# Patient Record
Sex: Female | Born: 1950 | Race: White | Hispanic: No | Marital: Married | Smoking: Never smoker
Health system: Southern US, Community
[De-identification: ages and names within clinical notes are randomized; demographics above are authoritative.]

## PROBLEM LIST (undated history)

## (undated) DIAGNOSIS — Z8781 Personal history of (healed) traumatic fracture: Secondary | ICD-10-CM

## (undated) DIAGNOSIS — F32A Depression, unspecified: Secondary | ICD-10-CM

## (undated) DIAGNOSIS — M1990 Unspecified osteoarthritis, unspecified site: Secondary | ICD-10-CM

## (undated) DIAGNOSIS — G43909 Migraine, unspecified, not intractable, without status migrainosus: Secondary | ICD-10-CM

## (undated) DIAGNOSIS — G4700 Insomnia, unspecified: Secondary | ICD-10-CM

## (undated) DIAGNOSIS — F329 Major depressive disorder, single episode, unspecified: Secondary | ICD-10-CM

## (undated) DIAGNOSIS — C801 Malignant (primary) neoplasm, unspecified: Secondary | ICD-10-CM

## (undated) DIAGNOSIS — M810 Age-related osteoporosis without current pathological fracture: Secondary | ICD-10-CM

## (undated) DIAGNOSIS — F419 Anxiety disorder, unspecified: Secondary | ICD-10-CM

## (undated) DIAGNOSIS — R519 Headache, unspecified: Secondary | ICD-10-CM

## (undated) DIAGNOSIS — R51 Headache: Secondary | ICD-10-CM

## (undated) DIAGNOSIS — R569 Unspecified convulsions: Secondary | ICD-10-CM

## (undated) DIAGNOSIS — S42202A Unspecified fracture of upper end of left humerus, initial encounter for closed fracture: Secondary | ICD-10-CM

## (undated) DIAGNOSIS — K219 Gastro-esophageal reflux disease without esophagitis: Secondary | ICD-10-CM

## (undated) DIAGNOSIS — S42309A Unspecified fracture of shaft of humerus, unspecified arm, initial encounter for closed fracture: Secondary | ICD-10-CM

## (undated) HISTORY — PX: JOINT REPLACEMENT: SHX530

## (undated) HISTORY — PX: TONSILLECTOMY: SUR1361

## (undated) HISTORY — PX: FEMUR FRACTURE SURGERY: SHX633

## (undated) HISTORY — PX: ABDOMINAL HYSTERECTOMY: SHX81

---

## 1988-08-08 DIAGNOSIS — Z8781 Personal history of (healed) traumatic fracture: Secondary | ICD-10-CM

## 1988-08-08 HISTORY — DX: Personal history of (healed) traumatic fracture: Z87.81

## 2015-03-09 HISTORY — PX: ORIF DISTAL RADIUS FRACTURE: SUR927

## 2015-08-01 ENCOUNTER — Emergency Department (HOSPITAL_COMMUNITY): Payer: Managed Care, Other (non HMO)

## 2015-08-01 ENCOUNTER — Encounter (HOSPITAL_COMMUNITY): Payer: Self-pay | Admitting: Emergency Medicine

## 2015-08-01 ENCOUNTER — Emergency Department (HOSPITAL_COMMUNITY)
Admission: EM | Admit: 2015-08-01 | Discharge: 2015-08-01 | Disposition: A | Discharge status: Home / Self Care | Payer: Managed Care, Other (non HMO) | Attending: Emergency Medicine | Admitting: Emergency Medicine

## 2015-08-01 DIAGNOSIS — Z8659 Personal history of other mental and behavioral disorders: Secondary | ICD-10-CM | POA: Insufficient documentation

## 2015-08-01 DIAGNOSIS — S42292A Other displaced fracture of upper end of left humerus, initial encounter for closed fracture: Secondary | ICD-10-CM | POA: Diagnosis not present

## 2015-08-01 DIAGNOSIS — Z8669 Personal history of other diseases of the nervous system and sense organs: Secondary | ICD-10-CM | POA: Insufficient documentation

## 2015-08-01 DIAGNOSIS — W19XXXA Unspecified fall, initial encounter: Secondary | ICD-10-CM

## 2015-08-01 DIAGNOSIS — Y92009 Unspecified place in unspecified non-institutional (private) residence as the place of occurrence of the external cause: Secondary | ICD-10-CM | POA: Diagnosis not present

## 2015-08-01 DIAGNOSIS — Y998 Other external cause status: Secondary | ICD-10-CM | POA: Insufficient documentation

## 2015-08-01 DIAGNOSIS — Y9389 Activity, other specified: Secondary | ICD-10-CM | POA: Diagnosis not present

## 2015-08-01 DIAGNOSIS — S4992XA Unspecified injury of left shoulder and upper arm, initial encounter: Secondary | ICD-10-CM | POA: Diagnosis present

## 2015-08-01 DIAGNOSIS — W01198A Fall on same level from slipping, tripping and stumbling with subsequent striking against other object, initial encounter: Secondary | ICD-10-CM | POA: Insufficient documentation

## 2015-08-01 DIAGNOSIS — S42202A Unspecified fracture of upper end of left humerus, initial encounter for closed fracture: Secondary | ICD-10-CM

## 2015-08-01 HISTORY — DX: Major depressive disorder, single episode, unspecified: F32.9

## 2015-08-01 HISTORY — DX: Migraine, unspecified, not intractable, without status migrainosus: G43.909

## 2015-08-01 HISTORY — DX: Depression, unspecified: F32.A

## 2015-08-01 HISTORY — DX: Age-related osteoporosis without current pathological fracture: M81.0

## 2015-08-01 HISTORY — DX: Insomnia, unspecified: G47.00

## 2015-08-01 MED ORDER — OXYCODONE-ACETAMINOPHEN 5-325 MG PO TABS: 1.0000 | ORAL_TABLET | ORAL | Status: DC | PRN

## 2015-08-01 MED ORDER — OXYCODONE-ACETAMINOPHEN 5-325 MG PO TABS
1.0000 | ORAL_TABLET | Freq: Once | ORAL | Status: AC
  Administered 2015-08-01: 1 via ORAL
  Filled 2015-08-01: qty 1

## 2015-08-01 NOTE — ED Provider Notes (Signed)
CSN: [id number]     Arrival date & time 08/01/15  0541 History   First MD Initiated Contact with Patient 08/01/15 0559     Chief Complaint  Patient presents with  . Shoulder Pain  . Fall     (Consider location/radiation/quality/duration/timing/severity/associated sxs/prior Treatment) Patient is a 64 y.o. female presenting with shoulder pain and fall. The history is provided by the patient.  Shoulder Pain Fall  She states that yesterday, she bumped her left elbow on the door. Later in the day, she fell but does not recall how she fell and does not recall if she hit her arm. She is having pain in her left upper arm. She is complaining of pain which he rates at 10/10. There is a numbness or tingling. She denies other injury.  Past Medical History  Diagnosis Date  . Depression   . Insomnia    No past surgical history on file. No family history on file. Social History  Substance Use Topics  . Smoking status: Not on file  . Smokeless tobacco: Not on file  . Alcohol Use: Not on file   OB History    No data available     Review of Systems  All other systems reviewed and are negative.     Allergies  Review of patient's allergies indicates not on file.  Home Medications   Prior to Admission medications   Not on File   BP 109/69 mmHg  Pulse 76  Temp(Src) 98.6 F (37 C) (Oral)  Resp 15  Ht 5\' 2"  (1.575 m)  Wt 90 lb (40.824 kg)  BMI 16.46 kg/m2  SpO2 100% Physical Exam  Nursing note and vitals reviewed.  Mechele Claude, cachectic 64 year old female, resting comfortably and in no acute distress. Vital signs are normal. Oxygen saturation is 100%, which is normal. Head is normocephalic and atraumatic. PERRLA, EOMI. Oropharynx is clear. Neck is nontender and supple without adenopathy or JVD. Back is nontender and there is no CVA tenderness. Lungs are clear without rales, wheezes, or rhonchi. Chest is nontender. Heart has regular rate and rhythm without  murmur. Abdomen is soft, flat, nontender without masses or hepatosplenomegaly and peristalsis is normoactive. Extremities: Ecchymosis and swelling is present in the right shoulder and proximal humerus. This area is tender to palpation. Distal neurovascular exam is intact with strong pulses, prompt capillary refill, normal sensation. Skin is warm and dry without rash. Neurologic: Mental status is normal, cranial nerves are intact, there are no motor or sensory deficits.  ED Course  Procedures (including critical care time)  Imaging Review Ct Shoulder Left Wo Contrast  08/01/2015  CLINICAL DATA:  Status post fall with a left humerus fracture 08/01/2015. Initial encounter. EXAM: CT OF THE LEFT SHOULDER WITHOUT CONTRAST TECHNIQUE: Multidetector CT imaging was performed according to the standard protocol. Multiplanar CT image reconstructions were also generated. COMPARISON:  Plain films left shoulder earlier today. FINDINGS: The patient has a surgical neck fracture of the left humerus. The fracture is impacted approximately 2 cm and there is 1/2 shaft width anterior displacement. The humeral head is rotated so that the distal fracture margin is oriented almost directly laterally. The fracture is comminuted with nondisplaced components seen in both the greater and lesser tuberosities. No other acute fracture is identified. As visualized by CT scan, the rotator cuff appears intact. 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 The scapula is intact. The humeral head  remains located. Imaged ribs appear normal. Visualized lung parenchyma demonstrates only mild dependent change. Aortic atherosclerosis is noted. IMPRESSION: Surgical neck fracture of the left humerus with impaction and anterior displacement of the humeral shaft. Nondisplaced components of the fracture seen in both the greater and lesser tuberosities. 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 Atherosclerosis. Electronically Signed   By: Inge Rise M.D.   On: 08/01/2015 08:18   Dg Shoulder Left  08/01/2015  CLINICAL DATA:  Acute onset of left shoulder pain and bruising, status post fall. Initial encounter. EXAM: LEFT SHOULDER - 2+ VIEW COMPARISON:  None. FINDINGS: There is a comminuted fracture involving the left humeral head and neck. Left humeral head fragments remain seated at the glenoid fossa. An associated glenohumeral joint effusion is suspected. There appears to be a chronic fracture involving the distal left clavicle, with some degree of bony remodeling. The acromioclavicular joint is unremarkable in appearance. The visualized portions of the left lung are clear. IMPRESSION: 1. Comminuted fracture involving the left humeral head and neck. Left humeral head fragments remain seated at the glenoid fossa. Associated glenohumeral joint effusion suspected. 2. Apparent chronic fracture involving the distal left clavicle, with some degree of bony remodeling. Electronically Signed   By: Garald Balding M.D.   On: 08/01/2015 06:58   I have personally reviewed and evaluated these images and lab results as part of my medical decision-making.  MDM   Final diagnoses:  Fall at home, initial encounter  Traumatic closed displaced fracture of proximal end of left humerus, initial encounter    Left shoulder injury-fracture versus dislocation. She has been sent for x-rays. She has no prior records in the [facility] system.  X-rays show fracture of the surgical neck of the humerus with some displacement. Have discussed case with Dr. Mardelle Matte, on call for orthopedic surgery. He is requesting a CT scan be obtained and he will see her in follow-up in 2 days. She will be placed in a sling for comfort and is given prescription for oxycodone-acetaminophen for pain.  Delora Fuel, MD [phone]

## 2015-08-01 NOTE — ED Notes (Signed)
Patient transported to CT 

## 2015-08-01 NOTE — ED Notes (Addendum)
Patient said she first bump on the door. She said that is started to hurt. Well when she woke up the next day her left shoulder was swollen. Patient states the pain got intense. Patient fell and left shoulder is hurting.

## 2015-08-01 NOTE — Discharge Instructions (Signed)
Shoulder Fracture You have a fractured humerus (bone in the upper arm) at the shoulder just below the ball of the shoulder joint. Most of the time the bones of a broken shoulder are in an acceptable position. Usually the injury can be treated with a shoulder immobilizer or sling and swath bandage. These devices support the arm and prevent any shoulder movement. If the bones are not in a good position, then surgery is sometimes needed. Shoulder fractures usually cause swelling, pain, and discoloration around the upper arm initially. They heal in 8-12 weeks with proper treatment. Rest in bed or a reclining chair as long as your shoulder is very painful. Sitting up generally results in less pain at the fracture site. Do not remove your shoulder bandage until your caregiver approves. You may apply ice packs over the shoulder for 20-30 minutes every 2 hours for the next 2-3 days to reduce the pain and swelling. Use your pain medicine as prescribed.  SEEK IMMEDIATE MEDICAL CARE IF:  You develop severe shoulder pain unrelieved by rest and taking pain medicine.  You have pain, numbness, tingling, or weakness in the hand or wrist.  You develop shortness of breath, chest pain, severe weakness, or fainting.  You have severe pain with motion of the fingers or wrist. MAKE SURE YOU:   Understand these instructions.  Will watch your condition.  Will get help right away if you are not doing well or get worse.   This information is not intended to replace advice given to you by your health care provider. Make sure you discuss any questions you have with your health care provider.   Document Released: 09/01/2004 Document Revised: 10/17/2011 Document Reviewed: 11/12/2008 Elsevier Interactive Patient Education 2016 [employer]  Acetaminophen; Oxycodone tablets What is this medicine? ACETAMINOPHEN; OXYCODONE (a set a MEE noe fen; ox i KOE done) is a pain reliever. It is used to treat moderate to severe  pain. This medicine may be used for other purposes; ask your health care provider or pharmacist if you have questions. What should I tell my health care provider before I take this medicine? They need to know if you have any of these conditions: -brain tumor -Crohn's disease, inflammatory bowel disease, or ulcerative colitis -drug abuse or addiction -head injury -heart or circulation problems -if you often drink alcohol -kidney disease or problems going to the bathroom -liver disease -lung disease, asthma, or breathing problems -an unusual or allergic reaction to acetaminophen, oxycodone, other opioid analgesics, other medicines, foods, dyes, or preservatives -pregnant or trying to get pregnant -breast-feeding How should I use this medicine? Take this medicine by mouth with a full glass of water. Follow the directions on the prescription label. You can take it with or without food. If it upsets your stomach, take it with food. Take your medicine at regular intervals. Do not take it more often than directed. Talk to your pediatrician regarding the use of this medicine in children. Special care may be needed. Patients over 45 years old may have a stronger reaction and need a smaller dose. Overdosage: If you think you have taken too much of this medicine contact a poison control center or emergency room at once. NOTE: This medicine is only for you. Do not share this medicine with others. What if I miss a dose? If you miss a dose, take it as soon as you can. If it is almost time for your next dose, take only that dose. Do not take double or extra  doses. What may interact with this medicine? -alcohol -antihistamines -barbiturates like amobarbital, butalbital, butabarbital, methohexital, pentobarbital, phenobarbital, thiopental, and secobarbital -benztropine -drugs for bladder problems like solifenacin, trospium, oxybutynin, tolterodine, hyoscyamine, and methscopolamine -drugs for breathing  problems like ipratropium and tiotropium -drugs for certain stomach or intestine problems like propantheline, homatropine methylbromide, glycopyrrolate, atropine, belladonna, and dicyclomine -general anesthetics like etomidate, ketamine, nitrous oxide, propofol, desflurane, enflurane, halothane, isoflurane, and sevoflurane -medicines for depression, anxiety, or psychotic disturbances -medicines for sleep -muscle relaxants -naltrexone -narcotic medicines (opiates) for pain -phenothiazines like perphenazine, thioridazine, chlorpromazine, mesoridazine, fluphenazine, prochlorperazine, promazine, and trifluoperazine -scopolamine -tramadol -trihexyphenidyl This list may not describe all possible interactions. Give your health care provider a list of all the medicines, herbs, non-prescription drugs, or dietary supplements you use. Also tell them if you smoke, drink alcohol, or use illegal drugs. Some items may interact with your medicine. What should I watch for while using this medicine? Tell your doctor or health care professional if your pain does not go away, if it gets worse, or if you have new or a different type of pain. You may develop tolerance to the medicine. Tolerance means that you will need a higher dose of the medication for pain relief. Tolerance is normal and is expected if you take this medicine for a long time. Do not suddenly stop taking your medicine because you may develop a severe reaction. Your body becomes used to the medicine. This does NOT mean you are addicted. Addiction is a behavior related to getting and using a drug for a non-medical reason. If you have pain, you have a medical reason to take pain medicine. Your doctor will tell you how much medicine to take. If your doctor wants you to stop the medicine, the dose will be slowly lowered over time to avoid any side effects. You may get drowsy or dizzy. Do not drive, use machinery, or do anything that needs mental alertness  until you know how this medicine affects you. Do not stand or sit up quickly, especially if you are an older patient. This reduces the risk of dizzy or fainting spells. Alcohol may interfere with the effect of this medicine. Avoid alcoholic drinks. There are different types of narcotic medicines (opiates) for pain. If you take more than one type at the same time, you may have more side effects. Give your health care provider a list of all medicines you use. Your doctor will tell you how much medicine to take. Do not take more medicine than directed. Call emergency for help if you have problems breathing. The medicine will cause constipation. Try to have a bowel movement at least every 2 to 3 days. If you do not have a bowel movement for 3 days, call your doctor or health care professional. Do not take Tylenol (acetaminophen) or medicines that have acetaminophen with this medicine. Too much acetaminophen can be very dangerous. Many nonprescription medicines contain acetaminophen. Always read the labels carefully to avoid taking more acetaminophen. What side effects may I notice from receiving this medicine? Side effects that you should report to your doctor or health care professional as soon as possible: -allergic reactions like skin rash, itching or hives, swelling of the face, lips, or tongue -breathing difficulties, wheezing -confusion -light headedness or fainting spells -severe stomach pain -unusually weak or tired -yellowing of the skin or the whites of the eyes Side effects that usually do not require medical attention (report to your doctor or health care professional if they continue or  are bothersome): -dizziness -drowsiness -nausea -vomiting This list may not describe all possible side effects. Call your doctor for medical advice about side effects. You may report side effects to FDA at 1-800-FDA-1088. Where should I keep my medicine? Keep out of the reach of children. This medicine  can be abused. Keep your medicine in a safe place to protect it from theft. Do not share this medicine with anyone. Selling or giving away this medicine is dangerous and against the law. This medicine may cause accidental overdose and death if it taken by other adults, children, or pets. Mix any unused medicine with a substance like cat litter or coffee grounds. Then throw the medicine away in a sealed container like a sealed bag or a coffee can with a lid. Do not use the medicine after the expiration date. Store at room temperature between 20 and 25 degrees C (68 and 77 degrees F). NOTE: This sheet is a summary. It may not cover all possible information. If you have questions about this medicine, talk to your doctor, pharmacist, or health care provider.    2016, Elsevier/Gold Standard. (2014-06-25 15:18:46)

## 2015-08-04 ENCOUNTER — Encounter (HOSPITAL_BASED_OUTPATIENT_CLINIC_OR_DEPARTMENT_OTHER): Payer: Self-pay | Admitting: *Deleted

## 2015-08-06 ENCOUNTER — Encounter (HOSPITAL_COMMUNITY): Payer: Self-pay | Admitting: Emergency Medicine

## 2015-08-07 ENCOUNTER — Encounter (HOSPITAL_BASED_OUTPATIENT_CLINIC_OR_DEPARTMENT_OTHER)
Admission: RE | Disposition: A | Discharge status: Home / Self Care | Payer: Self-pay | Source: Ambulatory Visit | Attending: Orthopedic Surgery

## 2015-08-07 ENCOUNTER — Ambulatory Visit (HOSPITAL_BASED_OUTPATIENT_CLINIC_OR_DEPARTMENT_OTHER): Payer: Managed Care, Other (non HMO) | Admitting: Anesthesiology

## 2015-08-07 ENCOUNTER — Encounter (HOSPITAL_BASED_OUTPATIENT_CLINIC_OR_DEPARTMENT_OTHER): Payer: Self-pay

## 2015-08-07 ENCOUNTER — Ambulatory Visit (HOSPITAL_BASED_OUTPATIENT_CLINIC_OR_DEPARTMENT_OTHER)
Admission: RE | Admit: 2015-08-07 | Discharge: 2015-08-07 | Disposition: A | Discharge status: Home / Self Care | Payer: Managed Care, Other (non HMO) | Source: Ambulatory Visit | Attending: Orthopedic Surgery | Admitting: Orthopedic Surgery

## 2015-08-07 ENCOUNTER — Ambulatory Visit (HOSPITAL_COMMUNITY): Payer: Managed Care, Other (non HMO)

## 2015-08-07 DIAGNOSIS — F418 Other specified anxiety disorders: Secondary | ICD-10-CM | POA: Insufficient documentation

## 2015-08-07 DIAGNOSIS — W19XXXA Unspecified fall, initial encounter: Secondary | ICD-10-CM | POA: Diagnosis not present

## 2015-08-07 DIAGNOSIS — S42309A Unspecified fracture of shaft of humerus, unspecified arm, initial encounter for closed fracture: Secondary | ICD-10-CM

## 2015-08-07 DIAGNOSIS — M1990 Unspecified osteoarthritis, unspecified site: Secondary | ICD-10-CM | POA: Insufficient documentation

## 2015-08-07 DIAGNOSIS — S42202A Unspecified fracture of upper end of left humerus, initial encounter for closed fracture: Secondary | ICD-10-CM | POA: Diagnosis present

## 2015-08-07 DIAGNOSIS — K219 Gastro-esophageal reflux disease without esophagitis: Secondary | ICD-10-CM | POA: Diagnosis not present

## 2015-08-07 HISTORY — DX: Gastro-esophageal reflux disease without esophagitis: K21.9

## 2015-08-07 HISTORY — DX: Headache: R51

## 2015-08-07 HISTORY — DX: Unspecified osteoarthritis, unspecified site: M19.90

## 2015-08-07 HISTORY — DX: Unspecified fracture of shaft of humerus, unspecified arm, initial encounter for closed fracture: S42.309A

## 2015-08-07 HISTORY — DX: Unspecified fracture of upper end of left humerus, initial encounter for closed fracture: S42.202A

## 2015-08-07 HISTORY — DX: Anxiety disorder, unspecified: F41.9

## 2015-08-07 HISTORY — DX: Headache, unspecified: R51.9

## 2015-08-07 HISTORY — PX: ORIF HUMERUS FRACTURE: SHX2126

## 2015-08-07 SURGERY — OPEN REDUCTION INTERNAL FIXATION (ORIF) PROXIMAL HUMERUS FRACTURE
Anesthesia: General | Site: Arm Upper | Laterality: Left

## 2015-08-07 MED ORDER — SENNA-DOCUSATE SODIUM 8.6-50 MG PO TABS: 2.0000 | ORAL_TABLET | Freq: Every day | ORAL | Status: DC

## 2015-08-07 MED ORDER — BUPIVACAINE-EPINEPHRINE (PF) 0.5% -1:200000 IJ SOLN
INTRAMUSCULAR | Status: DC | PRN
  Administered 2015-08-07: 25 mL via PERINEURAL

## 2015-08-07 MED ORDER — GLYCOPYRROLATE 0.2 MG/ML IJ SOLN: 0.2000 mg | Freq: Once | INTRAMUSCULAR | Status: DC | PRN

## 2015-08-07 MED ORDER — CEFAZOLIN SODIUM-DEXTROSE 2-3 GM-% IV SOLR
2.0000 g | INTRAVENOUS | Status: AC
  Administered 2015-08-07: 2 g via INTRAVENOUS

## 2015-08-07 MED ORDER — DEXAMETHASONE SODIUM PHOSPHATE 4 MG/ML IJ SOLN
INTRAMUSCULAR | Status: DC | PRN
  Administered 2015-08-07: 10 mg via INTRAVENOUS

## 2015-08-07 MED ORDER — SUCCINYLCHOLINE CHLORIDE 20 MG/ML IJ SOLN
INTRAMUSCULAR | Status: DC | PRN
  Administered 2015-08-07: 40 mg via INTRAVENOUS

## 2015-08-07 MED ORDER — EPHEDRINE SULFATE 50 MG/ML IJ SOLN
INTRAMUSCULAR | Status: DC | PRN
  Administered 2015-08-07: 10 mg via INTRAVENOUS

## 2015-08-07 MED ORDER — ONDANSETRON HCL 4 MG/2ML IJ SOLN: 4.0000 mg | Freq: Once | INTRAMUSCULAR | Status: DC | PRN

## 2015-08-07 MED ORDER — SCOPOLAMINE 1 MG/3DAYS TD PT72: 1.0000 | MEDICATED_PATCH | Freq: Once | TRANSDERMAL | Status: DC | PRN

## 2015-08-07 MED ORDER — FENTANYL CITRATE (PF) 100 MCG/2ML IJ SOLN
50.0000 ug | INTRAMUSCULAR | Status: DC | PRN
  Administered 2015-08-07 (×2): 50 ug via INTRAVENOUS

## 2015-08-07 MED ORDER — MIDAZOLAM HCL 2 MG/2ML IJ SOLN
1.0000 mg | INTRAMUSCULAR | Status: DC | PRN
  Administered 2015-08-07: 1 mg via INTRAVENOUS

## 2015-08-07 MED ORDER — ONDANSETRON HCL 4 MG PO TABS: 4.0000 mg | ORAL_TABLET | Freq: Three times a day (TID) | ORAL | Status: DC | PRN

## 2015-08-07 MED ORDER — PROPOFOL 10 MG/ML IV BOLUS
INTRAVENOUS | Status: DC | PRN
  Administered 2015-08-07: 120 mg via INTRAVENOUS
  Administered 2015-08-07: 20 mg via INTRAVENOUS

## 2015-08-07 MED ORDER — BACLOFEN 10 MG PO TABS: 10.0000 mg | ORAL_TABLET | Freq: Three times a day (TID) | ORAL | Status: DC

## 2015-08-07 MED ORDER — LIDOCAINE HCL (CARDIAC) 20 MG/ML IV SOLN
INTRAVENOUS | Status: DC | PRN
  Administered 2015-08-07: 40 mg via INTRAVENOUS

## 2015-08-07 MED ORDER — HYDROCODONE-ACETAMINOPHEN 7.5-325 MG PO TABS: 1.0000 | ORAL_TABLET | Freq: Once | ORAL | Status: DC | PRN

## 2015-08-07 MED ORDER — FENTANYL CITRATE (PF) 100 MCG/2ML IJ SOLN: 25.0000 ug | INTRAMUSCULAR | Status: DC | PRN

## 2015-08-07 MED ORDER — ONDANSETRON HCL 4 MG/2ML IJ SOLN
INTRAMUSCULAR | Status: DC | PRN
  Administered 2015-08-07: 4 mg via INTRAVENOUS

## 2015-08-07 MED ORDER — OXYCODONE-ACETAMINOPHEN 10-325 MG PO TABS: 1.0000 | ORAL_TABLET | Freq: Four times a day (QID) | ORAL | Status: DC | PRN

## 2015-08-07 MED ORDER — PHENYLEPHRINE HCL 10 MG/ML IJ SOLN
10.0000 mg | INTRAVENOUS | Status: DC | PRN
  Administered 2015-08-07: 50 ug/min via INTRAVENOUS

## 2015-08-07 MED ORDER — LACTATED RINGERS IV SOLN
INTRAVENOUS | Status: DC
  Administered 2015-08-07: 13:00:00 via INTRAVENOUS

## 2015-08-07 SURGICAL SUPPLY — 80 items
BIT DRILL 3.2 (BIT) ×4
BIT DRILL 3.2XCALB NS DISP (BIT) ×2 IMPLANT
BIT DRILL CALIBRATED 2.7 (BIT) ×2 IMPLANT
BIT DRILL CALIBRATED 2.7MM (BIT) ×1
BIT DRL 3.2XCALB NS DISP (BIT) ×2
BLADE SURG 10 STRL SS (BLADE) ×3 IMPLANT
BLADE SURG 15 STRL LF DISP TIS (BLADE) ×1 IMPLANT
BLADE SURG 15 STRL SS (BLADE) ×2
BNDG GAUZE ELAST 4 BULKY (GAUZE/BANDAGES/DRESSINGS) IMPLANT
CLEANER CAUTERY TIP 5X5 PAD (MISCELLANEOUS) IMPLANT
CLOSURE STERI-STRIP 1/2X4 (GAUZE/BANDAGES/DRESSINGS) ×1
CLSR STERI-STRIP ANTIMIC 1/2X4 (GAUZE/BANDAGES/DRESSINGS) ×2 IMPLANT
DECANTER SPIKE VIAL GLASS SM (MISCELLANEOUS) IMPLANT
DRAPE C-ARM 42X72 X-RAY (DRAPES) ×3 IMPLANT
DRAPE INCISE IOBAN 66X45 STRL (DRAPES) IMPLANT
DRAPE SURG 17X23 STRL (DRAPES) IMPLANT
DRAPE U 20/CS (DRAPES) ×3 IMPLANT
DRAPE U-SHAPE 47X51 STRL (DRAPES) ×3 IMPLANT
DRAPE U-SHAPE 76X120 STRL (DRAPES) ×6 IMPLANT
DRSG MEPILEX BORDER 4X8 (GAUZE/BANDAGES/DRESSINGS) ×3 IMPLANT
DURAPREP 26ML APPLICATOR (WOUND CARE) ×3 IMPLANT
ELECT REM PT RETURN 9FT ADLT (ELECTROSURGICAL) ×3
ELECTRODE REM PT RTRN 9FT ADLT (ELECTROSURGICAL) ×1 IMPLANT
GAUZE SPONGE 4X4 12PLY STRL (GAUZE/BANDAGES/DRESSINGS) ×3 IMPLANT
GAUZE SPONGE 4X4 16PLY XRAY LF (GAUZE/BANDAGES/DRESSINGS) IMPLANT
GAUZE XEROFORM 1X8 LF (GAUZE/BANDAGES/DRESSINGS) IMPLANT
GLOVE BIO SURGEON STRL SZ 6.5 (GLOVE) ×2 IMPLANT
GLOVE BIO SURGEON STRL SZ8 (GLOVE) ×3 IMPLANT
GLOVE BIO SURGEONS STRL SZ 6.5 (GLOVE) ×1
GLOVE BIOGEL PI IND STRL 7.0 (GLOVE) ×3 IMPLANT
GLOVE BIOGEL PI IND STRL 8 (GLOVE) ×2 IMPLANT
GLOVE BIOGEL PI INDICATOR 7.0 (GLOVE) ×6
GLOVE BIOGEL PI INDICATOR 8 (GLOVE) ×4
GLOVE ECLIPSE 6.5 STRL STRAW (GLOVE) ×6 IMPLANT
GLOVE ORTHO TXT STRL SZ7.5 (GLOVE) ×3 IMPLANT
GOWN STRL REUS W/ TWL LRG LVL3 (GOWN DISPOSABLE) ×1 IMPLANT
GOWN STRL REUS W/ TWL XL LVL3 (GOWN DISPOSABLE) ×3 IMPLANT
GOWN STRL REUS W/TWL LRG LVL3 (GOWN DISPOSABLE) ×2
GOWN STRL REUS W/TWL XL LVL3 (GOWN DISPOSABLE) ×6
K-WIRE 2X5 SS THRDED S3 (WIRE) ×6
KWIRE 2X5 SS THRDED S3 (WIRE) ×2 IMPLANT
NS IRRIG 1000ML POUR BTL (IV SOLUTION) ×3 IMPLANT
PACK ARTHROSCOPY DSU (CUSTOM PROCEDURE TRAY) ×3 IMPLANT
PACK BASIN DAY SURGERY FS (CUSTOM PROCEDURE TRAY) ×3 IMPLANT
PAD CLEANER CAUTERY TIP 5X5 (MISCELLANEOUS)
PEG LOCKING 3.2X34 (Screw) ×6 IMPLANT
PEG LOCKING 3.2X36 (Screw) ×3 IMPLANT
PEG LOCKING 3.2X40 (Peg) ×3 IMPLANT
PENCIL BUTTON HOLSTER BLD 10FT (ELECTRODE) ×3 IMPLANT
PLATE HUMERUS LP PROX L 3H (Plate) ×3 IMPLANT
SCREW LOCK CORT STAR 3.5X40 (Screw) ×3 IMPLANT
SCREW LP NL T15 3.5X24 (Screw) ×3 IMPLANT
SCREW PEG LOCK 3.2X30MM (Screw) ×6 IMPLANT
SLEEVE MEASURING 3.2 (BIT) ×3 IMPLANT
SLEEVE SCD COMPRESS KNEE MED (MISCELLANEOUS) ×3 IMPLANT
SLING ARM FOAM STRAP LRG (SOFTGOODS) IMPLANT
SLING ARM IMMOBILIZER LRG (SOFTGOODS) IMPLANT
SLING ARM IMMOBILIZER MED (SOFTGOODS) IMPLANT
SLING ARM MED ADULT FOAM STRAP (SOFTGOODS) IMPLANT
SLING ARM XL FOAM STRAP (SOFTGOODS) IMPLANT
SPONGE LAP 18X18 X RAY DECT (DISPOSABLE) ×6 IMPLANT
SUCTION FRAZIER HANDLE 10FR (MISCELLANEOUS) ×2
SUCTION TUBE FRAZIER 10FR DISP (MISCELLANEOUS) ×1 IMPLANT
SUPPORT WRAP ARM LG (MISCELLANEOUS) ×3 IMPLANT
SUT FIBERWIRE #2 38 T-5 BLUE (SUTURE) ×6
SUT MNCRL AB 4-0 PS2 18 (SUTURE) IMPLANT
SUT VIC AB 0 CT1 18XCR BRD 8 (SUTURE) IMPLANT
SUT VIC AB 0 CT1 27 (SUTURE) ×2
SUT VIC AB 0 CT1 27XBRD ANBCTR (SUTURE) ×1 IMPLANT
SUT VIC AB 0 CT1 8-18 (SUTURE)
SUT VIC AB 2-0 SH 27 (SUTURE)
SUT VIC AB 2-0 SH 27XBRD (SUTURE) IMPLANT
SUT VICRYL 3-0 CR8 SH (SUTURE) ×3 IMPLANT
SUTURE FIBERWR #2 38 T-5 BLUE (SUTURE) ×2 IMPLANT
SYR BULB 3OZ (MISCELLANEOUS) IMPLANT
SYR BULB IRRIGATION 50ML (SYRINGE) ×3 IMPLANT
TAPE STRIPS DRAPE STRL (GAUZE/BANDAGES/DRESSINGS) IMPLANT
TOWEL OR 17X24 6PK STRL BLUE (TOWEL DISPOSABLE) ×3 IMPLANT
TOWEL OR NON WOVEN STRL DISP B (DISPOSABLE) ×3 IMPLANT
YANKAUER SUCT BULB TIP NO VENT (SUCTIONS) ×3 IMPLANT

## 2015-08-07 NOTE — Anesthesia Preprocedure Evaluation (Addendum)
Anesthesia Evaluation  Patient identified by MRN, date of birth, ID band Patient awake    Reviewed: Allergy & Precautions, H&P , NPO status , Patient's Chart, lab work & pertinent test results  History of Anesthesia Complications Negative for: history of anesthetic complications  Airway Mallampati: I  TM Distance: >3 FB Neck ROM: full    Dental  (+) Partial Upper, Missing   Pulmonary neg pulmonary ROS,    Pulmonary exam normal breath sounds clear to auscultation       Cardiovascular negative cardio ROS Normal cardiovascular exam Rhythm:regular Rate:Normal     Neuro/Psych  Headaches, Anxiety Depression    GI/Hepatic Neg liver ROS, GERD  ,  Endo/Other  negative endocrine ROS  Renal/GU negative Renal ROS     Musculoskeletal  (+) Arthritis ,   Abdominal   Peds  Hematology negative hematology ROS (+)   Anesthesia Other Findings   Reproductive/Obstetrics negative OB ROS                             Anesthesia Physical Anesthesia Plan  ASA: II  Anesthesia Plan: General and Regional   Post-op Pain Management: GA combined w/ Regional for post-op pain   Induction: Intravenous  Airway Management Planned: Oral ETT  Additional Equipment: None  Intra-op Plan:   Post-operative Plan: Extubation in OR  Informed Consent: I have reviewed the patients History and Physical, chart, labs and discussed the procedure including the risks, benefits and alternatives for the proposed anesthesia with the patient or authorized representative who has indicated his/her understanding and acceptance.   Dental Advisory Given  Plan Discussed with: Anesthesiologist, CRNA and Surgeon  Anesthesia Plan Comments:         Anesthesia Quick Evaluation

## 2015-08-07 NOTE — Op Note (Signed)
08/07/2015  5:22 PM  PATIENT:  Samantha Atkins    PRE-OPERATIVE DIAGNOSIS:  Left proximal humerus fracture  Postoperative diagnosis: Same  Operative procedure: Open reduction internal fixation left proximal humerus fracture   SURGEON:  Johnny Bridge, MD  PHYSICIAN ASSISTANT: Joya Gaskins, OPA-C, present and scrubbed throughout the case, critical for completion in a timely fashion, and for retraction, instrumentation, and closure.  Second assistant: Morley Kos, OPA-C   ANESTHESIA:   General  PREOPERATIVE INDICATIONS:  Samantha Atkins is a  64 y.o. female with  known osteoporosis who fell and had a left proximal humerus fracture with displacement and elected for surgical management.   The risks benefits and alternatives were discussed with the patient including but not limited to the risks of nonoperative treatment, versus surgical intervention including infection, bleeding, nerve injury, malunion, nonunion, the need for revision surgery, hardware prominence, hardware failure, the need for hardware removal, blood clots, cardiopulmonary complications, conversion to arthroplasty, morbidity, mortality, among others, and they were willing to proceed.  Predicted outcome is good, although there will be at least a six to nine month expected recovery.   OPERATIVE IMPLANTS: Biomet ALPS proximal humerus locking plate with 1 distal nonlocking screw, one distal locking screw, and a total of 6 proximal smooth locking pegs.  OPERATIVE FINDINGS: Displaced proximal humerus fracture.With osteoporosis and displacement  UNIQUE ASPECTS OF THE CASE:  she was extremely small, and reduction of the head was somewhat challenging, but ultimately able to be achieved. There was a little comminution posteriorly, which I was not able to completely get fixation with, except for suture through the tuberosity.  OPERATIVE PROCEDURE: The patient was brought to the operating room and placed in the supine position. General  anesthesia was administered. IV antibiotics were given. She was placed in the beach chair position. All bony prominences were padded. The upper extremity was prepped and draped in usual sterile fashion. Deltopectoral incision was performed.  I exposed the fracture site, and placed deep retractors. I did not tenotomize the biceps tendon. This was left in place. I placed supraspinatus and subscapularis stitches, and then reduced the head onto the shaft. This was maintained in satisfactory position.  I applied the plate and secured it into the sliding hole first. I confirmed position of the reduction and the plate with C-arm, and I placed a total of 2 guidewires into the appropriate position in the head. I was satisfied that the plate was distal appropriately, and then secured the plate proximally with smooth pegs, taking care to prevent penetration into the articular surface, using C-arm, as well as manual feel using a hand drill.  I then secured the plate distally using another locking screw. I then passed the FiberWire sutures from the subscapularis and supraspinatus through the plate and secured the tuberosities. Once complete fixation and reduction of been achieved, took final C-arm pictures, and irrigated the wounds copiously, and repaired the deltopectoral interval with Vicryl followed by Vicryl for the subcutaneous tissue with Monocryl and Steri-Strips for the skin. She was placed in a sling. She had a preoperative regional block as well. She tolerated the procedure well with no complications.

## 2015-08-07 NOTE — Anesthesia Procedure Notes (Addendum)
Anesthesia Regional Block:  Interscalene brachial plexus block  Pre-Anesthetic Checklist: ,, timeout performed, Correct Patient, Correct Site, Correct Laterality, Correct Procedure, Correct Position, site marked, Risks and benefits discussed,  Surgical consent,  Pre-op evaluation,  At surgeon's request and post-op pain management  Laterality: Left  Prep: chloraprep       Needles:  Injection technique: Single-shot  Needle Type: Echogenic Stimulator Needle     Needle Length: 5cm 5 cm Needle Gauge: 21 and 21 G    Additional Needles:  Procedures: ultrasound guided (picture in chart) Interscalene brachial plexus block Narrative:  Injection made incrementally with aspirations every 5 mL.  Performed by: Personally  Anesthesiologist: JUDD, BENJAMIN  Additional Notes: Risks, benefits and alternative to block explained extensively.  Patient tolerated procedure well, without complications.   Procedure Name: Intubation Performed by: Terrance Mass Pre-anesthesia Checklist: Patient identified, Emergency Drugs available, Suction available and Patient being monitored Patient Re-evaluated:Patient Re-evaluated prior to inductionOxygen Delivery Method: Circle System Utilized Preoxygenation: Pre-oxygenation with 100% oxygen Intubation Type: IV induction Ventilation: Mask ventilation without difficulty Laryngoscope Size: Miller and 2 Tube type: Oral Tube size: 7.0 mm Number of attempts: 1 Airway Equipment and Method: Stylet and Oral airway Placement Confirmation: ETT inserted through vocal cords under direct vision,  positive ETCO2 and breath sounds checked- equal and bilateral Secured at: 23 cm Tube secured with: Tape Dental Injury: Teeth and Oropharynx as per pre-operative assessment

## 2015-08-07 NOTE — Progress Notes (Signed)
Assisted Dr. Veatrice Kells with left, ultrasound guided, interscalene  block. Side rails up, monitors on throughout procedure. See vital signs in flow sheet. Tolerated Procedure well.

## 2015-08-07 NOTE — H&P (Signed)
PREOPERATIVE H&P  Chief Complaint: Left proximal humerus fracture  HPI: Samantha Atkins is a 64 y.o. female who presents for preoperative history and physical with a diagnosis of left proximal humerus fracture . Symptoms are rated as moderate to severe, and have been worsening.  This is significantly impairing activities of daily living.  She has elected for surgical management. She had a fall this summer 24th 2016, and has had persistent 10/10 pain and bruising over the left shoulder. She is right-hand dominant. She's had multiple previous fractures and has known osteoporosis and is taking Fosamax.  Past Medical History  Diagnosis Date  . Insomnia   . Migraines   . Osteoporosis   . Depression   . Anxiety   . GERD (gastroesophageal reflux disease)   . Headache   . Arthritis     hands  . Fracture, humerus closed 07-31-15 fell    left   Past Surgical History  Procedure Laterality Date  . Abdominal hysterectomy    . Tonsillectomy    . Joint replacement Bilateral     hip  . Femur fracture surgery Left     fell, redo hip replacement  . Orif distal radius fracture Left 03-2015   Social History   Social History  . Marital Status: Married    Spouse Name: N/A  . Number of Children: N/A  . Years of Education: N/A   Social History Main Topics  . Smoking status: Never Smoker   . Smokeless tobacco: None  . Alcohol Use: No  . Drug Use: No  . Sexual Activity: Not Asked   Other Topics Concern  . None   Social History Narrative   ** Merged History Encounter **       History reviewed. No pertinent family history. Allergies  Allergen Reactions  . Dust Mite Extract Cough   Prior to Admission medications   Medication Sig Start Date End Date Taking? Authorizing Provider  butalbital-acetaminophen-caffeine (FIORICET, ESGIC) 50-325-40 MG tablet Take by mouth 2 (two) times daily as needed for headache.   Yes Historical Provider, MD  estradiol (ESTRACE) 1 MG tablet Take 1 mg by mouth  daily.   Yes Historical Provider, MD  FLUoxetine (PROZAC) 20 MG capsule Take 60 mg by mouth daily.   Yes Historical Provider, MD  OLANZapine (ZYPREXA) 15 MG tablet Take 15 mg by mouth at bedtime.   Yes Historical Provider, MD  omeprazole (PRILOSEC) 20 MG capsule Take 20 mg by mouth daily.   Yes Historical Provider, MD  oxyCODONE-acetaminophen (PERCOCET/ROXICET) 5-325 MG tablet Take by mouth every 4 (four) hours as needed for severe pain.   Yes Historical Provider, MD  oxyCODONE-acetaminophen (PERCOCET) 5-325 MG tablet Take 1-2 tablets by mouth every 4 (four) hours as needed for moderate pain. 0000000   Delora Fuel, MD     Positive ROS: All other systems have been reviewed and were otherwise negative with the exception of those mentioned in the HPI and as above.  Physical Exam: General: Alert, no acute distress Cardiovascular: No pedal edema Respiratory: No cyanosis, no use of accessory musculature GI: No organomegaly, abdomen is soft and non-tender Skin: No lesions in the area of chief complaint Neurologic: Sensation intact distally Psychiatric: Patient is competent for consent with normal mood and affect Lymphatic: No axillary or cervical lymphadenopathy  MUSCULOSKELETAL: Left shoulder has positive ecchymosis and sensation intact throughout the hand and all fingers flex extend and abduct. Mild deformity over the shoulder.  Assessment: Acute displaced left proximal humerus fracture, in  a very frail osteoporotic 64 year old woman.   Plan: Plan for Procedure(s): LEFT OPEN REDUCTION INTERNAL FIXATION (ORIF) PROXIMAL HUMERUS FRACTURE  The risks benefits and alternatives were discussed with the patient including but not limited to the risks of nonoperative treatment, versus surgical intervention including infection, bleeding, nerve injury, malunion, nonunion, the need for revision surgery, hardware prominence, hardware failure, the need for hardware removal, blood clots, cardiopulmonary  complications, morbidity, mortality, among others, and they were willing to proceed.     Johnny Bridge, MD Cell [phone]   08/07/2015 11:14 AM

## 2015-08-07 NOTE — Discharge Instructions (Signed)
Diet: As you were doing prior to hospitalization  ° °Shower:  May shower but keep the wounds dry, use an occlusive plastic wrap, NO SOAKING IN TUB.  If the bandage gets wet, change with a clean dry gauze.  If you have a splint on, leave the splint in place and keep the splint dry with a plastic bag. ° °Dressing:  You may change your dressing 3-5 days after surgery, unless you have a splint.  If you have a splint, then just leave the splint in place and we will change your bandages during your first follow-up appointment.   ° °If you had hand or foot surgery, we will plan to remove your stitches in about 2 weeks in the office.  For all other surgeries, there are sticky tapes (steri-strips) on your wounds and all the stitches are absorbable.  Leave the steri-strips in place when changing your dressings, they will peel off with time, usually 2-3 weeks. ° °Activity:  Increase activity slowly as tolerated, but follow the weight bearing instructions below.  The rules on driving is that you can not be taking narcotics while you drive, and you must feel in control of the vehicle.   ° °Weight Bearing:   Sling at all times..   ° °To prevent constipation: you may use a stool softener such as - ° °Colace (over the counter) 100 mg by mouth twice a day  °Drink plenty of fluids (prune juice may be helpful) and high fiber foods °Miralax (over the counter) for constipation as needed.   ° °Itching:  If you experience itching with your medications, try taking only a single pain pill, or even half a pain pill at a time.  You may take up to 10 pain pills per day, and you can also use benadryl over the counter for itching or also to help with sleep.  ° °Precautions:  If you experience chest pain or shortness of breath - call 911 immediately for transfer to the hospital emergency department!! ° °If you develop a fever greater that 101 F, purulent drainage from wound, increased redness or drainage from wound, or calf pain -- Call the  office at [phone]                                                °Follow- Up Appointment:  Please call for an appointment to be seen in 2 weeks [place] - [phone] ° °Regional Anesthesia Blocks ° °1. Numbness or the inability to move the "blocked" extremity may last from 3-48 hours after placement. The length of time depends on the medication injected and your individual response to the medication. If the numbness is not going away after 48 hours, call your surgeon. ° °2. The extremity that is blocked will need to be protected until the numbness is gone and the  Strength has returned. Because you cannot feel it, you will need to take extra care to avoid injury. Because it may be weak, you may have difficulty moving it or using it. You may not know what position it is in without looking at it while the block is in effect. ° °3. For blocks in the legs and feet, returning to weight bearing and walking needs to be done carefully. You will need to wait until the numbness is entirely gone and the strength has returned. You should be   able to move your leg and foot normally before you try and bear weight or walk. You will need someone to be with you when you first try to ensure you do not fall and possibly risk injury. ° °4. Bruising and tenderness at the needle site are common side effects and will resolve in a few days. ° °5. Persistent numbness or new problems with movement should be communicated to the surgeon or the [facility] ([phone])/ [facility] ([phone]). ° °Post Anesthesia Home Care Instructions ° °Activity: °Get plenty of rest for the remainder of the day. A responsible adult should stay with you for 24 hours following the procedure.  °For the next 24 hours, DO NOT: °-Drive a car °-Operate machinery °-Drink alcoholic beverages °-Take any medication unless instructed by your physician °-Make any legal decisions or sign important papers. ° °Meals: °Start with liquid  foods such as gelatin or soup. Progress to regular foods as tolerated. Avoid greasy, spicy, heavy foods. If nausea and/or vomiting occur, drink only clear liquids until the nausea and/or vomiting subsides. Call your physician if vomiting continues. ° °Special Instructions/Symptoms: °Your throat may feel dry or sore from the anesthesia or the breathing tube placed in your throat during surgery. If this causes discomfort, gargle with warm salt water. The discomfort should disappear within 24 hours. ° °If you had a scopolamine patch placed behind your ear for the management of post- operative nausea and/or vomiting: ° °1. The medication in the patch is effective for 72 hours, after which it should be removed.  Wrap patch in a tissue and discard in the trash. Wash hands thoroughly with soap and water. °2. You may remove the patch earlier than 72 hours if you experience unpleasant side effects which may include dry mouth, dizziness or visual disturbances. °3. Avoid touching the patch. Wash your hands with soap and water after contact with the patch. °  ° ° ° ° °

## 2015-08-07 NOTE — Transfer of Care (Signed)
Immediate Anesthesia Transfer of Care Note  Patient: Samantha Atkins  Procedure(s) Performed: Procedure(s): LEFT OPEN REDUCTION INTERNAL FIXATION (ORIF) PROXIMAL HUMERUS FRACTURE (Left)  Patient Location: PACU  Anesthesia Type:General  Level of Consciousness: awake, alert  and oriented  Airway & Oxygen Therapy: Patient Spontanous Breathing and Patient connected to face mask oxygen  Post-op Assessment: Report given to RN and Post -op Vital signs reviewed and stable  Post vital signs: Reviewed and stable  Last Vitals:  Filed Vitals:   08/07/15 1400 08/07/15 1405  BP: 126/67   Pulse: 89 95  Temp:    Resp: 19 21    Complications: No apparent anesthesia complications

## 2015-08-07 NOTE — Anesthesia Postprocedure Evaluation (Signed)
Anesthesia Post Note  Patient: Samantha Atkins  Procedure(s) Performed: Procedure(s) (LRB): LEFT OPEN REDUCTION INTERNAL FIXATION (ORIF) PROXIMAL HUMERUS FRACTURE (Left)  Patient location during evaluation: PACU Anesthesia Type: General Level of consciousness: awake and alert Pain management: pain level controlled Vital Signs Assessment: post-procedure vital signs reviewed and stable Respiratory status: spontaneous breathing, nonlabored ventilation and respiratory function stable Cardiovascular status: blood pressure returned to baseline and stable Postop Assessment: no signs of nausea or vomiting Anesthetic complications: no    Last Vitals:  Filed Vitals:   08/07/15 1734 08/07/15 1735  BP: 115/79   Pulse:    Temp: 37.3 C   Resp: 16 16    Last Pain:  Filed Vitals:   08/07/15 1743  PainSc: 0-No pain                 Zenaida Deed

## 2015-08-11 ENCOUNTER — Encounter (HOSPITAL_BASED_OUTPATIENT_CLINIC_OR_DEPARTMENT_OTHER): Payer: Self-pay | Admitting: Orthopedic Surgery

## 2015-12-02 ENCOUNTER — Encounter (HOSPITAL_COMMUNITY): Payer: Self-pay | Admitting: Emergency Medicine

## 2015-12-02 ENCOUNTER — Emergency Department (HOSPITAL_COMMUNITY)
Admission: EM | Admit: 2015-12-02 | Discharge: 2015-12-02 | Disposition: A | Discharge status: Home / Self Care | Payer: Managed Care, Other (non HMO) | Attending: Emergency Medicine | Admitting: Emergency Medicine

## 2015-12-02 ENCOUNTER — Emergency Department (HOSPITAL_COMMUNITY): Payer: Managed Care, Other (non HMO)

## 2015-12-02 DIAGNOSIS — F329 Major depressive disorder, single episode, unspecified: Secondary | ICD-10-CM | POA: Diagnosis not present

## 2015-12-02 DIAGNOSIS — S12101A Unspecified nondisplaced fracture of second cervical vertebra, initial encounter for closed fracture: Secondary | ICD-10-CM | POA: Diagnosis not present

## 2015-12-02 DIAGNOSIS — Y998 Other external cause status: Secondary | ICD-10-CM | POA: Diagnosis not present

## 2015-12-02 DIAGNOSIS — S0101XA Laceration without foreign body of scalp, initial encounter: Secondary | ICD-10-CM

## 2015-12-02 DIAGNOSIS — F419 Anxiety disorder, unspecified: Secondary | ICD-10-CM | POA: Insufficient documentation

## 2015-12-02 DIAGNOSIS — K219 Gastro-esophageal reflux disease without esophagitis: Secondary | ICD-10-CM | POA: Diagnosis not present

## 2015-12-02 DIAGNOSIS — Z8739 Personal history of other diseases of the musculoskeletal system and connective tissue: Secondary | ICD-10-CM | POA: Insufficient documentation

## 2015-12-02 DIAGNOSIS — Y9289 Other specified places as the place of occurrence of the external cause: Secondary | ICD-10-CM | POA: Insufficient documentation

## 2015-12-02 DIAGNOSIS — Y9389 Activity, other specified: Secondary | ICD-10-CM | POA: Diagnosis not present

## 2015-12-02 DIAGNOSIS — S0990XA Unspecified injury of head, initial encounter: Secondary | ICD-10-CM | POA: Diagnosis not present

## 2015-12-02 DIAGNOSIS — Z8679 Personal history of other diseases of the circulatory system: Secondary | ICD-10-CM | POA: Insufficient documentation

## 2015-12-02 DIAGNOSIS — S199XXA Unspecified injury of neck, initial encounter: Secondary | ICD-10-CM | POA: Diagnosis present

## 2015-12-02 DIAGNOSIS — Z79899 Other long term (current) drug therapy: Secondary | ICD-10-CM | POA: Insufficient documentation

## 2015-12-02 DIAGNOSIS — W01198A Fall on same level from slipping, tripping and stumbling with subsequent striking against other object, initial encounter: Secondary | ICD-10-CM | POA: Diagnosis not present

## 2015-12-02 DIAGNOSIS — W19XXXA Unspecified fall, initial encounter: Secondary | ICD-10-CM

## 2015-12-02 DIAGNOSIS — G4700 Insomnia, unspecified: Secondary | ICD-10-CM | POA: Diagnosis not present

## 2015-12-02 LAB — BASIC METABOLIC PANEL
ANION GAP: 10 (ref 5–15)
BUN: 14 mg/dL (ref 6–20)
CALCIUM: 9 mg/dL (ref 8.9–10.3)
CHLORIDE: 103 mmol/L (ref 101–111)
CO2: 31 mmol/L (ref 22–32)
CREATININE: 0.55 mg/dL (ref 0.44–1.00)
GFR calc non Af Amer: >60 mL/min (ref >60)
GLUCOSE: 110 mg/dL — AB (ref 65–99)
Potassium: 2.7 mmol/L — CL (ref 3.5–5.1)
Sodium: 144 mmol/L (ref 135–145)

## 2015-12-02 LAB — CBC WITH DIFFERENTIAL/PLATELET
BASOS PCT: 0 %
Basophils Absolute: 0 10*3/uL (ref 0.0–0.1)
Eosinophils Absolute: 0.1 10*3/uL (ref 0.0–0.7)
Eosinophils Relative: 1 %
HEMATOCRIT: 37.5 % (ref 36.0–46.0)
HEMOGLOBIN: 12.8 g/dL (ref 12.0–15.0)
LYMPHS ABS: 1.8 10*3/uL (ref 0.7–4.0)
Lymphocytes Relative: 32 %
MCH: 32.9 pg (ref 26.0–34.0)
MCHC: 34.1 g/dL (ref 30.0–36.0)
MCV: 96.4 fL (ref 78.0–100.0)
MONO ABS: 0.5 10*3/uL (ref 0.1–1.0)
MONOS PCT: 10 %
NEUTROS ABS: 3.2 10*3/uL (ref 1.7–7.7)
NEUTROS PCT: 57 %
Platelets: 255 10*3/uL (ref 150–400)
RBC: 3.89 MIL/uL (ref 3.87–5.11)
RDW: 14 % (ref 11.5–15.5)
WBC: 5.5 10*3/uL (ref 4.0–10.5)

## 2015-12-02 MED ORDER — OXYCODONE-ACETAMINOPHEN 5-325 MG PO TABS
2.0000 | ORAL_TABLET | Freq: Once | ORAL | Status: AC
  Administered 2015-12-02: 2 via ORAL
  Filled 2015-12-02: qty 2

## 2015-12-02 NOTE — ED Notes (Signed)
Pts husband found patient in the recliner with bleeding from the head. Pt slow to speech states she just fell on the floor. Head laceration noted bleeding controlled.

## 2015-12-02 NOTE — ED Notes (Signed)
Ulice Dash from lab critical potassium 2.7

## 2015-12-02 NOTE — Discharge Instructions (Signed)
You have a fracture of cervical vertebrae #2.   I spoke to the neurosurgeon. He did not think this fracture will require surgery. He recommended a firm collar. He wants to see you in 1 week. Call in the morning for an appointment. Staples out next Thursday.

## 2015-12-02 NOTE — ED Notes (Signed)
Bed: WA16 Expected date:  Expected time:  Means of arrival:  Comments: Triage 3 

## 2015-12-02 NOTE — ED Notes (Signed)
Verbalized understanding discharge instructions and follow-up. In no acute distress.   

## 2015-12-02 NOTE — ED Provider Notes (Addendum)
CSN: [id number]     Arrival date & time 12/02/15  1246 History   First MD Initiated Contact with Patient 12/02/15 1335     Chief Complaint  Patient presents with  . Fall  . Head Laceration     (Consider location/radiation/quality/duration/timing/severity/associated sxs/prior Treatment) Patient is a 65 y.o. female presenting with fall and scalp laceration.  Fall  Head Laceration  Level V caveat for urgent need for intervention Accidental trip and fall this afternoon striking left parietal scalp. No loss of consciousness or neurological deficits. Patient has had multiple fractures secondary to severe osteoporosis. She also complains of neck pain. Past Medical History  Diagnosis Date  . Insomnia   . Migraines   . Osteoporosis   . Depression   . Anxiety   . GERD (gastroesophageal reflux disease)   . Headache   . Arthritis     hands  . Fracture, humerus closed 07-31-15 fell    left  . Fracture of humerus, proximal, left, closed 08/07/2015   Past Surgical History  Procedure Laterality Date  . Abdominal hysterectomy    . Tonsillectomy    . Joint replacement Bilateral     hip  . Femur fracture surgery Left     fell, redo hip replacement  . Orif distal radius fracture Left 03-2015  . Orif humerus fracture Left 08/07/2015    Procedure: LEFT OPEN REDUCTION INTERNAL FIXATION (ORIF) PROXIMAL HUMERUS FRACTURE;  Surgeon: Marchia Bond, MD;  Location: [place];  Service: Orthopedics;  Laterality: Left;   No family history on file. Social History  Substance Use Topics  . Smoking status: Never Smoker   . Smokeless tobacco: None  . Alcohol Use: No   OB History    Gravida Para Term Preterm AB TAB SAB Ectopic Multiple Living   0 0 0 0 0 0 0 0       Review of Systems  Reason unable to perform ROS: urgent need for intervention.      Allergies  Dust mite extract  Home Medications   Prior to Admission medications   Medication Sig Start Date End Date Taking?  Authorizing Provider  butalbital-acetaminophen-caffeine (FIORICET, ESGIC) 50-325-40 MG tablet Take 1 tablet by mouth 2 (two) times daily as needed for headache.    Yes Historical Provider, MD  estradiol (ESTRACE) 1 MG tablet Take 1 mg by mouth daily.   Yes Historical Provider, MD  FLUoxetine (PROZAC) 20 MG capsule Take 60 mg by mouth daily.   Yes Historical Provider, MD  HYDROcodone-acetaminophen (NORCO/VICODIN) 5-325 MG tablet Take 1 tablet by mouth every 6 (six) hours as needed for moderate pain.  11/26/15  Yes Historical Provider, MD  OLANZapine (ZYPREXA) 15 MG tablet Take 15 mg by mouth at bedtime. Reported on 12/02/2015   Yes Historical Provider, MD  omeprazole (PRILOSEC) 20 MG capsule Take 20 mg by mouth daily.   Yes Historical Provider, MD  oxyCODONE-acetaminophen (PERCOCET) 10-325 MG tablet Take 1-2 tablets by mouth every 6 (six) hours as needed for pain. MAXIMUM TOTAL ACETAMINOPHEN DOSE IS 4000 MG PER DAY 08/07/15  Yes Marchia Bond, MD  baclofen (LIORESAL) 10 MG tablet Take 1 tablet (10 mg total) by mouth 3 (three) times daily. As needed for muscle spasm Patient not taking: Reported on 12/02/2015 08/07/15   Marchia Bond, MD  ondansetron (ZOFRAN) 4 MG tablet Take 1 tablet (4 mg total) by mouth every 8 (eight) hours as needed for nausea or vomiting. Patient not taking: Reported on 12/02/2015 08/07/15  Marchia Bond, MD  sennosides-docusate sodium (SENOKOT-S) 8.6-50 MG tablet Take 2 tablets by mouth daily. Patient not taking: Reported on 12/02/2015 08/07/15   Marchia Bond, MD   BP 128/74 mmHg  Pulse 38  Temp(Src) 98.7 F (37.1 C) (Oral)  Resp 16  Ht 5\' 2"  (1.575 m)  Wt 85 lb (38.556 kg)  BMI 15.54 kg/m2  SpO2 96% Physical Exam  Constitutional: She is oriented to person, place, and time.  Thin, in pain  HENT:  Head: Normocephalic.  2.5 cm laceration on the left parietal scalp  Eyes: Conjunctivae and EOM are normal. Pupils are equal, round, and reactive to light.  Neck: Normal range  of motion. Neck supple.  Tender upper cervical spine  Cardiovascular: Normal rate and regular rhythm.   Pulmonary/Chest: Effort normal and breath sounds normal.  Abdominal: Soft. Bowel sounds are normal.  Musculoskeletal: Normal range of motion.  Neurological: She is alert and oriented to person, place, and time.  Skin: Skin is warm and dry.  Psychiatric: She has a normal mood and affect. Her behavior is normal.  Nursing note and vitals reviewed.   ED Course  .Marland KitchenLaceration Repair Date/Time: 12/02/2015 2:30 PM Performed by: Nat Christen Authorized by: Nat Christen Consent: Verbal consent obtained. Risks and benefits: risks, benefits and alternatives were discussed Consent given by: patient Comments: 2.5 cm scalp laceration left parietal area: Hair trimmed from wound. Wound rinsed with normal saline. No foreign body. Staples 4. Patient tolerated procedure well.   (including critical care time) Labs Review Labs Reviewed  BASIC METABOLIC PANEL  CBC WITH DIFFERENTIAL/PLATELET    Imaging Review Ct Head Wo Contrast  12/02/2015  CLINICAL DATA:  Found bleeding from the head. Fell to the floor. Unwitnessed. EXAM: CT HEAD WITHOUT CONTRAST CT CERVICAL SPINE WITHOUT CONTRAST TECHNIQUE: Multidetector CT imaging of the head and cervical spine was performed following the standard protocol without intravenous contrast. Multiplanar CT image reconstructions of the cervical spine were also generated. COMPARISON:  None. FINDINGS: CT HEAD FINDINGS There is mild generalized brain atrophy. No evidence of old or acute focal infarction, mass lesion, hemorrhage, hydrocephalus or extra-axial collection. Left parietal vertex scalp laceration. No underlying skull fracture. No fluid in the sinuses, middle ears or mastoids. There is atherosclerotic calcification of the major vessels at the base of the brain. CT CERVICAL SPINE FINDINGS Skullbase and C1 are intact. There are hangman's fractures of the C2 vertebral body  affecting both pedicles, the lateral mass on the right there is anterolisthesis of 3 mm presumably secondary to the fracture. No fracture of C3. There is chronic facet arthropathy with anterolisthesis of 4 mm. There is disc space narrowing. C4-5 shows facet degeneration on the left with 1 mm of anterolisthesis. No stenosis. C5-6 shows degenerative spondylosis with endplate osteophytes but no canal or foraminal stenosis. C6-7 and C7-T1 are normal. IMPRESSION: Head CT: No acute or traumatic intracranial finding. Left parietal vertex scalp laceration. No skull fracture. Cervical spine CT: Acute hangman's fracture of C2 with fractures of both pedicles and the lateral mass on the right. Anterolisthesis of 3 mm. No evidence of canal compromise. Critical Value/emergent results were called by telephone at the time of interpretation on 12/02/2015 at 2:54 pm to Dr. Nat Christen , who verbally acknowledged these results. Electronically Signed   By: Nelson Chimes M.D.   On: 12/02/2015 14:57   Ct Cervical Spine Wo Contrast  12/02/2015  CLINICAL DATA:  Found bleeding from the head. Fell to the floor. Unwitnessed. EXAM: CT HEAD WITHOUT  CONTRAST CT CERVICAL SPINE WITHOUT CONTRAST TECHNIQUE: Multidetector CT imaging of the head and cervical spine was performed following the standard protocol without intravenous contrast. Multiplanar CT image reconstructions of the cervical spine were also generated. COMPARISON:  None. FINDINGS: CT HEAD FINDINGS There is mild generalized brain atrophy. No evidence of old or acute focal infarction, mass lesion, hemorrhage, hydrocephalus or extra-axial collection. Left parietal vertex scalp laceration. No underlying skull fracture. No fluid in the sinuses, middle ears or mastoids. There is atherosclerotic calcification of the major vessels at the base of the brain. CT CERVICAL SPINE FINDINGS Skullbase and C1 are intact. There are hangman's fractures of the C2 vertebral body affecting both pedicles, the  lateral mass on the right there is anterolisthesis of 3 mm presumably secondary to the fracture. No fracture of C3. There is chronic facet arthropathy with anterolisthesis of 4 mm. There is disc space narrowing. C4-5 shows facet degeneration on the left with 1 mm of anterolisthesis. No stenosis. C5-6 shows degenerative spondylosis with endplate osteophytes but no canal or foraminal stenosis. C6-7 and C7-T1 are normal. IMPRESSION: Head CT: No acute or traumatic intracranial finding. Left parietal vertex scalp laceration. No skull fracture. Cervical spine CT: Acute hangman's fracture of C2 with fractures of both pedicles and the lateral mass on the right. Anterolisthesis of 3 mm. No evidence of canal compromise. Critical Value/emergent results were called by telephone at the time of interpretation on 12/02/2015 at 2:54 pm to Dr. Nat Christen , who verbally acknowledged these results. Electronically Signed   By: Nelson Chimes M.D.   On: 12/02/2015 14:57   I have personally reviewed and evaluated these images and lab results as part of my medical decision-making.   EKG Interpretation None     CRITICAL CARE Performed by: Nat Christen Total critical care time: 30 minutes Critical care time was exclusive of separately billable procedures and treating other patients. Critical care was necessary to treat or prevent imminent or life-threatening deterioration. Critical care was time spent personally by me on the following activities: development of treatment plan with patient and/or surrogate as well as nursing, discussions with consultants, evaluation of patient's response to treatment, examination of patient, obtaining history from patient or surrogate, ordering and performing treatments and interventions, ordering and review of laboratory studies, ordering and review of radiographic studies, pulse oximetry and re-evaluation of patient's condition. MDM   Final diagnoses:  Fall, initial encounter  Closed  nondisplaced fracture of second cervical vertebra, unspecified fracture morphology, initial encounter ([place])  Scalp laceration, initial encounter    Will discuss C2 fracture with neurosurgery. Probable admission. No neurological deficits at this time.  1600:  Spoke with neurosurgeon. He did not recommend surgery. Patient can go home with a firm collar. Staples out in 1 week. All these findings were discussed with the patient and her husband.  Nat Christen, MD 12/02/15 [place], MD 12/02/15 [phone]

## 2015-12-02 NOTE — ED Notes (Signed)
Patient transported to CT 

## 2016-07-24 ENCOUNTER — Emergency Department (HOSPITAL_COMMUNITY)
Admission: EM | Admit: 2016-07-24 | Discharge: 2016-07-24 | Disposition: A | Discharge status: Home / Self Care | Payer: Commercial Managed Care - PPO | Attending: Emergency Medicine | Admitting: Emergency Medicine

## 2016-07-24 ENCOUNTER — Emergency Department (HOSPITAL_COMMUNITY): Payer: Commercial Managed Care - PPO

## 2016-07-24 ENCOUNTER — Encounter (HOSPITAL_COMMUNITY): Payer: Self-pay | Admitting: Nurse Practitioner

## 2016-07-24 DIAGNOSIS — Z96643 Presence of artificial hip joint, bilateral: Secondary | ICD-10-CM | POA: Insufficient documentation

## 2016-07-24 DIAGNOSIS — Y939 Activity, unspecified: Secondary | ICD-10-CM | POA: Insufficient documentation

## 2016-07-24 DIAGNOSIS — Y92003 Bedroom of unspecified non-institutional (private) residence as the place of occurrence of the external cause: Secondary | ICD-10-CM | POA: Insufficient documentation

## 2016-07-24 DIAGNOSIS — S060X0A Concussion without loss of consciousness, initial encounter: Secondary | ICD-10-CM | POA: Diagnosis not present

## 2016-07-24 DIAGNOSIS — Z79899 Other long term (current) drug therapy: Secondary | ICD-10-CM | POA: Insufficient documentation

## 2016-07-24 DIAGNOSIS — Y999 Unspecified external cause status: Secondary | ICD-10-CM | POA: Insufficient documentation

## 2016-07-24 DIAGNOSIS — W0110XA Fall on same level from slipping, tripping and stumbling with subsequent striking against unspecified object, initial encounter: Secondary | ICD-10-CM | POA: Insufficient documentation

## 2016-07-24 DIAGNOSIS — S0990XA Unspecified injury of head, initial encounter: Secondary | ICD-10-CM | POA: Diagnosis present

## 2016-07-24 MED ORDER — IBUPROFEN 400 MG PO TABS: 400.0000 mg | ORAL_TABLET | Freq: Four times a day (QID) | ORAL | 0 refills | Status: DC | PRN

## 2016-07-24 MED ORDER — IBUPROFEN 200 MG PO TABS
600.0000 mg | ORAL_TABLET | Freq: Once | ORAL | Status: AC
  Administered 2016-07-24: 600 mg via ORAL
  Filled 2016-07-24: qty 3

## 2016-07-24 MED ORDER — HYDROCODONE-ACETAMINOPHEN 5-325 MG PO TABS
1.0000 | ORAL_TABLET | Freq: Once | ORAL | Status: AC
  Administered 2016-07-24: 1 via ORAL
  Filled 2016-07-24: qty 1

## 2016-07-24 MED ORDER — TRAMADOL HCL 50 MG PO TABS: 50.0000 mg | ORAL_TABLET | Freq: Four times a day (QID) | ORAL | 0 refills | Status: DC | PRN

## 2016-07-24 NOTE — ED Triage Notes (Signed)
Pt is presented from by her spouse, reportedly had a mechanical fall where she tripped on furniture in her bedroom, has an obvious hematoma to frontal aspect of her head, AOx4, pupils PERRLA, denies N/V or being on anticoagulants, spouse reports she is at baseline mental status. Denies cervical spine or back pain.

## 2016-07-24 NOTE — ED Provider Notes (Signed)
[facility] DEPT Provider Note   CSN: [id number] Arrival date & time: 07/24/16  1604     History   Chief Complaint Chief Complaint  Patient presents with  . Head Injury  . Fall    HPI Samantha Atkins is a 65 y.o. female.  HPI Samantha Atkins is a 65 y.o. female with history of osteoporosis, depression, anxiety, insomnia, presents to emergency department after a fall. Patient's husband states that patient tripped, states there was some objects on the floor, and reports the patient has had baseline unsteady gait due to prior hip surgeries. Patient denies feeling dizzy or lightheaded prior to the fall. She denies any loss of consciousness. Patient fell hitting right side of her head. Fall occurred just prior to arrival. She reports headache, denies any dizziness, nausea, vomiting, amnesia. Patient's husband states that patient is slow to respond. She is not anticoagulated. History of falls and head injury in the past. History of cervical spine fractures and the bouncer in a fall.  Past Medical History:  Diagnosis Date  . Anxiety   . Arthritis    hands  . Depression   . Fracture of humerus, proximal, left, closed 08/07/2015  . Fracture, humerus closed 07-31-15 fell   left  . GERD (gastroesophageal reflux disease)   . Headache   . Insomnia   . Migraines   . Osteoporosis     Patient Active Problem List   Diagnosis Date Noted  . Fracture of humerus, proximal, left, closed 08/07/2015    Past Surgical History:  Procedure Laterality Date  . ABDOMINAL HYSTERECTOMY    . FEMUR FRACTURE SURGERY Left    fell, redo hip replacement  . JOINT REPLACEMENT Bilateral    hip  . ORIF DISTAL RADIUS FRACTURE Left 03-2015  . ORIF HUMERUS FRACTURE Left 08/07/2015   Procedure: LEFT OPEN REDUCTION INTERNAL FIXATION (ORIF) PROXIMAL HUMERUS FRACTURE;  Surgeon: Marchia Bond, MD;  Location: [facility];  Service: Orthopedics;  Laterality: Left;  . TONSILLECTOMY      OB History    Gravida Para Term Preterm AB Living   0 0 0 0 0     SAB TAB Ectopic Multiple Live Births   0 0 0           Home Medications    Prior to Admission medications   Medication Sig Start Date End Date Taking? Authorizing Provider  baclofen (LIORESAL) 10 MG tablet Take 1 tablet (10 mg total) by mouth 3 (three) times daily. As needed for muscle spasm Patient not taking: Reported on 12/02/2015 08/07/15   Marchia Bond, MD  butalbital-acetaminophen-caffeine (FIORICET, ESGIC) [phone] MG tablet Take 1 tablet by mouth 2 (two) times daily as needed for headache.     Historical Provider, MD  estradiol (ESTRACE) 1 MG tablet Take 1 mg by mouth daily.    Historical Provider, MD  FLUoxetine (PROZAC) 20 MG capsule Take 60 mg by mouth daily.    Historical Provider, MD  HYDROcodone-acetaminophen (NORCO/VICODIN) 5-325 MG tablet Take 1 tablet by mouth every 6 (six) hours as needed for moderate pain.  11/26/15   Historical Provider, MD  OLANZapine (ZYPREXA) 15 MG tablet Take 15 mg by mouth at bedtime. Reported on 12/02/2015    Historical Provider, MD  omeprazole (PRILOSEC) 20 MG capsule Take 20 mg by mouth daily.    Historical Provider, MD  ondansetron (ZOFRAN) 4 MG tablet Take 1 tablet (4 mg total) by mouth every 8 (eight) hours as needed for nausea or vomiting.  Patient not taking: Reported on 12/02/2015 08/07/15   Marchia Bond, MD  oxyCODONE-acetaminophen (PERCOCET) 10-325 MG tablet Take 1-2 tablets by mouth every 6 (six) hours as needed for pain. MAXIMUM TOTAL ACETAMINOPHEN DOSE IS 4000 MG PER DAY 08/07/15   Marchia Bond, MD  sennosides-docusate sodium (SENOKOT-S) 8.6-50 MG tablet Take 2 tablets by mouth daily. Patient not taking: Reported on 12/02/2015 08/07/15   Marchia Bond, MD    Family History No family history on file.  Social History Social History  Substance Use Topics  . Smoking status: Never Smoker  . Smokeless tobacco: Not on file  . Alcohol use No     Allergies   Dust mite  extract   Review of Systems Review of Systems  Constitutional: Negative for chills and fever.  Respiratory: Negative for cough, chest tightness and shortness of breath.   Cardiovascular: Negative for chest pain, palpitations and leg swelling.  Gastrointestinal: Negative for abdominal pain, diarrhea, nausea and vomiting.  Musculoskeletal: Negative for arthralgias, myalgias, neck pain and neck stiffness.  Skin: Negative for rash.  Neurological: Positive for headaches. Negative for dizziness, syncope, weakness and numbness.  Psychiatric/Behavioral: Negative for confusion.  All other systems reviewed and are negative.    Physical Exam Updated Vital Signs BP 106/65   Pulse 70   Temp 98.9 F (37.2 C)   Resp 16   SpO2 100%   Physical Exam  Constitutional: She is oriented to person, place, and time. She appears well-developed and well-nourished.  Appears to be in pain  HENT:  Head: Normocephalic.  Large hematoma to the right forehead. No periorbital swelling. No swelling and tenderness of her bridge of the nose or bilateral maxilla. No hemotympanum bilaterally. No trismus.  Eyes: Conjunctivae and EOM are normal. Pupils are equal, round, and reactive to light.  Neck: Neck supple.  Cardiovascular: Normal rate, regular rhythm and normal heart sounds.   Pulmonary/Chest: Effort normal and breath sounds normal. No respiratory distress. She has no wheezes. She has no rales.  Abdominal: Soft. Bowel sounds are normal. She exhibits no distension. There is no tenderness. There is no rebound.  Musculoskeletal: She exhibits no edema.  Neurological: She is alert and oriented to person, place, and time.  5/5 and equal upper and lower extremity strength bilaterally. Equal grip strength bilaterally. Normal finger to nose and heel to shin. No pronator drift.   Skin: Skin is warm and dry. Capillary refill takes less than 2 seconds.  Psychiatric: She has a normal mood and affect. Her behavior is normal.   Nursing note and vitals reviewed.    ED Treatments / Results  Labs (all labs ordered are listed, but only abnormal results are displayed) Labs Reviewed - No data to display  EKG  EKG Interpretation None       Radiology Ct Head Wo Contrast  Result Date: 07/24/2016 CLINICAL DATA:  Tripped on furniture in bedroom and fell with forehead hematoma. Initial encounter. EXAM: CT HEAD WITHOUT CONTRAST CT CERVICAL SPINE WITHOUT CONTRAST TECHNIQUE: Multidetector CT imaging of the head and cervical spine was performed following the standard protocol without intravenous contrast. Multiplanar CT image reconstructions of the cervical spine were also generated. COMPARISON:  Head and cervical spine CT 12/02/2015. Cervical spine radiographs 04/20/2016. FINDINGS: CT HEAD FINDINGS Brain: There is no evidence of acute cortical infarct, intracranial hemorrhage, mass, midline shift, or extra-axial fluid collection. Mild generalized cerebral atrophy is unchanged. Periventricular white matter hypodensities are also unchanged and compatible with mild chronic small vessel ischemic disease. Vascular:  Mild atherosclerotic vascular calcification at the skullbase. No hyperdense vessel. Skull: No fracture or focal osseous lesion. Sinuses/Orbits: Visualized paranasal sinuses and mastoid air cells are clear. Orbits are unremarkable. Other: Small right frontal scalp hematoma. CT CERVICAL SPINE FINDINGS Alignment: 4 mm anterolisthesis of C2 on C3, increased from the prior CT but unchanged from the more recent radiographs. 3 mm anterolisthesis C3 on C4 and and trace anterolisthesis of C4 on C5, unchanged. Skull base and vertebrae: Fractures are again seen involving the C2 pedicle/pars bilaterally. The right-sided fracture demonstrates evidence of interval healing with bridging bone across the pars fracture, although the more posterior aspect of the fracture through the pedicle and base of the transverse process remains displaced  and unhealed. There is slightly greater distraction of the left-sided fracture, with a 3 mm gap along the articular surface of the superior facet (previously 1.5 mm). No new fracture is identified. Soft tissues and spinal canal: No prevertebral fluid or swelling. No visible canal hematoma. Disc levels: Similar appearance of mild disc degeneration from C3-C6, greatest at C3-4 and C5-6. Left-sided facet arthrosis at C3-4 and C5-6. Upper chest: Unremarkable. Other: Unremarkable. IMPRESSION: 1. No evidence of acute intracranial abnormality. 2. Right frontal scalp hematoma. 3. Chronic C2 hangman's fracture. Evidence of some interval healing on the right. Slightly increased displacement of the left-sided fracture. 4. No evidence of acute cervical spine fracture. Electronically Signed   By: Logan Bores M.D.   On: 07/24/2016 18:02   Ct Cervical Spine Wo Contrast  Result Date: 07/24/2016 CLINICAL DATA:  Tripped on furniture in bedroom and fell with forehead hematoma. Initial encounter. EXAM: CT HEAD WITHOUT CONTRAST CT CERVICAL SPINE WITHOUT CONTRAST TECHNIQUE: Multidetector CT imaging of the head and cervical spine was performed following the standard protocol without intravenous contrast. Multiplanar CT image reconstructions of the cervical spine were also generated. COMPARISON:  Head and cervical spine CT 12/02/2015. Cervical spine radiographs 04/20/2016. FINDINGS: CT HEAD FINDINGS Brain: There is no evidence of acute cortical infarct, intracranial hemorrhage, mass, midline shift, or extra-axial fluid collection. Mild generalized cerebral atrophy is unchanged. Periventricular white matter hypodensities are also unchanged and compatible with mild chronic small vessel ischemic disease. Vascular: Mild atherosclerotic vascular calcification at the skullbase. No hyperdense vessel. Skull: No fracture or focal osseous lesion. Sinuses/Orbits: Visualized paranasal sinuses and mastoid air cells are clear. Orbits are  unremarkable. Other: Small right frontal scalp hematoma. CT CERVICAL SPINE FINDINGS Alignment: 4 mm anterolisthesis of C2 on C3, increased from the prior CT but unchanged from the more recent radiographs. 3 mm anterolisthesis C3 on C4 and and trace anterolisthesis of C4 on C5, unchanged. Skull base and vertebrae: Fractures are again seen involving the C2 pedicle/pars bilaterally. The right-sided fracture demonstrates evidence of interval healing with bridging bone across the pars fracture, although the more posterior aspect of the fracture through the pedicle and base of the transverse process remains displaced and unhealed. There is slightly greater distraction of the left-sided fracture, with a 3 mm gap along the articular surface of the superior facet (previously 1.5 mm). No new fracture is identified. Soft tissues and spinal canal: No prevertebral fluid or swelling. No visible canal hematoma. Disc levels: Similar appearance of mild disc degeneration from C3-C6, greatest at C3-4 and C5-6. Left-sided facet arthrosis at C3-4 and C5-6. Upper chest: Unremarkable. Other: Unremarkable. IMPRESSION: 1. No evidence of acute intracranial abnormality. 2. Right frontal scalp hematoma. 3. Chronic C2 hangman's fracture. Evidence of some interval healing on the right. Slightly increased displacement of  the left-sided fracture. 4. No evidence of acute cervical spine fracture. Electronically Signed   By: Logan Bores M.D.   On: 07/24/2016 18:02    Procedures Procedures (including critical care time)  Medications Ordered in ED Medications - No data to display   Initial Impression / Assessment and Plan / ED Course  I have reviewed the triage vital signs and the nursing notes.  Pertinent labs & imaging results that were available during my care of the patient were reviewed by me and considered in my medical decision making (see chart for details).  Clinical Course     Patient in emergency department after what  appears to be a mechanical fall. History of falls in the past. Patient has normal neurological exam but complaining of severe headache and according to her husband patient is very slow to respond. Will treat headache with Norco, will get CT head and cervical spine. Normal neurological examination is time.  7:00 PM CT head is negative. CT cervical spine showing left sided nonhealing fracture of C2 with slightly increased displacement. Discussed with Dr. Ralene Bathe, who has seen this patient as well. Patient is not having any pain in her neck or any neurological deficits in her upper or lower extremities. I spoke with Dr. Cyndy Freeze, who instructed me to have patient call the office tomorrow for follow-up with Dr. Ellene Route, no cervical collar necessary. Discussed plan with pt, who agrees.   7:36 PM Patient ambulated in the hallway with a cane. Gait is slow, but is able to ambulate. According to husband, gait is at baseline. Will discharge home. Patient is asking for pain medications, will give ibuprofen and tramadol. Follow up with primary care doctor and with neurosurgery.  Vitals:   07/24/16 1609 07/24/16 1848  BP: 106/65 97/71  Pulse: 70 72  Resp: 16 16  Temp: 98.9 F (37.2 C)   SpO2: 100% 96%     Final Clinical Impressions(s) / ED Diagnoses   Final diagnoses:  Injury of head, initial encounter  Concussion without loss of consciousness, initial encounter    New Prescriptions New Prescriptions   IBUPROFEN (ADVIL,MOTRIN) 400 MG TABLET    Take 1 tablet (400 mg total) by mouth every 6 (six) hours as needed.   TRAMADOL (ULTRAM) 50 MG TABLET    Take 1 tablet (50 mg total) by mouth every 6 (six) hours as needed.     Jeannett Senior, PA-C 07/24/16 1939    Quintella Reichert, MD 07/24/16 2109

## 2016-07-24 NOTE — Discharge Instructions (Signed)
Ice pack to the bruised area several times a day. Ibuprofen for pain. Tramadol for severe pain only. Call and follow up with Dr. Ellene Route next week for non healing C2 fracture. Return if worsening symptoms.

## 2017-02-11 ENCOUNTER — Inpatient Hospital Stay (HOSPITAL_COMMUNITY)
Admission: EM | Admit: 2017-02-11 | Discharge: 2017-02-17 | DRG: 466 | Disposition: A | Discharge status: Home / Self Care | Payer: Commercial Managed Care - PPO | Attending: Internal Medicine | Admitting: Internal Medicine

## 2017-02-11 ENCOUNTER — Encounter (HOSPITAL_COMMUNITY): Payer: Self-pay | Admitting: Emergency Medicine

## 2017-02-11 ENCOUNTER — Emergency Department (HOSPITAL_COMMUNITY): Payer: Commercial Managed Care - PPO

## 2017-02-11 DIAGNOSIS — I700 Atherosclerosis of aorta: Secondary | ICD-10-CM | POA: Diagnosis present

## 2017-02-11 DIAGNOSIS — Z79899 Other long term (current) drug therapy: Secondary | ICD-10-CM

## 2017-02-11 DIAGNOSIS — D62 Acute posthemorrhagic anemia: Secondary | ICD-10-CM | POA: Diagnosis not present

## 2017-02-11 DIAGNOSIS — R9431 Abnormal electrocardiogram [ECG] [EKG]: Secondary | ICD-10-CM | POA: Diagnosis present

## 2017-02-11 DIAGNOSIS — Z8249 Family history of ischemic heart disease and other diseases of the circulatory system: Secondary | ICD-10-CM

## 2017-02-11 DIAGNOSIS — M810 Age-related osteoporosis without current pathological fracture: Secondary | ICD-10-CM | POA: Diagnosis present

## 2017-02-11 DIAGNOSIS — D72829 Elevated white blood cell count, unspecified: Secondary | ICD-10-CM | POA: Diagnosis present

## 2017-02-11 DIAGNOSIS — M8000XA Age-related osteoporosis with current pathological fracture, unspecified site, initial encounter for fracture: Secondary | ICD-10-CM | POA: Diagnosis not present

## 2017-02-11 DIAGNOSIS — G43909 Migraine, unspecified, not intractable, without status migrainosus: Secondary | ICD-10-CM | POA: Diagnosis present

## 2017-02-11 DIAGNOSIS — Z9889 Other specified postprocedural states: Secondary | ICD-10-CM

## 2017-02-11 DIAGNOSIS — Z681 Body mass index (BMI) 19 or less, adult: Secondary | ICD-10-CM

## 2017-02-11 DIAGNOSIS — M9702XA Periprosthetic fracture around internal prosthetic left hip joint, initial encounter: Principal | ICD-10-CM | POA: Diagnosis present

## 2017-02-11 DIAGNOSIS — S42202A Unspecified fracture of upper end of left humerus, initial encounter for closed fracture: Secondary | ICD-10-CM | POA: Diagnosis present

## 2017-02-11 DIAGNOSIS — R945 Abnormal results of liver function studies: Secondary | ICD-10-CM

## 2017-02-11 DIAGNOSIS — Z8781 Personal history of (healed) traumatic fracture: Secondary | ICD-10-CM

## 2017-02-11 DIAGNOSIS — Z96643 Presence of artificial hip joint, bilateral: Secondary | ICD-10-CM | POA: Diagnosis present

## 2017-02-11 DIAGNOSIS — S72112A Displaced fracture of greater trochanter of left femur, initial encounter for closed fracture: Secondary | ICD-10-CM

## 2017-02-11 DIAGNOSIS — R569 Unspecified convulsions: Secondary | ICD-10-CM | POA: Diagnosis present

## 2017-02-11 DIAGNOSIS — F329 Major depressive disorder, single episode, unspecified: Secondary | ICD-10-CM | POA: Diagnosis present

## 2017-02-11 DIAGNOSIS — Z419 Encounter for procedure for purposes other than remedying health state, unspecified: Secondary | ICD-10-CM

## 2017-02-11 DIAGNOSIS — K219 Gastro-esophageal reflux disease without esophagitis: Secondary | ICD-10-CM | POA: Diagnosis present

## 2017-02-11 DIAGNOSIS — E876 Hypokalemia: Secondary | ICD-10-CM | POA: Diagnosis present

## 2017-02-11 DIAGNOSIS — H409 Unspecified glaucoma: Secondary | ICD-10-CM | POA: Diagnosis present

## 2017-02-11 DIAGNOSIS — F3342 Major depressive disorder, recurrent, in full remission: Secondary | ICD-10-CM

## 2017-02-11 DIAGNOSIS — Z9181 History of falling: Secondary | ICD-10-CM

## 2017-02-11 DIAGNOSIS — R7989 Other specified abnormal findings of blood chemistry: Secondary | ICD-10-CM

## 2017-02-11 DIAGNOSIS — E43 Unspecified severe protein-calorie malnutrition: Secondary | ICD-10-CM | POA: Insufficient documentation

## 2017-02-11 DIAGNOSIS — G43809 Other migraine, not intractable, without status migrainosus: Secondary | ICD-10-CM

## 2017-02-11 DIAGNOSIS — R339 Retention of urine, unspecified: Secondary | ICD-10-CM | POA: Diagnosis present

## 2017-02-11 DIAGNOSIS — Z90710 Acquired absence of both cervix and uterus: Secondary | ICD-10-CM

## 2017-02-11 DIAGNOSIS — Y92013 Bedroom of single-family (private) house as the place of occurrence of the external cause: Secondary | ICD-10-CM

## 2017-02-11 DIAGNOSIS — F32A Depression, unspecified: Secondary | ICD-10-CM | POA: Diagnosis present

## 2017-02-11 DIAGNOSIS — W01190A Fall on same level from slipping, tripping and stumbling with subsequent striking against furniture, initial encounter: Secondary | ICD-10-CM | POA: Diagnosis present

## 2017-02-11 DIAGNOSIS — R0781 Pleurodynia: Secondary | ICD-10-CM

## 2017-02-11 LAB — CBC WITH DIFFERENTIAL/PLATELET
Basophils Absolute: 0 10*3/uL (ref 0.0–0.1)
Basophils Relative: 0 %
EOS PCT: 0 %
Eosinophils Absolute: 0 10*3/uL (ref 0.0–0.7)
HEMATOCRIT: 32.9 % — AB (ref 36.0–46.0)
Hemoglobin: 11.5 g/dL — ABNORMAL LOW (ref 12.0–15.0)
LYMPHS PCT: 8 %
Lymphs Abs: 1.1 10*3/uL (ref 0.7–4.0)
MCH: 34.6 pg — AB (ref 26.0–34.0)
MCHC: 35 g/dL (ref 30.0–36.0)
MCV: 99.1 fL (ref 78.0–100.0)
MONO ABS: 0.9 10*3/uL (ref 0.1–1.0)
MONOS PCT: 6 %
NEUTROS ABS: 11.8 10*3/uL — AB (ref 1.7–7.7)
Neutrophils Relative %: 86 %
PLATELETS: 202 10*3/uL (ref 150–400)
RBC: 3.32 MIL/uL — ABNORMAL LOW (ref 3.87–5.11)
RDW: 12.6 % (ref 11.5–15.5)
WBC: 13.7 10*3/uL — ABNORMAL HIGH (ref 4.0–10.5)

## 2017-02-11 LAB — BASIC METABOLIC PANEL
Anion gap: 10 (ref 5–15)
BUN: 15 mg/dL (ref 6–20)
CO2: 23 mmol/L (ref 22–32)
Calcium: 8.3 mg/dL — ABNORMAL LOW (ref 8.9–10.3)
Chloride: 109 mmol/L (ref 101–111)
Creatinine, Ser: 0.48 mg/dL (ref 0.44–1.00)
GFR calc Af Amer: >60 mL/min (ref >60)
GFR calc non Af Amer: >60 mL/min (ref >60)
GLUCOSE: 121 mg/dL — AB (ref 65–99)
POTASSIUM: 3.6 mmol/L (ref 3.5–5.1)
Sodium: 142 mmol/L (ref 135–145)

## 2017-02-11 LAB — PROTIME-INR
INR: 1.04
Prothrombin Time: 13.6 seconds (ref 11.4–15.2)

## 2017-02-11 MED ORDER — SODIUM CHLORIDE 0.9 % IV SOLN
Freq: Once | INTRAVENOUS | Status: AC
  Administered 2017-02-11: 22:00:00 via INTRAVENOUS

## 2017-02-11 MED ORDER — FENTANYL CITRATE (PF) 100 MCG/2ML IJ SOLN
50.0000 ug | INTRAMUSCULAR | Status: DC | PRN
  Administered 2017-02-12: 50 ug via INTRAVENOUS
  Filled 2017-02-11 (×2): qty 2

## 2017-02-11 MED ORDER — FENTANYL CITRATE (PF) 100 MCG/2ML IJ SOLN
100.0000 ug | Freq: Once | INTRAMUSCULAR | Status: AC
  Administered 2017-02-11: 100 ug via INTRAVENOUS
  Filled 2017-02-11: qty 2

## 2017-02-11 NOTE — ED Provider Notes (Signed)
[facility] DEPT Provider Note   CSN: [id number] Arrival date & time: 02/11/17  1957     History   Chief Complaint Chief Complaint  Patient presents with  . Fall  . Hip Injury    HPI Samantha Atkins is a 66 y.o. female.  HPI Patient had a mechanical fall earlier this evening. Her feet seem to get caught up and she lost her balance falling directly onto her left side. Her husband was present and witnessed it. She did hit the side of her head on a small rocking chair but was not knocked out. She does not have headache confusion, nausea or vomiting. Patient does not take anticoagulants. No neck pain. No chest pain. Patient's main area of pain is her left hip which has a deformity present. She is not able to move it and has severe pain. Past Medical History:  Diagnosis Date  . Anxiety   . Arthritis    hands  . Depression   . Fracture of humerus, proximal, left, closed 08/07/2015  . Fracture, humerus closed 07-31-15 fell   left  . GERD (gastroesophageal reflux disease)   . Headache   . Insomnia   . Migraines   . Osteoporosis     Patient Active Problem List   Diagnosis Date Noted  . Fracture of humerus, proximal, left, closed 08/07/2015    Past Surgical History:  Procedure Laterality Date  . ABDOMINAL HYSTERECTOMY    . FEMUR FRACTURE SURGERY Left    fell, redo hip replacement  . JOINT REPLACEMENT Bilateral    hip  . ORIF DISTAL RADIUS FRACTURE Left 03-2015  . ORIF HUMERUS FRACTURE Left 08/07/2015   Procedure: LEFT OPEN REDUCTION INTERNAL FIXATION (ORIF) PROXIMAL HUMERUS FRACTURE;  Surgeon: Marchia Bond, MD;  Location: [facility];  Service: Orthopedics;  Laterality: Left;  . TONSILLECTOMY      OB History    Gravida Para Term Preterm AB Living   0 0 0 0 0     SAB TAB Ectopic Multiple Live Births   0 0 0           Home Medications    Prior to Admission medications   Medication Sig Start Date End Date Taking? Authorizing Provider    butalbital-acetaminophen-caffeine (FIORICET, ESGIC) 50-325-40 MG tablet Take 1 tablet by mouth 2 (two) times daily as needed for headache.     [provider]  escitalopram (LEXAPRO) 10 MG tablet Take 10 mg by mouth daily. 06/29/16   [provider]  ibuprofen (ADVIL,MOTRIN) 400 MG tablet Take 1 tablet (400 mg total) by mouth every 6 (six) hours as needed. 07/24/16   Kirichenko, Lahoma Rocker, PA-C  Potassium Chloride ER 20 MEQ TBCR Take 20 mg by mouth daily. with food 06/09/16   [provider]  traMADol (ULTRAM) 50 MG tablet Take 1 tablet (50 mg total) by mouth every 6 (six) hours as needed. 07/24/16   Jeannett Senior, PA-C    Family History History reviewed. No pertinent family history.  Social History Social History  Substance Use Topics  . Smoking status: Never Smoker  . Smokeless tobacco: Never Used  . Alcohol use No     Allergies   Dust mite extract   Review of Systems Review of Systems 10 Systems reviewed and are negative for acute change except as noted in the HPI.   Physical Exam Updated Vital Signs BP 112/70   Pulse 62   Temp 98.5 F (36.9 C) (Oral)   Resp 19  SpO2 99%   Physical Exam  Constitutional: She is oriented to person, place, and time. She appears well-developed and well-nourished. No distress.  HENT:  Head: Normocephalic and atraumatic.  Left Ear: External ear normal.  Nose: Nose normal.  Mouth/Throat: Oropharynx is clear and moist.  Eyes: Conjunctivae and EOM are normal. Pupils are equal, round, and reactive to light.  Neck: Neck supple.  No C-spine tenderness to palpation  Cardiovascular: Normal rate and regular rhythm.   No murmur heard. Pulmonary/Chest: Effort normal and breath sounds normal. No respiratory distress.  Chest wall nontender to compression.  Abdominal: Soft. She exhibits no distension. There is no tenderness.  Musculoskeletal: She exhibits no edema.  Normal range of motion bilateral upper  extremities up pain. Patient has evident deformity of the left hip with abnormal contours over the trochanter. Leg is shortened and internally rotated. Neurovascularly intact.  Neurological: She is alert and oriented to person, place, and time. No cranial nerve deficit. She exhibits normal muscle tone. Coordination normal.  Skin: Skin is warm and dry.  Psychiatric: She has a normal mood and affect.  Nursing note and vitals reviewed.    ED Treatments / Results  Labs (all labs ordered are listed, but only abnormal results are displayed) Labs Reviewed  BASIC METABOLIC PANEL  CBC WITH DIFFERENTIAL/PLATELET  PROTIME-INR  TYPE AND SCREEN    EKG  EKG Interpretation None       Radiology Dg Chest Port 1 View  Result Date: 02/11/2017 CLINICAL DATA:  Pain following fall EXAM: PORTABLE CHEST 1 VIEW COMPARISON:  None. FINDINGS: Lungs are clear. Heart size and pulmonary vascularity are normal. No adenopathy. There is aortic atherosclerosis. No pneumothorax. There old healed rib fractures bilaterally. No acute fracture evident. There is postoperative change in the proximal left humerus. IMPRESSION: No edema or consolidation. Old healed fractures bilaterally. No pneumothorax. There is aortic atherosclerosis. Aortic Atherosclerosis (ICD10-I70.0). Electronically Signed   By: Lowella Grip III M.D.   On: 02/11/2017 21:21   Dg Hip Unilat With Pelvis 2-3 Views Left  Result Date: 02/11/2017 CLINICAL DATA:  Left hip pain with external rotation and shortening after a fall. EXAM: DG HIP (WITH OR WITHOUT PELVIS) 2-3V LEFT COMPARISON:  None. FINDINGS: Obliquely oriented acute fracture of the left proximal femur extending from greater trochanter to the medial proximal diaphysis with lateral apex angulation. The left hip prosthesis is well seated in the acetabular fossa without dislocation of the prosthesis. Right proximal femur prosthesis is partially visualized and appears intact. No pelvic fracture or  diastases identified. IMPRESSION: Obliquely oriented acute fracture of the left proximal femur extending from greater trochanter to the medial proximal diaphysis with lateral apex angulation. Electronically Signed   By: Kristine Garbe M.D.   On: 02/11/2017 20:49    Procedures Procedures (including critical care time)  Medications Ordered in ED Medications  fentaNYL (SUBLIMAZE) injection 50 mcg (not administered)     Initial Impression / Assessment and Plan / ED Course  I have reviewed the triage vital signs and the nursing notes.  Pertinent labs & imaging results that were available during my care of the patient were reviewed by me and considered in my medical decision making (see chart for details).     Consult: [facility] hospitalist for admission Consult: Dr. Ronnie Derby for orthopedics  Final Clinical Impressions(s) / ED Diagnoses   Final diagnoses:  Displaced fracture of greater trochanter of left femur, initial encounter for closed fracture    Patient had mechanical fall in her home.  At this time no other injuries are identified. Orthopedics and hospitalist consult it for admission and definitive management. New Prescriptions New Prescriptions   No medications on file     Charlesetta Shanks, MD 02/17/17 [phone]

## 2017-02-11 NOTE — ED Triage Notes (Signed)
Brought in by EMS from home with c/o left hip pain after her mechanical fall tonight.  Pt was witnessed by spouse falling on her left side, also hitting head against floor board--- has had no loss of consciousness.  Pt c/o immediate pain to left hip after fall, no other complaints.  Pt has obvious deformity to left leg on EMS' arrival at the scene---- pt's left leg externally rotated and shortened.  Pt was given Fentanyl 50 mcg IV x 3 doses en route.

## 2017-02-11 NOTE — ED Notes (Signed)
Bed: [facility] Expected date:  Expected time:  Means of arrival:  Comments: 63 fx hip

## 2017-02-11 NOTE — ED Notes (Signed)
Pt. Documented in error DG Hip Unilat With Pelvis 2-3 Views Left.

## 2017-02-12 ENCOUNTER — Encounter (HOSPITAL_COMMUNITY): Payer: Self-pay | Admitting: Internal Medicine

## 2017-02-12 DIAGNOSIS — Z96643 Presence of artificial hip joint, bilateral: Secondary | ICD-10-CM | POA: Diagnosis present

## 2017-02-12 DIAGNOSIS — G43909 Migraine, unspecified, not intractable, without status migrainosus: Secondary | ICD-10-CM | POA: Diagnosis present

## 2017-02-12 DIAGNOSIS — R0781 Pleurodynia: Secondary | ICD-10-CM | POA: Diagnosis not present

## 2017-02-12 DIAGNOSIS — Z90710 Acquired absence of both cervix and uterus: Secondary | ICD-10-CM | POA: Diagnosis not present

## 2017-02-12 DIAGNOSIS — Z8781 Personal history of (healed) traumatic fracture: Secondary | ICD-10-CM | POA: Diagnosis not present

## 2017-02-12 DIAGNOSIS — E43 Unspecified severe protein-calorie malnutrition: Secondary | ICD-10-CM | POA: Diagnosis present

## 2017-02-12 DIAGNOSIS — R339 Retention of urine, unspecified: Secondary | ICD-10-CM | POA: Diagnosis present

## 2017-02-12 DIAGNOSIS — Z9181 History of falling: Secondary | ICD-10-CM | POA: Diagnosis not present

## 2017-02-12 DIAGNOSIS — Z79899 Other long term (current) drug therapy: Secondary | ICD-10-CM | POA: Diagnosis not present

## 2017-02-12 DIAGNOSIS — F329 Major depressive disorder, single episode, unspecified: Secondary | ICD-10-CM | POA: Diagnosis present

## 2017-02-12 DIAGNOSIS — M810 Age-related osteoporosis without current pathological fracture: Secondary | ICD-10-CM | POA: Diagnosis present

## 2017-02-12 DIAGNOSIS — E876 Hypokalemia: Secondary | ICD-10-CM | POA: Diagnosis present

## 2017-02-12 DIAGNOSIS — F32A Depression, unspecified: Secondary | ICD-10-CM | POA: Diagnosis present

## 2017-02-12 DIAGNOSIS — R9431 Abnormal electrocardiogram [ECG] [EKG]: Secondary | ICD-10-CM | POA: Diagnosis present

## 2017-02-12 DIAGNOSIS — Z967 Presence of other bone and tendon implants: Secondary | ICD-10-CM | POA: Diagnosis not present

## 2017-02-12 DIAGNOSIS — Z681 Body mass index (BMI) 19 or less, adult: Secondary | ICD-10-CM | POA: Diagnosis not present

## 2017-02-12 DIAGNOSIS — K219 Gastro-esophageal reflux disease without esophagitis: Secondary | ICD-10-CM | POA: Diagnosis present

## 2017-02-12 DIAGNOSIS — M8000XA Age-related osteoporosis with current pathological fracture, unspecified site, initial encounter for fracture: Secondary | ICD-10-CM | POA: Diagnosis present

## 2017-02-12 DIAGNOSIS — G43809 Other migraine, not intractable, without status migrainosus: Secondary | ICD-10-CM | POA: Diagnosis not present

## 2017-02-12 DIAGNOSIS — M9702XA Periprosthetic fracture around internal prosthetic left hip joint, initial encounter: Secondary | ICD-10-CM | POA: Diagnosis present

## 2017-02-12 DIAGNOSIS — Y92013 Bedroom of single-family (private) house as the place of occurrence of the external cause: Secondary | ICD-10-CM | POA: Diagnosis not present

## 2017-02-12 DIAGNOSIS — H409 Unspecified glaucoma: Secondary | ICD-10-CM | POA: Diagnosis present

## 2017-02-12 DIAGNOSIS — D72829 Elevated white blood cell count, unspecified: Secondary | ICD-10-CM | POA: Diagnosis present

## 2017-02-12 DIAGNOSIS — R569 Unspecified convulsions: Secondary | ICD-10-CM | POA: Diagnosis present

## 2017-02-12 DIAGNOSIS — S42295A Other nondisplaced fracture of upper end of left humerus, initial encounter for closed fracture: Secondary | ICD-10-CM

## 2017-02-12 DIAGNOSIS — S72112A Displaced fracture of greater trochanter of left femur, initial encounter for closed fracture: Secondary | ICD-10-CM

## 2017-02-12 DIAGNOSIS — Z8249 Family history of ischemic heart disease and other diseases of the circulatory system: Secondary | ICD-10-CM | POA: Diagnosis not present

## 2017-02-12 DIAGNOSIS — I700 Atherosclerosis of aorta: Secondary | ICD-10-CM | POA: Diagnosis present

## 2017-02-12 DIAGNOSIS — Z419 Encounter for procedure for purposes other than remedying health state, unspecified: Secondary | ICD-10-CM | POA: Diagnosis not present

## 2017-02-12 DIAGNOSIS — W01190A Fall on same level from slipping, tripping and stumbling with subsequent striking against furniture, initial encounter: Secondary | ICD-10-CM | POA: Diagnosis present

## 2017-02-12 DIAGNOSIS — Z9889 Other specified postprocedural states: Secondary | ICD-10-CM | POA: Diagnosis not present

## 2017-02-12 DIAGNOSIS — R7989 Other specified abnormal findings of blood chemistry: Secondary | ICD-10-CM | POA: Diagnosis not present

## 2017-02-12 DIAGNOSIS — S42202A Unspecified fracture of upper end of left humerus, initial encounter for closed fracture: Secondary | ICD-10-CM | POA: Diagnosis present

## 2017-02-12 DIAGNOSIS — D500 Iron deficiency anemia secondary to blood loss (chronic): Secondary | ICD-10-CM | POA: Diagnosis not present

## 2017-02-12 DIAGNOSIS — F3342 Major depressive disorder, recurrent, in full remission: Secondary | ICD-10-CM | POA: Diagnosis not present

## 2017-02-12 DIAGNOSIS — D62 Acute posthemorrhagic anemia: Secondary | ICD-10-CM | POA: Diagnosis not present

## 2017-02-12 DIAGNOSIS — S72009A Fracture of unspecified part of neck of unspecified femur, initial encounter for closed fracture: Secondary | ICD-10-CM | POA: Insufficient documentation

## 2017-02-12 LAB — CBC
HCT: 32.5 % — ABNORMAL LOW (ref 36.0–46.0)
HEMOGLOBIN: 11.1 g/dL — AB (ref 12.0–15.0)
MCH: 34.2 pg — ABNORMAL HIGH (ref 26.0–34.0)
MCHC: 34.2 g/dL (ref 30.0–36.0)
MCV: 100 fL (ref 78.0–100.0)
PLATELETS: 184 10*3/uL (ref 150–400)
RBC: 3.25 MIL/uL — AB (ref 3.87–5.11)
RDW: 12.7 % (ref 11.5–15.5)
WBC: 11.2 10*3/uL — AB (ref 4.0–10.5)

## 2017-02-12 LAB — URINALYSIS, ROUTINE W REFLEX MICROSCOPIC
Bilirubin Urine: NEGATIVE
Glucose, UA: NEGATIVE mg/dL
Ketones, ur: 5 mg/dL — AB
Leukocytes, UA: NEGATIVE
Nitrite: POSITIVE — AB
PROTEIN: NEGATIVE mg/dL
SPECIFIC GRAVITY, URINE: 1.017 (ref 1.005–1.030)
SQUAMOUS EPITHELIAL / LPF: NONE SEEN
pH: 6 (ref 5.0–8.0)

## 2017-02-12 LAB — BASIC METABOLIC PANEL
ANION GAP: 8 (ref 5–15)
BUN: 10 mg/dL (ref 6–20)
CALCIUM: 8.6 mg/dL — AB (ref 8.9–10.3)
CO2: 25 mmol/L (ref 22–32)
CREATININE: 0.56 mg/dL (ref 0.44–1.00)
Chloride: 107 mmol/L (ref 101–111)
Glucose, Bld: 131 mg/dL — ABNORMAL HIGH (ref 65–99)
Potassium: 4.1 mmol/L (ref 3.5–5.1)
SODIUM: 140 mmol/L (ref 135–145)

## 2017-02-12 LAB — APTT: APTT: 31 s (ref 24–36)

## 2017-02-12 LAB — PROTIME-INR
INR: 1.03
PROTHROMBIN TIME: 13.5 s (ref 11.4–15.2)

## 2017-02-12 LAB — SURGICAL PCR SCREEN
MRSA, PCR: NEGATIVE
STAPHYLOCOCCUS AUREUS: NEGATIVE

## 2017-02-12 LAB — ABO/RH: ABO/RH(D): A POS

## 2017-02-12 MED ORDER — ADULT MULTIVITAMIN W/MINERALS CH
1.0000 | ORAL_TABLET | Freq: Every day | ORAL | Status: DC
  Administered 2017-02-12 – 2017-02-17 (×5): 1 via ORAL
  Filled 2017-02-12 (×5): qty 1

## 2017-02-12 MED ORDER — SERTRALINE HCL 100 MG PO TABS
100.0000 mg | ORAL_TABLET | Freq: Every day | ORAL | Status: DC
  Administered 2017-02-12 – 2017-02-17 (×6): 100 mg via ORAL
  Filled 2017-02-12 (×6): qty 1

## 2017-02-12 MED ORDER — FENTANYL CITRATE (PF) 100 MCG/2ML IJ SOLN: 100.0000 ug | INTRAMUSCULAR | Status: DC | PRN

## 2017-02-12 MED ORDER — DOCUSATE SODIUM 100 MG PO CAPS
100.0000 mg | ORAL_CAPSULE | Freq: Two times a day (BID) | ORAL | Status: DC
  Administered 2017-02-12 – 2017-02-15 (×6): 100 mg via ORAL
  Filled 2017-02-12 (×6): qty 1

## 2017-02-12 MED ORDER — POTASSIUM CHLORIDE CRYS ER 20 MEQ PO TBCR
20.0000 meq | EXTENDED_RELEASE_TABLET | Freq: Every day | ORAL | Status: DC
  Administered 2017-02-12 – 2017-02-17 (×6): 20 meq via ORAL
  Filled 2017-02-12 (×6): qty 1

## 2017-02-12 MED ORDER — ONDANSETRON HCL 4 MG PO TABS: 4.0000 mg | ORAL_TABLET | Freq: Four times a day (QID) | ORAL | Status: DC | PRN

## 2017-02-12 MED ORDER — SODIUM CHLORIDE 0.9 % IV SOLN
INTRAVENOUS | Status: DC
  Administered 2017-02-12 (×2): via INTRAVENOUS

## 2017-02-12 MED ORDER — VITAMIN D3 25 MCG (1000 UNIT) PO TABS
1000.0000 [IU] | ORAL_TABLET | Freq: Every day | ORAL | Status: DC
  Administered 2017-02-12 – 2017-02-17 (×5): 1000 [IU] via ORAL
  Filled 2017-02-12 (×5): qty 1

## 2017-02-12 MED ORDER — MORPHINE SULFATE (PF) 2 MG/ML IV SOLN
1.0000 mg | INTRAVENOUS | Status: DC | PRN
  Administered 2017-02-12 – 2017-02-17 (×24): 2 mg via INTRAVENOUS
  Filled 2017-02-12 (×24): qty 1

## 2017-02-12 MED ORDER — FERROUS SULFATE 325 (65 FE) MG PO TABS
325.0000 mg | ORAL_TABLET | Freq: Every day | ORAL | Status: DC
  Administered 2017-02-12 – 2017-02-17 (×4): 325 mg via ORAL
  Filled 2017-02-12 (×5): qty 1

## 2017-02-12 MED ORDER — POLYETHYLENE GLYCOL 3350 17 G PO PACK: 17.0000 g | PACK | Freq: Every day | ORAL | Status: DC | PRN

## 2017-02-12 MED ORDER — HEPARIN SODIUM (PORCINE) 5000 UNIT/ML IJ SOLN
5000.0000 [IU] | Freq: Three times a day (TID) | INTRAMUSCULAR | Status: DC
  Administered 2017-02-12 – 2017-02-17 (×14): 5000 [IU] via SUBCUTANEOUS
  Filled 2017-02-12 (×13): qty 1

## 2017-02-12 MED ORDER — ESTRADIOL 1 MG PO TABS
1.0000 mg | ORAL_TABLET | Freq: Every day | ORAL | Status: DC
  Administered 2017-02-12 – 2017-02-13 (×2): 1 mg via ORAL
  Filled 2017-02-12 (×3): qty 1

## 2017-02-12 MED ORDER — HYDROCODONE-ACETAMINOPHEN 5-325 MG PO TABS
1.0000 | ORAL_TABLET | ORAL | Status: DC | PRN
  Administered 2017-02-12 – 2017-02-17 (×14): 2 via ORAL
  Filled 2017-02-12 (×14): qty 2

## 2017-02-12 MED ORDER — ACETAMINOPHEN 325 MG PO TABS: 650.0000 mg | ORAL_TABLET | Freq: Four times a day (QID) | ORAL | Status: DC | PRN

## 2017-02-12 MED ORDER — ONDANSETRON HCL 4 MG/2ML IJ SOLN
4.0000 mg | Freq: Four times a day (QID) | INTRAMUSCULAR | Status: DC | PRN
Start: 2017-02-12 — End: 2017-02-17
  Administered 2017-02-13: 4 mg via INTRAVENOUS
  Filled 2017-02-12: qty 2

## 2017-02-12 MED ORDER — BUTALBITAL-APAP-CAFFEINE 50-325-40 MG PO TABS: 1.0000 | ORAL_TABLET | Freq: Four times a day (QID) | ORAL | Status: DC | PRN

## 2017-02-12 MED ORDER — ACETAMINOPHEN 650 MG RE SUPP: 650.0000 mg | Freq: Four times a day (QID) | RECTAL | Status: DC | PRN

## 2017-02-12 NOTE — Consult Note (Signed)
Reason for Consult:left hip fracture Referring Physician: Dr.Lucey  Samantha Atkins is an 66 y.o. female.  HPI: Patient presents with left hip pain from a fall. Dr.Lucey was consulted but discussed to case with Dr.Olin who accepted. Imaging shows a left hip periprosthetic fracture. Admitted to medicine. Left hip arthroplasty was possible in 2005 by [facility] in [place], [place]. No numbness or tingling. Denies CP, SOB, or calf pain.   Past Medical History:  Diagnosis Date  . Anxiety   . Arthritis    hands  . Depression   . Fracture of humerus, proximal, left, closed 08/07/2015  . Fracture, humerus closed 07-31-15 fell   left  . GERD (gastroesophageal reflux disease)   . Headache   . Insomnia   . Migraines   . Osteoporosis     Past Surgical History:  Procedure Laterality Date  . ABDOMINAL HYSTERECTOMY    . FEMUR FRACTURE SURGERY Left    fell, redo hip replacement  . JOINT REPLACEMENT Bilateral    hip  . ORIF DISTAL RADIUS FRACTURE Left 03-2015  . ORIF HUMERUS FRACTURE Left 08/07/2015   Procedure: LEFT OPEN REDUCTION INTERNAL FIXATION (ORIF) PROXIMAL HUMERUS FRACTURE;  Surgeon: Marchia Bond, MD;  Location: [facility];  Service: Orthopedics;  Laterality: Left;  . TONSILLECTOMY      Family History  Problem Relation Age of Onset  . Hip fracture Mother   . Heart Problems Father     Social History:  reports that she has never smoked. She has never used smokeless tobacco. She reports that she does not drink alcohol or use drugs.  Allergies:  Allergies  Allergen Reactions  . Dust Mite Extract Cough    Medications: I have reviewed the patient's current medications.  Results for orders placed or performed during the hospital encounter of 02/11/17 (from the past 48 hour(s))  Basic metabolic panel     Status: Abnormal   Collection Time: 02/11/17  9:48 PM  Result Value Ref Range   Sodium 142 135 - 145 mmol/L   Potassium 3.6 3.5 - 5.1 mmol/L   Chloride 109 101 - 111  mmol/L   CO2 23 22 - 32 mmol/L   Glucose, Bld 121 (H) 65 - 99 mg/dL   BUN 15 6 - 20 mg/dL   Creatinine, Ser 0.48 0.44 - 1.00 mg/dL   Calcium 8.3 (L) 8.9 - 10.3 mg/dL   GFR calc non Af Amer >60 >60 mL/min   GFR calc Af Amer >60 >60 mL/min    Comment: (NOTE) The eGFR has been calculated using the CKD EPI equation. This calculation has not been validated in all clinical situations. eGFR's persistently <60 mL/min signify possible Chronic Kidney Disease.    Anion gap 10 5 - 15  CBC with Differential     Status: Abnormal   Collection Time: 02/11/17  9:48 PM  Result Value Ref Range   WBC 13.7 (H) 4.0 - 10.5 K/uL   RBC 3.32 (L) 3.87 - 5.11 MIL/uL   Hemoglobin 11.5 (L) 12.0 - 15.0 g/dL   HCT 32.9 (L) 36.0 - 46.0 %   MCV 99.1 78.0 - 100.0 fL   MCH 34.6 (H) 26.0 - 34.0 pg   MCHC 35.0 30.0 - 36.0 g/dL   RDW 12.6 11.5 - 15.5 %   Platelets 202 150 - 400 K/uL   Neutrophils Relative % 86 %   Neutro Abs 11.8 (H) 1.7 - 7.7 K/uL   Lymphocytes Relative 8 %   Lymphs Abs 1.1 0.7 -  4.0 K/uL   Monocytes Relative 6 %   Monocytes Absolute 0.9 0.1 - 1.0 K/uL   Eosinophils Relative 0 %   Eosinophils Absolute 0.0 0.0 - 0.7 K/uL   Basophils Relative 0 %   Basophils Absolute 0.0 0.0 - 0.1 K/uL  Protime-INR     Status: None   Collection Time: 02/11/17  9:48 PM  Result Value Ref Range   Prothrombin Time 13.6 11.4 - 15.2 seconds   INR 1.04   Type and screen [place]     Status: None   Collection Time: 02/11/17  9:48 PM  Result Value Ref Range   ABO/RH(D) A POS    Antibody Screen NEG    Sample Expiration 02/14/2017   ABO/Rh     Status: None   Collection Time: 02/11/17  9:48 PM  Result Value Ref Range   ABO/RH(D) A POS   Urinalysis, Routine w reflex microscopic     Status: Abnormal   Collection Time: 02/12/17  2:34 AM  Result Value Ref Range   Color, Urine YELLOW YELLOW   APPearance HAZY (A) CLEAR   Specific Gravity, Urine 1.017 1.005 - 1.030   pH 6.0 5.0 - 8.0   Glucose,  UA NEGATIVE NEGATIVE mg/dL   Hgb urine dipstick SMALL (A) NEGATIVE   Bilirubin Urine NEGATIVE NEGATIVE   Ketones, ur 5 (A) NEGATIVE mg/dL   Protein, ur NEGATIVE NEGATIVE mg/dL   Nitrite POSITIVE (A) NEGATIVE   Leukocytes, UA NEGATIVE NEGATIVE   RBC / HPF 0-5 0 - 5 RBC/hpf   WBC, UA 0-5 0 - 5 WBC/hpf   Bacteria, UA RARE (A) NONE SEEN   Squamous Epithelial / LPF NONE SEEN NONE SEEN   Mucous PRESENT   Basic metabolic panel     Status: Abnormal   Collection Time: 02/12/17  5:32 AM  Result Value Ref Range   Sodium 140 135 - 145 mmol/L   Potassium 4.1 3.5 - 5.1 mmol/L   Chloride 107 101 - 111 mmol/L   CO2 25 22 - 32 mmol/L   Glucose, Bld 131 (H) 65 - 99 mg/dL   BUN 10 6 - 20 mg/dL   Creatinine, Ser 0.56 0.44 - 1.00 mg/dL   Calcium 8.6 (L) 8.9 - 10.3 mg/dL   GFR calc non Af Amer >60 >60 mL/min   GFR calc Af Amer >60 >60 mL/min    Comment: (NOTE) The eGFR has been calculated using the CKD EPI equation. This calculation has not been validated in all clinical situations. eGFR's persistently <60 mL/min signify possible Chronic Kidney Disease.    Anion gap 8 5 - 15  CBC     Status: Abnormal   Collection Time: 02/12/17  5:32 AM  Result Value Ref Range   WBC 11.2 (H) 4.0 - 10.5 K/uL   RBC 3.25 (L) 3.87 - 5.11 MIL/uL   Hemoglobin 11.1 (L) 12.0 - 15.0 g/dL   HCT 32.5 (L) 36.0 - 46.0 %   MCV 100.0 78.0 - 100.0 fL   MCH 34.2 (H) 26.0 - 34.0 pg   MCHC 34.2 30.0 - 36.0 g/dL   RDW 12.7 11.5 - 15.5 %   Platelets 184 150 - 400 K/uL  Protime-INR     Status: None   Collection Time: 02/12/17  5:32 AM  Result Value Ref Range   Prothrombin Time 13.5 11.4 - 15.2 seconds   INR 1.03   APTT     Status: None   Collection Time: 02/12/17  5:32 AM  Result Value Ref Range   aPTT 31 24 - 36 seconds    Dg Chest Port 1 View  Result Date: 02/11/2017 CLINICAL DATA:  Pain following fall EXAM: PORTABLE CHEST 1 VIEW COMPARISON:  None. FINDINGS: Lungs are clear. Heart size and pulmonary vascularity are  normal. No adenopathy. There is aortic atherosclerosis. No pneumothorax. There old healed rib fractures bilaterally. No acute fracture evident. There is postoperative change in the proximal left humerus. IMPRESSION: No edema or consolidation. Old healed fractures bilaterally. No pneumothorax. There is aortic atherosclerosis. Aortic Atherosclerosis (ICD10-I70.0). Electronically Signed   By: Lowella Grip III M.D.   On: 02/11/2017 21:21   Dg Hip Unilat With Pelvis 2-3 Views Left  Result Date: 02/11/2017 CLINICAL DATA:  Left hip pain with external rotation and shortening after a fall. EXAM: DG HIP (WITH OR WITHOUT PELVIS) 2-3V LEFT COMPARISON:  None. FINDINGS: Obliquely oriented acute fracture of the left proximal femur extending from greater trochanter to the medial proximal diaphysis with lateral apex angulation. The left hip prosthesis is well seated in the acetabular fossa without dislocation of the prosthesis. Right proximal femur prosthesis is partially visualized and appears intact. No pelvic fracture or diastases identified. IMPRESSION: Obliquely oriented acute fracture of the left proximal femur extending from greater trochanter to the medial proximal diaphysis with lateral apex angulation. Electronically Signed   By: Kristine Garbe M.D.   On: 02/11/2017 20:49    Review of Systems  Constitutional: Negative.   HENT: Negative.   Eyes: Negative.   Respiratory: Negative.   Cardiovascular: Negative.   Gastrointestinal: Negative.   Genitourinary: Negative.   Musculoskeletal: Positive for falls and joint pain.  Skin: Negative.   Neurological: Negative.   Endo/Heme/Allergies: Negative.   Psychiatric/Behavioral: Negative.    Blood pressure 131/64, pulse 72, temperature 98.5 F (36.9 C), temperature source Oral, resp. rate 16, SpO2 99 %. Physical Exam  Constitutional: She is oriented to person, place, and time. She appears well-developed.  HENT:  Head: Normocephalic.  Eyes: EOM  are normal.  Neck: Normal range of motion.  Cardiovascular: Normal rate and intact distal pulses.   Respiratory: Effort normal.  GI: Soft.  Genitourinary:  Genitourinary Comments: deferred  Musculoskeletal:  Left hip pain and tenderness to touch. Edema left hip. LLE 2+ pedal pulse. Plantar and dorsi flexion intact.  Neurological: She is alert and oriented to person, place, and time.  Skin: Skin is dry.  Psychiatric: Her behavior is normal.    Assessment/Plan: Left hip periprosthetic fracture: NWB NPO MN Pain management Plan for ORIF vs Revision tomorrow by Dr.OLIN or Dr.Swinteck Continue current care Admitted to Medicine   Wyatt Portela L 02/12/2017, 12:27 PM

## 2017-02-12 NOTE — H&P (Addendum)
History and Physical    [name] BPZ:[id number] DOB: 1951/02/21 DOA: 02/11/2017  PCP: Dr. Noni Saupe at [facility] Patient coming from: Home   Chief Complaint: Hip pain, fall  HPI: Samantha Atkins is a 66 y.o. female with medical history significant of osteoporosis with hip fracture in the past, migraine, depression, arthritis who presents after a fall.  Her husband fills in part of the story.  Apparently today, they were together in the bedroom when Samantha Atkins seemed to stumble on her feet (though was standing still) and fell on her left side and bumped her head on a rocking chair.  She had no loss of consciousness, just seemed to lose her balance.  She reports prior to this, she was in her normal state of health and was being active today.  She developed severe left hip pain and was brought to the ED.  She denies any fever, chills, recent illness, nausea, vomiting, constipation, headache, dizziness, LOC, seizure like activity, reflux, leg swelling.    Of note, since being in the ED, she has had difficulty with feeling the need to urinate, but not being able to. She has no abdominal pain or swelling.  She does not note dysuria or increased frequency of urination.    ED Course: In the ED, she was noted to have a leukocytosis to 13.7 with a neutrophil predominance.  She had a hip xray which showed femur fracture but with well seated hip prosthesis.  She had a CXR without lung pathology, but noted aortic atherosclerosis.    Review of Systems: As per HPI otherwise 10 point review of systems negative.   Past Medical History:  Diagnosis Date  . Anxiety   . Arthritis    hands  . Depression   . Fracture of humerus, proximal, left, closed 08/07/2015  . Fracture, humerus closed 07-31-15 fell   left  . GERD (gastroesophageal reflux disease)   . Headache   . Insomnia   . Migraines   . Osteoporosis     Past Surgical History:  Procedure Laterality Date  . ABDOMINAL HYSTERECTOMY    . FEMUR FRACTURE SURGERY  Left    fell, redo hip replacement  . JOINT REPLACEMENT Bilateral    hip  . ORIF DISTAL RADIUS FRACTURE Left 03-2015  . ORIF HUMERUS FRACTURE Left 08/07/2015   Procedure: LEFT OPEN REDUCTION INTERNAL FIXATION (ORIF) PROXIMAL HUMERUS FRACTURE;  Surgeon: Marchia Bond, MD;  Location: [facility];  Service: Orthopedics;  Laterality: Left;  . TONSILLECTOMY     Reviewed with patient.   reports that she has never smoked. She has never used smokeless tobacco. She reports that she does not drink alcohol or use drugs.  Allergies  Allergen Reactions  . Dust Mite Extract Cough   Reviewed with patient Family History  Problem Relation Age of Onset  . Hip fracture Mother   . Heart Problems Father     Prior to Admission medications   Medication Sig Start Date End Date Taking? Authorizing Provider  butalbital-acetaminophen-caffeine (FIORICET, ESGIC) 50-325-40 MG tablet Take 1 tablet by mouth every 6 (six) hours as needed for migraine.    Yes [provider]  cholecalciferol (VITAMIN D) 1000 units tablet Take 1,000 Units by mouth daily.   Yes [provider]  estradiol (ESTRACE) 1 MG tablet Take 1 mg by mouth daily.   Yes [provider]  ferrous sulfate 325 (65 FE) MG tablet Take 325 mg by mouth daily with breakfast.   Yes [provider]  Multiple Vitamin (MULTIVITAMIN WITH MINERALS) TABS tablet Take 1 tablet by mouth daily.   Yes [provider]  potassium chloride SA (K-DUR,KLOR-CON) 20 MEQ tablet Take 20 mEq by mouth daily.   Yes [provider]  sertraline (ZOLOFT) 100 MG tablet Take 100 mg by mouth daily.   Yes [provider]    Physical Exam: Vitals:   02/11/17 2330 02/11/17 2345 02/12/17 0015 02/12/17 0023  BP:    109/65  Pulse: 76 74 69 73  Resp: 13 19 17 18   Temp:      TempSrc:      SpO2: 96% 97% 98% 98%    Constitutional: NAD, calm, comfortable, thin elderly woman.  Vitals:   02/11/17 2330 02/11/17  2345 02/12/17 0015 02/12/17 0023  BP:    109/65  Pulse: 76 74 69 73  Resp: 13 19 17 18   Temp:      TempSrc:      SpO2: 96% 97% 98% 98%   Eyes: PERRL, lids and conjunctivae normal, anicteric sclerae ENMT: Mucous membranes are mildly dry.  Left ear with bruise to the pinna and pain with movement of pinna.  No pain with palpation of skull or bruising over skull behind ear.  Neck: normal, supple without pain with movement.  She has no tenderness to palpation over C spine.  Respiratory: clear to auscultation bilaterally, anteriorly, could not move over due to hip fracture, no wheezing, no crackles. Normal respiratory effort.  Cardiovascular: Regular rate and rhythm, no murmurs / rubs / gallops. No extremity edema. 2+ pedal pulses.  Abdomen: no tenderness, no masses palpated. +BS Musculoskeletal: no clubbing / cyanosis. + shortening of left limb, + external rotation at hip on the left Skin: no rashes, lesions, ulcers Neurologic: CN 2-12 grossly intact. Sensation intact in LE, able to move toes bilaterally.   Psychiatric: Normal judgment and insight. Alert and oriented x 3.    Labs on Admission: I have personally reviewed following labs and imaging studies  CBC:  Recent Labs Lab 02/11/17 2148  WBC 13.7*  NEUTROABS 11.8*  HGB 11.5*  HCT 32.9*  MCV 99.1  PLT 676   Basic Metabolic Panel:  Recent Labs Lab 02/11/17 2148  NA 142  K 3.6  CL 109  CO2 23  GLUCOSE 121*  BUN 15  CREATININE 0.48  CALCIUM 8.3*   GFR: CrCl cannot be calculated (Unknown ideal weight.). Liver Function Tests: No results for input(s): AST, ALT, ALKPHOS, BILITOT, PROT, ALBUMIN in the last 168 hours. No results for input(s): LIPASE, AMYLASE in the last 168 hours. No results for input(s): AMMONIA in the last 168 hours. Coagulation Profile:  Recent Labs Lab 02/11/17 2148  INR 1.04   Cardiac Enzymes: No results for input(s): CKTOTAL, CKMB, CKMBINDEX, TROPONINI in the last 168 hours. BNP (last 3  results) No results for input(s): PROBNP in the last 8760 hours. HbA1C: No results for input(s): HGBA1C in the last 72 hours. CBG: No results for input(s): GLUCAP in the last 168 hours. Lipid Profile: No results for input(s): CHOL, HDL, LDLCALC, TRIG, CHOLHDL, LDLDIRECT in the last 72 hours. Thyroid Function Tests: No results for input(s): TSH, T4TOTAL, FREET4, T3FREE, THYROIDAB in the last 72 hours. Anemia Panel: No results for input(s): VITAMINB12, FOLATE, FERRITIN, TIBC, IRON, RETICCTPCT in the last 72 hours. Urine analysis: No results found for: COLORURINE, APPEARANCEUR, LABSPEC, PHURINE, GLUCOSEU, HGBUR, BILIRUBINUR, KETONESUR, PROTEINUR, UROBILINOGEN, NITRITE, LEUKOCYTESUR  Radiological Exams on Admission: Dg Chest [facility] 1 View  Result  Date: 02/11/2017 CLINICAL DATA:  Pain following fall EXAM: PORTABLE CHEST 1 VIEW COMPARISON:  None. FINDINGS: Lungs are clear. Heart size and pulmonary vascularity are normal. No adenopathy. There is aortic atherosclerosis. No pneumothorax. There old healed rib fractures bilaterally. No acute fracture evident. There is postoperative change in the proximal left humerus. IMPRESSION: No edema or consolidation. Old healed fractures bilaterally. No pneumothorax. There is aortic atherosclerosis. Aortic Atherosclerosis (ICD10-I70.0). Electronically Signed   By: Lowella Grip III M.D.   On: 02/11/2017 21:21   Dg Hip Unilat With Pelvis 2-3 Views Left  Result Date: 02/11/2017 CLINICAL DATA:  Left hip pain with external rotation and shortening after a fall. EXAM: DG HIP (WITH OR WITHOUT PELVIS) 2-3V LEFT COMPARISON:  None. FINDINGS: Obliquely oriented acute fracture of the left proximal femur extending from greater trochanter to the medial proximal diaphysis with lateral apex angulation. The left hip prosthesis is well seated in the acetabular fossa without dislocation of the prosthesis. Right proximal femur prosthesis is partially visualized and appears intact. No  pelvic fracture or diastases identified. IMPRESSION: Obliquely oriented acute fracture of the left proximal femur extending from greater trochanter to the medial proximal diaphysis with lateral apex angulation. Electronically Signed   By: Kristine Garbe M.D.   On: 02/11/2017 20:49    EKG: Independently reviewed. Sinus with frequent PVCs, occasional bigeminy   Assessment/Plan  Fracture of humerus, proximal, left, closed - Admit to inpatient - Gen surgery consulted by the ED - Pain control with morphine, tylenol - Nausea control with Zofran - NPO x meds - Telemetry - Heparin SQ, to be held prior to surgery for DVT ppx.     Osteoporosis - On estradiol and vitamin D, continue    Migraines - Fioricet PRN, continue    Depression - Continue home sertraline  Normocytic anemia - Mild at this time, monitor - Continue iron supplementation daily  Urinary retention - Trial of spontaneous voiding - Bladder scan - If no UOP in 6 hours with IVF, consider foley catheter - UA ordered  Fall, abnormal EKG - Telemetry - EKG in the AM - She adamantly denies LOC, so I do not think syncope work up is needed - She has a bruise on left ear, no signs of scalp/skull injury or neck injury based on my exam tonight.  - If any changes in mentation or headache (currently denies), consider CT scan head given she reports bumping her head - no lesion noted in the ED   DVT prophylaxis: Heparin SQ Code Status: Full Family Communication: Husband, [name], at bedside Disposition Plan: Admit to inpatient, pending surgery, likely will need rehab Consults called: Ortho, Dr. Ronnie Derby Admission status: Inpatient, telemetry   Gilles Chiquito MD [facility] Pager [phone]  If 7PM-7AM, please contact night-coverage [email or web address] Password [email or web address]  02/12/2017, 12:58 AM

## 2017-02-12 NOTE — Progress Notes (Signed)
PROGRESS NOTE    Samantha Atkins  ZLD:[id number] DOB: 05/16/1951 DOA: 02/11/2017 PCP: Patient, No Pcp Per    Brief Narrative:  66 y.o. female with medical history significant of osteoporosis with prior hip fracture, migraine, depression, arthritis who presents after a fall. Patient noted have mechanical fall with subsequent severe left hip pain and was brought to the ED.  She denies any fever, chills, recent illness, nausea, vomiting, constipation, headache, dizziness, LOC, seizure like activity, reflux, leg swelling  Assessment & Plan:   Principal Problem:   Fracture of humerus, proximal, left, closed Active Problems:   Osteoporosis   Migraines   Depression  Fracture of humerus, proximal, left, closed - We'll continue with pain control with morphine, tylenol - Nausea control with Zofran - Orthopedic surgery consulted. Tentative plans for surgery on 02/13/2017.    Osteoporosis - Patient continued on estradiol and vitamin D    Migraines - We'll continue with Fioricet PRN -Stable at present    Depression - Continue home sertraline -Currently stable  Normocytic anemia - Presently stable - Continue iron supplementation daily  Urinary retention - Trial of spontaneous voiding  - Bladder scan - UA reviewed. Not suggestive of UTI  Fall, abnormal EKG - Continue on Telemetry -Stable at present - She adamantly denies LOC, so I do not think syncope work up is needed - She has a bruise on left ear, no signs of scalp/skull injury or neck injury - If any changes in mentation or headache (currently denies), consider CT scan head given she reports bumping her head - no lesion noted in the ED  DVT prophylaxis: Heparin subcutaneous Code Status: Full code Family Communication: Patient in room, family not at bedside Disposition Plan: Uncertain at this time  Consultants:   Orthopedic surgery  Procedures:     Antimicrobials: Anti-infectives    None       Subjective: No  complaints at present  Objective: Vitals:   02/12/17 0129 02/12/17 0158 02/12/17 0454 02/12/17 1432  BP: 118/74 130/61 131/64 (!) 153/79  Pulse: 68 71 72 79  Resp: 16 17 16 18   Temp:  98.2 F (36.8 C) 98.5 F (36.9 C) 98.3 F (36.8 C)  TempSrc:  Oral Oral Oral  SpO2: 98% 98% 99% 99%    Intake/Output Summary (Last 24 hours) at 02/12/17 1541 Last data filed at 02/12/17 1200  Gross per 24 hour  Intake              750 ml  Output             1275 ml  Net             -525 ml   There were no vitals filed for this visit.  Examination:  General exam: Appears calm and comfortable  Respiratory system: Clear to auscultation. Respiratory effort normal. Cardiovascular system: S1 & S2 heard, RRR. Gastrointestinal system: Abdomen is nondistended, soft and nontender. No organomegaly or masses felt. Normal bowel sounds heard. Central nervous system: Alert and oriented. No focal neurological deficits. Extremities: Symmetric 5 x 5 power. Skin: No rashes, lesions Psychiatry: Judgement and insight appear normal. Mood & affect appropriate.   Data Reviewed: I have personally reviewed following labs and imaging studies  CBC:  Recent Labs Lab 02/11/17 2148 02/12/17 0532  WBC 13.7* 11.2*  NEUTROABS 11.8*  --   HGB 11.5* 11.1*  HCT 32.9* 32.5*  MCV 99.1 100.0  PLT 202 903   Basic Metabolic Panel:  Recent Labs Lab 02/11/17  2148 02/12/17 0532  NA 142 140  K 3.6 4.1  CL 109 107  CO2 23 25  GLUCOSE 121* 131*  BUN 15 10  CREATININE 0.48 0.56  CALCIUM 8.3* 8.6*   GFR: CrCl cannot be calculated (Unknown ideal weight.). Liver Function Tests: No results for input(s): AST, ALT, ALKPHOS, BILITOT, PROT, ALBUMIN in the last 168 hours. No results for input(s): LIPASE, AMYLASE in the last 168 hours. No results for input(s): AMMONIA in the last 168 hours. Coagulation Profile:  Recent Labs Lab 02/11/17 2148 02/12/17 0532  INR 1.04 1.03   Cardiac Enzymes: No results for input(s):  CKTOTAL, CKMB, CKMBINDEX, TROPONINI in the last 168 hours. BNP (last 3 results) No results for input(s): PROBNP in the last 8760 hours. HbA1C: No results for input(s): HGBA1C in the last 72 hours. CBG: No results for input(s): GLUCAP in the last 168 hours. Lipid Profile: No results for input(s): CHOL, HDL, LDLCALC, TRIG, CHOLHDL, LDLDIRECT in the last 72 hours. Thyroid Function Tests: No results for input(s): TSH, T4TOTAL, FREET4, T3FREE, THYROIDAB in the last 72 hours. Anemia Panel: No results for input(s): VITAMINB12, FOLATE, FERRITIN, TIBC, IRON, RETICCTPCT in the last 72 hours. Sepsis Labs: No results for input(s): PROCALCITON, LATICACIDVEN in the last 168 hours.  Recent Results (from the past 240 hour(s))  Surgical pcr screen     Status: None   Collection Time: 02/12/17 12:05 PM  Result Value Ref Range Status   MRSA, PCR NEGATIVE NEGATIVE Final   Staphylococcus aureus NEGATIVE NEGATIVE Final    Comment:        The Xpert SA Assay (FDA approved for NASAL specimens in patients over 58 years of age), is one component of a comprehensive surveillance program.  Test performance has been validated by [facility] for patients greater than or equal to 13 year old. It is not intended to diagnose infection nor to guide or monitor treatment.      Radiology Studies: Dg Chest Port 1 View  Result Date: 02/11/2017 CLINICAL DATA:  Pain following fall EXAM: PORTABLE CHEST 1 VIEW COMPARISON:  None. FINDINGS: Lungs are clear. Heart size and pulmonary vascularity are normal. No adenopathy. There is aortic atherosclerosis. No pneumothorax. There old healed rib fractures bilaterally. No acute fracture evident. There is postoperative change in the proximal left humerus. IMPRESSION: No edema or consolidation. Old healed fractures bilaterally. No pneumothorax. There is aortic atherosclerosis. Aortic Atherosclerosis (ICD10-I70.0). Electronically Signed   By: Lowella Grip III M.D.   On:  02/11/2017 21:21   Dg Hip Unilat With Pelvis 2-3 Views Left  Result Date: 02/11/2017 CLINICAL DATA:  Left hip pain with external rotation and shortening after a fall. EXAM: DG HIP (WITH OR WITHOUT PELVIS) 2-3V LEFT COMPARISON:  None. FINDINGS: Obliquely oriented acute fracture of the left proximal femur extending from greater trochanter to the medial proximal diaphysis with lateral apex angulation. The left hip prosthesis is well seated in the acetabular fossa without dislocation of the prosthesis. Right proximal femur prosthesis is partially visualized and appears intact. No pelvic fracture or diastases identified. IMPRESSION: Obliquely oriented acute fracture of the left proximal femur extending from greater trochanter to the medial proximal diaphysis with lateral apex angulation. Electronically Signed   By: Kristine Garbe M.D.   On: 02/11/2017 20:49    Scheduled Meds: . cholecalciferol  1,000 Units Oral Daily  . docusate sodium  100 mg Oral BID  . estradiol  1 mg Oral Daily  . ferrous sulfate  325 mg Oral Q  breakfast  . heparin  5,000 Units Subcutaneous Q8H  . multivitamin with minerals  1 tablet Oral Daily  . potassium chloride SA  20 mEq Oral Daily  . sertraline  100 mg Oral Daily   Continuous Infusions:   LOS: 0 days   Ritchard Paragas, Orpah Melter, MD [facility] Pager [phone]  If 7PM-7AM, please contact night-coverage [email or web address] Password [email or web address] 02/12/2017, 3:41 PM

## 2017-02-12 NOTE — Consult Note (Signed)
NAME: Samantha Atkins MRN:   [id number] DOB:   1950/12/21   CHIEF COMPLAINT:  Left hip pain  HISTORY:   Samantha Atkins a 66 y.o. female  with left  Hip Pain Patient complains of left hip pain. Onset of the symptoms was yesterday. Inciting event: fall. The patient reports the hip pain is worse with weight bearing. Associated symptoms: none, injury. Aggravating symptoms include: any weight bearing. Patient has had prior hip problems. Previous visits for this problem: [facility] Evaluation to date: plain films, which were abnormal  periprosthetic hip fracture. Treatment to date: none.   PAST MEDICAL HISTORY:   Past Medical History:  Diagnosis Date  . Anxiety   . Arthritis    hands  . Depression   . Fracture of humerus, proximal, left, closed 08/07/2015  . Fracture, humerus closed 07-31-15 fell   left  . GERD (gastroesophageal reflux disease)   . Headache   . Insomnia   . Migraines   . Osteoporosis     PAST SURGICAL HISTORY:   Past Surgical History:  Procedure Laterality Date  . ABDOMINAL HYSTERECTOMY    . FEMUR FRACTURE SURGERY Left    fell, redo hip replacement  . JOINT REPLACEMENT Bilateral    hip  . ORIF DISTAL RADIUS FRACTURE Left 03-2015  . ORIF HUMERUS FRACTURE Left 08/07/2015   Procedure: LEFT OPEN REDUCTION INTERNAL FIXATION (ORIF) PROXIMAL HUMERUS FRACTURE;  Surgeon: Marchia Bond, MD;  Location: [facility];  Service: Orthopedics;  Laterality: Left;  . TONSILLECTOMY      MEDICATIONS:   Medications Prior to Admission  Medication Sig Dispense Refill  . butalbital-acetaminophen-caffeine (FIORICET, ESGIC) 50-325-40 MG tablet Take 1 tablet by mouth every 6 (six) hours as needed for migraine.     . cholecalciferol (VITAMIN D) 1000 units tablet Take 1,000 Units by mouth daily.    Marland Kitchen estradiol (ESTRACE) 1 MG tablet Take 1 mg by mouth daily.    . ferrous sulfate 325 (65 FE) MG tablet Take 325 mg by mouth daily with breakfast.    . Multiple Vitamin (MULTIVITAMIN WITH  MINERALS) TABS tablet Take 1 tablet by mouth daily.    . potassium chloride SA (K-DUR,KLOR-CON) 20 MEQ tablet Take 20 mEq by mouth daily.    . sertraline (ZOLOFT) 100 MG tablet Take 100 mg by mouth daily.      ALLERGIES:   Allergies  Allergen Reactions  . Dust Mite Extract Cough    REVIEW OF SYSTEMS:   Negative except left hip pain  FAMILY HISTORY:   Family History  Problem Relation Age of Onset  . Hip fracture Mother   . Heart Problems Father     SOCIAL HISTORY:   reports that she has never smoked. She has never used smokeless tobacco. She reports that she does not drink alcohol or use drugs.  PHYSICAL EXAM:  General appearance: alert, cooperative and no distress Head: Normocephalic, without obvious abnormality, atraumatic Extremities: extremities normal, atraumatic, no cyanosis or edema Pulses: 2+ and symmetric    LABORATORY STUDIES:  Recent Labs  02/11/17 2148 02/12/17 0532  WBC 13.7* 11.2*  HGB 11.5* 11.1*  HCT 32.9* 32.5*  PLT 202 184     Recent Labs  02/11/17 2148 02/12/17 0532  NA 142 140  K 3.6 4.1  CL 109 107  CO2 23 25  GLUCOSE 121* 131*  BUN 15 10  CREATININE 0.48 0.56  CALCIUM 8.3* 8.6*    STUDIES/RESULTS:  Dg Chest Port 1 View  Result Date:  02/11/2017 CLINICAL DATA:  Pain following fall EXAM: PORTABLE CHEST 1 VIEW COMPARISON:  None. FINDINGS: Lungs are clear. Heart size and pulmonary vascularity are normal. No adenopathy. There is aortic atherosclerosis. No pneumothorax. There old healed rib fractures bilaterally. No acute fracture evident. There is postoperative change in the proximal left humerus. IMPRESSION: No edema or consolidation. Old healed fractures bilaterally. No pneumothorax. There is aortic atherosclerosis. Aortic Atherosclerosis (ICD10-I70.0). Electronically Signed   By: Lowella Grip III M.D.   On: 02/11/2017 21:21   Dg Hip Unilat With Pelvis 2-3 Views Left  Result Date: 02/11/2017 CLINICAL DATA:  Left hip pain with external  rotation and shortening after a fall. EXAM: DG HIP (WITH OR WITHOUT PELVIS) 2-3V LEFT COMPARISON:  None. FINDINGS: Obliquely oriented acute fracture of the left proximal femur extending from greater trochanter to the medial proximal diaphysis with lateral apex angulation. The left hip prosthesis is well seated in the acetabular fossa without dislocation of the prosthesis. Right proximal femur prosthesis is partially visualized and appears intact. No pelvic fracture or diastases identified. IMPRESSION: Obliquely oriented acute fracture of the left proximal femur extending from greater trochanter to the medial proximal diaphysis with lateral apex angulation. Electronically Signed   By: Kristine Garbe M.D.   On: 02/11/2017 20:49    ASSESSMENT: left hip periprosthetic fracture  PLAN: will need left hip revision vs left hip ORIF   Lummie Montijo,STEPHEN D 02/12/2017. 9:07 AM

## 2017-02-13 ENCOUNTER — Inpatient Hospital Stay (HOSPITAL_COMMUNITY)
Admit: 2017-02-13 | Discharge: 2017-02-13 | Disposition: A | Discharge status: Home / Self Care | Payer: Commercial Managed Care - PPO | Attending: Internal Medicine | Admitting: Internal Medicine

## 2017-02-13 ENCOUNTER — Encounter (HOSPITAL_COMMUNITY)
Admission: EM | Disposition: A | Discharge status: Home / Self Care | Payer: Self-pay | Source: Home / Self Care | Attending: Internal Medicine

## 2017-02-13 ENCOUNTER — Inpatient Hospital Stay (HOSPITAL_COMMUNITY): Payer: Commercial Managed Care - PPO

## 2017-02-13 DIAGNOSIS — F3342 Major depressive disorder, recurrent, in full remission: Secondary | ICD-10-CM

## 2017-02-13 LAB — MRSA PCR SCREENING: MRSA by PCR: NEGATIVE

## 2017-02-13 LAB — HIV ANTIBODY (ROUTINE TESTING W REFLEX): HIV Screen 4th Generation wRfx: NONREACTIVE

## 2017-02-13 SURGERY — TOTAL HIP REVISION
Anesthesia: General | Site: Hip | Laterality: Left

## 2017-02-13 MED ORDER — LORAZEPAM 2 MG/ML IJ SOLN
2.0000 mg | INTRAMUSCULAR | Status: AC
  Administered 2017-02-13: 2 mg via INTRAVENOUS

## 2017-02-13 MED ORDER — LORAZEPAM 2 MG/ML IJ SOLN: 2.0000 mg | INTRAMUSCULAR | Status: DC | PRN

## 2017-02-13 MED ORDER — POVIDONE-IODINE 10 % EX SWAB
2.0000 "application " | Freq: Once | CUTANEOUS | Status: AC
  Administered 2017-02-14: 2 via TOPICAL

## 2017-02-13 MED ORDER — LORAZEPAM 2 MG/ML IJ SOLN
INTRAMUSCULAR | Status: AC
  Filled 2017-02-13: qty 1

## 2017-02-13 MED ORDER — TRANEXAMIC ACID 1000 MG/10ML IV SOLN
1000.0000 mg | INTRAVENOUS | Status: AC
  Administered 2017-02-14: 1000 mg via INTRAVENOUS
  Filled 2017-02-13 (×2): qty 10

## 2017-02-13 MED ORDER — SODIUM CHLORIDE 0.9 % IV SOLN: INTRAVENOUS | Status: DC

## 2017-02-13 MED ORDER — DEXAMETHASONE SODIUM PHOSPHATE 10 MG/ML IJ SOLN
8.0000 mg | Freq: Once | INTRAMUSCULAR | Status: DC
  Filled 2017-02-13: qty 0.8

## 2017-02-13 MED ORDER — CHLORHEXIDINE GLUCONATE 4 % EX LIQD
60.0000 mL | Freq: Once | CUTANEOUS | Status: AC
  Administered 2017-02-14: 4 via TOPICAL
  Filled 2017-02-13: qty 60

## 2017-02-13 MED ORDER — CEFAZOLIN SODIUM-DEXTROSE 2-4 GM/100ML-% IV SOLN
2.0000 g | INTRAVENOUS | Status: AC
  Administered 2017-02-14: 2 g via INTRAVENOUS
  Filled 2017-02-13: qty 100

## 2017-02-13 MED ORDER — SODIUM CHLORIDE 0.9 % IV SOLN
500.0000 mg | Freq: Two times a day (BID) | INTRAVENOUS | Status: DC
  Administered 2017-02-13 – 2017-02-15 (×3): 500 mg via INTRAVENOUS
  Filled 2017-02-13 (×5): qty 5

## 2017-02-13 MED ORDER — SODIUM CHLORIDE 0.9 % IV SOLN
500.0000 mg | Freq: Two times a day (BID) | INTRAVENOUS | Status: DC
  Filled 2017-02-13: qty 5

## 2017-02-13 NOTE — Procedures (Signed)
ELECTROENCEPHALOGRAM REPORT  Patient: Samantha Atkins       Room #: VO3500 EEG No. ID: [id number] Age: 66 y.o.        Sex: female Referring Physician: Rhetta Mura K Report Date:  02/13/2017        Interpreting Physician: Anthony Sar  History: Samantha Atkins is an 66 y.o. female admitted following a fall and injury to the left hip. She has been experiencing recurrent episodes of seizure-like activity with staring and grading of her teeth, with improvement in responsiveness following 2 mg of Ativan.  Indications for study:  Rule out seizure disorder.  Technique: This is an 18 channel routine scalp EEG performed at the bedside with bipolar and monopolar montages arranged in accordance to the international 10/20 system of electrode placement.   Description: This EEG recording was performed during wakefulness. Patient was noted to be moderately agitated during the study. Predominant cortical activity consisted of diffuse mixed irregular delta and theta activity with superimposed diffuse low amplitude beta activity as well as intermittent runs of activity in the alpha range recorded from the posterior head regions. Considerable muscle artifact was also recorded during this study. Photic stimulation and hyperventilation were not performed. No epileptiform discharges were recorded.  Interpretation: EEG is abnormal with mild to moderate generalized nonspecific continuous slowing of cerebral activity which can be seen with postictal state, as well as with metabolic or toxic encephalopathy. No evidence of seizure activity was recorded. The absence of seizure activity during this recording does not rule out seizure disorder, however.   Rush Farmer M.D. [facility] [phone]

## 2017-02-13 NOTE — Progress Notes (Signed)
EEG completed, results pending. 

## 2017-02-13 NOTE — Progress Notes (Signed)
PROGRESS NOTE    Samantha Atkins  RKY:[id number] DOB: [birth year] DOA: 02/11/2017 PCP: Patient, No Pcp Per    Brief Narrative:  66 y.o. female with medical history significant of osteoporosis with prior hip fracture, migraine, depression, arthritis who presents after a fall. Patient noted have mechanical fall with subsequent severe left hip pain and was brought to the ED.  She denies any fever, chills, recent illness, nausea, vomiting, constipation, headache, dizziness, LOC, seizure like activity, reflux, leg swelling  Assessment & Plan:   Principal Problem:   Fracture of humerus, proximal, left, closed Active Problems:   Osteoporosis   Migraines   Depression  Fracture of humerus, proximal, left, closed - We'll continue with pain control with morphine, tylenol - Nausea control with Zofran - Orthopedic surgery consulted. Discussed case with Dr. Alvan Dame - Plan for surgery 7/10 - Stable this AM    Osteoporosis - Patient continued on estradiol and vitamin D - Stable at present    Migraines - We'll continue with Fioricet PRN - Stable currently    Depression - Continue home sertraline - Presently stable  Normocytic anemia - Labs reviewed. Stable at present - Will continue with iron supplementation daily  Urinary retention - Presently stable - UA reviewed. Not suggestive of UTI - Continue to monitor  Fall, abnormal EKG - Continue on Telemetry - Remains stable at present  DVT prophylaxis: Heparin subcutaneous Code Status: Full code Family Communication: Patient in room, family not at bedside Disposition Plan: Uncertain at this time  Consultants:   Orthopedic surgery  Procedures:     Antimicrobials: Anti-infectives    None      Subjective: Without complaints. Eager to have surgery  Objective: Vitals:   02/12/17 1432 02/12/17 2105 02/13/17 0459 02/13/17 1338  BP: (!) 153/79 139/75 127/65 134/72  Pulse: 79 77 86 84  Resp: 18 18 18 20   Temp: 98.3 F (36.8  C) 98.7 F (37.1 C) 98.8 F (37.1 C) 98.1 F (36.7 C)  TempSrc: Oral Oral Oral Oral  SpO2: 99% 97% 95% 100%    Intake/Output Summary (Last 24 hours) at 02/13/17 1351 Last data filed at 02/13/17 1300  Gross per 24 hour  Intake              240 ml  Output             1350 ml  Net            -1110 ml   There were no vitals filed for this visit.  Examination: General exam: Conversant, in no acute distress Respiratory system: normal chest rise, clear, no audible wheezing Cardiovascular system: regular rhythm, s1-s2 Gastrointestinal system: Nondistended, nontender, pos BS Central nervous system: No seizures, no tremors Extremities: No cyanosis, no joint deformities Skin: No rashes, no pallor Psychiatry: Affect normal // no auditory hallucinations    Data Reviewed: I have personally reviewed following labs and imaging studies  CBC:  Recent Labs Lab 02/11/17 2148 02/12/17 0532  WBC 13.7* 11.2*  NEUTROABS 11.8*  --   HGB 11.5* 11.1*  HCT 32.9* 32.5*  MCV 99.1 100.0  PLT 202 315   Basic Metabolic Panel:  Recent Labs Lab 02/11/17 2148 02/12/17 0532  NA 142 140  K 3.6 4.1  CL 109 107  CO2 23 25  GLUCOSE 121* 131*  BUN 15 10  CREATININE 0.48 0.56  CALCIUM 8.3* 8.6*   GFR: CrCl cannot be calculated (Unknown ideal weight.). Liver Function Tests: No results for input(s): AST, ALT,  ALKPHOS, BILITOT, PROT, ALBUMIN in the last 168 hours. No results for input(s): LIPASE, AMYLASE in the last 168 hours. No results for input(s): AMMONIA in the last 168 hours. Coagulation Profile:  Recent Labs Lab 02/11/17 2148 02/12/17 0532  INR 1.04 1.03   Cardiac Enzymes: No results for input(s): CKTOTAL, CKMB, CKMBINDEX, TROPONINI in the last 168 hours. BNP (last 3 results) No results for input(s): PROBNP in the last 8760 hours. HbA1C: No results for input(s): HGBA1C in the last 72 hours. CBG: No results for input(s): GLUCAP in the last 168 hours. Lipid Profile: No  results for input(s): CHOL, HDL, LDLCALC, TRIG, CHOLHDL, LDLDIRECT in the last 72 hours. Thyroid Function Tests: No results for input(s): TSH, T4TOTAL, FREET4, T3FREE, THYROIDAB in the last 72 hours. Anemia Panel: No results for input(s): VITAMINB12, FOLATE, FERRITIN, TIBC, IRON, RETICCTPCT in the last 72 hours. Sepsis Labs: No results for input(s): PROCALCITON, LATICACIDVEN in the last 168 hours.  Recent Results (from the past 240 hour(s))  Surgical pcr screen     Status: None   Collection Time: 02/12/17 12:05 PM  Result Value Ref Range Status   MRSA, PCR NEGATIVE NEGATIVE Final   Staphylococcus aureus NEGATIVE NEGATIVE Final    Comment:        The Xpert SA Assay (FDA approved for NASAL specimens in patients over 79 years of age), is one component of a comprehensive surveillance program.  Test performance has been validated by [facility] for patients greater than or equal to 9 year old. It is not intended to diagnose infection nor to guide or monitor treatment.      Radiology Studies: Dg Chest Port 1 View  Result Date: 02/11/2017 CLINICAL DATA:  Pain following fall EXAM: PORTABLE CHEST 1 VIEW COMPARISON:  None. FINDINGS: Lungs are clear. Heart size and pulmonary vascularity are normal. No adenopathy. There is aortic atherosclerosis. No pneumothorax. There old healed rib fractures bilaterally. No acute fracture evident. There is postoperative change in the proximal left humerus. IMPRESSION: No edema or consolidation. Old healed fractures bilaterally. No pneumothorax. There is aortic atherosclerosis. Aortic Atherosclerosis (ICD10-I70.0). Electronically Signed   By: Lowella Grip III M.D.   On: 02/11/2017 21:21   Dg Hip Unilat With Pelvis 2-3 Views Left  Result Date: 02/11/2017 CLINICAL DATA:  Left hip pain with external rotation and shortening after a fall. EXAM: DG HIP (WITH OR WITHOUT PELVIS) 2-3V LEFT COMPARISON:  None. FINDINGS: Obliquely oriented acute fracture of the left  proximal femur extending from greater trochanter to the medial proximal diaphysis with lateral apex angulation. The left hip prosthesis is well seated in the acetabular fossa without dislocation of the prosthesis. Right proximal femur prosthesis is partially visualized and appears intact. No pelvic fracture or diastases identified. IMPRESSION: Obliquely oriented acute fracture of the left proximal femur extending from greater trochanter to the medial proximal diaphysis with lateral apex angulation. Electronically Signed   By: Kristine Garbe M.D.   On: 02/11/2017 20:49    Scheduled Meds: . cholecalciferol  1,000 Units Oral Daily  . docusate sodium  100 mg Oral BID  . estradiol  1 mg Oral Daily  . ferrous sulfate  325 mg Oral Q breakfast  . heparin  5,000 Units Subcutaneous Q8H  . multivitamin with minerals  1 tablet Oral Daily  . potassium chloride SA  20 mEq Oral Daily  . sertraline  100 mg Oral Daily   Continuous Infusions:   LOS: 1 day   CHIU, Orpah Melter, MD [facility]  Pager [phone]  If 7PM-7AM, please contact night-coverage [email or web address] Password [email or web address] 02/13/2017, 1:51 PM

## 2017-02-13 NOTE — Progress Notes (Signed)
Patient's heart monitor reading heart rate in 150's.  Came to room to find patient drooling, non responsive, with eyes wide open.  Initial drool had slight blood tinge.  Rapid response called, patient became more repsponsive, but would not follow command and was not comprehending anything.  Patient now confused but able to respond and comprehend.  Dr. Wyline Copas, at bedside ordered 2 mg ativan and Keppra.

## 2017-02-13 NOTE — Treatment Plan (Signed)
Called to bedside with seizure-like episode with patient staring spell and gritting teeth, tachycardia. Given 2mg  ativan with gradual improvement. Patient now answering questions slowly and appears post-ictal. Will order head CT, EEG. Discussed case with Neurology who agrees with head CT and recommends follow up MRI at some point. No recommendation for scheduled AED given concern for new seizure. Will continue to monitor

## 2017-02-13 NOTE — Progress Notes (Signed)
Patient ID: Samantha Atkins, female   DOB: December 02, 1950, 66 y.o.   MRN: [id number]  Left peri-prosthetic proximal femur fracture Definitely ready to get her hip fixed Uncomfortable Husband in room  Labs ok for now but will repeat CBC in am  LLE external rotated  No motion examined NVI  Plan: Needs revision with ORIF left proximal femur Plan to address tomorrow as add on Urgent orders completed Reviewed plan, consent to be obtained based on discussion

## 2017-02-13 NOTE — Progress Notes (Signed)
Re-ordered EEG; wrong order was completed. Patient will be done tomorrow as schedule permits.

## 2017-02-13 NOTE — Consult Note (Signed)
Neurology Consultation Reason for Consult: seizures  Referring Physician: Dr. Wyline Copas  CC: staring spell  History is obtained from: patient and husband who was at bedside with the patient.  HPI: Samantha Atkins is a 66 y.o. female with PMH of osteoporosis, depression, glaucoma, GERD who was admitted to [facility] after a mechanical fall on Saturday 02/11/17. She does not recollect how she fell, but she fell hard and had a left hip fracture. She has a pre-existing hip replacement on that side, which is now shattered per her. Neurological service was consulted for evaluation of seizure like episodes. She was in her usual state of health in the hospital getting ready for surgery tomorrow when she had an episode of staring straight ahead, not responding to voice, drooling from her mouth and also some hand wringing movements. She was given Ativan 2mg , IV which broke the seizure but she continued to be a little confused immediately after and came about to normal in a few minutes. She has had these kind of episodes for many months to years, and her husband had attributed that to her serious psychiatric history and planned to discuss it with a neurophsycologist or some other physician but never got around to getting that consultation. She was back to baseline at the time of this consultation.  She reports severe depression and anxiety. She has history of suicidal ideation but not right now. She never has had a plan.   She has lost over 20 pounds in the last 5 years, she has always been thin, but has had more accelerated weight loss in the last 4-5 years.  According to husband, she has had multiple falls, and the one on Saturday was the first one witnessed. They were in bedroom, she was standing up, he was sitting on bed. She seemed to lose her balance and fell like a "tree fall". She was quiet for a few seconds prior which was an awkward pause in the conversation. He did not note any gaze preference or eye rolling. No  tonic clonic movements. No loss of bladder or bowel control.   She got a EEG this evening, and during the EEG, she had episode of rightward gaze deviation and unresponsiveness, that resolved without Ativan.    LKW: NA tpa given?: no - not stroke Premorbid modified rankin scale: 0 ICH Score: na    ROS: A 14 point ROS was performed and is negative except as noted in the HPI  Past Medical History:  Diagnosis Date  . Anxiety   . Arthritis    hands  . Depression   . Fracture of humerus, proximal, left, closed 08/07/2015  . Fracture, humerus closed 07-31-15 fell   left  . GERD (gastroesophageal reflux disease)   . Headache   . Insomnia   . Migraines   . Osteoporosis      Family History  Problem Relation Age of Onset  . Hip fracture Mother   . Heart Problems Father     Social History:  reports that she has never smoked. She has never used smokeless tobacco. She reports that she does not drink alcohol or use drugs.   Exam: Current vital signs: BP 127/78   Pulse (!) 116   Temp 98.1 F (36.7 C) (Oral)   Resp 20   SpO2 99%  Vital signs in last 24 hours: Temp:  [98.1 F (36.7 C)-98.8 F (37.1 C)] 98.1 F (36.7 C) (07/09 1338) Pulse Rate:  [77-116] 116 (07/09 1624) Resp:  [18-20] 20 (  07/09 1338) BP: (127-139)/(65-78) 127/78 (07/09 1624) SpO2:  [95 %-100 %] 99 % (07/09 1624)   Physical Exam  Constitutional: thin looking, +pallor on conjunctiva.  Psych: anxious  Eyes: No scleral injection HENT: No OP obstrucion Head: Normocephalic.  Cardiovascular: Normal rate and regular rhythm.  Respiratory: Effort normal and breath sounds normal to anterior ascultation GI: Soft.  No distension. There is no tenderness.  Skin: WDI  Neuro: Mental Status: Patient is awake, alert, oriented to person, place, month, year, and situation. Patient is able to give a clear and coherent history. No signs of aphasia or neglect Cranial Nerves: II: Visual Fields are full. Pupils are  equal, round, and reactive to light.  III,IV, VI: EOMI without ptosis or diploplia.  V: Facial sensation is symmetric to temperature VII: Facial movement is symmetric.  VIII: hearing is intact to voice X: Uvula elevates symmetrically XI: Shoulder shrug is symmetric. XII: tongue is midline without atrophy or fasciculations.  Motor: Tone is normal. Bulk is reduced all over. 5/5 strength was present in all four extremities - with exception of LLE which is limited range of motion due to severe pain.  Sensory: Sensation is symmetric to light touch and temperature in the arms and legs Deep Tendon Reflexes: 2+ and symmetric in the biceps and patellae.  Plantars: Toes are downgoing bilaterally. Cerebellar: FNF intact b/l  Medications Sertraline 100 mg once a day Vit D Estradiol   I have reviewed labs in epic and the results pertinent to this consultation are: CBC    Component Value Date/Time   WBC 11.2 (H) 02/12/2017 0532   RBC 3.25 (L) 02/12/2017 0532   HGB 11.1 (L) 02/12/2017 0532   HCT 32.5 (L) 02/12/2017 0532   PLT 184 02/12/2017 0532   MCV 100.0 02/12/2017 0532   MCH 34.2 (H) 02/12/2017 0532   MCHC 34.2 02/12/2017 0532   RDW 12.7 02/12/2017 0532   LYMPHSABS 1.1 02/11/2017 2148   MONOABS 0.9 02/11/2017 2148   EOSABS 0.0 02/11/2017 2148   BASOSABS 0.0 02/11/2017 2148   CMP     Component Value Date/Time   NA 140 02/12/2017 0532   K 4.1 02/12/2017 0532   CL 107 02/12/2017 0532   CO2 25 02/12/2017 0532   GLUCOSE 131 (H) 02/12/2017 0532   BUN 10 02/12/2017 0532   CREATININE 0.56 02/12/2017 0532   CALCIUM 8.6 (L) 02/12/2017 0532   GFRNONAA >60 02/12/2017 0532   GFRAA >60 02/12/2017 0532   Imaging No brain imaging available for review.  Impression: 66/F with a  PMH of osteoporosis, depression, glaucoma, GERD who was admitted to [facility] after a mechanical fall on Saturday 02/11/17 for treatment of hip fracture. While in hospital she had episodes of staring and  unresponsiveness along with some atypical movements of her hands, these symptoms resolved with Ativan and she came about in a few minutes. She has had similar episodes in past but family attributed it to her psychiatric illness. It is my suspicion that these are seizures, and her fall on Saturday and prior falls also might have been due to seizures. Another possibility if syncope.  Impression Possible Seizure Evaluate for syncope Depression, Anxiety Left hip fracture Osteoporosis Weight loss (depression vs. Other cause)  Recs -Ativan as needed - if seizure >66min -Keppra 500 BID -Can increase Keppra if episodes not controlled by current dose - can go up to 1g BID. -MRI brain w+w/o contrast -  to look for structural lesion - especially because she has had episodes  for a while and also has had weight loss (?mets/?mass) -Seizure precautions. -No driving per [place] law duration. -Outpatient neurology follow up. -EEG done - results pending. Please call after results are available. -Consider outpatient cardiology consult for syncope w/u. -Consider discontinuing or decreasing Sertraline as it can lower seizure threshold.  Discussed plan with the patient and husband and answered their questions.  Call with questions.  -- Amie Portland, MD [facility] [phone]  If 7pm to 7am, please call on call as listed on AMION.

## 2017-02-14 ENCOUNTER — Inpatient Hospital Stay (HOSPITAL_COMMUNITY): Payer: Commercial Managed Care - PPO | Admitting: Certified Registered Nurse Anesthetist

## 2017-02-14 ENCOUNTER — Inpatient Hospital Stay (HOSPITAL_COMMUNITY): Payer: Commercial Managed Care - PPO

## 2017-02-14 ENCOUNTER — Encounter (HOSPITAL_COMMUNITY): Payer: Self-pay | Admitting: Certified Registered Nurse Anesthetist

## 2017-02-14 ENCOUNTER — Encounter (HOSPITAL_COMMUNITY)
Admission: EM | Disposition: A | Discharge status: Home / Self Care | Payer: Self-pay | Source: Home / Self Care | Attending: Internal Medicine

## 2017-02-14 DIAGNOSIS — R569 Unspecified convulsions: Secondary | ICD-10-CM

## 2017-02-14 DIAGNOSIS — Z419 Encounter for procedure for purposes other than remedying health state, unspecified: Secondary | ICD-10-CM

## 2017-02-14 HISTORY — PX: TOTAL HIP REVISION: SHX763

## 2017-02-14 LAB — POCT I-STAT EG7
ACID-BASE DEFICIT: 4 mmol/L — AB (ref 0.0–2.0)
BICARBONATE: 21.3 mmol/L (ref 20.0–28.0)
CALCIUM ION: 1.11 mmol/L — AB (ref 1.15–1.40)
HEMATOCRIT: 29 % — AB (ref 36.0–46.0)
HEMOGLOBIN: 9.9 g/dL — AB (ref 12.0–15.0)
O2 Saturation: 99 %
POTASSIUM: 4.9 mmol/L (ref 3.5–5.1)
Patient temperature: 37.8
SODIUM: 132 mmol/L — AB (ref 135–145)
TCO2: 22 mmol/L (ref 0–100)
pCO2, Ven: 39.2 mmHg — ABNORMAL LOW (ref 44.0–60.0)
pH, Ven: 7.347 (ref 7.250–7.430)
pO2, Ven: 138 mmHg — ABNORMAL HIGH (ref 32.0–45.0)

## 2017-02-14 LAB — CBC
HCT: 22.2 % — ABNORMAL LOW (ref 36.0–46.0)
Hemoglobin: 8.1 g/dL — ABNORMAL LOW (ref 12.0–15.0)
MCH: 35.2 pg — ABNORMAL HIGH (ref 26.0–34.0)
MCHC: 36.5 g/dL — ABNORMAL HIGH (ref 30.0–36.0)
MCV: 96.5 fL (ref 78.0–100.0)
PLATELETS: 136 10*3/uL — AB (ref 150–400)
RBC: 2.3 MIL/uL — AB (ref 3.87–5.11)
RDW: 12.5 % (ref 11.5–15.5)
WBC: 10 10*3/uL (ref 4.0–10.5)

## 2017-02-14 LAB — BASIC METABOLIC PANEL
ANION GAP: 13 (ref 5–15)
BUN: 14 mg/dL (ref 6–20)
CALCIUM: 8.5 mg/dL — AB (ref 8.9–10.3)
CO2: 23 mmol/L (ref 22–32)
Chloride: 97 mmol/L — ABNORMAL LOW (ref 101–111)
Creatinine, Ser: 0.49 mg/dL (ref 0.44–1.00)
Glucose, Bld: 102 mg/dL — ABNORMAL HIGH (ref 65–99)
POTASSIUM: 3.8 mmol/L (ref 3.5–5.1)
SODIUM: 133 mmol/L — AB (ref 135–145)

## 2017-02-14 LAB — PREPARE RBC (CROSSMATCH)

## 2017-02-14 SURGERY — TOTAL HIP REVISION
Anesthesia: General | Site: Hip | Laterality: Left

## 2017-02-14 MED ORDER — PROPOFOL 10 MG/ML IV BOLUS
INTRAVENOUS | Status: AC
  Filled 2017-02-14: qty 20

## 2017-02-14 MED ORDER — METHOCARBAMOL 1000 MG/10ML IJ SOLN
500.0000 mg | Freq: Four times a day (QID) | INTRAVENOUS | Status: DC | PRN
  Filled 2017-02-14: qty 5

## 2017-02-14 MED ORDER — ROCURONIUM BROMIDE 50 MG/5ML IV SOSY
PREFILLED_SYRINGE | INTRAVENOUS | Status: AC
  Filled 2017-02-14: qty 5

## 2017-02-14 MED ORDER — CEFAZOLIN SODIUM-DEXTROSE 2-4 GM/100ML-% IV SOLN
INTRAVENOUS | Status: AC
  Filled 2017-02-14: qty 100

## 2017-02-14 MED ORDER — SODIUM CHLORIDE 0.9 % IV SOLN: 10.0000 mL/h | Freq: Once | INTRAVENOUS | Status: DC

## 2017-02-14 MED ORDER — PHENOL 1.4 % MT LIQD: 1.0000 | OROMUCOSAL | Status: DC | PRN

## 2017-02-14 MED ORDER — METOCLOPRAMIDE HCL 5 MG/ML IJ SOLN: 5.0000 mg | Freq: Three times a day (TID) | INTRAMUSCULAR | Status: DC | PRN

## 2017-02-14 MED ORDER — LIDOCAINE 2% (20 MG/ML) 5 ML SYRINGE
INTRAMUSCULAR | Status: DC | PRN
  Administered 2017-02-14: 40 mg via INTRAVENOUS

## 2017-02-14 MED ORDER — PROPOFOL 10 MG/ML IV BOLUS
INTRAVENOUS | Status: DC | PRN
  Administered 2017-02-14: 80 mg via INTRAVENOUS

## 2017-02-14 MED ORDER — SODIUM CHLORIDE 0.9 % IV SOLN
10.0000 mL/h | Freq: Once | INTRAVENOUS | Status: AC
  Administered 2017-02-14: 15:00:00 via INTRAVENOUS

## 2017-02-14 MED ORDER — LACTATED RINGERS IV SOLN
INTRAVENOUS | Status: DC | PRN
  Administered 2017-02-14: 14:00:00 via INTRAVENOUS

## 2017-02-14 MED ORDER — METHOCARBAMOL 500 MG PO TABS
500.0000 mg | ORAL_TABLET | Freq: Four times a day (QID) | ORAL | Status: DC | PRN
  Administered 2017-02-15 – 2017-02-16 (×3): 500 mg via ORAL
  Filled 2017-02-14 (×3): qty 1

## 2017-02-14 MED ORDER — HYDROMORPHONE HCL-NACL 0.5-0.9 MG/ML-% IV SOSY
0.2500 mg | PREFILLED_SYRINGE | INTRAVENOUS | Status: DC | PRN
  Administered 2017-02-14: 0.5 mg via INTRAVENOUS
  Administered 2017-02-14 (×2): 0.25 mg via INTRAVENOUS

## 2017-02-14 MED ORDER — ROCURONIUM BROMIDE 50 MG/5ML IV SOSY
PREFILLED_SYRINGE | INTRAVENOUS | Status: DC | PRN
  Administered 2017-02-14 (×2): 20 mg via INTRAVENOUS
  Administered 2017-02-14: 10 mg via INTRAVENOUS
  Administered 2017-02-14: 30 mg via INTRAVENOUS

## 2017-02-14 MED ORDER — LACTATED RINGERS IV SOLN
INTRAVENOUS | Status: DC | PRN
  Administered 2017-02-14 (×2): via INTRAVENOUS

## 2017-02-14 MED ORDER — PHENYLEPHRINE HCL 10 MG/ML IJ SOLN
INTRAVENOUS | Status: DC | PRN
  Administered 2017-02-14: 40 ug/min via INTRAVENOUS

## 2017-02-14 MED ORDER — ONDANSETRON HCL 4 MG/2ML IJ SOLN
INTRAMUSCULAR | Status: AC
  Filled 2017-02-14: qty 2

## 2017-02-14 MED ORDER — ONDANSETRON HCL 4 MG/2ML IJ SOLN
INTRAMUSCULAR | Status: DC | PRN
  Administered 2017-02-14: 4 mg via INTRAVENOUS

## 2017-02-14 MED ORDER — LIDOCAINE 2% (20 MG/ML) 5 ML SYRINGE
INTRAMUSCULAR | Status: AC
  Filled 2017-02-14: qty 5

## 2017-02-14 MED ORDER — ACETAMINOPHEN 10 MG/ML IV SOLN
INTRAVENOUS | Status: AC
  Filled 2017-02-14: qty 100

## 2017-02-14 MED ORDER — METOCLOPRAMIDE HCL 5 MG PO TABS: 5.0000 mg | ORAL_TABLET | Freq: Three times a day (TID) | ORAL | Status: DC | PRN

## 2017-02-14 MED ORDER — 0.9 % SODIUM CHLORIDE (POUR BTL) OPTIME
TOPICAL | Status: DC | PRN
  Administered 2017-02-14: 1000 mL

## 2017-02-14 MED ORDER — FENTANYL CITRATE (PF) 100 MCG/2ML IJ SOLN
INTRAMUSCULAR | Status: AC
  Filled 2017-02-14: qty 2

## 2017-02-14 MED ORDER — LACTATED RINGERS IV SOLN: INTRAVENOUS | Status: DC

## 2017-02-14 MED ORDER — PHENYLEPHRINE 40 MCG/ML (10ML) SYRINGE FOR IV PUSH (FOR BLOOD PRESSURE SUPPORT)
PREFILLED_SYRINGE | INTRAVENOUS | Status: DC | PRN
  Administered 2017-02-14: 80 ug via INTRAVENOUS

## 2017-02-14 MED ORDER — FENTANYL CITRATE (PF) 100 MCG/2ML IJ SOLN
INTRAMUSCULAR | Status: DC | PRN
  Administered 2017-02-14 (×7): 50 ug via INTRAVENOUS

## 2017-02-14 MED ORDER — STERILE WATER FOR IRRIGATION IR SOLN
Status: DC | PRN
  Administered 2017-02-14: 2000 mL

## 2017-02-14 MED ORDER — ACETAMINOPHEN 10 MG/ML IV SOLN
1000.0000 mg | Freq: Once | INTRAVENOUS | Status: AC
  Administered 2017-02-14: 1000 mg via INTRAVENOUS

## 2017-02-14 MED ORDER — SUCCINYLCHOLINE CHLORIDE 200 MG/10ML IV SOSY
PREFILLED_SYRINGE | INTRAVENOUS | Status: AC
  Filled 2017-02-14: qty 10

## 2017-02-14 MED ORDER — MIDAZOLAM HCL 5 MG/5ML IJ SOLN
INTRAMUSCULAR | Status: DC | PRN
  Administered 2017-02-14 (×2): 1 mg via INTRAVENOUS

## 2017-02-14 MED ORDER — CEFAZOLIN SODIUM-DEXTROSE 2-4 GM/100ML-% IV SOLN
2.0000 g | Freq: Four times a day (QID) | INTRAVENOUS | Status: AC
  Administered 2017-02-14 – 2017-02-15 (×2): 2 g via INTRAVENOUS
  Filled 2017-02-14 (×2): qty 100

## 2017-02-14 MED ORDER — PROMETHAZINE HCL 25 MG/ML IJ SOLN: 6.2500 mg | INTRAMUSCULAR | Status: DC | PRN

## 2017-02-14 MED ORDER — ALBUMIN HUMAN 5 % IV SOLN
25.0000 g | Freq: Once | INTRAVENOUS | Status: AC
  Administered 2017-02-14: 25 g via INTRAVENOUS

## 2017-02-14 MED ORDER — ALBUMIN HUMAN 5 % IV SOLN
INTRAVENOUS | Status: AC
  Filled 2017-02-14: qty 250

## 2017-02-14 MED ORDER — FENTANYL CITRATE (PF) 250 MCG/5ML IJ SOLN
INTRAMUSCULAR | Status: AC
  Filled 2017-02-14: qty 5

## 2017-02-14 MED ORDER — SUGAMMADEX SODIUM 200 MG/2ML IV SOLN
INTRAVENOUS | Status: AC
  Filled 2017-02-14: qty 2

## 2017-02-14 MED ORDER — DEXAMETHASONE SODIUM PHOSPHATE 10 MG/ML IJ SOLN
INTRAMUSCULAR | Status: DC | PRN
  Administered 2017-02-14: 8 mg via INTRAVENOUS

## 2017-02-14 MED ORDER — HYDROMORPHONE HCL-NACL 0.5-0.9 MG/ML-% IV SOSY
PREFILLED_SYRINGE | INTRAVENOUS | Status: AC
  Filled 2017-02-14: qty 2

## 2017-02-14 MED ORDER — MIDAZOLAM HCL 2 MG/2ML IJ SOLN
INTRAMUSCULAR | Status: AC
  Filled 2017-02-14: qty 2

## 2017-02-14 MED ORDER — MENTHOL 3 MG MT LOZG: 1.0000 | LOZENGE | OROMUCOSAL | Status: DC | PRN

## 2017-02-14 MED ORDER — SUCCINYLCHOLINE CHLORIDE 200 MG/10ML IV SOSY
PREFILLED_SYRINGE | INTRAVENOUS | Status: DC | PRN
  Administered 2017-02-14: 80 mg via INTRAVENOUS

## 2017-02-14 SURGICAL SUPPLY — 54 items
ADAPTER BIOLOX TAPER  +3MM (Miscellaneous) ×1 IMPLANT
ADAPTER BIOLOX TAPER +3 (Miscellaneous) ×2 IMPLANT
ARCOS ONEPIECE REV SYS HIP +0 (Hips) ×3 IMPLANT
BAG ZIPLOCK 12X15 (MISCELLANEOUS) ×3 IMPLANT
BLADE SAW SGTL 11.0X1.19X90.0M (BLADE) IMPLANT
BLADE SAW SGTL 18X1.27X75 (BLADE) IMPLANT
BLADE SAW SGTL 18X1.27X75MM (BLADE)
CABLE CERLAGE W/CRIMP 1.8 (Cable) ×2 IMPLANT
CABLE CERLAGE W/CRIMP 1.8MM (Cable) ×1 IMPLANT
COVER SURGICAL LIGHT HANDLE (MISCELLANEOUS) ×3 IMPLANT
DERMABOND ADVANCED (GAUZE/BANDAGES/DRESSINGS) ×2
DERMABOND ADVANCED .7 DNX12 (GAUZE/BANDAGES/DRESSINGS) ×1 IMPLANT
DEVICE TROCH REATTACH 4 CABLE (Orthopedic Implant) ×3 IMPLANT
DRAPE ORTHO SPLIT 77X108 STRL (DRAPES) ×4
DRAPE POUCH INSTRU U-SHP 10X18 (DRAPES) ×3 IMPLANT
DRAPE SURG 17X11 SM STRL (DRAPES) ×3 IMPLANT
DRAPE SURG ORHT 6 SPLT 77X108 (DRAPES) ×2 IMPLANT
DRAPE U-SHAPE 47X51 STRL (DRAPES) ×3 IMPLANT
DRSG AQUACEL AG ADV 3.5X14 (GAUZE/BANDAGES/DRESSINGS) ×3 IMPLANT
DURAPREP 26ML APPLICATOR (WOUND CARE) ×3 IMPLANT
ELECT BLADE TIP CTD 4 INCH (ELECTRODE) ×3 IMPLANT
ELECT REM PT RETURN 15FT ADLT (MISCELLANEOUS) ×3 IMPLANT
FACESHIELD WRAPAROUND (MASK) ×12 IMPLANT
GLOVE BIOGEL PI IND STRL 7.5 (GLOVE) ×5 IMPLANT
GLOVE BIOGEL PI IND STRL 8.5 (GLOVE) ×1 IMPLANT
GLOVE BIOGEL PI INDICATOR 7.5 (GLOVE) ×10
GLOVE BIOGEL PI INDICATOR 8.5 (GLOVE) ×2
GLOVE ECLIPSE 8.0 STRL XLNG CF (GLOVE) ×6 IMPLANT
GLOVE SURG SS PI 7.0 STRL IVOR (GLOVE) ×3 IMPLANT
GLOVE SURG SS PI 7.5 STRL IVOR (GLOVE) ×3 IMPLANT
GOWN STRL REUS W/ TWL XL LVL3 (GOWN DISPOSABLE) ×1 IMPLANT
GOWN STRL REUS W/TWL LRG LVL3 (GOWN DISPOSABLE) ×3 IMPLANT
GOWN STRL REUS W/TWL XL LVL3 (GOWN DISPOSABLE) ×8 IMPLANT
HEAD CERAMIC DELTA 28MM 16/18 (Head) ×3 IMPLANT
LINER NEUT PINNACLE 28X46 (Miscellaneous) ×2 IMPLANT
LINER NEUTRAL PINNACLE 28X46 (Miscellaneous) ×1 IMPLANT
MANIFOLD NEPTUNE II (INSTRUMENTS) ×3 IMPLANT
MARKER SKIN DUAL TIP RULER LAB (MISCELLANEOUS) ×3 IMPLANT
NDL SAFETY ECLIPSE 18X1.5 (NEEDLE) ×1 IMPLANT
NEEDLE HYPO 18GX1.5 SHARP (NEEDLE) ×2
POSITIONER SURGICAL ARM (MISCELLANEOUS) ×3 IMPLANT
SPONGE LAP 18X18 X RAY DECT (DISPOSABLE) ×3 IMPLANT
SPONGE LAP 4X18 X RAY DECT (DISPOSABLE) ×3 IMPLANT
STAPLER VISISTAT 35W (STAPLE) ×3 IMPLANT
SUCTION FRAZIER HANDLE 10FR (MISCELLANEOUS) ×2
SUCTION TUBE FRAZIER 10FR DISP (MISCELLANEOUS) ×1 IMPLANT
SUT MNCRL AB 4-0 PS2 18 (SUTURE) ×3 IMPLANT
SUT VIC AB 1 CT1 36 (SUTURE) ×3 IMPLANT
SUT VIC AB 2-0 CT1 27 (SUTURE) ×6
SUT VIC AB 2-0 CT1 TAPERPNT 27 (SUTURE) ×3 IMPLANT
SUT VLOC 180 0 24IN GS25 (SUTURE) ×6 IMPLANT
SYSTEM HIP MODULAR ARCOS REV+0 (Hips) ×1 IMPLANT
TOWEL OR 17X26 10 PK STRL BLUE (TOWEL DISPOSABLE) ×6 IMPLANT
YANKAUER SUCT BULB TIP 10FT TU (MISCELLANEOUS) ×3 IMPLANT

## 2017-02-14 NOTE — Care Management Note (Signed)
Case Management Note  Patient Details  Name: Samantha Atkins MRN: [id number] Date of Birth: 07/29/1951  Subjective/Objective:  66 y/o f admitted w/Humerus Fx. Surgery today. Await PT cons.  From home. CSW already following.                 Action/Plan:d/c plan SNF.   Expected Discharge Date:                  Expected Discharge Plan:  Skilled Nursing Facility  In-House Referral:  Clinical Social Work  Discharge planning Services  CM Consult  Post Acute Care Choice:    Choice offered to:     DME Arranged:    DME Agency:     HH Arranged:    [place]rian Agency:     Status of Service:  In process, will continue to follow  If discussed at Long Length of Stay Meetings, dates discussed:    Additional Comments:  Dessa Phi, RN 02/14/2017, 2:02 PM

## 2017-02-14 NOTE — Progress Notes (Signed)
PROGRESS NOTE    Kenyata Napier  DJS:[id number] DOB: 1951/04/03 DOA: 02/11/2017 PCP: Patient, No Pcp Per    Brief Narrative:  66 y.o. female with medical history significant of osteoporosis with prior hip fracture, migraine, depression, arthritis who presents after a fall. Patient noted have mechanical fall with subsequent severe left hip pain and was brought to the ED.  She denies any fever, chills, recent illness, nausea, vomiting, constipation, headache, dizziness, LOC, seizure like activity, reflux, leg swelling  Assessment & Plan:   Principal Problem:   Fracture of humerus, proximal, left, closed Active Problems:   Osteoporosis   Migraines   Depression  Fracture of humerus, proximal, left, closed - We'll continue with pain control with morphine, tylenol - Nausea control with Zofran - Orthopedic surgery consulted and following - Pt to have surgery 7/10 - Remains stable    Osteoporosis - Patient continued on estradiol and vitamin D - currently stable    Migraines - We'll continue with Fioricet PRN - at present, stable    Depression - Continue home sertraline - appears to be stable  Normocytic anemia - Labs reviewed. Stable at present - Will continue with iron supplementation daily as tolerated  Urinary retention - Presently stable - UA reviewed. Not suggestive of UTI - Plan to monitor for now  Fall, abnormal EKG - Continue on Telemetry - Currently stable at present  Seizures -See events from 7/9. Patient noted to have new onset witnessed seizures -Neurology consulted. Recs noted -Plan to continue on keppra 500mg  bid -Consider outpt w/u to complete syncope w/u -Have ordered MRI to r/o structural defect -No driving per Neurology recs -Cont PRN ativan -Seizure free thus far  DVT prophylaxis: Heparin subcutaneous Code Status: Full code Family Communication: Patient in room, family not at bedside Disposition Plan: Uncertain at this time  Consultants:    Orthopedic surgery  Procedures:     Antimicrobials: Anti-infectives    Start     Dose/Rate Route Frequency Ordered Stop   02/14/17 1351  ceFAZolin (ANCEF) 2-4 GM/100ML-% IVPB    Comments:  Darlys Gales   : cabinet override      02/14/17 1351 02/14/17 1430   02/14/17 0600  ceFAZolin (ANCEF) IVPB 2g/100 mL premix     2 g 200 mL/hr over 30 Minutes Intravenous On call to O.R. 02/13/17 2051 02/14/17 1435      Subjective: Without complaints this AM  Objective: Vitals:   02/13/17 1624 02/13/17 2224 02/14/17 0447 02/14/17 0909  BP: 127/78 101/67 (!) 96/58   Pulse: (!) 116 97 87   Resp:  20 18   Temp:  98.3 F (36.8 C) 98.4 F (36.9 C)   TempSrc:  Oral Oral   SpO2: 99% 96% 98%   Weight:    39 kg (85 lb 15.7 oz)  Height:    5\' 2"  (1.575 m)    Intake/Output Summary (Last 24 hours) at 02/14/17 1524 Last data filed at 02/14/17 1513  Gross per 24 hour  Intake              210 ml  Output             1025 ml  Net             -815 ml   Filed Weights   02/14/17 0909  Weight: 39 kg (85 lb 15.7 oz)    Examination: General exam: Awake, laying in bed, in nad Respiratory system: Normal respiratory effort, no wheezing Cardiovascular system: regular rate,  s1, s2 Gastrointestinal system: Soft, nondistended, positive BS Central nervous system: CN2-12 grossly intact, strength intact Extremities: Perfused, no clubbing Skin: Normal skin turgor, no notable skin lesions seen Psychiatry: Mood normal // no visual hallucinations    Data Reviewed: I have personally reviewed following labs and imaging studies  CBC:  Recent Labs Lab 02/11/17 2148 02/12/17 0532 02/14/17 0640  WBC 13.7* 11.2* 10.0  NEUTROABS 11.8*  --   --   HGB 11.5* 11.1* 8.1*  HCT 32.9* 32.5* 22.2*  MCV 99.1 100.0 96.5  PLT 202 184 665*   Basic Metabolic Panel:  Recent Labs Lab 02/11/17 2148 02/12/17 0532 02/14/17 0640  NA 142 140 133*  K 3.6 4.1 3.8  CL 109 107 97*  CO2 23 25 23   GLUCOSE 121*  131* 102*  BUN 15 10 14   CREATININE 0.48 0.56 0.49  CALCIUM 8.3* 8.6* 8.5*   GFR: Estimated Creatinine Clearance: 43.2 mL/min (by C-G formula based on SCr of 0.49 mg/dL). Liver Function Tests: No results for input(s): AST, ALT, ALKPHOS, BILITOT, PROT, ALBUMIN in the last 168 hours. No results for input(s): LIPASE, AMYLASE in the last 168 hours. No results for input(s): AMMONIA in the last 168 hours. Coagulation Profile:  Recent Labs Lab 02/11/17 2148 02/12/17 0532  INR 1.04 1.03   Cardiac Enzymes: No results for input(s): CKTOTAL, CKMB, CKMBINDEX, TROPONINI in the last 168 hours. BNP (last 3 results) No results for input(s): PROBNP in the last 8760 hours. HbA1C: No results for input(s): HGBA1C in the last 72 hours. CBG: No results for input(s): GLUCAP in the last 168 hours. Lipid Profile: No results for input(s): CHOL, HDL, LDLCALC, TRIG, CHOLHDL, LDLDIRECT in the last 72 hours. Thyroid Function Tests: No results for input(s): TSH, T4TOTAL, FREET4, T3FREE, THYROIDAB in the last 72 hours. Anemia Panel: No results for input(s): VITAMINB12, FOLATE, FERRITIN, TIBC, IRON, RETICCTPCT in the last 72 hours. Sepsis Labs: No results for input(s): PROCALCITON, LATICACIDVEN in the last 168 hours.  Recent Results (from the past 240 hour(s))  Surgical pcr screen     Status: None   Collection Time: 02/12/17 12:05 PM  Result Value Ref Range Status   MRSA, PCR NEGATIVE NEGATIVE Final   Staphylococcus aureus NEGATIVE NEGATIVE Final    Comment:        The Xpert SA Assay (FDA approved for NASAL specimens in patients over 38 years of age), is one component of a comprehensive surveillance program.  Test performance has been validated by [facility] for patients greater than or equal to 42 year old. It is not intended to diagnose infection nor to guide or monitor treatment.   MRSA PCR Screening     Status: None   Collection Time: 02/13/17 10:15 PM  Result Value Ref Range Status    MRSA by PCR NEGATIVE NEGATIVE Final    Comment:        The GeneXpert MRSA Assay (FDA approved for NASAL specimens only), is one component of a comprehensive MRSA colonization surveillance program. It is not intended to diagnose MRSA infection nor to guide or monitor treatment for MRSA infections.      Radiology Studies: Ct Head Wo Contrast  Result Date: 02/13/2017 CLINICAL DATA:  Recurrent seizure like episodes, improved with Ativan. Status post fall with LEFT hip injury. History of migraines, headache. EXAM: CT HEAD WITHOUT CONTRAST TECHNIQUE: Contiguous axial images were obtained from the base of the skull through the vertex without intravenous contrast. COMPARISON:  CT HEAD July 24, 2016 FINDINGS: BRAIN:  No intraparenchymal hemorrhage, mass effect nor midline shift. The ventricles and sulci are mildly prominent for patient's age. Patchy supratentorial white matter hypodensities. No acute large vascular territory infarcts. No abnormal extra-axial fluid collections. Basal cisterns are patent. VASCULAR: Mild calcific atherosclerosis of the carotid siphons. SKULL: No skull fracture. Severe bilateral temporomandibular osteoarthrosis. No significant scalp soft tissue swelling. SINUSES/ORBITS: The mastoid air-cells and included paranasal sinuses are well-aerated.The included ocular globes and orbital contents are non-suspicious. OTHER: None. IMPRESSION: 1. No acute intracranial process. 2. Stable mild parenchymal brain volume loss for age and mild chronic small vessel ischemic disease. Electronically Signed   By: Elon Alas M.D.   On: 02/13/2017 21:39    Scheduled Meds: . [MAR Hold] cholecalciferol  1,000 Units Oral Daily  . dexamethasone  8 mg Intravenous Once  . [MAR Hold] docusate sodium  100 mg Oral BID  . [MAR Hold] ferrous sulfate  325 mg Oral Q breakfast  . [MAR Hold] heparin  5,000 Units Subcutaneous Q8H  . [MAR Hold] multivitamin with minerals  1 tablet Oral Daily  . [MAR  Hold] potassium chloride SA  20 mEq Oral Daily  . [MAR Hold] sertraline  100 mg Oral Daily   Continuous Infusions: . sodium chloride    . lactated ringers    . [MAR Hold] levETIRAcetam Stopped (02/14/17 0533)     LOS: 2 days   Ervine Witucki, Orpah Melter, MD [facility] Pager [phone]  If 7PM-7AM, please contact night-coverage [email or web address] Password [email or web address] 02/14/2017, 3:24 PM

## 2017-02-14 NOTE — Anesthesia Procedure Notes (Signed)
Procedure Name: Intubation Date/Time: 02/14/2017 2:25 PM Performed by: Montel Clock Pre-anesthesia Checklist: Patient identified, Emergency Drugs available, Suction available, Patient being monitored and Timeout performed Patient Re-evaluated:Patient Re-evaluated prior to inductionOxygen Delivery Method: Circle system utilized Preoxygenation: Pre-oxygenation with 100% oxygen Intubation Type: IV induction Ventilation: Mask ventilation without difficulty Laryngoscope Size: Mac and 3 Grade View: Grade I Tube type: Oral Tube size: 7.0 mm Number of attempts: 1 Airway Equipment and Method: Stylet Placement Confirmation: ETT inserted through vocal cords under direct vision,  positive ETCO2 and breath sounds checked- equal and bilateral Secured at: 21 cm Tube secured with: Tape Dental Injury: Teeth and Oropharynx as per pre-operative assessment

## 2017-02-14 NOTE — Anesthesia Postprocedure Evaluation (Signed)
Anesthesia Post Note  Patient: Samantha Atkins  Procedure(s) Performed: Procedure(s) (LRB): LEFT ORIF PERIPROSTHETIC FEMORAL FRACTURE AND REVISION HIP ARTHROPLASTY (Left)     Patient location during evaluation: PACU Anesthesia Type: General Level of consciousness: awake and alert Pain management: pain level controlled Vital Signs Assessment: post-procedure vital signs reviewed and stable Respiratory status: spontaneous breathing, nonlabored ventilation, respiratory function stable and patient connected to nasal cannula oxygen Cardiovascular status: blood pressure returned to baseline and stable Postop Assessment: no signs of nausea or vomiting Anesthetic complications: no    Last Vitals:  Vitals:   02/14/17 1830 02/14/17 1850  BP: 138/73 136/84  Pulse: 93 94  Resp: 14 14  Temp: 36.6 C 36.6 C    Last Pain:  Vitals:   02/14/17 1830  TempSrc:   PainSc: [name]

## 2017-02-14 NOTE — Anesthesia Preprocedure Evaluation (Addendum)
Anesthesia Evaluation  Patient identified by MRN, date of birth, ID band Patient awake    Reviewed: Allergy & Precautions, NPO status , Patient's Chart, lab work & pertinent test results  Airway Mallampati: II  TM Distance: >3 FB Neck ROM: Full    Dental  (+) Dental Advisory Given   Pulmonary neg pulmonary ROS,    breath sounds clear to auscultation       Cardiovascular negative cardio ROS   Rhythm:Regular Rate:Normal     Neuro/Psych  Headaches, Seizures -, Poorly Controlled,  Anxiety Depression    GI/Hepatic Neg liver ROS, GERD  ,  Endo/Other  negative endocrine ROS  Renal/GU negative Renal ROS     Musculoskeletal  (+) Arthritis ,   Abdominal   Peds  Hematology  (+) anemia ,   Anesthesia Other Findings   Reproductive/Obstetrics                            Lab Results  Component Value Date   WBC 10.0 02/14/2017   HGB 8.1 (L) 02/14/2017   HCT 22.2 (L) 02/14/2017   MCV 96.5 02/14/2017   PLT 136 (L) 02/14/2017   Lab Results  Component Value Date   CREATININE 0.49 02/14/2017   BUN 14 02/14/2017   NA 133 (L) 02/14/2017   K 3.8 02/14/2017   CL 97 (L) 02/14/2017   CO2 23 02/14/2017    Anesthesia Physical Anesthesia Plan  ASA: III  Anesthesia Plan: General   Post-op Pain Management:    Induction: Intravenous  PONV Risk Score and Plan: 4 or greater and Ondansetron, Dexamethasone, Propofol, Midazolam and Treatment may vary due to age or medical condition  Airway Management Planned: Oral ETT  Additional Equipment:   Intra-op Plan:   Post-operative Plan: Extubation in OR  Informed Consent: I have reviewed the patients History and Physical, chart, labs and discussed the procedure including the risks, benefits and alternatives for the proposed anesthesia with the patient or authorized representative who has indicated his/her understanding and acceptance.   Dental advisory  given  Plan Discussed with: CRNA  Anesthesia Plan Comments:        Anesthesia Quick Evaluation

## 2017-02-14 NOTE — Progress Notes (Signed)
Patient ID: Samantha Atkins, female   DOB: 1950/10/25, 66 y.o.   MRN: [id number]  Ready for OR today Reported seizure activity yesterday, plan per medicine  Hgb - 8+  NPO Plan to go to OR this pm for revision and ORIF left peri-prosthetic proximal femur fracture Type and screen - will likely need blood  Acute blood loss anemia from injury and procedure Post-op plans based on intra-operative findings Consent ordered yesterday

## 2017-02-14 NOTE — Transfer of Care (Signed)
Immediate Anesthesia Transfer of Care Note  Patient: Samantha Atkins  Procedure(s) Performed: Procedure(s): LEFT ORIF PERIPROSTHETIC FEMORAL FRACTURE AND REVISION HIP ARTHROPLASTY (Left)  Patient Location: PACU  Anesthesia Type:General  Level of Consciousness:  sedated, patient cooperative and responds to stimulation  Airway & Oxygen Therapy:Patient Spontanous Breathing and Patient connected to face mask oxgen  Post-op Assessment:  Report given to PACU RN and Post -op Vital signs reviewed and stable  Post vital signs:  Reviewed and stable  Last Vitals:  Vitals:   02/13/17 2224 02/14/17 0447  BP: 101/67 (!) 96/58  Pulse: 97 87  Resp: 20 18  Temp: 36.8 C 35.5 C    Complications: No apparent anesthesia complications

## 2017-02-15 ENCOUNTER — Encounter (HOSPITAL_COMMUNITY): Payer: Self-pay | Admitting: Orthopedic Surgery

## 2017-02-15 ENCOUNTER — Inpatient Hospital Stay (HOSPITAL_COMMUNITY): Payer: Commercial Managed Care - PPO

## 2017-02-15 DIAGNOSIS — E43 Unspecified severe protein-calorie malnutrition: Secondary | ICD-10-CM | POA: Insufficient documentation

## 2017-02-15 DIAGNOSIS — S42202A Unspecified fracture of upper end of left humerus, initial encounter for closed fracture: Secondary | ICD-10-CM

## 2017-02-15 DIAGNOSIS — D62 Acute posthemorrhagic anemia: Secondary | ICD-10-CM

## 2017-02-15 LAB — BASIC METABOLIC PANEL
Anion gap: 12 (ref 5–15)
BUN: 9 mg/dL (ref 6–20)
CALCIUM: 8.4 mg/dL — AB (ref 8.9–10.3)
CHLORIDE: 101 mmol/L (ref 101–111)
CO2: 25 mmol/L (ref 22–32)
CREATININE: 0.46 mg/dL (ref 0.44–1.00)
GFR calc non Af Amer: >60 mL/min (ref >60)
GLUCOSE: 95 mg/dL (ref 65–99)
Potassium: 4.8 mmol/L (ref 3.5–5.1)
Sodium: 138 mmol/L (ref 135–145)

## 2017-02-15 LAB — TYPE AND SCREEN
ABO/RH(D): A POS
Antibody Screen: NEGATIVE
UNIT DIVISION: 0
UNIT DIVISION: 0
Unit division: 0
Unit division: 0

## 2017-02-15 LAB — BPAM RBC
BLOOD PRODUCT EXPIRATION DATE: 201807172359
BLOOD PRODUCT EXPIRATION DATE: 201807192359
Blood Product Expiration Date: 201807232359
Blood Product Expiration Date: 201807232359
ISSUE DATE / TIME: 201807101348
ISSUE DATE / TIME: 201807101348
UNIT TYPE AND RH: 6200
UNIT TYPE AND RH: 6200
UNIT TYPE AND RH: 6200
Unit Type and Rh: 6200

## 2017-02-15 LAB — CBC
HEMATOCRIT: 19.8 % — AB (ref 36.0–46.0)
Hemoglobin: 7.2 g/dL — ABNORMAL LOW (ref 12.0–15.0)
MCH: 32.6 pg (ref 26.0–34.0)
MCHC: 36.4 g/dL — ABNORMAL HIGH (ref 30.0–36.0)
MCV: 89.6 fL (ref 78.0–100.0)
Platelets: 106 10*3/uL — ABNORMAL LOW (ref 150–400)
RBC: 2.21 MIL/uL — ABNORMAL LOW (ref 3.87–5.11)
RDW: 15.6 % — AB (ref 11.5–15.5)
WBC: 10.2 10*3/uL (ref 4.0–10.5)

## 2017-02-15 LAB — MAGNESIUM: Magnesium: 1.6 mg/dL — ABNORMAL LOW (ref 1.7–2.4)

## 2017-02-15 MED ORDER — ENSURE ENLIVE PO LIQD
237.0000 mL | Freq: Two times a day (BID) | ORAL | Status: DC
  Administered 2017-02-15 – 2017-02-17 (×5): 237 mL via ORAL

## 2017-02-15 MED ORDER — LEVETIRACETAM 500 MG PO TABS
500.0000 mg | ORAL_TABLET | Freq: Two times a day (BID) | ORAL | Status: DC
  Administered 2017-02-15 – 2017-02-17 (×4): 500 mg via ORAL
  Filled 2017-02-15 (×4): qty 1

## 2017-02-15 MED ORDER — MAGNESIUM OXIDE 400 (241.3 MG) MG PO TABS
400.0000 mg | ORAL_TABLET | Freq: Every day | ORAL | Status: AC
  Administered 2017-02-15 – 2017-02-16 (×2): 400 mg via ORAL
  Filled 2017-02-15 (×2): qty 1

## 2017-02-15 NOTE — Progress Notes (Signed)
Initial Nutrition Assessment  DOCUMENTATION CODES:   Severe malnutrition in context of chronic illness, Underweight  INTERVENTION:  - Text paged MD concerning pt's request for advancement from CLD to Regular diet. - Will order Ensure Enlive BID, each supplement provides 350 kcal and 20 grams of protein. - Will order Magic Cup with lunch meals, this supplement provides 290 kcal and 9 grams of protein - Continue to encourage PO intakes of meals and supplements. - RD will continue to monitor for additional nutrition-related needs.  NUTRITION DIAGNOSIS:   Malnutrition (severe) related to chronic illness (osteoporosis and arthritis) as evidenced by severe depletion of muscle mass, severe depletion of body fat.  GOAL:   Patient will meet greater than or equal to 90% of their needs  MONITOR:   Diet advancement, PO intake, Supplement acceptance, Weight trends, Labs, Skin, I & O's  REASON FOR ASSESSMENT:   Consult Assessment of nutrition requirement/status  ASSESSMENT:   66 y.o. female with medical history significant of osteoporosis with hip fracture in the past, migraine, depression, arthritis who presents after a fall.  Her husband fills in part of the story.  Apparently today, they were together in the bedroom when Ms. Liebler seemed to stumble on her feet (though was standing still) and fell on her left side and bumped her head on a rocking chair.  Pt seen for consult. BMI indicates underweight status. Pt consumed 50% of lunch on Regular diet on 7/9 and no other intakes documented since admission. She had L humerus surgical fixation yesterday and diet subsequently advanced to CLD. Visualized breakfast tray with ~50% completion. Pt denies abdominal pain or N/V with intakes and is requesting advancement to Regular diet at this time. She states that in May she fell and broke her R hip and that since that time she has had chronic, severe pain and intakes have been decreased. She is concerned  and wants to start focusing on her diet and adequate intakes. Will order supplements to assist.  Physical assessment limited to upper body only per pt request and shows severe fat wasting and moderate and severe muscle wasting. Per chart review, weight has been stable x1 year. Pt reports she has lost "alot of weight" since May but is unsure of UBW or how much weight she may have lost.   Medications reviewed; 1000 units vitamin D/day, 100 mg Colace BID, 325 mg ferrous sulfate/day, daily multivitamin with minerals, 20 mEq oral KCl/day.  Labs reviewed; Ca: 8.4 mg/dL, Mg: 1.6 mg/dL.    Diet Order:  Diet clear liquid Room service appropriate? Yes; Fluid consistency: Thin  Skin:  Wound (see comment) (L hip incision from 7/10)  Last BM:  7/6 (PTA)  Height:   Ht Readings from Last 1 Encounters:  02/14/17 5\' 2"  (1.575 m)    Weight:   Wt Readings from Last 1 Encounters:  02/14/17 85 lb 15.7 oz (39 kg)    Ideal Body Weight:  50 kg  BMI:  Body mass index is 15.73 kg/m.  Estimated Nutritional Needs:   Kcal:  1365-1560 (35-40 kcal/kg)  Protein:  47-58 grams (1.2-1.5g rams/kg)  Fluid:  >/= 1.4 L/day  EDUCATION NEEDS:   No education needs identified at this time    Jarome Matin, MS, RD, LDN, CNSC Inpatient Clinical Dietitian Pager # [phone] After hours/weekend pager # [phone]

## 2017-02-15 NOTE — Op Note (Signed)
NAMEMarland Kitchen  Samantha Atkins, Samantha Atkins NO.:  [id number]  MEDICAL RECORD NO.:  [id number]  LOCATION:  1509                         FACILITY:  [facility]  PHYSICIAN:  Pietro Cassis. Alvan Dame, M.D.  DATE OF BIRTH:  [birth year]  DATE OF PROCEDURE:  02/14/2017 DATE OF DISCHARGE:                              OPERATIVE REPORT   PREOPERATIVE DIAGNOSIS:  Very complex comminuted left periprosthetic proximal femur fracture from total hip arthroplasty performed in 2005.  POSTOPERATIVE DIAGNOSIS:  Very complex comminuted left periprosthetic proximal femur fracture from total hip arthroplasty performed in 2005.  PROCEDURE:  Left hip open reduction and internal fixation of periprosthetic femur fracture with revision of her left hip.  COMPONENTS USED:  A Biomet hip system with a size 16 mm x 210 mm standard offset calcar body stem, a 4-hole greater trochanteric reattachment device, a single other cable.  SURGEON:  Pietro Cassis. Alvan Dame, M.D.  ASSISTANT:  Danae Orleans, PA, noted Mr. Samantha Atkins was present for the entirety of the case from preoperative position, perioperative management of the operative extremity, general facilitation of the case, and primary wound closure.  ANESTHESIA:  General.  BLOOD LOSS:  About 1200 mL.  BLOOD ADMINISTERED:  She started with a hemoglobin around 8+ and received 2 units of packed red blood cells during the procedure.  A perioperative hemoglobin revealed a hemoglobin of 9, thus remaining hemodynamically stable.  SPECIMENS:  None.  COMPLICATIONS:  Only complications were based on the significant comminution and quality of her overall bone quality affecting fixation and management.  INTRAOPERATIVE FINDINGS:  We did use intraoperative imagery to confirm orientation of the component and position.  INDICATIONS FOR PROCEDURE:  Samantha Atkins is a 66 year old female with a history of bilateral total hip replacements.  Her right hip has been complicated by periprosthetic  fracture, requiring open reduction and internal fixation.  She had her left total hip replacement done in 2005, in [place].  She presented to [facility] after moving to this area recently from [place] with her husband.  She had a fall, landing on her left hip, had an obvious deformity, inability to bear weight, and was brought to the emergency room.  Radiographs revealed a very complex proximal femur fracture [place] B involving the entire proximal aspect of the hip to the distal portion of previously placed Tri-Lock stem.  She was admitted to the hospital and once arrangements were made for appropriate treatment and care, surgery was scheduled.  Risks, benefits, and necessity of the procedure were discussed.  I stressed the complexity of the problem to her and her husband in terms of long-term management and expectations.  Risks of infection, DVT, component failure, dislocation, need for future surgery all discussed and reviewed.  Consent was obtained for management.  PROCEDURE IN DETAIL:  The patient was brought to the operative theater. Once adequate anesthesia and preoperative antibiotics administered, she was positioned into the right lateral decubitus position with left hip up.  The left lower extremity was then prepped and draped in sterile fashion.  She was noted to have a more longitudinal-based lateral incision.  I did change this to be a more posterior approach  for exposure of the hip.  Her landmarks were not identifiable due to the comminution of the proximal aspect of the femur.  Following a time-out, identifying the patient, planned procedure, and extremity, incision was made over the lateral side of her hip extending posteriorly.  Soft tissue dissection was carried down to the iliotibial band and gluteal fascia.  Again, landmarks were difficult to identify.  The iliotibial band and gluteal fascia were then incised.  We encountered evidence of contusion in  the soft tissues and then seroma and hematoma once we got down to the fracture site.  I began this procedure by identifying the acetabulum for landmark purposes as the proximal femur was comminuted.  Once I identified the acetabulum, I was able to work back.  Following exposure of the posterior aspect of the hip, evacuating the hematoma and irrigation, I was able to remove the femoral head.  We identified that the femur was basically attached to anterior proximal femur bone stock minimally.  I was able to remove this from the prosthesis using an osteotome.  The femoral component was removed.  At this point, I was planning to and did remove the old acetabular liner and replaced it with a new Pinnacle 46 mm x 28 mm neutral polyethylene.  Once this was done, now I then focused on the femur itself.  It took a very long time to identify what bone was present and usable. Landmarks for leg lengths and so forth were basically nonexistent. Identified the isthmus of her femur is the only area of bone present. For this reason, we began using reamers and reamed up to 15.5 mm with this Biomet system for 60 mm stem to provide fixation.  I was able to do a trial reduction with a trial stem in place.  I reduced the hip to identify some landmarks for leg lengths comparing that to her down leg.  I was then able to reapproximate some of the bone fragments using initially wire fixation.  The proximal aspect of the femur near the greater trochanteric segment was noted to be severely comminuted and osteoporotic with very limited bone for utilization.  At this point, I did do an intraoperative imagery using a portable x-ray. I identified that the stem appeared to be in appropriate position with reapproximation of some of the bone fragments.  Once this trial reduction was carried out, I selected a 16 mm x 210 mm Arcos 1P stem.  I used a calcar replacing to allow for cable fixation of a plate.  This  was then impacted to the level that I had measured based on her leg lengths and identifiable remaining bony landmarks.  Once I had it to appropriate depth, I did a followup trial, identifying whatever bone stock and soft tissues.  I did have proximally to try to provide some sleeves for gluteal attachment.  I had elected at this point to use a cable claw plate to provide fixation both of the proximal aspect of the femur as well as this remaining very weak bone stock and gluteal sleeve.  Once I had done all this trial reduction, I did select a final +3 sleeve with the 28 mm Biolox head.  This was then impacted onto a clean and dried trunnion and the hip was reduced.  At this point, based on the trial reductions and the relative bone that was available, I did pull the greater trochanteric sleeve over the top of the prosthesis and using the cable claw plate, attached  this area to the proximal aspect of the prosthesis with cables through the prosthesis at the calcar neck as well as around the isthmus.  They are once on the proximal aspect of the femur, we were able to incorporate what I had previously provisionally fixed with a wire.  All these wires were tensioned to 75 units of pressure, crimped and cut.  The lesser trochanteric segment was noted to be very comminuted, but I did try to reapproximate this using the cable to try to hold this in some relative proximity for hopeful function.  The hip had been irrigated throughout the case and again at the end of the case.  Once this entire procedure was performed, we reapproximated the iliotibial band and gluteal fascia using #1 Vicryl and 0 V-Loc suture. The remainder of the wound was closed with 2-0 Vicryl and running 3-0 Monocryl.  The hip was cleaned, dried, and dressed sterilely using surgical glue and Aquacel dressing.  She was then awoken from anesthesia and brought to the recovery room in stable condition, tolerating  the procedure well.  Postoperatively, based on her bone stock, she will be limited to touchdown to partial weightbearing for probably 6 to 12 weeks depending on bone healing and her response to the recovery.  Findings were reviewed with her husband.     Pietro Cassis Alvan Dame, M.D.     MDO/MEDQ  D:  02/15/2017  T:  02/15/2017  Job:  [id number]

## 2017-02-15 NOTE — Brief Op Note (Signed)
02/11/2017 - 02/14/2017  7:38 AM  PATIENT:  Samantha Atkins  66 y.o. female  PRE-OPERATIVE DIAGNOSIS:   LEFT periprosthetic proximal femoral fracture  POST-OPERATIVE DIAGNOSIS:  LEFT periprosthetic proximal femoral fracture  PROCEDURE:  Procedure(s): LEFT ORIF PERIPROSTHETIC FEMORAL FRACTURE AND REVISION HIP ARTHROPLASTY (Left)  SURGEON:  Surgeon(s) and Role:    Paralee Cancel, MD - Primary  PHYSICIAN ASSISTANT: Danae Orleans, PA-C  ANESTHESIA:   general  EBL:  1200cc  BLOOD ADMINISTERED:600 CC PRBC, (2 units PRBCs)  DRAINS: none   LOCAL MEDICATIONS USED:  NONE  SPECIMEN:  No Specimen  DISPOSITION OF SPECIMEN:  N/A  COUNTS:  YES  TOURNIQUET:  * No tourniquets in log *  DICTATION: .Other Dictation: Dictation Number [id number]  PLAN OF CARE: Admit to inpatient   PATIENT DISPOSITION:  PACU - hemodynamically stable.   Delay start of Pharmacological VTE agent (>24hrs) due to surgical blood loss or risk of bleeding: no

## 2017-02-15 NOTE — Progress Notes (Signed)
CSW met with patient at bedside, explain role and reason for visit-assist with rehab placement. Patient reports during past surgeries she has been able to rehab at home. At this time patient feels she will be able to rehab at home with [facility] services. CSW provided card if patient decides later rehab at facility.   Kathrin Greathouse, Latanya Presser, MSW Clinical Social Worker 5E and Psychiatric Service Line [phone] 02/15/2017  8:14 AM

## 2017-02-15 NOTE — Progress Notes (Signed)
PROGRESS NOTE    Samantha Atkins  JJO:[id number] DOB: 1950-11-22 DOA: 02/11/2017 PCP: Patient, No Pcp Per    Brief Narrative:  66 y.o. female with medical history significant of osteoporosis with prior hip fracture, migraine, depression, arthritis who presents after a fall. Patient noted have mechanical fall with subsequent severe left hip pain and was brought to the ED.  She denies any fever, chills, recent illness, nausea, vomiting, constipation, headache, dizziness, LOC, seizure like activity, reflux, leg swelling  Assessment & Plan:   Principal Problem:   Fracture of humerus, proximal, left, closed Active Problems:   Osteoporosis   Migraines   Depression   Protein-calorie malnutrition, severe  Fracture of humerus, proximal, left, closed -s/p "Left hip open reduction and internal fixation of periprosthetic femur fracture with revision of her left hip."on 7/10 -per ortho" Postoperatively, based on her bone stock, she will be limited to touchdown to partial weightbearing for probably 6 to 12 weeks depending on bone healing and her response to the recovery." - management per ortho  Post op anemia:  -Estimated blood loss 1200 during surgery -S/p prbc transfusion x2units -repeat cbc in am, transfuse to keep hgb>7.  Seizures -See events from 7/9. Patient noted to have new onset witnessed seizures , patient has husband denies prior diagnosis of  seizure , patient has h/p multiple falls, wonder seizures is a cause. -per 7/9 note: "seizure-like episode with patient staring spell and gritting teeth, tachycardia. Given 2mg  ativan with gradual improvement. Patient now answering questions slowly and appears post-ictal." -mri brain "Premature atrophy.  Mild small vessel disease. No acute or focal intracranial abnormality is seen." -Neurology consulted. Recs noted, -Plan to continue on keppra 500mg  bid, No driving per Neurology recs (husband report patient does not drive at baseline) -Consider outpt  w/u to complete syncope w/u --Cont PRN ativan -Seizure free thus far  Urinary retention -"since being in the ED, she has had difficulty with feeling the need to urinate, but not being able to. She has no abdominal pain or swelling." - - UA reviewed. Not suggestive of UTI, cr wnl - opioids analgesics related? She also has constipation, start stool softener, voiding trial when patient is able get up more,      Hypomagnesemia: replace mag     Migraines - We'll continue with Fioricet PRN - at present, stable    Depression - Continue home sertraline - appears to be stable  Osteoporosis - Patient continued on estradiol and vitamin D - currently stable  H/o multiple falls, multiple factures including bilateral rib fractures, cervical spine fracture (followed by Dr Isac Caddy per husband)  Severe malnutrition in context of chronic illness, Underweight Body mass index is 15.73 kg/m.  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, mining, she might have body image issues, she does not want to eat Nutrition consulted,  input appreciated  DVT prophylaxis: Heparin subcutaneous Code Status: Full code Family Communication: husband at bedside Disposition Plan: likely will need snf  Consultants:   Orthopedic surgery  Procedures:  Left hip open reduction and internal fixation of periprosthetic femur fracture with revision of her left hip."on 7/10 prbc transfusion x2 on 7/10  Antimicrobials: Anti-infectives    Start     Dose/Rate Route Frequency Ordered Stop   02/14/17 2100  ceFAZolin (ANCEF) IVPB 2g/100 mL premix     2 g 200 mL/hr over 30 Minutes Intravenous Every 6 hours 02/14/17 1909 02/15/17 0430   02/14/17 1351  ceFAZolin (ANCEF) 2-4 GM/100ML-% IVPB  Comments:  Sherrica, Niehaus   : cabinet override      02/14/17 1351 02/14/17 1430   02/14/17 0600  ceFAZolin (ANCEF) IVPB 2g/100 mL premix     2 g 200 mL/hr over 30 Minutes Intravenous On call to O.R. 02/13/17 2051 02/14/17 1435       Subjective: C/o left pain with activity, no bm, foley in place, no fever, denies abdominal pain.  Objective: Vitals:   02/14/17 1943 02/14/17 2011 02/15/17 0418 02/15/17 1428  BP: 128/70 124/71 (!) 106/58 126/66  Pulse: 93 96 87 (!) 118  Resp: 14 15 15 18   Temp: 98 F (36.7 C) 98.1 F (36.7 C) 98.9 F (37.2 C) 99.1 F (37.3 C)  TempSrc: Oral Oral Oral Oral  SpO2: 100% 100% 100% 97%  Weight:      Height:        Intake/Output Summary (Last 24 hours) at 02/15/17 1733 Last data filed at 02/15/17 1533  Gross per 24 hour  Intake             1710 ml  Output             1600 ml  Net              110 ml   Filed Weights   02/14/17 0909  Weight: 39 kg (85 lb 15.7 oz)    Examination: General exam: very frail and thin, appear weak, but NAD,  Respiratory system: Normal respiratory effort, no wheezing Cardiovascular system: regular rate, s1, s2 Gastrointestinal system: Soft, nondistended, positive BS Central nervous system: CN2-12 grossly intact, strength intact Extremities: Perfused, no clubbing, left hip post op changes Skin: Normal skin turgor, no notable skin lesions seen Psychiatry: Mood normal // no visual hallucinations    Data Reviewed: I have personally reviewed following labs and imaging studies  CBC:  Recent Labs Lab 02/11/17 2148 02/12/17 0532 02/14/17 0640 02/14/17 1613 02/15/17 0757  WBC 13.7* 11.2* 10.0  --  10.2  NEUTROABS 11.8*  --   --   --   --   HGB 11.5* 11.1* 8.1* 9.9* 7.2*  HCT 32.9* 32.5* 22.2* 29.0* 19.8*  MCV 99.1 100.0 96.5  --  89.6  PLT 202 184 136*  --  032*   Basic Metabolic Panel:  Recent Labs Lab 02/11/17 2148 02/12/17 0532 02/14/17 0640 02/14/17 1613 02/15/17 0757 02/15/17 0822  NA 142 140 133* 132* 138  --   K 3.6 4.1 3.8 4.9 4.8  --   CL 109 107 97*  --  101  --   CO2 23 25 23   --  25  --   GLUCOSE 121* 131* 102*  --  95  --   BUN 15 10 14   --  9  --   CREATININE 0.48 0.56 0.49  --  0.46  --   CALCIUM 8.3* 8.6*  8.5*  --  8.4*  --   MG  --   --   --   --   --  1.6*   GFR: Estimated Creatinine Clearance: 43.2 mL/min (by C-G formula based on SCr of 0.46 mg/dL). Liver Function Tests: No results for input(s): AST, ALT, ALKPHOS, BILITOT, PROT, ALBUMIN in the last 168 hours. No results for input(s): LIPASE, AMYLASE in the last 168 hours. No results for input(s): AMMONIA in the last 168 hours. Coagulation Profile:  Recent Labs Lab 02/11/17 2148 02/12/17 0532  INR 1.04 1.03   Cardiac Enzymes: No results for input(s): CKTOTAL, CKMB, CKMBINDEX, TROPONINI  in the last 168 hours. BNP (last 3 results) No results for input(s): PROBNP in the last 8760 hours. HbA1C: No results for input(s): HGBA1C in the last 72 hours. CBG: No results for input(s): GLUCAP in the last 168 hours. Lipid Profile: No results for input(s): CHOL, HDL, LDLCALC, TRIG, CHOLHDL, LDLDIRECT in the last 72 hours. Thyroid Function Tests: No results for input(s): TSH, T4TOTAL, FREET4, T3FREE, THYROIDAB in the last 72 hours. Anemia Panel: No results for input(s): VITAMINB12, FOLATE, FERRITIN, TIBC, IRON, RETICCTPCT in the last 72 hours. Sepsis Labs: No results for input(s): PROCALCITON, LATICACIDVEN in the last 168 hours.  Recent Results (from the past 240 hour(s))  Surgical pcr screen     Status: None   Collection Time: 02/12/17 12:05 PM  Result Value Ref Range Status   MRSA, PCR NEGATIVE NEGATIVE Final   Staphylococcus aureus NEGATIVE NEGATIVE Final    Comment:        The Xpert SA Assay (FDA approved for NASAL specimens in patients over 17 years of age), is one component of a comprehensive surveillance program.  Test performance has been validated by [facility] for patients greater than or equal to 53 year old. It is not intended to diagnose infection nor to guide or monitor treatment.   MRSA PCR Screening     Status: None   Collection Time: 02/13/17 10:15 PM  Result Value Ref Range Status   MRSA by PCR NEGATIVE  NEGATIVE Final    Comment:        The GeneXpert MRSA Assay (FDA approved for NASAL specimens only), is one component of a comprehensive MRSA colonization surveillance program. It is not intended to diagnose MRSA infection nor to guide or monitor treatment for MRSA infections.      Radiology Studies: Ct Head Wo Contrast  Result Date: 02/13/2017 CLINICAL DATA:  Recurrent seizure like episodes, improved with Ativan. Status post fall with LEFT hip injury. History of migraines, headache. EXAM: CT HEAD WITHOUT CONTRAST TECHNIQUE: Contiguous axial images were obtained from the base of the skull through the vertex without intravenous contrast. COMPARISON:  CT HEAD July 24, 2016 FINDINGS: BRAIN: No intraparenchymal hemorrhage, mass effect nor midline shift. The ventricles and sulci are mildly prominent for patient's age. Patchy supratentorial white matter hypodensities. No acute large vascular territory infarcts. No abnormal extra-axial fluid collections. Basal cisterns are patent. VASCULAR: Mild calcific atherosclerosis of the carotid siphons. SKULL: No skull fracture. Severe bilateral temporomandibular osteoarthrosis. No significant scalp soft tissue swelling. SINUSES/ORBITS: The mastoid air-cells and included paranasal sinuses are well-aerated.The included ocular globes and orbital contents are non-suspicious. OTHER: None. IMPRESSION: 1. No acute intracranial process. 2. Stable mild parenchymal brain volume loss for age and mild chronic small vessel ischemic disease. Electronically Signed   By: Elon Alas M.D.   On: 02/13/2017 21:39   Mr Brain Wo Contrast  Result Date: 02/15/2017 CLINICAL DATA:  Staring episode with confusion. Possible seizure. Serious psychiatric history. EXAM: MRI HEAD WITHOUT CONTRAST TECHNIQUE: Multiplanar, multiecho pulse sequences of the brain and surrounding structures were obtained without intravenous contrast. COMPARISON:  CT head 02/13/2017. FINDINGS: Brain: No  acute infarction, hemorrhage, hydrocephalus, extra-axial collection or mass lesion. Premature for age cerebral and cerebellar atrophy. Mild subcortical and periventricular T2 and FLAIR hyperintensities, likely chronic microvascular ischemic change. Thin-section imaging through the temporal lobes demonstrates no asymmetry or mass. Vascular: Flow voids are maintained throughout the carotid, basilar, and vertebral arteries. There are no areas of chronic hemorrhage. Skull and upper cervical spine: Normal marrow  signal. Significant anterolisthesis C2 on C3 of 5 mm represents a chronic hangman's fracture. Sinuses/Orbits: Negative. Other: None. IMPRESSION: Premature atrophy.  Mild small vessel disease. No acute or focal intracranial abnormality is seen. 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 The stability of this fracture is unclear. Lateral flexion extension radiographs could assess for dynamic instability. Noncontrast CT of the cervical spine could assess for interval healing. Electronically Signed   By: Staci Righter M.D.   On: 02/15/2017 09:54   Dg Hip Port Unilat With Pelvis 1v Left  Result Date: 02/14/2017 CLINICAL DATA:  Postoperative evaluation. EXAM: DG HIP (WITH OR WITHOUT PELVIS) 1V PORT LEFT COMPARISON:  Pelvic radiographs February 11, 2017 FINDINGS: Interval LEFT total hip arthroplasty revision, elongated femoral stem transfixing nondisplaced LEFT femur fracture. New lateral plate and cerclage wires. Femoral prostheses are located, status post RIGHT hip total arthroplasty with lateral plate and cerclage wire fixation. Osteopenia. LEFT hip subcutaneous gas consistent with recent surgery. IMPRESSION: Status post LEFT total hip arthroplasty revision transfixing nondisplaced proximal LEFT femur fracture on single AP view. Old RIGHT THA. Electronically Signed   By: Elon Alas M.D.   On: 02/14/2017 17:37   Dg Femur Port Min 2 Views Left  Result Date: 02/14/2017 CLINICAL DATA:   Periprosthetic femoral fracture. EXAM: LEFT FEMUR PORTABLE 2 VIEWS COMPARISON:  02/11/2017. FINDINGS: There is been revision of the LEFT THA to extend further into the femoral shaft. IMPRESSION: Satisfactory position and alignment THA revision. Electronically Signed   By: Staci Righter M.D.   On: 02/14/2017 16:50    Scheduled Meds: . cholecalciferol  1,000 Units Oral Daily  . docusate sodium  100 mg Oral BID  . feeding supplement (ENSURE ENLIVE)  237 mL Oral BID BM  . ferrous sulfate  325 mg Oral Q breakfast  . heparin  5,000 Units Subcutaneous Q8H  . levETIRAcetam  500 mg Oral BID  . magnesium oxide  400 mg Oral Daily  . multivitamin with minerals  1 tablet Oral Daily  . potassium chloride SA  20 mEq Oral Daily  . sertraline  100 mg Oral Daily   Continuous Infusions: . sodium chloride    . methocarbamol (ROBAXIN)  IV       LOS: 3 days    Time spent> 64mins  Shanay Woolman, MD PhD [facility] Pager [phone]  If 7PM-7AM, please contact night-coverage [email or web address] Password [facility] 02/15/2017, 5:33 PM

## 2017-02-15 NOTE — Care Management Note (Signed)
Case Management Note  Patient Details  Name: Samantha Atkins MRN: [id number] Date of Birth: 1950/12/15  Subjective/Objective: Patient wants home w/HHC.Provided patient w/HHC agency list-[facility] not in network;will check w/other [facility] agencies.   Has home support, rw, 3n1.                Action/Plan:d/c plan home w/HHC.   Expected Discharge Date:                  Expected Discharge Plan:  [place]  In-House Referral:  Clinical Social Work  Discharge planning Services  CM Consult  Post Acute Care Choice:    Choice offered to:  Patient  DME Arranged:    DME Agency:     HH Arranged:    [place] Agency:     Status of Service:  In process, will continue to follow  If discussed at Long Length of Stay Meetings, dates discussed:    Additional Comments:  Dessa Phi, RN 02/15/2017, 4:56 PM

## 2017-02-15 NOTE — Evaluation (Signed)
Physical Therapy Evaluation Patient Details Name: Samantha Atkins MRN: [id number] DOB: 09-08-1950 Today's Date: 02/15/2017   History of Present Illness  Pt s/p fall with seizure.  Pt s/p LEFT ORIF PERIPROSTHETIC FEMORAL FRACTURE AND REVISION HIP ARTHROPLASTY (Left). Pt is PWB  Clinical Impression  Pt admitted with above diagnosis. Pt currently with functional limitations due to the deficits listed below (see PT Problem List). * Pt will benefit from skilled PT to increase their independence and safety with mobility to allow discharge to the venue listed below.   Pt will benefit from continued PT in acute setting; pt limited today by pain and mild dizziness, Hgb 7.2; pt very willing to get to OOB to chair; pt would like to go home if at all possible;     Follow Up Recommendations Home health PT;SNF (depending on progress)    Equipment Recommendations  Wheelchair (measurements PT)    Recommendations for Other Services       Precautions / Restrictions Precautions Precautions: Fall Restrictions Weight Bearing Restrictions: Yes LLE Weight Bearing: Partial weight bearing LLE Partial Weight Bearing Percentage or Pounds: 50%      Mobility  Bed Mobility Overal bed mobility: Needs Assistance Bed Mobility: Supine to Sit     Supine to sit: Mod assist;+2 for physical assistance;Min assist     General bed mobility comments: assist with trunk and LEs, extra time needed d/t pain   Transfers Overall transfer level: Needs assistance Equipment used: Rolling walker (2 wheeled) Transfers: Sit to/from [employer] Sit to Stand: Max assist;+2 physical assistance;+2 safety/equipment Stand pivot transfers: Max assist;+2 physical assistance;+2 safety/equipment;Mod assist       General transfer comment: assist to rise and stabilize, extra time needed, cues for PWB/sequence  Ambulation/Gait             General Gait Details: NT/unable today  Stairs             Wheelchair Mobility    Modified Rankin (Stroke Patients Only)       Balance Overall balance assessment: Needs assistance Sitting-balance support: Bilateral upper extremity supported;Feet supported Sitting balance-Leahy Scale: Fair       Standing balance-Leahy Scale: Poor Standing balance comment: reliant on UE support and external assist                              Pertinent Vitals/Pain Pain Assessment: Faces Pain Score: 8  Faces Pain Scale: Hurts whole lot Pain Location: L hip Pain Descriptors / Indicators: Sore;Guarding;Aching Pain Intervention(s): Limited activity within patient's tolerance;Monitored during session;Premedicated before session;Repositioned    Home Living Family/patient expects to be discharged to:: Private residence Living Arrangements: Spouse/significant other Available Help at Discharge: Family Type of Home: House Home Access: Stairs to enter   Technical brewer of Steps: 3 Home Layout: One level Home Equipment: Grab bars - tub/shower;Walker - 2 wheels      Prior Function Level of Independence: Independent               Hand Dominance        Extremity/Trunk Assessment   Upper Extremity Assessment Upper Extremity Assessment: Defer to OT evaluation;Generalized weakness    Lower Extremity Assessment Lower Extremity Assessment: Generalized weakness;RLE deficits/detail RLE Deficits / Details: ankle grossly WFL; knee and hip AAROM grossly WFL within limits of pain RLE: Unable to fully assess due to pain       Communication   Communication: No difficulties  Cognition Arousal/Alertness:  Awake/alert Behavior During Therapy: WFL for tasks assessed/performed Overall Cognitive Status: Within Functional Limits for tasks assessed                                        General Comments      Exercises     Assessment/Plan    PT Assessment Patient needs continued PT services  PT Problem List  Decreased strength;Decreased range of motion;Decreased balance;Decreased mobility;Pain;Decreased activity tolerance;Decreased knowledge of use of DME       PT Treatment Interventions DME instruction;Gait training;Functional mobility training;Stair training;Therapeutic activities;Therapeutic exercise;Patient/family education    PT Goals (Current goals can be found in the Care Plan section)  Acute Rehab PT Goals Patient Stated Goal: home with husband to care for me PT Goal Formulation: With patient Time For Goal Achievement: 02/22/17 Potential to Achieve Goals: Good    Frequency Min 4X/week   Barriers to discharge        Co-evaluation PT/OT/SLP Co-Evaluation/Treatment: Yes Reason for Co-Treatment: For patient/therapist safety PT goals addressed during session: Mobility/safety with mobility;Proper use of DME OT goals addressed during session: ADL's and self-care       AM-PAC PT "6 Clicks" Daily Activity  Outcome Measure Difficulty turning over in bed (including adjusting bedclothes, sheets and blankets)?: Total Difficulty moving from lying on back to sitting on the side of the bed? : Total Difficulty sitting down on and standing up from a chair with arms (e.g., wheelchair, bedside commode, etc,.)?: Total Help needed moving to and from a bed to chair (including a wheelchair)?: Total Help needed walking in hospital room?: Total Help needed climbing 3-5 steps with a railing? : Total 6 Click Score: 6    End of Session Equipment Utilized During Treatment: Gait belt Activity Tolerance: Patient limited by pain Patient left: in chair;with call bell/phone within reach;with family/visitor present   PT Visit Diagnosis: Difficulty in walking, not elsewhere classified (R26.2);Pain Pain - Right/Left: Left Pain - part of body: Hip    Time: 1194-1740 PT Time Calculation (min) (ACUTE ONLY): 16 min   Charges:   PT Evaluation $PT Eval Low Complexity: 1 Procedure     PT G Codes:           Arvon Schreiner 02/15/2017, 11:12 AM

## 2017-02-15 NOTE — Evaluation (Signed)
Occupational Therapy Evaluation Patient Details Name: Samantha Atkins MRN: [id number] DOB: [birth year] Today's Date: 02/15/2017    History of Present Illness Pt s/p fall with seizure.  Pt s/p LEFT ORIF PERIPROSTHETIC FEMORAL FRACTURE AND REVISION HIP ARTHROPLASTY (Left). Pt is PWB   Clinical Impression   Pt is s/p L ORIF resulting in the deficits listed below (see OT Problem List).  Pt will benefit from skilled OT to increase their safety and independence with ADL and functional mobility for ADL to facilitate discharge to venue listed below.      Follow Up Recommendations  SNF;Home health OT;Supervision/Assistance - 24 hour    Equipment Recommendations  3 in 1 bedside commode       Precautions / Restrictions Precautions Precautions: Fall Restrictions Weight Bearing Restrictions: Yes LLE Weight Bearing: Partial weight bearing      Mobility Bed Mobility Overal bed mobility: Needs Assistance Bed Mobility: Supine to Sit     Supine to sit: Mod assist;+2 for physical assistance        Transfers Overall transfer level: Needs assistance Equipment used: Rolling walker (2 wheeled) Transfers: Sit to/from [employer] Transfers Sit to Stand: Max assist;+2 physical assistance;+2 safety/equipment Stand pivot transfers: Max assist;+2 physical assistance;+2 safety/equipment                ADL either performed or assessed with clinical judgement   ADL Overall ADL's : Needs assistance/impaired Eating/Feeding: Set up;Sitting   Grooming: Set up;Sitting                   Toilet Transfer: RW;Stand-pivot;+2 for safety/equipment;+2 for physical assistance;Moderate assistance;Maximal assistance   Toileting- Clothing Manipulation and Hygiene: Sit to/from stand;Total assistance         General ADL Comments: limited ADL assesment at rounding team came into session. Pt will need significant A with all ADL activity at this time.     Vision Patient Visual Report: No  change from baseline              Pertinent Vitals/Pain Pain Assessment: Faces Pain Score: 8  Faces Pain Scale: Hurts whole lot Pain Location: L hip Pain Intervention(s): Limited activity within patient's tolerance;Monitored during session;Premedicated before session;Ice applied;Repositioned        Extremity/Trunk Assessment Upper Extremity Assessment Upper Extremity Assessment: Generalized weakness           Communication Communication Communication: No difficulties   Cognition Arousal/Alertness: Awake/alert Behavior During Therapy: WFL for tasks assessed/performed Overall Cognitive Status: Within Functional Limits for tasks assessed                                                Home Living Family/patient expects to be discharged to:: Private residence Living Arrangements: Spouse/significant other Available Help at Discharge: Family Type of Home: House Home Access: Stairs to enter Technical brewer of Steps: 3   Home Layout: One level     Bathroom Shower/Tub: Teacher, early years/pre: Standard     Home Equipment: Grab bars - tub/shower;Walker - 2 wheels          Prior Functioning/Environment Level of Independence: Independent                 OT Problem List: Decreased strength;Decreased activity tolerance;Pain;Impaired balance (sitting and/or standing);Decreased knowledge of use of DME or AE;Decreased safety awareness  OT Treatment/Interventions: Self-care/ADL training;Patient/family education;DME and/or AE instruction    OT Goals(Current goals can be found in the care plan section) Acute Rehab OT Goals Patient Stated Goal: home with husband to care forme OT Goal Formulation: With patient Time For Goal Achievement: 03/01/17 Potential to Achieve Goals: Good  OT Frequency: Min 2X/week   Barriers to D/C:            Co-evaluation PT/OT/SLP Co-Evaluation/Treatment: Yes Reason for Co-Treatment: For  patient/therapist safety   OT goals addressed during session: ADL's and self-care      AM-PAC PT "6 Clicks" Daily Activity     Outcome Measure Help from another person eating meals?: A Little Help from another person taking care of personal grooming?: A Little Help from another person toileting, which includes using toliet, bedpan, or urinal?: Total Help from another person bathing (including washing, rinsing, drying)?: A Lot Help from another person to put on and taking off regular upper body clothing?: A Lot Help from another person to put on and taking off regular lower body clothing?: Total 6 Click Score: 12   End of Session Equipment Utilized During Treatment: Rolling walker Nurse Communication: Mobility status  Activity Tolerance: Patient tolerated treatment well Patient left: in chair  OT Visit Diagnosis: Unsteadiness on feet (R26.81);Pain                Time: 4037-0964 OT Time Calculation (min): 19 min Charges:  OT General Charges $OT Visit: 1 Procedure OT Evaluation $OT Eval Moderate Complexity: 1 Procedure G-Codes:     Kari Baars, OT [phone]  Payton Mccallum D 02/15/2017, 10:29 AM

## 2017-02-16 ENCOUNTER — Inpatient Hospital Stay (HOSPITAL_COMMUNITY): Payer: Commercial Managed Care - PPO

## 2017-02-16 DIAGNOSIS — D500 Iron deficiency anemia secondary to blood loss (chronic): Secondary | ICD-10-CM

## 2017-02-16 DIAGNOSIS — R7989 Other specified abnormal findings of blood chemistry: Secondary | ICD-10-CM

## 2017-02-16 LAB — HEPATIC FUNCTION PANEL
ALT: 74 U/L — AB (ref 14–54)
AST: 102 U/L — AB (ref 15–41)
Albumin: 3.3 g/dL — ABNORMAL LOW (ref 3.5–5.0)
Alkaline Phosphatase: 56 U/L (ref 38–126)
BILIRUBIN DIRECT: 0.3 mg/dL (ref 0.1–0.5)
BILIRUBIN TOTAL: 1.2 mg/dL (ref 0.3–1.2)
Indirect Bilirubin: 0.9 mg/dL (ref 0.3–0.9)
Total Protein: 5.6 g/dL — ABNORMAL LOW (ref 6.5–8.1)

## 2017-02-16 LAB — BASIC METABOLIC PANEL
Anion gap: 10 (ref 5–15)
BUN: 10 mg/dL (ref 6–20)
CALCIUM: 8.4 mg/dL — AB (ref 8.9–10.3)
CO2: 28 mmol/L (ref 22–32)
CREATININE: 0.39 mg/dL — AB (ref 0.44–1.00)
Chloride: 99 mmol/L — ABNORMAL LOW (ref 101–111)
GFR calc non Af Amer: >60 mL/min (ref >60)
GLUCOSE: 124 mg/dL — AB (ref 65–99)
Potassium: 3.8 mmol/L (ref 3.5–5.1)
Sodium: 137 mmol/L (ref 135–145)

## 2017-02-16 LAB — VITAMIN B12: VITAMIN B 12: 1227 pg/mL — AB (ref 180–914)

## 2017-02-16 LAB — CBC
HEMATOCRIT: 19.1 % — AB (ref 36.0–46.0)
Hemoglobin: 6.9 g/dL — CL (ref 12.0–15.0)
MCH: 33 pg (ref 26.0–34.0)
MCHC: 36.1 g/dL — AB (ref 30.0–36.0)
MCV: 91.4 fL (ref 78.0–100.0)
PLATELETS: 156 10*3/uL (ref 150–400)
RBC: 2.09 MIL/uL — ABNORMAL LOW (ref 3.87–5.11)
RDW: 15.8 % — AB (ref 11.5–15.5)
WBC: 10.5 10*3/uL (ref 4.0–10.5)

## 2017-02-16 LAB — TSH: TSH: 1.758 u[IU]/mL (ref 0.350–4.500)

## 2017-02-16 LAB — FOLATE: Folate: 15.6 ng/mL (ref >5.9)

## 2017-02-16 LAB — MAGNESIUM: Magnesium: 1.8 mg/dL (ref 1.7–2.4)

## 2017-02-16 MED ORDER — SODIUM CHLORIDE 0.9 % IV SOLN: Freq: Once | INTRAVENOUS | Status: DC

## 2017-02-16 MED ORDER — SENNOSIDES-DOCUSATE SODIUM 8.6-50 MG PO TABS
2.0000 | ORAL_TABLET | Freq: Two times a day (BID) | ORAL | Status: DC
  Administered 2017-02-16 – 2017-02-17 (×3): 2 via ORAL
  Filled 2017-02-16 (×3): qty 2

## 2017-02-16 MED ORDER — METHOCARBAMOL 500 MG PO TABS: 500.0000 mg | ORAL_TABLET | Freq: Four times a day (QID) | ORAL | 0 refills | Status: DC | PRN

## 2017-02-16 MED ORDER — DOCUSATE SODIUM 100 MG PO CAPS: 100.0000 mg | ORAL_CAPSULE | Freq: Two times a day (BID) | ORAL | 0 refills | Status: DC

## 2017-02-16 MED ORDER — POLYETHYLENE GLYCOL 3350 17 G PO PACK
17.0000 g | PACK | Freq: Every day | ORAL | Status: DC
  Administered 2017-02-16 – 2017-02-17 (×2): 17 g via ORAL
  Filled 2017-02-16 (×2): qty 1

## 2017-02-16 MED ORDER — POLYETHYLENE GLYCOL 3350 17 G PO PACK: 17.0000 g | PACK | Freq: Every day | ORAL | 0 refills | Status: DC | PRN

## 2017-02-16 MED ORDER — FERROUS SULFATE 325 (65 FE) MG PO TABS: 325.0000 mg | ORAL_TABLET | Freq: Three times a day (TID) | ORAL | 3 refills | Status: DC

## 2017-02-16 MED ORDER — HYDROCODONE-ACETAMINOPHEN 5-325 MG PO TABS: 1.0000 | ORAL_TABLET | ORAL | 0 refills | Status: DC | PRN

## 2017-02-16 NOTE — Progress Notes (Signed)
Physical Therapy Treatment Patient Details Name: Samantha Atkins MRN: [id number] DOB: 10/03/1950 Today's Date: 02/16/2017    History of Present Illness Pt s/p fall with seizure.  Pt s/p LEFT ORIF PERIPROSTHETIC FEMORAL FRACTURE AND REVISION HIP ARTHROPLASTY (Left). Pt is PWB    PT Comments    Pt up to chair with nursing; reporst some fatigue (Hgb 6.9 today) and incr pain; agreeable to exercises; will attempt incr amb if vitals improve tomorrow  Follow Up Recommendations  Home health PT;Supervision/Assistance - 24 hour     Equipment Recommendations  Wheelchair (measurements PT)    Recommendations for Other Services       Precautions / Restrictions Precautions Precautions: Fall Restrictions Weight Bearing Restrictions: Yes LLE Weight Bearing: Partial weight bearing LLE Partial Weight Bearing Percentage or Pounds: 50% Other Position/Activity Restrictions: per orders 50 PWB, per progress notes today TDWB (?)    Mobility  Bed Mobility               General bed mobility comments: pt in chair  Transfers                    Ambulation/Gait                 Stairs            Wheelchair Mobility    Modified Rankin (Stroke Patients Only)       Balance                                            Cognition Arousal/Alertness: Awake/alert Behavior During Therapy: WFL for tasks assessed/performed Overall Cognitive Status: Within Functional Limits for tasks assessed                                        Exercises Total Joint Exercises Ankle Circles/Pumps: AROM;Both;10 reps Quad Sets: AROM;Strengthening;Both;10 reps Short Arc Quad: AROM;Strengthening;Left;10 reps Heel Slides: AAROM;Left;10 reps Hip ABduction/ADduction: AAROM;Left;10 reps;Limitations Hip Abduction/Adduction Limitations: pain--decr ROM to within limits of pain    General Comments        Pertinent Vitals/Pain Pain Assessment: Faces Faces Pain  Scale: Hurts worst Pain Location: L hip and right ribs Pain Descriptors / Indicators: Sore;Guarding;Aching Pain Intervention(s): Limited activity within patient's tolerance;Monitored during session;Premedicated before session;RN gave pain meds during session    Home Living                      Prior Function            PT Goals (current goals can now be found in the care plan section) Acute Rehab PT Goals Patient Stated Goal: home with husband to care for me PT Goal Formulation: With patient Time For Goal Achievement: 02/22/17 Potential to Achieve Goals: Good Progress towards PT goals: Progressing toward goals    Frequency    Min 5X/week      PT Plan Current plan remains appropriate    Co-evaluation              AM-PAC PT "6 Clicks" Daily Activity  Outcome Measure  Difficulty turning over in bed (including adjusting bedclothes, sheets and blankets)?: Total Difficulty moving from lying on back to sitting on the side of the bed? : Total Difficulty sitting down on and  standing up from a chair with arms (e.g., wheelchair, bedside commode, etc,.)?: Total Help needed moving to and from a bed to chair (including a wheelchair)?: Total Help needed walking in hospital room?: Total Help needed climbing 3-5 steps with a railing? : Total 6 Click Score: 6    End of Session   Activity Tolerance: Patient limited by pain;Patient limited by fatigue Patient left: in chair;with call bell/phone within reach;with chair alarm set   PT Visit Diagnosis: Difficulty in walking, not elsewhere classified (R26.2);Pain Pain - Right/Left: Left Pain - part of body: Hip     Time: 1210-1225 PT Time Calculation (min) (ACUTE ONLY): 15 min  Charges:  $Therapeutic Exercise: 8-22 mins                    G CodesKenyon Ana, PT Pager: [phone] 02/16/2017    [facility] 02/16/2017, 12:43 PM

## 2017-02-16 NOTE — Progress Notes (Signed)
PROGRESS NOTE    Samantha Atkins  WNU:[id number] DOB: 1950-12-08 DOA: 02/11/2017 PCP: Patient, No Pcp Per    Brief Narrative:  66 y.o. female with medical history significant of osteoporosis with prior hip fracture, migraine, depression, arthritis who presents after a fall. Patient noted have mechanical fall with subsequent severe left hip pain and was brought to the ED.  She denies any fever, chills, recent illness, nausea, vomiting, constipation, headache, dizziness, LOC, seizure like activity, reflux, leg swelling  Assessment & Plan:   Principal Problem:   Fracture of humerus, proximal, left, closed Active Problems:   Osteoporosis   Migraines   Depression   Protein-calorie malnutrition, severe  Fracture of humerus, proximal, left, closed -s/p "Left hip open reduction and internal fixation of periprosthetic femur fracture with revision of her left hip."on 7/10 -per ortho" Postoperatively, based on her bone stock, she will be limited to touchdown to partial weightbearing for probably 6 to 12 weeks depending on bone healing and her response to the recovery." - management per ortho  Post op anemia:  -Estimated blood loss 1200 during surgery -S/p prbc transfusion x2units -hgb 6.9 on 7/12, prbc transfusion x1 unit ordered by hospitalist cancelled by ortho,  -transfusion will be directed by ortho service.   Seizures -See events from 7/9. Patient noted to have new onset witnessed seizures , patient has husband denies prior diagnosis of  seizure , patient has h/p multiple falls, wonder seizures is a cause. -per 7/9 note: "seizure-like episode with patient staring spell and gritting teeth, tachycardia. Given 55m ativan with gradual improvement. Patient now answering questions slowly and appears post-ictal." -mri brain "Premature atrophy.  Mild small vessel disease. No acute or focal intracranial abnormality is seen." -Neurology consulted. Recs noted, Plan to continue on keppra 5079mbid, No  driving per Neurology recs (husband report patient does not drive at baseline) -Consider outpt w/u to complete syncope w/u --Cont PRN ativan -Seizure free thus far  Urinary retention -"since being in the ED, she has had difficulty with feeling the need to urinate, but not being able to. She has no abdominal pain or swelling." -  UA reviewed. Not suggestive of UTI, cr wnl - opioids analgesics related? She also has constipation, start stool softener,  -foley removed on 7/12, voided      Hypomagnesemia: replace mag  LFT elevation:  tbili/alk phos wnl, elevated ast/ alt No n/v, denies ab pain Will get liver usKoreahepatitis panel     Migraines - We'll continue with Fioricet PRN - at present, stable    Depression - Continue home sertraline - appears to be stable  Osteoporosis - Patient continued on estradiol and vitamin D - currently stable  Right sided rib pain:  cxr no acute findings, +Old healed fractures bilaterally Prn pain meds  H/o multiple falls, multiple factures including bilateral rib fractures, cervical spine fracture (followed by Dr ElIsac Caddyer husband)  Severe malnutrition in context of chronic illness, Underweight Body mass index is 15.73 kg/m.  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, miningshe might have body image issues, she does not want to eat Nutrition consulted,  input appreciated  DVT prophylaxis: Heparin subcutaneous Code Status: Full code Family Communication: patient  Disposition Plan: home health vs snf  Consultants:   Orthopedic surgery  Procedures:  Left hip open reduction and internal fixation of periprosthetic femur fracture with revision of her left hip."on 7/10 prbc transfusion x2 on 7/10  Antimicrobials: Anti-infectives    Start  Dose/Rate Route Frequency Ordered Stop   02/14/17 2100  ceFAZolin (ANCEF) IVPB 2g/100 mL premix     2 g 200 mL/hr over 30 Minutes Intravenous Every 6 hours 02/14/17 1909 02/15/17 0430    02/14/17 1351  ceFAZolin (ANCEF) 2-4 GM/100ML-% IVPB    Comments:  Darlys Gales   : cabinet override      02/14/17 1351 02/14/17 1430   02/14/17 0600  ceFAZolin (ANCEF) IVPB 2g/100 mL premix     2 g 200 mL/hr over 30 Minutes Intravenous On call to O.R. 02/13/17 2051 02/14/17 1435      Subjective: Feeling better, report right sided rib pain  Objective: Vitals:   02/15/17 1428 02/15/17 2052 02/16/17 0539 02/16/17 1421  BP: 126/66 129/73 115/73 (!) 118/54  Pulse: (!) 118 96 95 98  Resp: 18 16 15 14   Temp: 99.1 F (37.3 C) 98.4 F (36.9 C) 99.1 F (37.3 C) 98.3 F (36.8 C)  TempSrc: Oral Oral Oral Oral  SpO2: 97% 99% 97% 97%  Weight:      Height:        Intake/Output Summary (Last 24 hours) at 02/16/17 1433 Last data filed at 02/16/17 1317  Gross per 24 hour  Intake              600 ml  Output             2300 ml  Net            -1700 ml   Filed Weights   02/14/17 0909  Weight: 39 kg (85 lb 15.7 oz)    Examination: General exam: very frail and thin, appear weak, but NAD,  Respiratory system: Normal respiratory effort, no wheezing Cardiovascular system: regular rate, s1, s2 Gastrointestinal system: Soft, nondistended, positive BS Central nervous system: CN2-12 grossly intact, strength intact Extremities: Perfused, no clubbing, left hip post op changes Skin: Normal skin turgor, no notable skin lesions seen Psychiatry: Mood normal // no visual hallucinations    Data Reviewed: I have personally reviewed following labs and imaging studies  CBC:  Recent Labs Lab 02/11/17 2148 02/12/17 0532 02/14/17 0640 02/14/17 1613 02/15/17 0757 02/16/17 0524  WBC 13.7* 11.2* 10.0  --  10.2 10.5  NEUTROABS 11.8*  --   --   --   --   --   HGB 11.5* 11.1* 8.1* 9.9* 7.2* 6.9*  HCT 32.9* 32.5* 22.2* 29.0* 19.8* 19.1*  MCV 99.1 100.0 96.5  --  89.6 91.4  PLT 202 184 136*  --  106* 756   Basic Metabolic Panel:  Recent Labs Lab 02/11/17 2148 02/12/17 0532 02/14/17 0640  02/14/17 1613 02/15/17 0757 02/15/17 0822 02/16/17 0524  NA 142 140 133* 132* 138  --  137  K 3.6 4.1 3.8 4.9 4.8  --  3.8  CL 109 107 97*  --  101  --  99*  CO2 23 25 23   --  25  --  28  GLUCOSE 121* 131* 102*  --  95  --  124*  BUN 15 10 14   --  9  --  10  CREATININE 0.48 0.56 0.49  --  0.46  --  0.39*  CALCIUM 8.3* 8.6* 8.5*  --  8.4*  --  8.4*  MG  --   --   --   --   --  1.6* 1.8   GFR: Estimated Creatinine Clearance: 43.2 mL/min (A) (by C-G formula based on SCr of 0.39 mg/dL (L)). Liver Function Tests:  Recent Labs Lab 02/16/17 0524  AST 102*  ALT 74*  ALKPHOS 56  BILITOT 1.2  PROT 5.6*  ALBUMIN 3.3*   No results for input(s): LIPASE, AMYLASE in the last 168 hours. No results for input(s): AMMONIA in the last 168 hours. Coagulation Profile:  Recent Labs Lab 02/11/17 2148 02/12/17 0532  INR 1.04 1.03   Cardiac Enzymes: No results for input(s): CKTOTAL, CKMB, CKMBINDEX, TROPONINI in the last 168 hours. BNP (last 3 results) No results for input(s): PROBNP in the last 8760 hours. HbA1C: No results for input(s): HGBA1C in the last 72 hours. CBG: No results for input(s): GLUCAP in the last 168 hours. Lipid Profile: No results for input(s): CHOL, HDL, LDLCALC, TRIG, CHOLHDL, LDLDIRECT in the last 72 hours. Thyroid Function Tests:  Recent Labs  02/16/17 0524  TSH 1.758   Anemia Panel:  Recent Labs  02/16/17 0524  VITAMINB12 1,227*  FOLATE 15.6   Sepsis Labs: No results for input(s): PROCALCITON, LATICACIDVEN in the last 168 hours.  Recent Results (from the past 240 hour(s))  Surgical pcr screen     Status: None   Collection Time: 02/12/17 12:05 PM  Result Value Ref Range Status   MRSA, PCR NEGATIVE NEGATIVE Final   Staphylococcus aureus NEGATIVE NEGATIVE Final    Comment:        The Xpert SA Assay (FDA approved for NASAL specimens in patients over 41 years of age), is one component of a comprehensive surveillance program.  Test performance  has been validated by [facility] for patients greater than or equal to 72 year old. It is not intended to diagnose infection nor to guide or monitor treatment.   MRSA PCR Screening     Status: None   Collection Time: 02/13/17 10:15 PM  Result Value Ref Range Status   MRSA by PCR NEGATIVE NEGATIVE Final    Comment:        The GeneXpert MRSA Assay (FDA approved for NASAL specimens only), is one component of a comprehensive MRSA colonization surveillance program. It is not intended to diagnose MRSA infection nor to guide or monitor treatment for MRSA infections.      Radiology Studies: Mr Brain Wo Contrast  Result Date: 02/15/2017 CLINICAL DATA:  Staring episode with confusion. Possible seizure. Serious psychiatric history. EXAM: MRI HEAD WITHOUT CONTRAST TECHNIQUE: Multiplanar, multiecho pulse sequences of the brain and surrounding structures were obtained without intravenous contrast. COMPARISON:  CT head 02/13/2017. FINDINGS: Brain: No acute infarction, hemorrhage, hydrocephalus, extra-axial collection or mass lesion. Premature for age cerebral and cerebellar atrophy. Mild subcortical and periventricular T2 and FLAIR hyperintensities, likely chronic microvascular ischemic change. Thin-section imaging through the temporal lobes demonstrates no asymmetry or mass. Vascular: Flow voids are maintained throughout the carotid, basilar, and vertebral arteries. There are no areas of chronic hemorrhage. Skull and upper cervical spine: Normal marrow signal. Significant anterolisthesis C2 on C3 of 5 mm represents a chronic hangman's fracture. Sinuses/Orbits: Negative. Other: None. IMPRESSION: Premature atrophy.  Mild small vessel disease. No acute or focal intracranial abnormality is seen. 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 The stability of this fracture is unclear. Lateral flexion extension radiographs could assess for dynamic instability. Noncontrast CT of the cervical spine  could assess for interval healing. Electronically Signed   By: Staci Righter M.D.   On: 02/15/2017 09:54   Dg Hip Port Unilat With Pelvis 1v Left  Result Date: 02/14/2017 CLINICAL DATA:  Postoperative evaluation. EXAM: DG HIP (WITH OR WITHOUT PELVIS) 1V PORT  LEFT COMPARISON:  Pelvic radiographs February 11, 2017 FINDINGS: Interval LEFT total hip arthroplasty revision, elongated femoral stem transfixing nondisplaced LEFT femur fracture. New lateral plate and cerclage wires. Femoral prostheses are located, status post RIGHT hip total arthroplasty with lateral plate and cerclage wire fixation. Osteopenia. LEFT hip subcutaneous gas consistent with recent surgery. IMPRESSION: Status post LEFT total hip arthroplasty revision transfixing nondisplaced proximal LEFT femur fracture on single AP view. Old RIGHT THA. Electronically Signed   By: Elon Alas M.D.   On: 02/14/2017 17:37   Dg Femur Port Min 2 Views Left  Result Date: 02/14/2017 CLINICAL DATA:  Periprosthetic femoral fracture. EXAM: LEFT FEMUR PORTABLE 2 VIEWS COMPARISON:  02/11/2017. FINDINGS: There is been revision of the LEFT THA to extend further into the femoral shaft. IMPRESSION: Satisfactory position and alignment THA revision. Electronically Signed   By: Staci Righter M.D.   On: 02/14/2017 16:50    Scheduled Meds: . cholecalciferol  1,000 Units Oral Daily  . feeding supplement (ENSURE ENLIVE)  237 mL Oral BID BM  . ferrous sulfate  325 mg Oral Q breakfast  . heparin  5,000 Units Subcutaneous Q8H  . levETIRAcetam  500 mg Oral BID  . multivitamin with minerals  1 tablet Oral Daily  . polyethylene glycol  17 g Oral Daily  . potassium chloride SA  20 mEq Oral Daily  . senna-docusate  2 tablet Oral BID  . sertraline  100 mg Oral Daily   Continuous Infusions: . sodium chloride    . sodium chloride    . methocarbamol (ROBAXIN)  IV       LOS: 4 days    Time spent> 68mns  Jenalyn Girdner, MD PhD [facility] Pager  [phone] If 7PM-7AM, please contact night-coverage [email or web address] Password [email or web address]7/07/2017, 2:33 PM

## 2017-02-16 NOTE — Progress Notes (Signed)
Patient ID: Samantha Atkins, female   DOB: 05-Oct-1950, 65 y.o.   MRN: [id number] Subjective: 2 Days Post-Op Procedure(s) (LRB): LEFT ORIF PERIPROSTHETIC FEMORAL FRACTURE AND REVISION HIP ARTHROPLASTY (Left)    Patient reports pain as mild to moderate but definitely better than pre-op pain level.  Otherwise no events reported  Objective:   VITALS:   Vitals:   02/15/17 2052 02/16/17 0539  BP: 129/73 115/73  Pulse: 96 95  Resp: 16 15  Temp: 98.4 F (36.9 C) 99.1 F (37.3 C)    Neurovascular intact Incision: dressing C/D/I  LABS  Recent Labs  02/14/17 0640 02/14/17 1613 02/15/17 0757 02/16/17 0524  HGB 8.1* 9.9* 7.2* 6.9*  HCT 22.2* 29.0* 19.8* 19.1*  WBC 10.0  --  10.2 10.5  PLT 136*  --  106* 156     Recent Labs  02/14/17 0640 02/14/17 1613 02/15/17 0757 02/16/17 0524  NA 133* 132* 138 137  K 3.8 4.9 4.8 3.8  BUN 14  --  9 10  CREATININE 0.49  --  0.46 0.39*  GLUCOSE 102*  --  95 124*    No results for input(s): LABPT, INR in the last 72 hours.   Assessment/Plan: 2 Days Post-Op Procedure(s) (LRB): LEFT ORIF PERIPROSTHETIC FEMORAL FRACTURE AND REVISION HIP ARTHROPLASTY (Left)   Up with therapy  ABLA peri-operative   Her vitals at this point are stable despite hgb of 6.9 down from 7.2.  I would suggest monitoring another day, repeat CBC in am and monitor vitals to determine need for further transfusion TWB LLE with therapy Disposition pending

## 2017-02-17 ENCOUNTER — Encounter (HOSPITAL_COMMUNITY): Payer: Self-pay | Admitting: Orthopedic Surgery

## 2017-02-17 ENCOUNTER — Inpatient Hospital Stay (HOSPITAL_COMMUNITY): Payer: Commercial Managed Care - PPO

## 2017-02-17 DIAGNOSIS — Z8781 Personal history of (healed) traumatic fracture: Secondary | ICD-10-CM

## 2017-02-17 DIAGNOSIS — R0781 Pleurodynia: Secondary | ICD-10-CM

## 2017-02-17 DIAGNOSIS — Z967 Presence of other bone and tendon implants: Secondary | ICD-10-CM

## 2017-02-17 LAB — CBC WITH DIFFERENTIAL/PLATELET
Basophils Absolute: 0 10*3/uL (ref 0.0–0.1)
Basophils Relative: 0 %
Eosinophils Absolute: 0.1 10*3/uL (ref 0.0–0.7)
Eosinophils Relative: 1 %
HCT: 19.9 % — ABNORMAL LOW (ref 36.0–46.0)
Hemoglobin: 6.8 g/dL — CL (ref 12.0–15.0)
Lymphocytes Relative: 11 %
Lymphs Abs: 0.8 10*3/uL (ref 0.7–4.0)
MCH: 32.1 pg (ref 26.0–34.0)
MCHC: 34.2 g/dL (ref 30.0–36.0)
MCV: 93.9 fL (ref 78.0–100.0)
Monocytes Absolute: 0.6 10*3/uL (ref 0.1–1.0)
Monocytes Relative: 9 %
Neutro Abs: 5.8 10*3/uL (ref 1.7–7.7)
Neutrophils Relative %: 79 %
Platelets: 188 10*3/uL (ref 150–400)
RBC: 2.12 MIL/uL — ABNORMAL LOW (ref 3.87–5.11)
RDW: 16.7 % — ABNORMAL HIGH (ref 11.5–15.5)
WBC: 7.3 10*3/uL (ref 4.0–10.5)

## 2017-02-17 LAB — COMPREHENSIVE METABOLIC PANEL
ALT: 77 U/L — ABNORMAL HIGH (ref 14–54)
ANION GAP: 7 (ref 5–15)
AST: 67 U/L — AB (ref 15–41)
Albumin: 3.2 g/dL — ABNORMAL LOW (ref 3.5–5.0)
Alkaline Phosphatase: 58 U/L (ref 38–126)
BUN: 12 mg/dL (ref 6–20)
CHLORIDE: 101 mmol/L (ref 101–111)
CO2: 31 mmol/L (ref 22–32)
Calcium: 8.5 mg/dL — ABNORMAL LOW (ref 8.9–10.3)
Creatinine, Ser: 0.31 mg/dL — ABNORMAL LOW (ref 0.44–1.00)
GFR calc Af Amer: >60 mL/min (ref >60)
Glucose, Bld: 128 mg/dL — ABNORMAL HIGH (ref 65–99)
POTASSIUM: 3.9 mmol/L (ref 3.5–5.1)
Sodium: 139 mmol/L (ref 135–145)
Total Bilirubin: 0.9 mg/dL (ref 0.3–1.2)
Total Protein: 5.7 g/dL — ABNORMAL LOW (ref 6.5–8.1)

## 2017-02-17 MED ORDER — LEVETIRACETAM 500 MG PO TABS: 500.0000 mg | ORAL_TABLET | Freq: Two times a day (BID) | ORAL | 0 refills | Status: DC

## 2017-02-17 MED ORDER — SENNOSIDES-DOCUSATE SODIUM 8.6-50 MG PO TABS: 1.0000 | ORAL_TABLET | Freq: Every day | ORAL | 0 refills | Status: AC

## 2017-02-17 MED ORDER — ENSURE ENLIVE PO LIQD: 237.0000 mL | Freq: Two times a day (BID) | ORAL | 12 refills | Status: DC

## 2017-02-17 MED ORDER — MAGNESIUM OXIDE -MG SUPPLEMENT 400 (240 MG) MG PO TABS: 400.0000 mg | ORAL_TABLET | Freq: Every day | ORAL | 0 refills | Status: DC

## 2017-02-17 MED ORDER — FERROUS SULFATE 325 (65 FE) MG PO TBEC: 325.0000 mg | DELAYED_RELEASE_TABLET | ORAL | 0 refills | Status: AC

## 2017-02-17 MED ORDER — LIDOCAINE 5 % EX PTCH: 1.0000 | MEDICATED_PATCH | CUTANEOUS | 0 refills | Status: DC

## 2017-02-17 MED ORDER — LIDOCAINE 5 % EX PTCH
1.0000 | MEDICATED_PATCH | CUTANEOUS | Status: DC
  Administered 2017-02-17: 1 via TRANSDERMAL
  Filled 2017-02-17: qty 1

## 2017-02-17 NOTE — Progress Notes (Signed)
dme 3 in 1 wheelchair

## 2017-02-17 NOTE — Progress Notes (Signed)
   02/17/17 1305  PT Visit Information  Last PT Received On 02/17/17  Pt seen for exercises this pm, reviewed hip precautions and home safety; all concerns addressed; continue to recommend 24 hour assist and HHPT/HHOT  Assistance Needed +1  History of Present Illness Pt s/p fall with seizure.  Pt s/p LEFT ORIF PERIPROSTHETIC FEMORAL FRACTURE AND posterior REVISION HIP ARTHROPLASTY (Left). Pt is PWB  Subjective Data  Subjective I am waiting to call my husband until everything is done  Patient Stated Goal home with husband to care for me  Precautions  Precautions Fall;Posterior Hip  Restrictions  LLE Weight Bearing TWB  Pain Assessment  Pain Assessment Faces  Faces Pain Scale 6  Pain Location L hip and right ribs  Pain Descriptors / Indicators Grimacing;Guarding;Sore  Pain Intervention(s) Monitored during session;Ice applied  Cognition  Arousal/Alertness Awake/alert  Behavior During Therapy WFL for tasks assessed/performed  Overall Cognitive Status Within Functional Limits for tasks assessed  Bed Mobility  General bed mobility comments (back to bed with nursing)  Total Joint Exercises  Ankle Circles/Pumps AROM;Both;10 reps  [employer] Sets AROM;Strengthening;Both;10 reps  Short Arc Quad AROM;Strengthening;Left;10 reps  Heel Slides AAROM;Left;10 reps  Hip ABduction/ADduction AAROM;Left;10 reps;Limitations  PT - End of Session  Activity Tolerance Patient tolerated treatment well  Patient left in bed;with call bell/phone within reach  PT - Assessment/Plan  PT Plan Current plan remains appropriate  PT Visit Diagnosis Difficulty in walking, not elsewhere classified (R26.2);Pain  Pain - Right/Left Left  Pain - part of body Hip  PT Frequency (ACUTE ONLY) Min 5X/week  Follow Up Recommendations Home health PT;Supervision/Assistance - 24 hour  PT equipment 3in1 (PT);Wheelchair (measurements PT)  AM-PAC PT "6 Clicks" Daily Activity Outcome Measure  Difficulty turning over in bed (including  adjusting bedclothes, sheets and blankets)? 1  Difficulty moving from lying on back to sitting on the side of the bed?  1  Difficulty sitting down on and standing up from a chair with arms (e.g., wheelchair, bedside commode, etc,.)? 1  Help needed moving to and from a bed to chair (including a wheelchair)? 1  Help needed walking in hospital room? 2  Help needed climbing 3-5 steps with a railing?  1  6 Click Score 7  Mobility G Code  CM  PT Goal Progression  Progress towards PT goals Progressing toward goals  Acute Rehab PT Goals  PT Goal Formulation With patient  Time For Goal Achievement 02/22/17  Potential to Achieve Goals Good  PT Time Calculation  PT Start Time (ACUTE ONLY) 1148  PT Stop Time (ACUTE ONLY) 1202  PT Time Calculation (min) (ACUTE ONLY) 14 min  PT General Charges  $$ ACUTE PT VISIT 1 Procedure  PT Treatments  $Therapeutic Exercise 8-22 mins

## 2017-02-17 NOTE — Progress Notes (Signed)
Spoke to Dr. Alvan Dame to verify that it was okay to d/c today and no transfusion was needed. Xu MD made aware.

## 2017-02-17 NOTE — Progress Notes (Signed)
Date: February 17, 2017 Chart reviewed for discharge orders: [place] will be done through [place] or [facility] Vernia Buff, [phone]

## 2017-02-17 NOTE — Progress Notes (Signed)
Patient ID: Samantha Atkins, female   DOB: [birth year], 66 y.o.   MRN: [id number] Subjective: 3 Days Post-Op Procedure(s) (LRB): LEFT ORIF PERIPROSTHETIC FEMORAL FRACTURE AND REVISION HIP ARTHROPLASTY (Left)    Patient reports pain as mild to moderate, does complain of some right sided rib pain which could be related to laying on operating room table for procedure (mechanical pain - no X-rays needed).  Otherwise hip pain stable  Objective:   VITALS:   Vitals:   02/16/17 2112 02/17/17 0604  BP: 116/65 121/71  Pulse: 91 83  Resp: 14 14  Temp: 98.9 F (37.2 C) 98.7 F (37.1 C)    Neurovascular intact Incision: dressing C/D/I  LABS  Recent Labs  02/14/17 1613 02/15/17 0757 02/16/17 0524  HGB 9.9* 7.2* 6.9*  HCT 29.0* 19.8* 19.1*  WBC  --  10.2 10.5  PLT  --  106* 156     Recent Labs  02/14/17 1613 02/15/17 0757 02/16/17 0524  NA 132* 138 137  K 4.9 4.8 3.8  BUN  --  9 10  CREATININE  --  0.46 0.39*  GLUCOSE  --  95 124*    No results for input(s): LABPT, INR in the last 72 hours.   Assessment/Plan: 3 Days Post-Op Procedure(s) (LRB): LEFT ORIF PERIPROSTHETIC FEMORAL FRACTURE AND REVISION HIP ARTHROPLASTY (Left)   Up with therapy   ABLA - awaiting am labs to determine need for transfusion today Otherwise can work towards discharge today versus tomorrow depending on transfusion needs TDWB per PT Home health PT vs STSNF, pt wants home RTC in 2 weeks

## 2017-02-17 NOTE — Progress Notes (Signed)
   Dr. Alvan Dame has reviewed the labs, they were not available earlier when saw the pt in the hospital. H&H are stable.   Her vitals are stable as well. Feels that she would not need a blood transfusion.   D/c patient when appropriate.                [place] Tyronne Blann   PA-C  02/17/2017, 9:08 AM

## 2017-02-17 NOTE — Progress Notes (Signed)
Date: February 17, 2017  Discharge orders review for case management needs. Home PT patient wants to use [facility].  Representative notified. Per patient or family member no additional needs at home. Velva Harman, BSN, [place], CCM:  [phone]

## 2017-02-17 NOTE — Discharge Summary (Signed)
Discharge Summary  Samantha Atkins IHW:[id number] DOB: 07-23-1951  PCP: Patient, No Pcp Per  Admit date: 02/11/2017 Discharge date: 02/17/2017  Time spent: >33mns, more than 50% time spent on coordination of care  Recommendations for Outpatient Follow-up:  1. F/u with PMD within a week  for hospital discharge follow up, repeat cbc/bmp/lft at follow up 2. F/u with ortho dr oAlvan Dame3. F/u with neurology for seizure 4. F/u with neurosurgery Dr Samantha Routefor old c2 fracture  Discharge Diagnoses:  Active Hospital Problems   Diagnosis Date Noted  . Fracture of humerus, proximal, left, closed 08/07/2015  . Protein-calorie malnutrition, severe 02/15/2017  . Osteoporosis   . Migraines   . Depression     Resolved Hospital Problems   Diagnosis Date Noted Date Resolved  No resolved problems to display.    Discharge Condition: stable  Diet recommendation: regular diet  Filed Weights   02/14/17 0909  Weight: 39 kg (85 lb 15.7 oz)    History of present illness:  PCP: Dr. VNoni Saupeat [facility] [place]: [place]   Chief Complaint: Hip pain, fall  HPI: Samantha Cottrellis a 66y.o. female with medical history significant of osteoporosis with hip fracture in the past, migraine, depression, arthritis who presents after a fall.  Her husband fills in part of the story.  Apparently today, they were together in the bedroom when Samantha Atkins seemed to stumble on her feet (though was standing still) and fell on her left side and bumped her head on a rocking chair.  She had no loss of consciousness, just seemed to lose her balance.  She reports prior to this, she was in her normal state of health and was being active today.  She developed severe left hip pain and was brought to the ED.  She denies any fever, chills, recent illness, nausea, vomiting, constipation, headache, dizziness, LOC, seizure like activity, reflux, leg swelling.    Of note, since being in the ED, she has had difficulty with feeling the need  to urinate, but not being able to. She has no abdominal pain or swelling.  She does not note dysuria or increased frequency of urination.    ED Course: In the ED, she was noted to have a leukocytosis to 13.7 with a neutrophil predominance.  She had a hip xray which showed femur fracture but with well seated hip prosthesis.  She had a CXR without lung pathology, but noted aortic atherosclerosis.    Hospital Course:  Principal Problem:   Fracture of humerus, proximal, left, closed Active Problems:   Osteoporosis   Migraines   Depression   Protein-calorie malnutrition, severe   Fracture of humerus, proximal, left, closed -s/p "Left hip open reduction and internal fixation of periprosthetic femur fracture with revision of her left hip."on 7/10 -per ortho" Postoperatively, based on her bone stock, she will be limited to touchdown to partial weightbearing for probably 6 to 12 weeks depending on bone healing and her response to the recovery." - management per ortho  Post op anemia:  -Estimated blood loss 1200 during surgery -S/p prbc transfusion x2units -hgb 6.9 on 7/12, prbc transfusion x1 unit ordered by hospitalist cancelled by ortho,  -transfusion will be directed by ortho service.  hgb 6.8 on 7/13, no blood transfusion per ortho, she is discharged on iron supplement  Seizures -See events from 7/9. Patient noted to have new onset witnessed seizures , patient has husband denies prior diagnosis of  seizure , patient has h/p multiple  falls, wonder seizures is a cause. -per 7/9 note: "seizure-like episode with patient staring spell and gritting teeth, tachycardia. Given 71m ativan with gradual improvement. Patient now answering questions slowly and appears post-ictal." -mri brain "Premature atrophy. Mild small vessel disease. No acute or focal intracranial abnormality is seen." -Neurology consulted. Recs noted, Plan to continue on keppra 5062mbid, No driving per Neurology recs (husband  report patient does not drive at baseline) -Consider outpt w/u to complete syncope w/u --Cont PRN ativan -Seizure free thus far  Urinary retention, resolved -"since being in the ED, she has had difficulty with feeling the need to urinate, but not being able to. She has no abdominal pain or swelling." -  UA reviewed. Not suggestive of UTI, cr wnl - opioids analgesics related? She also has constipation, start stool softener,  -foley removed on 7/12, voided    Hypokalemia/Hypomagnesemia: replace k/mag  LFT elevation:  tbili/alk phos wnl, elevated ast/ alt, she denies alcohol use No n/v, denies ab pain,  liver [place]1. Nonspecific mild gallbladder wall thickening,  At least 1 echogenic mass in the right hepatic lobe measuring 1.3 cm, possible hemangioma however suggest correlation with nonemergent MRI for further evaluation"  hepatitis panel pending , pmd to follow up on final result and to arrange outpatient liver mri.   Migraines - We'll continue with Fioricet PRN - at present, stable  Depression - Continue home sertraline - appears to be stable  Osteoporosis - Patient continued on estradiol and vitamin D - currently stable  Right sided rib pain:  cxr no acute findings, +Old healed fractures bilaterally Prn pain meds, topical lidocaine patch   H/o multiple falls, multiple factures including bilateral rib fractures, cervical spine fracture (followed by Dr ElIsac Caddyer husband)  Severe malnutrition in context of chronic illness, Underweight Body mass index is 15.73 kg/m.  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, miningshe might have body image issues, she does not want to eat Nutrition consulted,  input appreciated  Constipation: stool softener  DVT prophylaxis while in the hospital: Heparin subcutaneous Code Status: Full code Family Communication: patient  Disposition Plan: home with home health , patient declined snf  Consultants:   Orthopedic  surgery  Procedures:  Left hip open reduction and internal fixation of periprosthetic femur fracture with revision of her left hip."on 7/10 prbc transfusion x2 on 7/10   Discharge Exam: BP 121/71 (BP Location: Right Arm)   Pulse 83   Temp 98.7 F (37.1 C) (Oral)   Resp 14   Ht 5' 2"  (1.575 m)   Wt 39 kg (85 lb 15.7 oz)   SpO2 99%   BMI 15.73 kg/m   General: very frail and thin but NAD Cardiovascular: RRR Respiratory: CTABL Extremity: left hip post op changes  Discharge Instructions You were cared for by a hospitalist during your hospital stay. If you have any questions about your discharge medications or the care you received while you were in the hospital after you are discharged, you can call the unit and asked to speak with the hospitalist on call if the hospitalist that took care of you is not available. Once you are discharged, your primary care physician will handle any further medical issues. Please note that NO REFILLS for any discharge medications will be authorized once you are discharged, as it is imperative that you return to your primary care physician (or establish a relationship with a primary care physician if you do not have one) for your aftercare needs  so that they can reassess your need for medications and monitor your lab values.  Discharge Instructions    Diet general    Complete by:  As directed    Increase activity slowly    Complete by:  As directed    Touch down weight bearing    Complete by:  As directed    Laterality:  left   Extremity:  Lower     Allergies as of 02/17/2017      Reactions   Dust Mite Extract Cough      Medication List    STOP taking these medications   ferrous sulfate 325 (65 FE) MG tablet Replaced by:  ferrous sulfate 325 (65 FE) MG EC tablet     TAKE these medications   butalbital-acetaminophen-caffeine 50-325-40 MG tablet Commonly known as:  FIORICET, ESGIC Take 1 tablet by mouth every 6 (six) hours as needed for  migraine.   cholecalciferol 1000 units tablet Commonly known as:  VITAMIN D Take 1,000 Units by mouth daily.   docusate sodium 100 MG capsule Commonly known as:  COLACE Take 1 capsule (100 mg total) by mouth 2 (two) times daily.   estradiol 1 MG tablet Commonly known as:  ESTRACE Take 1 mg by mouth daily.   feeding supplement (ENSURE ENLIVE) Liqd Take 237 mLs by mouth 2 (two) times daily between meals.   ferrous sulfate 325 (65 FE) MG EC tablet Take 1 tablet (325 mg total) by mouth every other day. Replaces:  ferrous sulfate 325 (65 FE) MG tablet   HYDROcodone-acetaminophen 5-325 MG tablet Commonly known as:  NORCO/VICODIN Take 1-2 tablets by mouth every 4 (four) hours as needed for moderate pain.   levETIRAcetam 500 MG tablet Commonly known as:  KEPPRA Take 1 tablet (500 mg total) by mouth 2 (two) times daily.   lidocaine 5 % Commonly known as:  LIDODERM Place 1 patch onto the skin daily. Remove & Discard patch within 12 hours or as directed by MD   Magnesium Oxide 400 (240 Mg) MG Tabs Take 1 tablet (400 mg total) by mouth daily.   methocarbamol 500 MG tablet Commonly known as:  ROBAXIN Take 1 tablet (500 mg total) by mouth every 6 (six) hours as needed for muscle spasms.   multivitamin with minerals Tabs tablet Take 1 tablet by mouth daily.   polyethylene glycol packet Commonly known as:  MIRALAX / GLYCOLAX Take 17 g by mouth daily as needed for mild constipation.   potassium chloride SA 20 MEQ tablet Commonly known as:  K-DUR,KLOR-CON Take 20 mEq by mouth daily.   senna-docusate 8.6-50 MG tablet Commonly known as:  Senokot-S Take 1 tablet by mouth at bedtime.   sertraline 100 MG tablet Commonly known as:  ZOLOFT Take 100 mg by mouth daily.            Durable Medical Equipment        Start     Ordered   02/17/17 1159  For home use only DME Other see comment  Once    Comments:  3 in 1 commode   02/17/17 1159   02/17/17 1156  For home use only  DME standard manual wheelchair with seat cushion  Once    Comments:  Patient suffers from weakness and unstable gait which impairs their ability to perform daily activities like Ambulation in the home.  A rolling walker will not resolve  issue with performing activities of daily living. A wheelchair will allow patient to safely perform daily  activities. Patient can safely propel the wheelchair in the home or has a caregiver who can provide assistance.  Accessories: elevating leg rests (ELRs), wheel locks, extensions and anti-tippers.   02/17/17 1158     Allergies  Allergen Reactions  . Dust Mite Extract Cough   Follow-up Information    Kristeen Miss, MD Follow up.   Specialty:  Neurosurgery Why:  c2 fracutre Contact information: [address] [place] [address] [phone]        Paralee Cancel, MD. Schedule an appointment as soon as possible for a visit in 2 week(s).   Specialty:  Orthopedic Surgery Contact information: [address] [place] [address] [phone]        [facility] Follow up in 1 month(s).   Why:  seizure Contact information: [address]     [place] [id number] [phone]       follow up with primary care physician for hospital discharge follow up Follow up in 2 week(s).   Why:  repeat cbc/bmp /liver function at follow up pmd to arrange liver MRI for "right hepatic lobe lesion measuring 1.3 cm" pmd to arrange outpateint syncope work up as well           The results of significant diagnostics from this hospitalization (including imaging, microbiology, ancillary and laboratory) are listed below for reference.    Significant Diagnostic Studies: Ct Head Wo Contrast  Result Date: 02/13/2017 CLINICAL DATA:  Recurrent seizure like episodes, improved with Ativan. Status post fall with LEFT hip injury. History of migraines, headache. EXAM: CT HEAD WITHOUT  CONTRAST TECHNIQUE: Contiguous axial images were obtained from the base of the skull through the vertex without intravenous contrast. COMPARISON:  CT HEAD July 24, 2016 FINDINGS: BRAIN: No intraparenchymal hemorrhage, mass effect nor midline shift. The ventricles and sulci are mildly prominent for patient's age. Patchy supratentorial white matter hypodensities. No acute large vascular territory infarcts. No abnormal extra-axial fluid collections. Basal cisterns are patent. VASCULAR: Mild calcific atherosclerosis of the carotid siphons. SKULL: No skull fracture. Severe bilateral temporomandibular osteoarthrosis. No significant scalp soft tissue swelling. SINUSES/ORBITS: The mastoid air-cells and included paranasal sinuses are well-aerated.The included ocular globes and orbital contents are non-suspicious. OTHER: None. IMPRESSION: 1. No acute intracranial process. 2. Stable mild parenchymal brain volume loss for age and mild chronic small vessel ischemic disease. Electronically Signed   By: Elon Alas M.D.   On: 02/13/2017 21:39   Mr Brain Wo Contrast  Result Date: 02/15/2017 CLINICAL DATA:  Staring episode with confusion. Possible seizure. Serious psychiatric history. EXAM: MRI HEAD WITHOUT CONTRAST TECHNIQUE: Multiplanar, multiecho pulse sequences of the brain and surrounding structures were obtained without intravenous contrast. COMPARISON:  CT head 02/13/2017. FINDINGS: Brain: No acute infarction, hemorrhage, hydrocephalus, extra-axial collection or mass lesion. Premature for age cerebral and cerebellar atrophy. Mild subcortical and periventricular T2 and FLAIR hyperintensities, likely chronic microvascular ischemic change. Thin-section imaging through the temporal lobes demonstrates no asymmetry or mass. Vascular: Flow voids are maintained throughout the carotid, basilar, and vertebral arteries. There are no areas of chronic hemorrhage. Skull and upper cervical spine: Normal marrow signal.  Significant anterolisthesis C2 on C3 of 5 mm represents a chronic hangman's fracture. Sinuses/Orbits: Negative. Other: None. IMPRESSION: Premature atrophy.  Mild small vessel disease. No acute or focal intracranial abnormality is seen. 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 The stability of this fracture is unclear. Lateral flexion extension radiographs could assess for dynamic instability.  Noncontrast CT of the cervical spine could assess for interval healing. Electronically Signed   By: Staci Righter M.D.   On: 02/15/2017 09:54   Dg Chest Port 1 View  Result Date: 02/17/2017 CLINICAL DATA:  Right posterior thorax pain since waking up from surgery on 02/14/17 EXAM: PORTABLE CHEST - 1 VIEW COMPARISON:  02/11/2017 FINDINGS: Relatively low lung volumes with some crowding of bronchovascular structures. No confluent airspace disease or overt edema. Heart size and mediastinal contours are within normal limits. Atheromatous aorta. No effusion.No pneumothorax. Stable old bilateral rib fractures. IMPRESSION: No acute cardiopulmonary disease. Electronically Signed   By: Lucrezia Europe M.D.   On: 02/17/2017 10:16   Dg Chest Port 1 View  Result Date: 02/11/2017 CLINICAL DATA:  Pain following fall EXAM: PORTABLE CHEST 1 VIEW COMPARISON:  None. FINDINGS: Lungs are clear. Heart size and pulmonary vascularity are normal. No adenopathy. There is aortic atherosclerosis. No pneumothorax. There old healed rib fractures bilaterally. No acute fracture evident. There is postoperative change in the proximal left humerus. IMPRESSION: No edema or consolidation. Old healed fractures bilaterally. No pneumothorax. There is aortic atherosclerosis. Aortic Atherosclerosis (ICD10-I70.0). Electronically Signed   By: Lowella Grip III M.D.   On: 02/11/2017 21:21   Dg Hip Port Unilat With Pelvis 1v Left  Result Date: 02/14/2017 CLINICAL DATA:  Postoperative evaluation. EXAM: DG HIP (WITH OR WITHOUT PELVIS) 1V PORT LEFT  COMPARISON:  Pelvic radiographs February 11, 2017 FINDINGS: Interval LEFT total hip arthroplasty revision, elongated femoral stem transfixing nondisplaced LEFT femur fracture. New lateral plate and cerclage wires. Femoral prostheses are located, status post RIGHT hip total arthroplasty with lateral plate and cerclage wire fixation. Osteopenia. LEFT hip subcutaneous gas consistent with recent surgery. IMPRESSION: Status post LEFT total hip arthroplasty revision transfixing nondisplaced proximal LEFT femur fracture on single AP view. Old RIGHT THA. Electronically Signed   By: Elon Alas M.D.   On: 02/14/2017 17:37   Dg Hip Unilat With Pelvis 2-3 Views Left  Result Date: 02/11/2017 CLINICAL DATA:  Left hip pain with external rotation and shortening after a fall. EXAM: DG HIP (WITH OR WITHOUT PELVIS) 2-3V LEFT COMPARISON:  None. FINDINGS: Obliquely oriented acute fracture of the left proximal femur extending from greater trochanter to the medial proximal diaphysis with lateral apex angulation. The left hip prosthesis is well seated in the acetabular fossa without dislocation of the prosthesis. Right proximal femur prosthesis is partially visualized and appears intact. No pelvic fracture or diastases identified. IMPRESSION: Obliquely oriented acute fracture of the left proximal femur extending from greater trochanter to the medial proximal diaphysis with lateral apex angulation. Electronically Signed   By: Kristine Garbe M.D.   On: 02/11/2017 20:49   Dg Femur Port Min 2 Views Left  Result Date: 02/14/2017 CLINICAL DATA:  Periprosthetic femoral fracture. EXAM: LEFT FEMUR PORTABLE 2 VIEWS COMPARISON:  02/11/2017. FINDINGS: There is been revision of the LEFT THA to extend further into the femoral shaft. IMPRESSION: Satisfactory position and alignment THA revision. Electronically Signed   By: Staci Righter M.D.   On: 02/14/2017 16:50   US Abdomen Limited Ruq  Result Date: 02/16/2017 CLINICAL DATA:   Elevated LFTs EXAM: ULTRASOUND ABDOMEN LIMITED RIGHT UPPER QUADRANT COMPARISON:  None. FINDINGS: Gallbladder: Negative for shadowing stones. Mild wall thickening up to 3.7 mm. Negative sonographic Murphy. Common bile duct: Diameter: 2.5 mm Liver: Echogenic mass in the right lobe measuring 1.1 x 1.1 by 1.3 cm. Possible additional focal hyperdense nodule within the posterior right hepatic lobe.  IMPRESSION: 1. Nonspecific mild gallbladder wall thickening, can be seen with acute or chronic cholecystitis, liver disease, or hypoproteinemia. Negative for shadowing stones 2. At least 1 echogenic mass in the right hepatic lobe measuring 1.3 cm, possible hemangioma however suggest correlation with nonemergent MRI for further evaluation Electronically Signed   By: Donavan Foil M.D.   On: 02/16/2017 19:54    Microbiology: Recent Results (from the past 240 hour(s))  Surgical pcr screen     Status: None   Collection Time: 02/12/17 12:05 PM  Result Value Ref Range Status   MRSA, PCR NEGATIVE NEGATIVE Final   Staphylococcus aureus NEGATIVE NEGATIVE Final    Comment:        The Xpert SA Assay (FDA approved for NASAL specimens in patients over 39 years of age), is one component of a comprehensive surveillance program.  Test performance has been validated by [facility] for patients greater than or equal to 33 year old. It is not intended to diagnose infection nor to guide or monitor treatment.   MRSA PCR Screening     Status: None   Collection Time: 02/13/17 10:15 PM  Result Value Ref Range Status   MRSA by PCR NEGATIVE NEGATIVE Final    Comment:        The GeneXpert MRSA Assay (FDA approved for NASAL specimens only), is one component of a comprehensive MRSA colonization surveillance program. It is not intended to diagnose MRSA infection nor to guide or monitor treatment for MRSA infections.      Labs: Basic Metabolic Panel:  Recent Labs Lab 02/12/17 0532 02/14/17 4801 02/14/17 1613  02/15/17 0757 02/15/17 0822 02/16/17 0524 02/17/17 0739  NA 140 133* 132* 138  --  137 139  K 4.1 3.8 4.9 4.8  --  3.8 3.9  CL 107 97*  --  101  --  99* 101  CO2 25 23  --  25  --  28 31  GLUCOSE 131* 102*  --  95  --  124* 128*  BUN 10 14  --  9  --  10 12  CREATININE 0.56 0.49  --  0.46  --  0.39* 0.31*  CALCIUM 8.6* 8.5*  --  8.4*  --  8.4* 8.5*  MG  --   --   --   --  1.6* 1.8  --    Liver Function Tests:  Recent Labs Lab 02/16/17 0524 02/17/17 0739  AST 102* 67*  ALT 74* 77*  ALKPHOS 56 58  BILITOT 1.2 0.9  PROT 5.6* 5.7*  ALBUMIN 3.3* 3.2*   No results for input(s): LIPASE, AMYLASE in the last 168 hours. No results for input(s): AMMONIA in the last 168 hours. CBC:  Recent Labs Lab 02/11/17 2148 02/12/17 0532 02/14/17 0640 02/14/17 1613 02/15/17 0757 02/16/17 0524 02/17/17 0739  WBC 13.7* 11.2* 10.0  --  10.2 10.5 7.3  NEUTROABS 11.8*  --   --   --   --   --  5.8  HGB 11.5* 11.1* 8.1* 9.9* 7.2* 6.9* 6.8*  HCT 32.9* 32.5* 22.2* 29.0* 19.8* 19.1* 19.9*  MCV 99.1 100.0 96.5  --  89.6 91.4 93.9  PLT 202 184 136*  --  106* 156 188   Cardiac Enzymes: No results for input(s): CKTOTAL, CKMB, CKMBINDEX, TROPONINI in the last 168 hours. BNP: BNP (last 3 results) No results for input(s): BNP in the last 8760 hours.  ProBNP (last 3 results) No results for input(s): PROBNP in the last 8760 hours.  CBG: No results for input(s): GLUCAP in the last 168 hours.     SignedFlorencia Reasons MD, PhD  [facility] 02/17/2017, 12:41 PM

## 2017-02-17 NOTE — Progress Notes (Signed)
Occupational Therapy Treatment Patient Details Name: Samantha Atkins MRN: [id number] DOB: Jul 11, 1951 Today's Date: 02/17/2017    History of present illness Pt s/p fall with seizure.  Pt s/p LEFT ORIF PERIPROSTHETIC FEMORAL FRACTURE AND posterior REVISION HIP ARTHROPLASTY (Left). Pt is PWB   OT comments  Pt verbalizes TDWB and posterior precautions.  Pt painful with movement  Follow Up Recommendations  Home health OT;Supervision/Assistance - 24 hour    Equipment Recommendations  3 in 1 bedside commode (delivered)    Recommendations for Other Services      Precautions / Restrictions Precautions Precautions: Fall;Posterior Hip Restrictions LLE Weight Bearing: Touchdown weight bearing Other Position/Activity Restrictions: now changed to TDWB        Mobility Bed Mobility Overal bed mobility: Needs Assistance Bed Mobility: Supine to Sit     Supine to sit: Min assist Sit to supine: Min assist   General bed mobility comments: utilized pad to assist with scooting.  Help with trunk to sit up and help with legs when lying back down  Transfers Overall transfer level: Needs assistance Equipment used: Rolling walker (2 wheeled) Transfers: Sit to/from Stand Sit to Stand: Min assist Stand pivot transfers: Min assist       General transfer comment: assist to rise and stabilize.  Steadying assist for SPT as pt was shaky    Balance   Sitting-balance support: Single extremity supported;Feet supported Sitting balance-Leahy Scale: Fair       Standing balance-Leahy Scale: Poor Standing balance comment: reliant on UE support and external assist                            ADL either performed or assessed with clinical judgement   ADL                           Toilet Transfer: Minimal assistance;Regular Toilet;RW;Stand-pivot   Toileting- Clothing Manipulation and Hygiene: Moderate assistance;Sit to/from stand         General ADL Comments: donned dress  with set up sitting. Educated and demonstrated AE.  Pt states husband will assist, but she may want a reacher.  Reviewed posterior THPs     Vision       Perception     Praxis      Cognition Arousal/Alertness: Awake/alert Behavior During Therapy: WFL for tasks assessed/performed Overall Cognitive Status: Within Functional Limits for tasks assessed                                          Exercises    Shoulder Instructions       General Comments      Pertinent Vitals/ Pain       Pain Assessment: Faces Pain Score: 8  Faces Pain Scale: Hurts even more Pain Location: L hip and right ribs Pain Descriptors / Indicators: Grimacing;Guarding;Sore Pain Intervention(s): Limited activity within patient's tolerance;Monitored during session;Repositioned;Ice applied  Home Living                                          Prior Functioning/Environment              Frequency           Progress Toward Goals  OT Goals(current goals can now be found in the care plan section)  Progress towards OT goals: Progressing toward goals  Acute Rehab OT Goals Patient Stated Goal: home with husband to care for me Time For Goal Achievement: 03/01/17  Plan      Co-evaluation                 AM-PAC PT "6 Clicks" Daily Activity     Outcome Measure   Help from another person eating meals?: None Help from another person taking care of personal grooming?: A Little Help from another person toileting, which includes using toliet, bedpan, or urinal?: A Little Help from another person bathing (including washing, rinsing, drying)?: A Lot Help from another person to put on and taking off regular upper body clothing?: A Little Help from another person to put on and taking off regular lower body clothing?: Total 6 Click Score: 16    End of Session        Activity Tolerance Patient limited by pain   Patient Left in bed;with call bell/phone  within reach   Nurse Communication          Time: 5320-2334 OT Time Calculation (min): 27 min  Charges: OT General Charges $OT Visit: 1 Procedure OT Treatments $Self Care/Home Management : 23-37 mins  Lesle Chris, OTR/L [phone] 02/17/2017   [place] 02/17/2017, 2:08 PM

## 2017-02-17 NOTE — Discharge Instructions (Signed)
INSTRUCTIONS AFTER JOINT REPLACEMENT   o Remove items at home which could result in a fall. This includes throw rugs or furniture in walking pathways o ICE to the affected joint every three hours while awake for 30 minutes at a time, for at least the first 3-5 days, and then as needed for pain and swelling.  Continue to use ice for pain and swelling. You may notice swelling that will progress down to the foot and ankle.  This is normal after surgery.  Elevate your leg when you are not up walking on it.   o Continue to use the breathing machine you got in the hospital (incentive spirometer) which will help keep your temperature down.  It is common for your temperature to cycle up and down following surgery, especially at night when you are not up moving around and exerting yourself.  The breathing machine keeps your lungs expanded and your temperature down.   DIET:  As you were doing prior to hospitalization, we recommend a well-balanced diet.  DRESSING / WOUND CARE / SHOWERING  Keep the surgical dressing until follow up.  The dressing is water proof, so you can shower without any extra covering.  IF THE DRESSING FALLS OFF or the wound gets wet inside, change the dressing with sterile gauze.  Please use good hand washing techniques before changing the dressing.  Do not use any lotions or creams on the incision until instructed by your surgeon.    ACTIVITY  o Increase activity slowly as tolerated, but follow the weight bearing instructions below.   o No driving for 6 weeks or until further direction given by your physician.  You cannot drive while taking narcotics.  o No lifting or carrying greater than 10 lbs. until further directed by your surgeon. o Avoid periods of inactivity such as sitting longer than an hour when not asleep. This helps prevent blood clots.  o You may return to work once you are authorized by your doctor.     WEIGHT BEARING   Other:  touch down weight bearing for 6-8  weeks   EXERCISES  Results after joint replacement surgery are often greatly improved when you follow the exercise, range of motion and muscle strengthening exercises prescribed by your doctor. Safety measures are also important to protect the joint from further injury. Any time any of these exercises cause you to have increased pain or swelling, decrease what you are doing until you are comfortable again and then slowly increase them. If you have problems or questions, call your caregiver or physical therapist for advice.   Rehabilitation is important following a joint replacement. After just a few days of immobilization, the muscles of the leg can become weakened and shrink (atrophy).  These exercises are designed to build up the tone and strength of the thigh and leg muscles and to improve motion. Often times heat used for twenty to thirty minutes before working out will loosen up your tissues and help with improving the range of motion but do not use heat for the first two weeks following surgery (sometimes heat can increase post-operative swelling).   These exercises can be done on a training (exercise) mat, on the floor, on a table or on a bed. Use whatever works the best and is most comfortable for you.    Use music or television while you are exercising so that the exercises are a pleasant break in your day. This will make your life better with the exercises acting  as a break in your routine that you can look forward to.   Perform all exercises about fifteen times, three times per day or as directed.  You should exercise both the operative leg and the other leg as well.  Exercises include:    Quad Sets - Tighten up the muscle on the front of the thigh (Quad) and hold for 5-10 seconds.    Straight Leg Raises - With your knee straight (if you were given a brace, keep it on), lift the leg to 60 degrees, hold for 3 seconds, and slowly lower the leg.  Perform this exercise against resistance later  as your leg gets stronger.   Leg Slides: Lying on your back, slowly slide your foot toward your buttocks, bending your knee up off the floor (only go as far as is comfortable). Then slowly slide your foot back down until your leg is flat on the floor again.   Angel Wings: Lying on your back spread your legs to the side as far apart as you can without causing discomfort.   Hamstring Strength:  Lying on your back, push your heel against the floor with your leg straight by tightening up the muscles of your buttocks.  Repeat, but this time bend your knee to a comfortable angle, and push your heel against the floor.  You may put a pillow under the heel to make it more comfortable if necessary.   A rehabilitation program following joint replacement surgery can speed recovery and prevent re-injury in the future due to weakened muscles. Contact your doctor or a physical therapist for more information on knee rehabilitation.    CONSTIPATION  Constipation is defined medically as fewer than three stools per week and severe constipation as less than one stool per week.  Even if you have a regular bowel pattern at home, your normal regimen is likely to be disrupted due to multiple reasons following surgery.  Combination of anesthesia, postoperative narcotics, change in appetite and fluid intake all can affect your bowels.   YOU MUST use at least one of the following options; they are listed in order of increasing strength to get the job done.  They are all available over the counter, and you may need to use some, POSSIBLY even all of these options:    Drink plenty of fluids (prune juice may be helpful) and high fiber foods Colace 100 mg by mouth twice a day  Senokot for constipation as directed and as needed Dulcolax (bisacodyl), take with full glass of water  Miralax (polyethylene glycol) once or twice a day as needed.  If you have tried all these things and are unable to have a bowel movement in the first  3-4 days after surgery call either your surgeon or your primary doctor.    If you experience loose stools or diarrhea, hold the medications until you stool forms back up.  If your symptoms do not get better within 1 week or if they get worse, check with your doctor.  If you experience "the worst abdominal pain ever" or develop nausea or vomiting, please contact the office immediately for further recommendations for treatment.   ITCHING:  If you experience itching with your medications, try taking only a single pain pill, or even half a pain pill at a time.  You can also use Benadryl over the counter for itching or also to help with sleep.   TED HOSE STOCKINGS:  Use stockings on both legs until for at least 2  weeks or as directed by physician office. They may be removed at night for sleeping.  MEDICATIONS:  See your medication summary on the After Visit Summary that nursing will review with you.  You may have some home medications which will be placed on hold until you complete the course of blood thinner medication.  It is important for you to complete the blood thinner medication as prescribed.  PRECAUTIONS:  If you experience chest pain or shortness of breath - call 911 immediately for transfer to the hospital emergency department.   If you develop a fever greater that 101 F, purulent drainage from wound, increased redness or drainage from wound, foul odor from the wound/dressing, or calf pain - CONTACT YOUR SURGEON.                                                   FOLLOW-UP APPOINTMENTS:  If you do not already have a post-op appointment, please call the office for an appointment to be seen by your surgeon.  Guidelines for how soon to be seen are listed in your After Visit Summary, but are typically between 1-4 weeks after surgery.  OTHER INSTRUCTIONS:   Knee Replacement:  Do not place pillow under knee, focus on keeping the knee straight while resting. CPM instructions: 0-90 degrees, 2  hours in the morning, 2 hours in the afternoon, and 2 hours in the evening. Place foam block, curve side up under heel at all times except when in CPM or when walking.  DO NOT modify, tear, cut, or change the foam block in any way.  MAKE SURE YOU:   Understand these instructions.   Get help right away if you are not doing well or get worse.    Thank you for letting us be a part of your medical care team.  It is a privilege we respect greatly.  We hope these instructions will help you stay on track for a fast and full recovery!

## 2017-02-17 NOTE — Progress Notes (Signed)
Physical Therapy Treatment Patient Details Name: Samantha Atkins MRN: [id number] DOB: 12-26-1950 Today's Date: 02/17/2017    History of Present Illness Pt s/p fall with seizure.  Pt s/p LEFT ORIF PERIPROSTHETIC FEMORAL FRACTURE AND REVISION HIP ARTHROPLASTY (Left). Pt is PWB    PT Comments    Pt progressing slowly; very cooperative but pain and fatigue limiting factors; Pt will need 24hour assist at home, recommend w/c and 3in1 for home; husband not present for session this am; Unsure if he realizes how much assist pt will need ---given her pain and rapid fatigue--combined with TDWB she is a significant fall risk  Follow Up Recommendations  Home health PT;Supervision/Assistance - 24 hour (pt declines SNF)     Equipment Recommendations  3in1 (PT);Wheelchair (measurements PT)    Recommendations for Other Services       Precautions / Restrictions Precautions Precautions: Fall;Posterior Hip Restrictions Weight Bearing Restrictions: Yes LLE Weight Bearing: Touchdown weight bearing Other Position/Activity Restrictions: now changed to TDWB     Mobility  Bed Mobility Overal bed mobility: Needs Assistance Bed Mobility: Supine to Sit     Supine to sit: Min assist     General bed mobility comments: assist with scooting, LLE, incr time, effortful,painful transition to sitting  Transfers Overall transfer level: Needs assistance Equipment used: Rolling walker (2 wheeled) Transfers: Sit to/from Stand Sit to Stand: Min assist         General transfer comment: assist to rise and stabilize, extra time needed, cues for LLE position and hand placement  Ambulation/Gait Ambulation/Gait assistance: Min assist;+2 safety/equipment Ambulation Distance (Feet): 11 Feet Assistive device: Rolling walker (2 wheeled) Gait Pattern/deviations: Step-to pattern Gait velocity: decr   General Gait Details: cues for sequence, TDWB; fatigues very rapidly, rib pain significantly incr with  activity   Stairs            Wheelchair Mobility    Modified Rankin (Stroke Patients Only)       Balance   Sitting-balance support: Single extremity supported;Feet supported Sitting balance-Leahy Scale: Fair       Standing balance-Leahy Scale: Poor Standing balance comment: reliant on UE support and external assist                             Cognition Arousal/Alertness: Awake/alert Behavior During Therapy: WFL for tasks assessed/performed Overall Cognitive Status: Within Functional Limits for tasks assessed                                        Exercises      General Comments        Pertinent Vitals/Pain Pain Assessment: Faces Faces Pain Scale: Hurts worst Pain Location: L hip and right ribs Pain Descriptors / Indicators: Sore;Guarding;Aching Pain Intervention(s): Limited activity within patient's tolerance;Monitored during session;Premedicated before session;Repositioned;Ice applied    Home Living                      Prior Function            PT Goals (current goals can now be found in the care plan section) Acute Rehab PT Goals Patient Stated Goal: home with husband to care for me PT Goal Formulation: With patient Time For Goal Achievement: 02/22/17 Potential to Achieve Goals: Good Progress towards PT goals: Progressing toward goals (slowly)    Frequency  Min 5X/week      PT Plan Current plan remains appropriate    Co-evaluation              AM-PAC PT "6 Clicks" Daily Activity  Outcome Measure  Difficulty turning over in bed (including adjusting bedclothes, sheets and blankets)?: Total Difficulty moving from lying on back to sitting on the side of the bed? : Total Difficulty sitting down on and standing up from a chair with arms (e.g., wheelchair, bedside commode, etc,.)?: Total Help needed moving to and from a bed to chair (including a wheelchair)?: Total Help needed walking in hospital  room?: A Lot Help needed climbing 3-5 steps with a railing? : Total 6 Click Score: 7    End of Session Equipment Utilized During Treatment: Gait belt Activity Tolerance: Patient limited by pain;Patient limited by fatigue Patient left: in chair;with call bell/phone within reach;with chair alarm set Nurse Communication: Mobility status;Patient requests pain meds PT Visit Diagnosis: Difficulty in walking, not elsewhere classified (R26.2);Pain Pain - Right/Left: Left Pain - part of body: Hip     Time: 7262-0355 PT Time Calculation (min) (ACUTE ONLY): 20 min  Charges:  $Gait Training: 8-22 mins                    G CodesKenyon Ana, PT Pager: [phone] 02/17/2017    Kenyon Ana 02/17/2017, 11:16 AM

## 2017-02-18 LAB — HEPATITIS PANEL, ACUTE
HCV Ab: <0.1 s/co ratio (ref 0.0–0.9)
HEP B C IGM: NEGATIVE
HEP B S AG: NEGATIVE
Hep A IgM: NEGATIVE

## 2017-03-08 ENCOUNTER — Emergency Department (HOSPITAL_COMMUNITY): Payer: Commercial Managed Care - PPO

## 2017-03-08 ENCOUNTER — Emergency Department (HOSPITAL_COMMUNITY)
Admission: EM | Admit: 2017-03-08 | Discharge: 2017-03-08 | Disposition: A | Discharge status: Home / Self Care | Payer: Commercial Managed Care - PPO | Attending: Emergency Medicine | Admitting: Emergency Medicine

## 2017-03-08 DIAGNOSIS — Y929 Unspecified place or not applicable: Secondary | ICD-10-CM | POA: Diagnosis not present

## 2017-03-08 DIAGNOSIS — Y999 Unspecified external cause status: Secondary | ICD-10-CM | POA: Insufficient documentation

## 2017-03-08 DIAGNOSIS — Z79899 Other long term (current) drug therapy: Secondary | ICD-10-CM | POA: Diagnosis not present

## 2017-03-08 DIAGNOSIS — Z96643 Presence of artificial hip joint, bilateral: Secondary | ICD-10-CM | POA: Diagnosis not present

## 2017-03-08 DIAGNOSIS — X501XXA Overexertion from prolonged static or awkward postures, initial encounter: Secondary | ICD-10-CM | POA: Diagnosis not present

## 2017-03-08 DIAGNOSIS — T84021A Dislocation of internal left hip prosthesis, initial encounter: Secondary | ICD-10-CM | POA: Diagnosis not present

## 2017-03-08 DIAGNOSIS — Y792 Prosthetic and other implants, materials and accessory orthopedic devices associated with adverse incidents: Secondary | ICD-10-CM | POA: Insufficient documentation

## 2017-03-08 DIAGNOSIS — Y93E8 Activity, other personal hygiene: Secondary | ICD-10-CM | POA: Insufficient documentation

## 2017-03-08 DIAGNOSIS — T84029A Dislocation of unspecified internal joint prosthesis, initial encounter: Secondary | ICD-10-CM

## 2017-03-08 DIAGNOSIS — Z96649 Presence of unspecified artificial hip joint: Secondary | ICD-10-CM

## 2017-03-08 DIAGNOSIS — S79922A Unspecified injury of left thigh, initial encounter: Secondary | ICD-10-CM | POA: Diagnosis present

## 2017-03-08 MED ORDER — PROPOFOL 10 MG/ML IV BOLUS
0.5000 mg/kg | Freq: Once | INTRAVENOUS | Status: AC
  Administered 2017-03-08: 17.7 mg via INTRAVENOUS
  Filled 2017-03-08: qty 20

## 2017-03-08 MED ORDER — MORPHINE SULFATE (PF) 2 MG/ML IV SOLN
4.0000 mg | Freq: Once | INTRAVENOUS | Status: AC
  Administered 2017-03-08: 4 mg via INTRAVENOUS
  Filled 2017-03-08: qty 2

## 2017-03-08 MED ORDER — PROPOFOL 10 MG/ML IV BOLUS
INTRAVENOUS | Status: DC | PRN
  Administered 2017-03-08: 20 mg via INTRAVENOUS
  Administered 2017-03-08 (×2): 40 mg via INTRAVENOUS

## 2017-03-08 MED ORDER — HYDROCODONE-ACETAMINOPHEN 5-325 MG PO TABS: 1.0000 | ORAL_TABLET | Freq: Four times a day (QID) | ORAL | 0 refills | Status: DC | PRN

## 2017-03-08 MED ORDER — PROPOFOL 10 MG/ML IV BOLUS
0.5000 mg/kg | Freq: Once | INTRAVENOUS | Status: DC
  Filled 2017-03-08: qty 20

## 2017-03-08 MED ORDER — SODIUM CHLORIDE 0.9 % IV SOLN
Freq: Once | INTRAVENOUS | Status: AC
  Administered 2017-03-08: 11:00:00 via INTRAVENOUS

## 2017-03-08 NOTE — Consult Note (Signed)
Reason for Consult: left hip pain Referring Physician: Dr. Harrie Jeans Samantha Atkins is an 66 y.o. female.  HPI:  66 y/o female with PMH of left hip periprosthetic fracture recently treated with revision of the femoral component and cerclage wiring y Dr. Alvan Dame.  She was bending over to put lotion on her left leg earlier today when she felt a pop in her hip and was unable to bear weight.  She denies any fall or LOC.  She c/o aching pain in the left hip that is worse with motion and better with rest.  She denies any recent changes to her health.  Dr. Stark Jock attempted reduction under propofol sedation an hour ago.  This was unsuccessful.  Past Medical History:  Diagnosis Date  . Anxiety   . Arthritis    hands  . Depression   . Fracture of humerus, proximal, left, closed 08/07/2015  . Fracture, humerus closed 07-31-15 fell   left  . GERD (gastroesophageal reflux disease)   . Headache   . Insomnia   . Migraines   . Osteoporosis     Past Surgical History:  Procedure Laterality Date  . ABDOMINAL HYSTERECTOMY    . FEMUR FRACTURE SURGERY Left    fell, redo hip replacement  . JOINT REPLACEMENT Bilateral    hip  . ORIF DISTAL RADIUS FRACTURE Left 03-2015  . ORIF HUMERUS FRACTURE Left 08/07/2015   Procedure: LEFT OPEN REDUCTION INTERNAL FIXATION (ORIF) PROXIMAL HUMERUS FRACTURE;  Surgeon: Marchia Bond, MD;  Location: [facility];  Service: Orthopedics;  Laterality: Left;  . TONSILLECTOMY    . TOTAL HIP REVISION Left 02/14/2017   Procedure: LEFT ORIF PERIPROSTHETIC FEMORAL FRACTURE AND REVISION HIP ARTHROPLASTY;  Surgeon: Paralee Cancel, MD;  Location: [facility];  Service: Orthopedics;  Laterality: Left;    Family History  Problem Relation Age of Onset  . Hip fracture Mother   . Heart Problems Father     Social History:  reports that she has never smoked. She has never used smokeless tobacco. She reports that she does not drink alcohol or use drugs.  Allergies:  Allergies  Allergen  Reactions  . Dust Mite Extract Cough    Medications: I have reviewed the patient's current medications.  No results found for this or any previous visit (from the past 48 hour(s)).  Dg Knee Complete 4 Views Left  Result Date: 03/08/2017 CLINICAL DATA:  Twisting injury this morning. EXAM: LEFT KNEE - COMPLETE 4+ VIEW COMPARISON:  Left femur hip radiographs-earlier same day FINDINGS: Osteopenia. No fracture or dislocation. Left knee joint spaces are preserved. No evidence of chondrocalcinosis. No joint effusion. The distal tip of known left hip replacement is without obvious abnormality. Regional soft tissues appear normal. No radiopaque foreign body. IMPRESSION: Unremarkable radiographs of the left knee. Electronically Signed   By: Sandi Mariscal M.D.   On: 03/08/2017 09:42   Dg Hip Unilat W Or Wo Pelvis 2-3 Views Left  Result Date: 03/08/2017 CLINICAL DATA:  Twisting injury this morning, now with left hip pain and foreshortening EXAM: DG HIP (WITH OR WITHOUT PELVIS) 2-3V LEFT COMPARISON:  Left femur radiographs-earlier same day FINDINGS: Posterosuperior dislocation of left redo total hip replacement. Old fracture involving the left lesser trochanter. No acute fracture. Dystrophic calcifications are noted about the superior aspect of the femoral component of the hip replacement, unchanged. Otherwise, there is no evidence of hardware failure or loosening including the superior femoral sideplate and cerclage wire fixation. Limited visualization of the pelvis  and contralateral right hip demonstrates the sequela of redo right total hip replacement without evidence of complication. Several phleboliths overlie the lower pelvis bilaterally. Regional soft tissues appear otherwise normal. No radiopaque foreign body. IMPRESSION: Posterosuperior dislocation of left redo total hip replacement. Electronically Signed   By: Sandi Mariscal M.D.   On: 03/08/2017 09:44   Dg Femur Min 2 Views Left  Result Date:  03/08/2017 CLINICAL DATA:  Twisted while reaching this morning with leg pain and foreshortening. EXAM: LEFT FEMUR 2 VIEWS COMPARISON:  02/14/2017; 02/21/2017 FINDINGS: Superior posterior dislocation of left total hip redo left total hip replacement. Re- demonstrated displaced left lesser trochanteric fracture. No definite acute fracture. Otherwise, there is no evidence of hardware failure loosening. Dystrophic calcifications about the superior aspect of the femoral prosthesis are unchanged. No radiopaque foreign body. IMPRESSION: Superior posterior dislocation of redo left hip prosthesis. Electronically Signed   By: Sandi Mariscal M.D.   On: 03/08/2017 09:40    ROS:  No recent f/cn/v/w tloss PE:  Blood pressure (!) 91/57, pulse 81, resp. rate 13, height 5\' 2"  (1.575 m), weight 35.4 kg (78 lb), SpO2 100 %. Thin cachectic female in nad.  A and O x 4.  Mood and affect normal.  EOMI.  Resp unlabored.  L LE shortened and externally rotated.  1+ dp pulses.  Feels LT in foot normally.  5/5 strength in PF and DF of the ankel and toes.  No lymphadenpathy.   Assessment/Plan: L hip dislocation - I explained the nature of the condition to the patient in her detail and discussed the plan with Dr. Stark Jock.  He has agreed to sedate her again for another attempt at reduction.  The pt understands the plan and agrees.  After informed consent and adequate sedation, closed reduction was performed with flexion, IR and traction.  She tolerated the procedure well, and there were no evident complications.  Post reduction films show a concentric reduction.  She can bear weight on her L LE in a knee immobilizer.  She should f/u with Dr. Alvan Dame in the next couple of weeks.  Samantha Atkins, Samantha Atkins 03/08/2017, 1:03 PM

## 2017-03-08 NOTE — Discharge Instructions (Signed)
Wear knee immobilizer as applied.  Hydrocodone as prescribed as needed for pain.  You are to follow-up with Dr. Alvan Dame in 2 weeks. Please call his office this week to make these arrangements.  Return to the emergency department in the meantime if you experience any new or additional problems.

## 2017-03-08 NOTE — ED Triage Notes (Signed)
Per EMS, pt is coming from home. Pt is 3 weeks post-op left hip replacement repair and is currently complaining of knee pain. Pt was trying to put lotion on and rotated her leg when the pain began. EMS reports that pt left foot is shortened and rotated. Pt received 250 mcg fentanyl prior to arrival.

## 2017-03-08 NOTE — ED Notes (Signed)
Conscious Sedation Procedure:  11:22 MD at bedside           Ortho tech at bedside           EMT at bedside           RN at bedside           RT at bedside   1125 20 mg propofol administered 1126 20 mg propofol administered 1127 20 mg propofol administered 1129 20 mg propofol administered  1131 20 mg propofol administered 1133 20 mg propofol administered 1136 20 mg propofol administered  1140 20 mg propofol administered  1141 20 mg propofol administered  1148 Conscious sedation procedure ended.

## 2017-03-08 NOTE — ED Provider Notes (Signed)
[place] DEPT Provider Note   CSN: [id number] Arrival date & time: 03/08/17  4132     History   Chief Complaint No chief complaint on file.   HPI Samantha Atkins is a 66 y.o. female.  Patient is a 66 year old female with past medical history of osteoporosis with recent femur fracture. This was repaired by Dr. Alvan Dame approximately 3 weeks ago. She presents today after experiencing severe pain in her left thigh while applying lotion to her leg. She was brought here by EMS for evaluation of this. She denies any specific fall, other injury, or other complaint. Her pain is exacerbated with movement or change in position. It is alleviated by remaining still.   The history is provided by the patient.    Past Medical History:  Diagnosis Date  . Anxiety   . Arthritis    hands  . Depression   . Fracture of humerus, proximal, left, closed 08/07/2015  . Fracture, humerus closed 07-31-15 fell   left  . GERD (gastroesophageal reflux disease)   . Headache   . Insomnia   . Migraines   . Osteoporosis     Patient Active Problem List   Diagnosis Date Noted  . Protein-calorie malnutrition, severe 02/15/2017  . Hip fracture ([place]) 02/12/2017  . Osteoporosis   . Migraines   . Depression   . Fracture of humerus, proximal, left, closed 08/07/2015    Past Surgical History:  Procedure Laterality Date  . ABDOMINAL HYSTERECTOMY    . FEMUR FRACTURE SURGERY Left    fell, redo hip replacement  . JOINT REPLACEMENT Bilateral    hip  . ORIF DISTAL RADIUS FRACTURE Left 03-2015  . ORIF HUMERUS FRACTURE Left 08/07/2015   Procedure: LEFT OPEN REDUCTION INTERNAL FIXATION (ORIF) PROXIMAL HUMERUS FRACTURE;  Surgeon: Marchia Bond, MD;  Location: [facility];  Service: Orthopedics;  Laterality: Left;  . TONSILLECTOMY    . TOTAL HIP REVISION Left 02/14/2017   Procedure: LEFT ORIF PERIPROSTHETIC FEMORAL FRACTURE AND REVISION HIP ARTHROPLASTY;  Surgeon: Paralee Cancel, MD;  Location: [facility];   Service: Orthopedics;  Laterality: Left;    OB History    Gravida Para Term Preterm AB Living   0 0 0 0 0     SAB TAB Ectopic Multiple Live Births   0 0 0           Home Medications    Prior to Admission medications   Medication Sig Start Date End Date Taking? Authorizing Provider  butalbital-acetaminophen-caffeine (FIORICET, ESGIC) 50-325-40 MG tablet Take 1 tablet by mouth every 6 (six) hours as needed for migraine.     [provider]  cholecalciferol (VITAMIN D) 1000 units tablet Take 1,000 Units by mouth daily.    [provider]  docusate sodium (COLACE) 100 MG capsule Take 1 capsule (100 mg total) by mouth 2 (two) times daily. 02/16/17   Danae Orleans, PA-C  estradiol (ESTRACE) 1 MG tablet Take 1 mg by mouth daily.    [provider]  feeding supplement, ENSURE ENLIVE, (ENSURE ENLIVE) LIQD Take 237 mLs by mouth 2 (two) times daily between meals. 02/17/17   Florencia Reasons, MD  ferrous sulfate 325 (65 FE) MG EC tablet Take 1 tablet (325 mg total) by mouth every other day. 02/17/17 02/17/18  Florencia Reasons, MD  HYDROcodone-acetaminophen (NORCO/VICODIN) 5-325 MG tablet Take 1-2 tablets by mouth every 4 (four) hours as needed for moderate pain. 02/16/17   Danae Orleans, PA-C  levETIRAcetam (KEPPRA) 500 MG tablet Take  1 tablet (500 mg total) by mouth 2 (two) times daily. 02/17/17   Florencia Reasons, MD  lidocaine (LIDODERM) 5 % Place 1 patch onto the skin daily. Remove & Discard patch within 12 hours or as directed by MD 02/18/17   Florencia Reasons, MD  Magnesium Oxide 400 (240 Mg) MG TABS Take 1 tablet (400 mg total) by mouth daily. 02/17/17   Florencia Reasons, MD  methocarbamol (ROBAXIN) 500 MG tablet Take 1 tablet (500 mg total) by mouth every 6 (six) hours as needed for muscle spasms. 02/16/17   Danae Orleans, PA-C  Multiple Vitamin (MULTIVITAMIN WITH MINERALS) TABS tablet Take 1 tablet by mouth daily.    [provider]  polyethylene glycol (MIRALAX / GLYCOLAX) packet Take 17 g by  mouth daily as needed for mild constipation. 02/16/17   Danae Orleans, PA-C  potassium chloride SA (K-DUR,KLOR-CON) 20 MEQ tablet Take 20 mEq by mouth daily.    [provider]  senna-docusate (SENOKOT-S) 8.6-50 MG tablet Take 1 tablet by mouth at bedtime. 02/17/17   Florencia Reasons, MD  sertraline (ZOLOFT) 100 MG tablet Take 100 mg by mouth daily.    [provider]    Family History Family History  Problem Relation Age of Onset  . Hip fracture Mother   . Heart Problems Father     Social History Social History  Substance Use Topics  . Smoking status: Never Smoker  . Smokeless tobacco: Never Used  . Alcohol use No     Allergies   Dust mite extract   Review of Systems Review of Systems  All other systems reviewed and are negative.    Physical Exam Updated Vital Signs There were no vitals taken for this visit.  Physical Exam  Constitutional: She is oriented to person, place, and time. No distress.  Patient is a cachectic appearing 66 year old female. She is in no distress.  HENT:  Head: Normocephalic and atraumatic.  Neck: Normal range of motion. Neck supple.  Cardiovascular: Normal rate and regular rhythm.  Exam reveals no gallop and no friction rub.   No murmur heard. Pulmonary/Chest: Effort normal and breath sounds normal. No respiratory distress. She has no wheezes.  Abdominal: Soft. Bowel sounds are normal. She exhibits no distension. There is no tenderness.  Musculoskeletal: Normal range of motion.  The left leg appears shortened and internally rotated when compared to the right. The knee appears grossly normal. The surgical incision to the left thigh appears to be healing well. There is some surrounding ecchymosis which appears old. DP pulses are palpable. Motor and sensation is intact to the entire foot.  Neurological: She is alert and oriented to person, place, and time.  Skin: Skin is warm and dry. She is not diaphoretic.  Nursing note and vitals  reviewed.    ED Treatments / Results  Labs (all labs ordered are listed, but only abnormal results are displayed) Labs Reviewed - No data to display  EKG  EKG Interpretation None       Radiology No results found.  Procedures Reduction of dislocation Date/Time: 03/08/2017 2:40 PM Performed by: Veryl Speak Authorized by: Veryl Speak  Consent: Verbal consent obtained. Written consent obtained. Risks and benefits: risks, benefits and alternatives were discussed Patient understanding: patient states understanding of the procedure being performed Patient consent: the patient's understanding of the procedure matches consent given Procedure consent: procedure consent matches procedure scheduled Relevant documents: relevant documents present and verified Test results: test results available and properly labeled Site marked: the  operative site was marked Imaging studies: imaging studies available Required items: required blood products, implants, devices, and special equipment available Patient identity confirmed: verbally with patient Time out: Immediately prior to procedure a "time out" was called to verify the correct patient, procedure, equipment, support staff and site/side marked as required. Preparation: Patient was prepped and draped in the usual sterile fashion. Local anesthesia used: no  Anesthesia: Local anesthesia used: no  Sedation: Patient sedated: yes Sedation type: moderate (conscious) sedation Sedatives: propofol Sedation start date/time: 03/08/2017 12:50 PM Sedation end date/time: 03/08/2017 1:10 PM Vitals: Vital signs were monitored during sedation. Patient tolerance: Patient tolerated the procedure well with no immediate complications  .Sedation Date/Time: 03/08/2017 2:45 PM Performed by: Veryl Speak Authorized by: Veryl Speak   Consent:    Consent obtained:  Written   Consent given by:  Patient   Risks discussed:  Allergic reaction, inadequate  sedation and prolonged sedation necessitating reversal Indications:    Procedure performed:  Dislocation reduction   Procedure necessitating sedation performed by:  Physician performing sedation   Intended level of sedation:  Moderate (conscious sedation) Pre-sedation assessment:    ASA classification: class 2 - patient with mild systemic disease     Neck mobility: normal     Mouth opening:  3 or more finger widths   Mallampati score:  II - soft palate, uvula, fauces visible   Pre-sedation assessments completed and reviewed: airway patency and cardiovascular function   Immediate pre-procedure details:    Reviewed: vital signs   Procedure details (see MAR for exact dosages):    Sedation start time:  03/08/2017 11:25 AM   Preoxygenation:  Nasal cannula   Sedation:  Propofol   Intra-procedure monitoring:  Blood pressure monitoring, cardiac monitor, continuous capnometry, continuous pulse oximetry, frequent LOC assessments and frequent vital sign checks   Intra-procedure events: none     Intra-procedure management:  Fluid bolus   Sedation end time:  03/08/2017 11:45 AM   Total sedation time (minutes):  20 Post-procedure details:    Attendance: Constant attendance by certified staff until patient recovered     Recovery: Patient returned to pre-procedure baseline     Post-sedation assessments completed and reviewed: airway patency and cardiovascular function     Patient tolerance:  Tolerated well, no immediate complications Comments:     Unsuccessful reduction of hip.   (including critical care time)  Medications Ordered in ED Medications - No data to display   Initial Impression / Assessment and Plan / ED Course  I have reviewed the triage vital signs and the nursing notes.  Pertinent labs & imaging results that were available during my care of the patient were reviewed by me and considered in my medical decision making (see chart for details).  Patient with history of recent revision  of left total hip replacement presenting with left hip pain and leg shortening after stepping awkwardly at home. X-rays confirm a dislocated left hip prosthesis. This was discussed with Dr. Doran Durand from orthopedics who in turn discussed the care with the patient's surgeon, Dr. Alvan Dame. It was his recommendation that we make an attempt at reduction. The patient was given conscious sedation, however I was unable to reduce the hip.  I again discussed with Dr. Doran Durand who came to the emergency department and was able to perform a successful reduction under a repeat conscious sedation. The patient tolerated this well and appears appropriate for discharge.  She was placed in a knee immobilizer, will be given pain medication, and  is to follow-up with Dr. Alvan Dame in approximately 2 weeks.  Final Clinical Impressions(s) / ED Diagnoses   Final diagnoses:  None    New Prescriptions New Prescriptions   No medications on file     Veryl Speak, MD 03/08/17 [phone]

## 2017-03-08 NOTE — ED Notes (Signed)
Bed: WA20 Expected date:  Expected time:  Means of arrival:  Comments: EMS/knee fx

## 2017-03-22 ENCOUNTER — Other Ambulatory Visit: Payer: Self-pay | Admitting: Family Medicine

## 2017-03-22 DIAGNOSIS — K769 Liver disease, unspecified: Secondary | ICD-10-CM

## 2017-04-07 ENCOUNTER — Ambulatory Visit
Admission: RE | Admit: 2017-04-07 | Discharge: 2017-04-07 | Disposition: A | Discharge status: Home / Self Care | Payer: Commercial Managed Care - PPO | Source: Ambulatory Visit | Attending: Family Medicine | Admitting: Family Medicine

## 2017-04-07 ENCOUNTER — Other Ambulatory Visit: Payer: Self-pay

## 2017-04-07 DIAGNOSIS — K769 Liver disease, unspecified: Secondary | ICD-10-CM

## 2017-04-07 MED ORDER — GADOBENATE DIMEGLUMINE 529 MG/ML IV SOLN
7.0000 mL | Freq: Once | INTRAVENOUS | Status: AC | PRN
  Administered 2017-04-07: 7 mL via INTRAVENOUS

## 2017-05-01 ENCOUNTER — Ambulatory Visit: Payer: Commercial Managed Care - PPO | Admitting: Neurology

## 2018-01-28 ENCOUNTER — Emergency Department (HOSPITAL_COMMUNITY): Payer: Commercial Managed Care - PPO

## 2018-01-28 ENCOUNTER — Other Ambulatory Visit: Payer: Self-pay

## 2018-01-28 ENCOUNTER — Inpatient Hospital Stay (HOSPITAL_COMMUNITY)
Admission: EM | Admit: 2018-01-28 | Discharge: 2018-02-14 | DRG: 100 | Disposition: A | Discharge status: Home Health | Payer: Commercial Managed Care - PPO | Attending: Internal Medicine | Admitting: Internal Medicine

## 2018-01-28 DIAGNOSIS — E872 Acidosis: Secondary | ICD-10-CM | POA: Diagnosis present

## 2018-01-28 DIAGNOSIS — Z781 Physical restraint status: Secondary | ICD-10-CM

## 2018-01-28 DIAGNOSIS — F419 Anxiety disorder, unspecified: Secondary | ICD-10-CM | POA: Diagnosis present

## 2018-01-28 DIAGNOSIS — G8384 Todd's paralysis (postepileptic): Secondary | ICD-10-CM | POA: Diagnosis present

## 2018-01-28 DIAGNOSIS — R945 Abnormal results of liver function studies: Secondary | ICD-10-CM | POA: Diagnosis present

## 2018-01-28 DIAGNOSIS — E43 Unspecified severe protein-calorie malnutrition: Secondary | ICD-10-CM | POA: Diagnosis present

## 2018-01-28 DIAGNOSIS — M810 Age-related osteoporosis without current pathological fracture: Secondary | ICD-10-CM | POA: Diagnosis present

## 2018-01-28 DIAGNOSIS — I700 Atherosclerosis of aorta: Secondary | ICD-10-CM | POA: Diagnosis present

## 2018-01-28 DIAGNOSIS — E876 Hypokalemia: Secondary | ICD-10-CM | POA: Diagnosis present

## 2018-01-28 DIAGNOSIS — D61818 Other pancytopenia: Secondary | ICD-10-CM | POA: Diagnosis not present

## 2018-01-28 DIAGNOSIS — M4856XA Collapsed vertebra, not elsewhere classified, lumbar region, initial encounter for fracture: Secondary | ICD-10-CM | POA: Diagnosis present

## 2018-01-28 DIAGNOSIS — R569 Unspecified convulsions: Secondary | ICD-10-CM

## 2018-01-28 DIAGNOSIS — M21379 Foot drop, unspecified foot: Secondary | ICD-10-CM | POA: Diagnosis not present

## 2018-01-28 DIAGNOSIS — Z96643 Presence of artificial hip joint, bilateral: Secondary | ICD-10-CM | POA: Diagnosis present

## 2018-01-28 DIAGNOSIS — G40911 Epilepsy, unspecified, intractable, with status epilepticus: Secondary | ICD-10-CM | POA: Diagnosis not present

## 2018-01-28 DIAGNOSIS — J9811 Atelectasis: Secondary | ICD-10-CM | POA: Diagnosis not present

## 2018-01-28 DIAGNOSIS — Z79899 Other long term (current) drug therapy: Secondary | ICD-10-CM

## 2018-01-28 DIAGNOSIS — R451 Restlessness and agitation: Secondary | ICD-10-CM | POA: Diagnosis present

## 2018-01-28 DIAGNOSIS — R509 Fever, unspecified: Secondary | ICD-10-CM

## 2018-01-28 DIAGNOSIS — I959 Hypotension, unspecified: Secondary | ICD-10-CM

## 2018-01-28 DIAGNOSIS — K219 Gastro-esophageal reflux disease without esophagitis: Secondary | ICD-10-CM | POA: Diagnosis present

## 2018-01-28 DIAGNOSIS — Z9109 Other allergy status, other than to drugs and biological substances: Secondary | ICD-10-CM

## 2018-01-28 DIAGNOSIS — Z90710 Acquired absence of both cervix and uterus: Secondary | ICD-10-CM

## 2018-01-28 DIAGNOSIS — D62 Acute posthemorrhagic anemia: Secondary | ICD-10-CM

## 2018-01-28 DIAGNOSIS — J90 Pleural effusion, not elsewhere classified: Secondary | ICD-10-CM | POA: Diagnosis not present

## 2018-01-28 DIAGNOSIS — M1990 Unspecified osteoarthritis, unspecified site: Secondary | ICD-10-CM | POA: Diagnosis present

## 2018-01-28 DIAGNOSIS — Z978 Presence of other specified devices: Secondary | ICD-10-CM

## 2018-01-28 DIAGNOSIS — I4581 Long QT syndrome: Secondary | ICD-10-CM | POA: Diagnosis not present

## 2018-01-28 DIAGNOSIS — X58XXXA Exposure to other specified factors, initial encounter: Secondary | ICD-10-CM | POA: Diagnosis present

## 2018-01-28 DIAGNOSIS — J9601 Acute respiratory failure with hypoxia: Secondary | ICD-10-CM

## 2018-01-28 DIAGNOSIS — E8809 Other disorders of plasma-protein metabolism, not elsewhere classified: Secondary | ICD-10-CM | POA: Diagnosis not present

## 2018-01-28 DIAGNOSIS — R402432 Glasgow coma scale score 3-8, at arrival to emergency department: Secondary | ICD-10-CM | POA: Diagnosis present

## 2018-01-28 DIAGNOSIS — G40901 Epilepsy, unspecified, not intractable, with status epilepticus: Secondary | ICD-10-CM | POA: Diagnosis not present

## 2018-01-28 DIAGNOSIS — D1803 Hemangioma of intra-abdominal structures: Secondary | ICD-10-CM | POA: Diagnosis present

## 2018-01-28 DIAGNOSIS — R739 Hyperglycemia, unspecified: Secondary | ICD-10-CM | POA: Diagnosis present

## 2018-01-28 DIAGNOSIS — F1910 Other psychoactive substance abuse, uncomplicated: Secondary | ICD-10-CM | POA: Diagnosis present

## 2018-01-28 DIAGNOSIS — D509 Iron deficiency anemia, unspecified: Secondary | ICD-10-CM | POA: Diagnosis present

## 2018-01-28 DIAGNOSIS — Z79891 Long term (current) use of opiate analgesic: Secondary | ICD-10-CM

## 2018-01-28 DIAGNOSIS — D7589 Other specified diseases of blood and blood-forming organs: Secondary | ICD-10-CM | POA: Diagnosis present

## 2018-01-28 DIAGNOSIS — G4700 Insomnia, unspecified: Secondary | ICD-10-CM | POA: Diagnosis present

## 2018-01-28 DIAGNOSIS — Z9181 History of falling: Secondary | ICD-10-CM

## 2018-01-28 DIAGNOSIS — I5041 Acute combined systolic (congestive) and diastolic (congestive) heart failure: Secondary | ICD-10-CM

## 2018-01-28 DIAGNOSIS — Z452 Encounter for adjustment and management of vascular access device: Secondary | ICD-10-CM

## 2018-01-28 DIAGNOSIS — G9349 Other encephalopathy: Secondary | ICD-10-CM | POA: Diagnosis present

## 2018-01-28 DIAGNOSIS — R261 Paralytic gait: Secondary | ICD-10-CM | POA: Diagnosis present

## 2018-01-28 DIAGNOSIS — F329 Major depressive disorder, single episode, unspecified: Secondary | ICD-10-CM | POA: Diagnosis present

## 2018-01-28 DIAGNOSIS — R188 Other ascites: Secondary | ICD-10-CM | POA: Diagnosis present

## 2018-01-28 DIAGNOSIS — H5500 Unspecified nystagmus: Secondary | ICD-10-CM | POA: Diagnosis not present

## 2018-01-28 DIAGNOSIS — D539 Nutritional anemia, unspecified: Secondary | ICD-10-CM | POA: Diagnosis present

## 2018-01-28 DIAGNOSIS — J9690 Respiratory failure, unspecified, unspecified whether with hypoxia or hypercapnia: Secondary | ICD-10-CM

## 2018-01-28 HISTORY — DX: Malignant (primary) neoplasm, unspecified: C80.1

## 2018-01-28 HISTORY — DX: Unspecified convulsions: R56.9

## 2018-01-28 HISTORY — DX: Personal history of (healed) traumatic fracture: Z87.81

## 2018-01-28 LAB — CBC
HEMATOCRIT: 42 % (ref 36.0–46.0)
Hemoglobin: 14.1 g/dL (ref 12.0–15.0)
MCH: 34.4 pg — ABNORMAL HIGH (ref 26.0–34.0)
MCHC: 33.6 g/dL (ref 30.0–36.0)
MCV: 102.4 fL — ABNORMAL HIGH (ref 78.0–100.0)
PLATELETS: 312 10*3/uL (ref 150–400)
RBC: 4.1 MIL/uL (ref 3.87–5.11)
RDW: 12 % (ref 11.5–15.5)
WBC: 14.3 10*3/uL — AB (ref 4.0–10.5)

## 2018-01-28 LAB — PROTIME-INR
INR: 1.06
Prothrombin Time: 13.7 seconds (ref 11.4–15.2)

## 2018-01-28 LAB — I-STAT CG4 LACTIC ACID, ED: LACTIC ACID, VENOUS: 8.89 mmol/L — AB (ref 0.5–1.9)

## 2018-01-28 MED ORDER — LEVETIRACETAM IN NACL 1000 MG/100ML IV SOLN
1000.0000 mg | Freq: Once | INTRAVENOUS | Status: AC
  Administered 2018-01-28: 1000 mg via INTRAVENOUS
  Filled 2018-01-28: qty 100

## 2018-01-28 MED ORDER — FENTANYL CITRATE (PF) 100 MCG/2ML IJ SOLN
50.0000 ug | Freq: Once | INTRAMUSCULAR | Status: AC
  Administered 2018-01-28: 50 ug via INTRAVENOUS

## 2018-01-28 MED ORDER — FENTANYL 2500MCG IN NS 250ML (10MCG/ML) PREMIX INFUSION
0.0000 ug/h | INTRAVENOUS | Status: DC
Start: 2018-01-28 — End: 2018-02-05
  Administered 2018-01-31: 25 ug/h via INTRAVENOUS
  Administered 2018-02-01: 150 ug/h via INTRAVENOUS
  Filled 2018-01-28 (×3): qty 250

## 2018-01-28 MED ORDER — SUCCINYLCHOLINE CHLORIDE 20 MG/ML IJ SOLN
INTRAMUSCULAR | Status: AC | PRN
  Administered 2018-01-28: 100 mg via INTRAVENOUS

## 2018-01-28 MED ORDER — ETOMIDATE 2 MG/ML IV SOLN
INTRAVENOUS | Status: AC | PRN
  Administered 2018-01-28: 20 mg via INTRAVENOUS

## 2018-01-28 NOTE — ED Triage Notes (Signed)
Pt BIB [facility], husband found pt seizing at 2115, on EMS arrival pt was found to have generalized seizing. Given 5mg  versed IV PTA, NPA x 2 placed and pt arrived on NRB. Eyes deviated right and eye lids fluttering on arrival. Pt non-responsive, not following commands.

## 2018-01-29 ENCOUNTER — Inpatient Hospital Stay (HOSPITAL_COMMUNITY): Payer: Commercial Managed Care - PPO

## 2018-01-29 DIAGNOSIS — J90 Pleural effusion, not elsewhere classified: Secondary | ICD-10-CM | POA: Diagnosis not present

## 2018-01-29 DIAGNOSIS — D539 Nutritional anemia, unspecified: Secondary | ICD-10-CM | POA: Diagnosis present

## 2018-01-29 DIAGNOSIS — X58XXXA Exposure to other specified factors, initial encounter: Secondary | ICD-10-CM | POA: Diagnosis present

## 2018-01-29 DIAGNOSIS — I5030 Unspecified diastolic (congestive) heart failure: Secondary | ICD-10-CM | POA: Diagnosis not present

## 2018-01-29 DIAGNOSIS — G40901 Epilepsy, unspecified, not intractable, with status epilepticus: Secondary | ICD-10-CM

## 2018-01-29 DIAGNOSIS — G4700 Insomnia, unspecified: Secondary | ICD-10-CM | POA: Diagnosis present

## 2018-01-29 DIAGNOSIS — F1910 Other psychoactive substance abuse, uncomplicated: Secondary | ICD-10-CM | POA: Diagnosis present

## 2018-01-29 DIAGNOSIS — K219 Gastro-esophageal reflux disease without esophagitis: Secondary | ICD-10-CM | POA: Diagnosis present

## 2018-01-29 DIAGNOSIS — E43 Unspecified severe protein-calorie malnutrition: Secondary | ICD-10-CM | POA: Diagnosis present

## 2018-01-29 DIAGNOSIS — J9601 Acute respiratory failure with hypoxia: Secondary | ICD-10-CM | POA: Diagnosis present

## 2018-01-29 DIAGNOSIS — I959 Hypotension, unspecified: Secondary | ICD-10-CM

## 2018-01-29 DIAGNOSIS — G9349 Other encephalopathy: Secondary | ICD-10-CM | POA: Diagnosis present

## 2018-01-29 DIAGNOSIS — R569 Unspecified convulsions: Secondary | ICD-10-CM

## 2018-01-29 DIAGNOSIS — M810 Age-related osteoporosis without current pathological fracture: Secondary | ICD-10-CM | POA: Diagnosis present

## 2018-01-29 DIAGNOSIS — M1990 Unspecified osteoarthritis, unspecified site: Secondary | ICD-10-CM | POA: Diagnosis present

## 2018-01-29 DIAGNOSIS — M4856XA Collapsed vertebra, not elsewhere classified, lumbar region, initial encounter for fracture: Secondary | ICD-10-CM | POA: Diagnosis present

## 2018-01-29 DIAGNOSIS — D61818 Other pancytopenia: Secondary | ICD-10-CM | POA: Diagnosis not present

## 2018-01-29 DIAGNOSIS — I5041 Acute combined systolic (congestive) and diastolic (congestive) heart failure: Secondary | ICD-10-CM | POA: Diagnosis present

## 2018-01-29 DIAGNOSIS — R188 Other ascites: Secondary | ICD-10-CM | POA: Diagnosis present

## 2018-01-29 DIAGNOSIS — Z9109 Other allergy status, other than to drugs and biological substances: Secondary | ICD-10-CM | POA: Diagnosis not present

## 2018-01-29 DIAGNOSIS — F419 Anxiety disorder, unspecified: Secondary | ICD-10-CM | POA: Diagnosis present

## 2018-01-29 DIAGNOSIS — Z978 Presence of other specified devices: Secondary | ICD-10-CM | POA: Diagnosis not present

## 2018-01-29 DIAGNOSIS — D62 Acute posthemorrhagic anemia: Secondary | ICD-10-CM | POA: Diagnosis present

## 2018-01-29 DIAGNOSIS — D509 Iron deficiency anemia, unspecified: Secondary | ICD-10-CM | POA: Diagnosis present

## 2018-01-29 DIAGNOSIS — Z79899 Other long term (current) drug therapy: Secondary | ICD-10-CM | POA: Diagnosis not present

## 2018-01-29 DIAGNOSIS — E872 Acidosis: Secondary | ICD-10-CM | POA: Diagnosis present

## 2018-01-29 DIAGNOSIS — R29898 Other symptoms and signs involving the musculoskeletal system: Secondary | ICD-10-CM | POA: Diagnosis not present

## 2018-01-29 DIAGNOSIS — J9811 Atelectasis: Secondary | ICD-10-CM | POA: Diagnosis not present

## 2018-01-29 DIAGNOSIS — F329 Major depressive disorder, single episode, unspecified: Secondary | ICD-10-CM | POA: Diagnosis present

## 2018-01-29 DIAGNOSIS — G40911 Epilepsy, unspecified, intractable, with status epilepticus: Secondary | ICD-10-CM | POA: Diagnosis present

## 2018-01-29 LAB — GLUCOSE, CAPILLARY
GLUCOSE-CAPILLARY: 128 mg/dL — AB (ref 65–99)
GLUCOSE-CAPILLARY: 96 mg/dL (ref 65–99)
Glucose-Capillary: 156 mg/dL — ABNORMAL HIGH (ref 65–99)
Glucose-Capillary: 132 mg/dL — ABNORMAL HIGH (ref 65–99)
Glucose-Capillary: 101 mg/dL — ABNORMAL HIGH (ref 65–99)
Glucose-Capillary: 129 mg/dL — ABNORMAL HIGH (ref 65–99)

## 2018-01-29 LAB — URINALYSIS, ROUTINE W REFLEX MICROSCOPIC
BILIRUBIN URINE: NEGATIVE
Bacteria, UA: NONE SEEN
Bilirubin Urine: NEGATIVE
Glucose, UA: NEGATIVE mg/dL
Glucose, UA: NEGATIVE mg/dL
Ketones, ur: NEGATIVE mg/dL
Ketones, ur: 20 mg/dL — AB
LEUKOCYTES UA: NEGATIVE
Leukocytes, UA: NEGATIVE
NITRITE: NEGATIVE
Nitrite: NEGATIVE
PH: 5 (ref 5.0–8.0)
Protein, ur: 30 mg/dL — AB
Protein, ur: NEGATIVE mg/dL
SPECIFIC GRAVITY, URINE: 1.011 (ref 1.005–1.030)
SPECIFIC GRAVITY, URINE: 1.015 (ref 1.005–1.030)
pH: 5 (ref 5.0–8.0)

## 2018-01-29 LAB — POCT I-STAT 3, ART BLOOD GAS (G3+)
ACID-BASE DEFICIT: 6 mmol/L — AB (ref 0.0–2.0)
Bicarbonate: 18.1 mmol/L — ABNORMAL LOW (ref 20.0–28.0)
O2 SAT: 99 %
TCO2: 19 mmol/L — AB (ref 22–32)
pCO2 arterial: 32.6 mmHg (ref 32.0–48.0)
pH, Arterial: 7.354 (ref 7.350–7.450)
pO2, Arterial: 155 mmHg — ABNORMAL HIGH (ref 83.0–108.0)

## 2018-01-29 LAB — COMPREHENSIVE METABOLIC PANEL
ALT: 43 U/L (ref 14–54)
AST: 46 U/L — AB (ref 15–41)
Albumin: 3.8 g/dL (ref 3.5–5.0)
Alkaline Phosphatase: 93 U/L (ref 38–126)
Anion gap: 18 — ABNORMAL HIGH (ref 5–15)
BILIRUBIN TOTAL: 0.3 mg/dL (ref 0.3–1.2)
BUN: 17 mg/dL (ref 6–20)
CO2: 18 mmol/L — ABNORMAL LOW (ref 22–32)
CREATININE: 0.89 mg/dL (ref 0.44–1.00)
Calcium: 9.3 mg/dL (ref 8.9–10.3)
Chloride: 104 mmol/L (ref 101–111)
GFR calc Af Amer: >60 mL/min (ref >60)
Glucose, Bld: 229 mg/dL — ABNORMAL HIGH (ref 65–99)
POTASSIUM: 3.3 mmol/L — AB (ref 3.5–5.1)
Sodium: 140 mmol/L (ref 135–145)
TOTAL PROTEIN: 6.7 g/dL (ref 6.5–8.1)

## 2018-01-29 LAB — LACTIC ACID, PLASMA
LACTIC ACID, VENOUS: 2.5 mmol/L — AB (ref 0.5–1.9)
Lactic Acid, Venous: 3.7 mmol/L (ref 0.5–1.9)
Lactic Acid, Venous: 1.7 mmol/L (ref 0.5–1.9)

## 2018-01-29 LAB — I-STAT ARTERIAL BLOOD GAS, ED
ACID-BASE DEFICIT: 2 mmol/L (ref 0.0–2.0)
BICARBONATE: 24.9 mmol/L (ref 20.0–28.0)
O2 Saturation: 100 %
TCO2: 26 mmol/L (ref 22–32)
pCO2 arterial: 49.5 mmHg — ABNORMAL HIGH (ref 32.0–48.0)
pH, Arterial: 7.31 — ABNORMAL LOW (ref 7.350–7.450)
pO2, Arterial: 514 mmHg — ABNORMAL HIGH (ref 83.0–108.0)

## 2018-01-29 LAB — ETHANOL

## 2018-01-29 LAB — RAPID URINE DRUG SCREEN, HOSP PERFORMED
Amphetamines: NOT DETECTED
BENZODIAZEPINES: POSITIVE — AB
Cocaine: NOT DETECTED
Opiates: NOT DETECTED
Tetrahydrocannabinol: POSITIVE — AB

## 2018-01-29 LAB — CORTISOL: CORTISOL PLASMA: 28.1 ug/dL

## 2018-01-29 LAB — PROCALCITONIN
PROCALCITONIN: 0.32 ng/mL
PROCALCITONIN: 1.71 ng/mL
Procalcitonin: 2.16 ng/mL

## 2018-01-29 LAB — MRSA PCR SCREENING: MRSA BY PCR: NEGATIVE

## 2018-01-29 LAB — MAGNESIUM: MAGNESIUM: 1.9 mg/dL (ref 1.7–2.4)

## 2018-01-29 MED ORDER — MIDAZOLAM HCL 2 MG/2ML IJ SOLN
INTRAMUSCULAR | Status: AC
  Administered 2018-01-29: 3 mg via INTRAVENOUS
  Filled 2018-01-29: qty 4

## 2018-01-29 MED ORDER — NOREPINEPHRINE 4 MG/250ML-% IV SOLN
0.0000 ug/min | INTRAVENOUS | Status: DC
  Administered 2018-01-29: 15 ug/min via INTRAVENOUS
  Administered 2018-01-30: 19 ug/min via INTRAVENOUS
  Administered 2018-01-30: 20 ug/min via INTRAVENOUS
  Administered 2018-01-30: 25 ug/min via INTRAVENOUS
  Filled 2018-01-29: qty 250

## 2018-01-29 MED ORDER — PHENYLEPHRINE HCL-NACL 10-0.9 MG/250ML-% IV SOLN
0.0000 ug/min | INTRAVENOUS | Status: DC
  Administered 2018-01-29: 75 ug/min via INTRAVENOUS
  Administered 2018-01-29: 250 ug/min via INTRAVENOUS
  Administered 2018-01-29: 180 ug/min via INTRAVENOUS
  Administered 2018-01-29: 300 ug/min via INTRAVENOUS
  Administered 2018-01-29: 20 ug/min via INTRAVENOUS
  Administered 2018-01-29: 200 ug/min via INTRAVENOUS
  Administered 2018-01-29 (×3): 100 ug/min via INTRAVENOUS
  Administered 2018-01-30: 70 ug/min via INTRAVENOUS
  Administered 2018-01-30: 25 ug/min via INTRAVENOUS
  Administered 2018-01-30: 50 ug/min via INTRAVENOUS
  Filled 2018-01-29 (×16): qty 250

## 2018-01-29 MED ORDER — MIDAZOLAM HCL 2 MG/2ML IJ SOLN
3.0000 mg | Freq: Once | INTRAMUSCULAR | Status: AC
  Administered 2018-01-29: 3 mg via INTRAVENOUS

## 2018-01-29 MED ORDER — MIDAZOLAM BOLUS VIA INFUSION
10.0000 mg | INTRAVENOUS | Status: DC | PRN
  Administered 2018-01-29 (×3): 10 mg via INTRAVENOUS
  Filled 2018-01-29: qty 10

## 2018-01-29 MED ORDER — LORAZEPAM 2 MG/ML IJ SOLN
INTRAMUSCULAR | Status: AC
  Filled 2018-01-29: qty 1

## 2018-01-29 MED ORDER — CHLORHEXIDINE GLUCONATE 0.12% ORAL RINSE (MEDLINE KIT)
15.0000 mL | Freq: Two times a day (BID) | OROMUCOSAL | Status: DC
  Administered 2018-01-29 – 2018-02-09 (×23): 15 mL via OROMUCOSAL

## 2018-01-29 MED ORDER — SODIUM CHLORIDE 0.9 % IV SOLN
20.0000 mg/kg | INTRAVENOUS | Status: AC
  Administered 2018-01-29: 776 mg via INTRAVENOUS
  Filled 2018-01-29: qty 15.52

## 2018-01-29 MED ORDER — LORAZEPAM 2 MG/ML IJ SOLN
INTRAMUSCULAR | Status: AC
  Administered 2018-01-29: 2 mg
  Filled 2018-01-29: qty 1

## 2018-01-29 MED ORDER — SODIUM CHLORIDE 0.9 % IV SOLN
250.0000 mL | INTRAVENOUS | Status: DC | PRN
  Administered 2018-01-29 – 2018-02-05 (×3): 250 mL via INTRAVENOUS

## 2018-01-29 MED ORDER — SODIUM CHLORIDE 0.9 % IV SOLN
750.0000 mg | Freq: Two times a day (BID) | INTRAVENOUS | Status: DC
  Filled 2018-01-29: qty 7.5

## 2018-01-29 MED ORDER — SODIUM CHLORIDE 0.9 % IV SOLN
INTRAVENOUS | Status: DC
  Administered 2018-01-29 – 2018-02-03 (×10): via INTRAVENOUS

## 2018-01-29 MED ORDER — LORAZEPAM 2 MG/ML IJ SOLN
INTRAMUSCULAR | Status: AC
  Administered 2018-01-29: 2 mg via INTRAVENOUS
  Filled 2018-01-29: qty 1

## 2018-01-29 MED ORDER — LEVETIRACETAM IN NACL 500 MG/100ML IV SOLN: 500.0000 mg | Freq: Two times a day (BID) | INTRAVENOUS | Status: DC

## 2018-01-29 MED ORDER — POTASSIUM CHLORIDE 20 MEQ/15ML (10%) PO SOLN
40.0000 meq | Freq: Once | ORAL | Status: AC
  Administered 2018-01-29: 40 meq
  Filled 2018-01-29: qty 30

## 2018-01-29 MED ORDER — ORAL CARE MOUTH RINSE
15.0000 mL | OROMUCOSAL | Status: DC
  Administered 2018-01-29 – 2018-02-09 (×110): 15 mL via OROMUCOSAL

## 2018-01-29 MED ORDER — LEVETIRACETAM IN NACL 1000 MG/100ML IV SOLN
1000.0000 mg | Freq: Once | INTRAVENOUS | Status: AC
  Administered 2018-01-29: 1000 mg via INTRAVENOUS
  Filled 2018-01-29: qty 100

## 2018-01-29 MED ORDER — PHENYTOIN SODIUM 50 MG/ML IJ SOLN
100.0000 mg | Freq: Three times a day (TID) | INTRAMUSCULAR | Status: DC
  Administered 2018-01-29 – 2018-01-31 (×5): 100 mg via INTRAVENOUS
  Filled 2018-01-29 (×5): qty 2

## 2018-01-29 MED ORDER — SODIUM CHLORIDE 0.9 % IV SOLN
200.0000 mg | INTRAVENOUS | Status: AC
  Administered 2018-01-29: 200 mg via INTRAVENOUS
  Filled 2018-01-29: qty 20

## 2018-01-29 MED ORDER — PANTOPRAZOLE SODIUM 40 MG PO PACK
40.0000 mg | PACK | Freq: Every day | ORAL | Status: DC
  Administered 2018-01-29 – 2018-02-10 (×13): 40 mg
  Filled 2018-01-29 (×15): qty 20

## 2018-01-29 MED ORDER — HEPARIN SODIUM (PORCINE) 5000 UNIT/ML IJ SOLN
5000.0000 [IU] | Freq: Three times a day (TID) | INTRAMUSCULAR | Status: DC
  Administered 2018-01-29 – 2018-01-30 (×3): 5000 [IU] via SUBCUTANEOUS
  Filled 2018-01-29 (×3): qty 1

## 2018-01-29 MED ORDER — ACETAMINOPHEN 160 MG/5ML PO SOLN
650.0000 mg | Freq: Four times a day (QID) | ORAL | Status: DC | PRN
  Administered 2018-01-29 – 2018-02-13 (×25): 650 mg
  Filled 2018-01-29 (×24): qty 20.3

## 2018-01-29 MED ORDER — SODIUM CHLORIDE 0.9 % IV BOLUS
1000.0000 mL | Freq: Once | INTRAVENOUS | Status: AC
  Administered 2018-01-29: 1000 mL via INTRAVENOUS

## 2018-01-29 MED ORDER — SODIUM CHLORIDE 0.9 % IV SOLN
20.0000 mg/h | INTRAVENOUS | Status: DC
  Administered 2018-01-29: 10 mg/h via INTRAVENOUS
  Administered 2018-01-30 – 2018-01-31 (×2): 20 mg/h via INTRAVENOUS
  Administered 2018-01-31: 10 mg/h via INTRAVENOUS
  Filled 2018-01-29 (×4): qty 50

## 2018-01-29 MED ORDER — SODIUM CHLORIDE 0.9 % IV SOLN
10.0000 mg/h | INTRAVENOUS | Status: DC
  Administered 2018-01-29: 10 mg/h via INTRAVENOUS
  Filled 2018-01-29 (×2): qty 10

## 2018-01-29 MED ORDER — PREGABALIN 75 MG PO CAPS
200.0000 mg | ORAL_CAPSULE | Freq: Once | ORAL | Status: AC
  Administered 2018-01-30: 200 mg via ORAL
  Filled 2018-01-29: qty 1

## 2018-01-29 MED ORDER — NOREPINEPHRINE 4 MG/250ML-% IV SOLN: 0.0000 ug/min | Freq: Once | INTRAVENOUS | Status: DC

## 2018-01-29 MED ORDER — LEVETIRACETAM IN NACL 1500 MG/100ML IV SOLN
1500.0000 mg | Freq: Two times a day (BID) | INTRAVENOUS | Status: DC
  Administered 2018-01-29 – 2018-02-09 (×23): 1500 mg via INTRAVENOUS
  Filled 2018-01-29 (×25): qty 100

## 2018-01-29 MED ORDER — LORAZEPAM 2 MG/ML IJ SOLN
2.0000 mg | Freq: Once | INTRAMUSCULAR | Status: AC
  Administered 2018-01-29: 2 mg via INTRAVENOUS

## 2018-01-29 MED ORDER — LORAZEPAM 2 MG/ML IJ SOLN
1.0000 mg | Freq: Three times a day (TID) | INTRAMUSCULAR | Status: DC
  Administered 2018-01-29 – 2018-02-09 (×34): 1 mg via INTRAVENOUS
  Filled 2018-01-29 (×35): qty 1

## 2018-01-29 MED ORDER — TOPIRAMATE 100 MG PO TABS
200.0000 mg | ORAL_TABLET | Freq: Once | ORAL | Status: AC
  Administered 2018-01-29: 200 mg via ORAL
  Filled 2018-01-29: qty 2

## 2018-01-29 MED ORDER — LEVETIRACETAM IN NACL 1000 MG/100ML IV SOLN
1000.0000 mg | Freq: Two times a day (BID) | INTRAVENOUS | Status: DC
  Administered 2018-01-29: 1000 mg via INTRAVENOUS
  Filled 2018-01-29: qty 100

## 2018-01-29 MED ORDER — MIDAZOLAM BOLUS VIA INFUSION: 10.0000 mg | INTRAVENOUS | Status: DC | PRN

## 2018-01-29 MED ORDER — SODIUM CHLORIDE 0.9 % IV BOLUS
500.0000 mL | Freq: Once | INTRAVENOUS | Status: AC
  Administered 2018-01-29: 500 mL via INTRAVENOUS

## 2018-01-29 MED ORDER — NOREPINEPHRINE 4 MG/250ML-% IV SOLN
0.0000 ug/min | INTRAVENOUS | Status: DC
  Administered 2018-01-29 (×2): 15 ug/min via INTRAVENOUS
  Administered 2018-01-29: 18 ug/min via INTRAVENOUS
  Filled 2018-01-29 (×6): qty 250

## 2018-01-29 MED ORDER — INSULIN ASPART 100 UNIT/ML ~~LOC~~ SOLN
0.0000 [IU] | SUBCUTANEOUS | Status: DC
  Administered 2018-01-29: 2 [IU] via SUBCUTANEOUS
  Administered 2018-01-29: 3 [IU] via SUBCUTANEOUS
  Administered 2018-01-29 – 2018-01-30 (×2): 2 [IU] via SUBCUTANEOUS
  Administered 2018-01-30 – 2018-01-31 (×3): 3 [IU] via SUBCUTANEOUS
  Administered 2018-01-31 – 2018-02-01 (×3): 2 [IU] via SUBCUTANEOUS
  Administered 2018-02-02: 3 [IU] via SUBCUTANEOUS
  Administered 2018-02-03 (×2): 2 [IU] via SUBCUTANEOUS
  Administered 2018-02-03: 3 [IU] via SUBCUTANEOUS
  Administered 2018-02-04 – 2018-02-06 (×4): 2 [IU] via SUBCUTANEOUS
  Administered 2018-02-07: 3 [IU] via SUBCUTANEOUS
  Administered 2018-02-07: 2 [IU] via SUBCUTANEOUS

## 2018-01-29 MED ORDER — NOREPINEPHRINE 4 MG/250ML-% IV SOLN
0.0000 ug/min | Freq: Once | INTRAVENOUS | Status: AC
  Administered 2018-01-29: 2 ug/min via INTRAVENOUS
  Filled 2018-01-29: qty 250

## 2018-01-29 NOTE — Progress Notes (Signed)
LTM EEG initial tracings reviewed. There is generalized delta and theta slowing with some superimposed beta activity most likely secondary to recently administered benzodiazepines. There is some asymmetry, the right hemisphere overall with slower frequencies than the left. The activity is non-rhythmic, with no electrographic seizure seen.   Will increase Keppra dose to 1000 mg IV BID as some depolarizations are seen.   Electronically signed: Dr. Kerney Elbe

## 2018-01-29 NOTE — ED Notes (Signed)
Fentanyl paused per Dr. Venora Maples, BP 74 systolic.

## 2018-01-29 NOTE — Progress Notes (Signed)
EEG continues to show right-sided irritability on Versed 10 mg/h and after 3 boluses. Increased Versed to 20 mg/h Given one-time dose of Lyrica-200 mg   We will continue to monitor  -- -- Amie Portland, MD [facility] Pager: [phone] If 7pm to 7am, please call on call as listed on [facility].

## 2018-01-29 NOTE — Progress Notes (Signed)
LTM EEG checked, no skin breakdown noted. Pz, Cz, Fz, O2 and P4 all added paste and added glue.

## 2018-01-29 NOTE — Consult Note (Signed)
NEURO HOSPITALIST CONSULT NOTE   Requestig physician: Dr. Venora Maples  Reason for Consult: Status epilepticus  History obtained from:  Husband and Chart     HPI:                                                                                                                                          Samantha Atkins is an 67 y.o. female presenting to the ED via EMS after her husband found her seizing at home at 2115. He last saw her normal at 5-6 PM, so she may have been seizing for an extended period of time. She was in the back bedroom watching TV. Before her husband went to bed he went and looked in on her and found her lying partway off her bed with seizure-like activity and unresponsive.  She was also having difficulty breathing and was foaming at the mouth. Fifteen minutes after he found her, EMS arrived. She was still seizing and was administered 5 mg IV Versed by EMS with brief cessation of seizure activity. Just prior to ED arrival seizure recurred and she was administered 2 mg IV Ativan in the ED. Per Dr. Venora Maples, there was some eyelid twitching with eyes deviated to the right, as well as twitching of her RUE and RLE per nursing.   She was diagnosed with seizure disorder last year during a hospitalization, after having a witnessed seizure. She is currently taking Keppra 500 mg BID at home for this. Her husband states that he feels she likely has had seizures for the last 5-6 years due to occasional spells of LOC, but had not witnessed any seizure himself until tonight. She has been compliant with her Keppra, per husband.   At baseline she walks with a walker using a spastic gait, per husband. This is since repair of a left hip fracture, per husband.   Past Medical History:  Diagnosis Date  . Anxiety   . Arthritis    hands  . Depression   . Fracture of humerus, proximal, left, closed 08/07/2015  . Fracture, humerus closed 07-31-15 fell   left  . GERD (gastroesophageal reflux  disease)   . Headache   . Insomnia   . Migraines   . Osteoporosis     Past Surgical History:  Procedure Laterality Date  . ABDOMINAL HYSTERECTOMY    . FEMUR FRACTURE SURGERY Left    fell, redo hip replacement  . JOINT REPLACEMENT Bilateral    hip  . ORIF DISTAL RADIUS FRACTURE Left 03-2015  . ORIF HUMERUS FRACTURE Left 08/07/2015   Procedure: LEFT OPEN REDUCTION INTERNAL FIXATION (ORIF) PROXIMAL HUMERUS FRACTURE;  Surgeon: Marchia Bond, MD;  Location: [facility];  Service: Orthopedics;  Laterality: Left;  . TONSILLECTOMY    . TOTAL HIP  REVISION Left 02/14/2017   Procedure: LEFT ORIF PERIPROSTHETIC FEMORAL FRACTURE AND REVISION HIP ARTHROPLASTY;  Surgeon: Paralee Cancel, MD;  Location: [facility];  Service: Orthopedics;  Laterality: Left;    Family History  Problem Relation Age of Onset  . Hip fracture Mother   . Heart Problems Father               Social History:  reports that she has never smoked. She has never used smokeless tobacco. She reports that she does not drink alcohol or use drugs.  Allergies  Allergen Reactions  . Dust Mite Extract Cough    HOME MEDICATIONS:                                                                                                                        ROS:                                                                                                                                       Per husband, no intercurrent illness and no headache recently. No CP, limb pain, new limb weakness or sensory disturbance today. Other ROS as per HPI.    Blood pressure (!) 74/60, pulse (!) 49, temperature 98.8 F (37.1 C), resp. rate 16, height 5\' 2"  (1.575 m), weight 36.3 kg (80 lb), SpO2 99 %.   General Examination:                                                                                                       Physical Exam  HEENT-  Intubated. [place]/AT. Neck supple.  Lungs- Intubated Abdomen- Nondistended Extremities- Warm and  well perfused  Neurological Examination Mental Status: Eyes closed. Moves RUE upwards towards her husband when she hears his voice. No other spontaneous movement. No spontaneous eye opening or to stimulus. Moves right side weakly to noxious. Not following commands or attempting to communicate.   Cranial Nerves: II: No reaction to visual threat or penlight other than  pupillary constriction. PERRL.  III,IV, VI: Eyes conjugate without deviation or nystagmus. No doll's eye reflex elicitable.  V,VII: Face flaccidly symmetric. Moves RUE with brow ridge pressure bilaterally.  VIII: No response to voice IX,X: Intubated XI: Head turned to right with flaccid tone of neck musculature XII: Intubated Motor/Sensory: Flaccid tone x 4. Able to lift RUE antigravity to husband's voice. No movement of LUE to noxious.  No movement of either LE to plantar stimulation, except for flicker at ankles and toes. Flexor contractures at ankles noted. Spastic tone of bilateral LE also noted.  Deep Tendon Reflexes: 3+ bilateral upper extremities.  Brisk low amplitude patellars bilaterally.  Unelicitable achilles reflexes bilaterally.  Plantars: Mute bilaterally.  Cerebellar/Gait: Unable to assess   Lab Results: Basic Metabolic Panel: Recent Labs  Lab 01/28/18 2322  NA 140  K 3.3*  CL 104  CO2 18*  GLUCOSE 229*  BUN 17  CREATININE 0.89  CALCIUM 9.3    CBC: Recent Labs  Lab 01/28/18 2322  WBC 14.3*  HGB 14.1  HCT 42.0  MCV 102.4*  PLT 312    Cardiac Enzymes: No results for input(s): CKTOTAL, CKMB, CKMBINDEX, TROPONINI in the last 168 hours.  Lipid Panel: No results for input(s): CHOL, TRIG, HDL, CHOLHDL, VLDL, LDLCALC in the last 168 hours.  Imaging: Ct Head Wo Contrast  Result Date: 01/29/2018 CLINICAL DATA:  Unresponsive.  Possible seizure. EXAM: CT HEAD WITHOUT CONTRAST TECHNIQUE: Contiguous axial images were obtained from the base of the skull through the vertex without intravenous  contrast. COMPARISON:  Brain MR dated 02/15/2017 and head CT dated 02/13/2017. FINDINGS: Brain: Diffusely enlarged ventricles and subarachnoid spaces. Patchy white matter low density in both cerebral hemispheres. No intracranial hemorrhage, mass lesion or CT evidence of acute infarction. Vascular: No hyperdense vessel or unexpected calcification. Skull: Normal. Negative for fracture or focal lesion. Sinuses/Orbits: Unremarkable. Other: Bilateral temporomandibular joint degenerative changes. IMPRESSION: 1. No acute abnormality. 2. Stable diffuse cerebral and cerebellar atrophy. 3. Stable chronic small vessel white matter ischemic changes in both cerebral hemispheres. Electronically Signed   By: Claudie Revering M.D.   On: 01/29/2018 00:04    Assessment: 67 year old female with known seizure disorder on Keppra, presenting with status epilepticus 1. Received 5 mg Versed then 2 mg Ativan, followed by 1000 mg IV Keppra. Clinical seizure activity has abated.  2. Would expect right Todd's paresis given right sided twitching noted by ED team. However, RUE moves weakly on exam while LUE is flaccid without movement to noxious.  3. Bilateral lower extremities with spastic tone. May reflect ongoing seizure activity but such manifesting only in lower extremities without arm, face or eye involvement would be unusual. More likely due to postictal phenomenon or pre-existing spinal cord injury as husband states that she ambulates with a walker using a spastic gait. She has no documented imaging evidence of spinal cord injury per husband, but has had a C3 fracture in the past.  4. CT head shows no acute abnormality. Stable diffuse cerebral and cerebellar atrophy, as well as stable chronic small vessel white matter ischemic changes in both cerebral hemisphere are noted. 5. Leukocytosis. Can occur as a response to seizure but could also reflect infection.  6. Lactic acidosis. Most likely from seizure but sepsis also possible.    Recommendations: 1. LTM EEG has been ordered. Tech has been called in and will notify attending for review of EEG when leads are in place. If seizure activity is seen on EEG tracings, will  bolus Versed and start on Versed drip.  2. Continue Keppra at increased dose of 750 mg IV BID (50% increase over home dose).  3. Call neurology and administer 2 mg IV Ativan if clinical seizure recurs. 4. MRI brain and thoracic spine when stable.  5. Inpatient seizure precautions.  6. Magnesium level.  7. Infectious and toxic/metabolic work ups.   50 minutes spent in the emergent neurological evaluation and management of this critically ill patient.   Electronically signed: Dr. Kerney Elbe 01/29/2018, 12:07 AM

## 2018-01-29 NOTE — Progress Notes (Signed)
Pt with moderate amount of emesis; OG placed to ILWS with immediate removal of 213mLs orange-brown liquid

## 2018-01-29 NOTE — Progress Notes (Signed)
Subjective: No further seizures overnight.   Exam: Vitals:   01/29/18 0800 01/29/18 0815  BP: 108/83 109/81  Pulse: 85 85  Resp: 20 20  Temp: 100 F (37.8 C) 100 F (37.8 C)  SpO2: 100% 100%   Gen: In bed, NAD Resp: non-labored breathing, no acute distress Abd: soft, nt  Neuro: MS: Awake, alert, interactive, follows commands readily.  ZS:MOLMBEML vision in both hemifields, tracks across midline in both directions, PERRL Motor: moves all extremities well with good strength.  Sensory:endorses symmetric sensation.   Pertinent Labs: cmp - mild hypokalemia at 3.3, elevated glucose.   Impression: 67 yo F with breakthrough seizures. No further evidence of ongoing status epilepticus and she is improving clinically.   Recommendations: 1) if no seizures on EEG, can d/c LTM 2) continue keppra at 1g BID, increased from home dose 500mg  BID 3) will continue to follow.   Roland Rack, MD [facility] [phone]  If 7pm- 7am, please page neurology on call as listed in [place].

## 2018-01-29 NOTE — Progress Notes (Addendum)
[place] Progress Note Patient Name: Samantha Atkins DOB: 01-21-1951 MRN: [id number]   Date of Service  01/29/2018  HPI/Events of Note  Lactic Acid = 8.89 --> 3.7. Lactic Acid is clearing. Original elevation likely d/t status seizure.  eICU Interventions  Will order: 1. Bolus with 0.9 NaCl 1 liter IV over 1 hour now.      Intervention Category Major Interventions: Acid-Base disturbance - evaluation and management  Felita Bump [place] 01/29/2018, 2:19 AM

## 2018-01-29 NOTE — Progress Notes (Signed)
Patient with left sided weakness after seizures earlier. Now improving. She still has severe irritability on the right and I would favor scheduled benzos overnight tonight as well as a trial of topiramate.  Roland Rack, MD [facility] [phone]  If 7pm- 7am, please page neurology on call as listed in [place].

## 2018-01-29 NOTE — H&P (Signed)
PULMONARY / CRITICAL CARE MEDICINE   Name: Samantha Atkins MRN: [id number] DOB: Jan 29, 1951    ADMISSION DATE:  01/28/2018 CONSULTATION DATE: 01/29/2018     REFERRING MD:  Dr. Venora Maples   CHIEF COMPLAINT:  Seizure   HISTORY OF PRESENT ILLNESS:   67 year old female with PMH of Seizure, Osteoporosis, Depression, GERD, Insomnia  Presents to ED on 6/23 s/p seizure. Per Husband patient was found seizing and unresponsive. When EMS arrived patient was foaming at the mouth and was given 5 mg Versed. On arrival to ED patient continued to have seizures requiring 2 mg Ativan. Head CT negative. Intubated for airway protection. Given 1 mg Keppra. Neurology consulted. PCCM asked to admit.   PAST MEDICAL HISTORY :  She  has a past medical history of Anxiety, Arthritis, Depression, Fracture of humerus, proximal, left, closed (08/07/2015), Fracture, humerus closed (07-31-15 fell), GERD (gastroesophageal reflux disease), Headache, Insomnia, Migraines, and Osteoporosis.  PAST SURGICAL HISTORY: She  has a past surgical history that includes Abdominal hysterectomy; Tonsillectomy; Joint replacement (Bilateral); Femur fracture surgery (Left); ORIF distal radius fracture (Left, 03-2015); ORIF humerus fracture (Left, 08/07/2015); and Total hip revision (Left, 02/14/2017).  Allergies  Allergen Reactions  . Dust Mite Extract Cough    No current facility-administered medications on file prior to encounter.    Current Outpatient Medications on File Prior to Encounter  Medication Sig  . butalbital-acetaminophen-caffeine (FIORICET, ESGIC) 50-325-40 MG tablet Take 1 tablet by mouth every 6 (six) hours as needed for migraine.   . cholecalciferol (VITAMIN D) 1000 units tablet Take 1,000 Units by mouth daily.  Marland Kitchen docusate sodium (COLACE) 100 MG capsule Take 1 capsule (100 mg total) by mouth 2 (two) times daily.  . feeding supplement (BOOST HIGH PROTEIN) LIQD Take 1 Container by mouth daily.  . feeding supplement, ENSURE ENLIVE,  (ENSURE ENLIVE) LIQD Take 237 mLs by mouth 2 (two) times daily between meals. (Patient not taking: Reported on 03/08/2017)  . ferrous sulfate 325 (65 FE) MG EC tablet Take 1 tablet (325 mg total) by mouth every other day.  Marland Kitchen HYDROcodone-acetaminophen (NORCO) 5-325 MG tablet Take 1-2 tablets by mouth every 6 (six) hours as needed.  . levETIRAcetam (KEPPRA) 500 MG tablet Take 1 tablet (500 mg total) by mouth 2 (two) times daily.  Marland Kitchen lidocaine (LIDODERM) 5 % Place 1 patch onto the skin daily. Remove & Discard patch within 12 hours or as directed by MD  . Magnesium Oxide 400 (240 Mg) MG TABS Take 1 tablet (400 mg total) by mouth daily.  . methocarbamol (ROBAXIN) 500 MG tablet Take 1 tablet (500 mg total) by mouth every 6 (six) hours as needed for muscle spasms.  . Multiple Vitamin (MULTIVITAMIN WITH MINERALS) TABS tablet Take 1 tablet by mouth daily.  . polyethylene glycol (MIRALAX / GLYCOLAX) packet Take 17 g by mouth daily as needed for mild constipation. (Patient not taking: Reported on 03/08/2017)  . potassium chloride SA (K-DUR,KLOR-CON) 20 MEQ tablet Take 20 mEq by mouth daily.  Marland Kitchen senna-docusate (SENOKOT-S) 8.6-50 MG tablet Take 1 tablet by mouth at bedtime.  . sertraline (ZOLOFT) 100 MG tablet Take 100 mg by mouth daily.    FAMILY HISTORY:  Her indicated that the status of her mother is unknown. She indicated that the status of her father is unknown.   SOCIAL HISTORY: She  reports that she has never smoked. She has never used smokeless tobacco. She reports that she does not drink alcohol or use drugs.  REVIEW OF SYSTEMS:  Unable to review as patient is intubated/sedated   SUBJECTIVE:   VITAL SIGNS: BP 94/65   Pulse 85   Temp 99.3 F (37.4 C)   Resp 20   Ht 5\' 2"  (1.575 m)   Wt 36.3 kg (80 lb)   SpO2 100%   BMI 14.63 kg/m   HEMODYNAMICS:    VENTILATOR SETTINGS: Vent Mode: PRVC FiO2 (%):  [40 %-100 %] 40 % Set Rate:  [16 bmp-20 bmp] 20 bmp Vt Set:  [400 mL] 400 mL PEEP:  [5  cmH20] 5 cmH20 Plateau Pressure:  [14 cmH20] 14 cmH20  INTAKE / OUTPUT: No intake/output data recorded.  PHYSICAL EXAMINATION: General:  THIN adult female, on vent  Neuro:  Obtunded, does not follow commands, pupils intact  HEENT:  Dry MM  Cardiovascular:  RRR, no MRG Lungs:  Clear breath sounds, no wheeze/crackles  Abdomen:  Thin, active bowel sounds  Musculoskeletal:  -edema  Skin:  Warm, dry, intact   LABS:  BMET Recent Labs  Lab 01/28/18 2322  NA 140  K 3.3*  CL 104  CO2 18*  BUN 17  CREATININE 0.89  GLUCOSE 229*    Electrolytes Recent Labs  Lab 01/28/18 2322  CALCIUM 9.3    CBC Recent Labs  Lab 01/28/18 2322  WBC 14.3*  HGB 14.1  HCT 42.0  PLT 312    Coag's Recent Labs  Lab 01/28/18 2322  INR 1.06    Sepsis Markers Recent Labs  Lab 01/28/18 2331  LATICACIDVEN 8.89*    ABG Recent Labs  Lab 01/29/18 0004  PHART 7.310*  PCO2ART 49.5*  PO2ART 514.0*    Liver Enzymes Recent Labs  Lab 01/28/18 2322  AST 46*  ALT 43  ALKPHOS 93  BILITOT 0.3  ALBUMIN 3.8    Cardiac Enzymes No results for input(s): TROPONINI, PROBNP in the last 168 hours.  Glucose No results for input(s): GLUCAP in the last 168 hours.  Imaging Ct Head Wo Contrast  Result Date: 01/29/2018 CLINICAL DATA:  Unresponsive.  Possible seizure. EXAM: CT HEAD WITHOUT CONTRAST TECHNIQUE: Contiguous axial images were obtained from the base of the skull through the vertex without intravenous contrast. COMPARISON:  Brain MR dated 02/15/2017 and head CT dated 02/13/2017. FINDINGS: Brain: Diffusely enlarged ventricles and subarachnoid spaces. Patchy white matter low density in both cerebral hemispheres. No intracranial hemorrhage, mass lesion or CT evidence of acute infarction. Vascular: No hyperdense vessel or unexpected calcification. Skull: Normal. Negative for fracture or focal lesion. Sinuses/Orbits: Unremarkable. Other: Bilateral temporomandibular joint degenerative  changes. IMPRESSION: 1. No acute abnormality. 2. Stable diffuse cerebral and cerebellar atrophy. 3. Stable chronic small vessel white matter ischemic changes in both cerebral hemispheres. Electronically Signed   By: Claudie Revering M.D.   On: 01/29/2018 00:04   Dg Chest Portable 1 View  Result Date: 01/29/2018 CLINICAL DATA:  Intubated.  Seizure. EXAM: PORTABLE CHEST 1 VIEW COMPARISON:  02/17/2017. FINDINGS: Normal sized heart. Tortuous and calcified thoracic aorta. Endotracheal tube tip 3.6 cm above the carina. Nasogastric tube extending into the stomach. Bilateral nipple shadows. Clear lungs. Proximal left humerus fixation hardware. IMPRESSION: No acute abnormality. Electronically Signed   By: Claudie Revering M.D.   On: 01/29/2018 00:05     STUDIES:  CXR 6/24 > Normal sized heart. Tortuous and calcified thoracic aorta. Endotracheal tube tip 3.6 cm above the carina. Nasogastric tube extending into the stomach. Bilateral nipple shadows. Clear lungs. Proximal left humerus fixation hardware. CT Head 6/24 > No acute abnormality. 2. Stable  diffuse cerebral and cerebellar atrophy. 3. Stable chronic small vessel white matter ischemic changes in both cerebral hemispheres.  CULTURES: Blood 6/24 >> Urine 6/24 >>  ANTIBIOTICS: None.   SIGNIFICANT EVENTS: 6/23 > Presents to ED   LINES/TUBES: ETT 6/24 >>  DISCUSSION: 67 year old female presents s/p seizure obtunded requiring intubation.   ASSESSMENT / PLAN:  Respiratory Insufficieny s/p Seizure  P:   Vent Support  Trend ABG/CXR Pulmonary Hygiene VAP Bundle   Hypotension in setting of sepsis vs medication effect  -Given 5 mg Versed, 2 mg ativan, Fentanyl, on arrival BP 90 systolic which is baseline  P:  Cardiac Monitoring  Wean Levophed to Maintain MAP >65 (Patient baseline BP is 90 systolic)   Anion Gap Metabolic Acidosis with Lactic Acidosis  Hypokalemia  P:   Trend BMP  Replace electrolytes as indicated  Trend LA  NS @ 75 ml/hr    Malnutrition  SUP P:   NPO PPI  Normocytic Anemia  VTE  P:  Trend CBC  Heparin SQ   Leukocytosis (suspected secondary to seizure)  P:   Trend WBC and Fever Curve Trend PCT and LA  Follow Culture Data   Hyperglycemia    P:   Trend Glucose SSI  Cortisol pending    Encephalopathy in setting of 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   - Inter-disciplinary family meet or Palliative Care meeting due by: 02/05/2018     Hayden Pedro, AGACNP-BC [facility]  Pgr: [phone]  PCCM Pgr: [phone]

## 2018-01-29 NOTE — ED Provider Notes (Signed)
[facility] EMERGENCY DEPARTMENT Provider Note   CSN: [id number] Arrival date & time: 01/28/18  2315     History   Chief Complaint Chief Complaint  Patient presents with  . Seizures   Level 5 caveat: Altered mental status   HPI Samantha Atkins is a 67 y.o. female.  HPI Patient is a 67 year old female with a history of seizure on Keppra who was found by husband to be seizing for an unknown amount of time.  Last time he saw her was at approximately 5 PM.  She was in the back bedroom watching TV.  Before he went to bed he went and looked in on her and found her lying partway off the bed with seizure-like activity.  She was unresponsive.  She was having difficulty breathing.  She was foaming at the mouth.  EMS was called.  5 mg of Versed given by EMS.  EMS states seizure resolved with the Versed however she began seizing again just prior to arrival for EMS.  On arrival to the emergency department she was given 2 mg of IV Ativan by the off going provider.  At time of my evaluation she appears to have some eyelid twitching in her eyes are some deviated to the right.  She has 2 nasopharyngeal airway is in place and is on a nonrebreather mask.  Subsequent history obtained with the patient's husband showed up.  She has been in her normal state of health over the past several days except for a mild cough.  No fevers.  No complaints earlier today.  It was a rather normal day.  She is compliant with her medications and including compliant with her Keppra.  Keppra was started 1 year ago after witnessed seizure while in the hospital.  She has not seen a neurologist in follow-up.   Past Medical History:  Diagnosis Date  . Anxiety   . Arthritis    hands  . Depression   . Fracture of humerus, proximal, left, closed 08/07/2015  . Fracture, humerus closed 07-31-15 fell   left  . GERD (gastroesophageal reflux disease)   . Headache   . Insomnia   . Migraines   . Osteoporosis      Patient Active Problem List   Diagnosis Date Noted  . Protein-calorie malnutrition, severe 02/15/2017  . Hip fracture ([place]) 02/12/2017  . Osteoporosis   . Migraines   . Depression   . Fracture of humerus, proximal, left, closed 08/07/2015    Past Surgical History:  Procedure Laterality Date  . ABDOMINAL HYSTERECTOMY    . FEMUR FRACTURE SURGERY Left    fell, redo hip replacement  . JOINT REPLACEMENT Bilateral    hip  . ORIF DISTAL RADIUS FRACTURE Left 03-2015  . ORIF HUMERUS FRACTURE Left 08/07/2015   Procedure: LEFT OPEN REDUCTION INTERNAL FIXATION (ORIF) PROXIMAL HUMERUS FRACTURE;  Surgeon: Marchia Bond, MD;  Location: [facility];  Service: Orthopedics;  Laterality: Left;  . TONSILLECTOMY    . TOTAL HIP REVISION Left 02/14/2017   Procedure: LEFT ORIF PERIPROSTHETIC FEMORAL FRACTURE AND REVISION HIP ARTHROPLASTY;  Surgeon: Paralee Cancel, MD;  Location: [facility];  Service: Orthopedics;  Laterality: Left;     OB History    Gravida  0   Para  0   Term  0   Preterm  0   AB  0   Living        SAB  0   TAB  0   Ectopic  0   Multiple      Live Births               Home Medications    Prior to Admission medications   Medication Sig Start Date End Date Taking? Authorizing Provider  butalbital-acetaminophen-caffeine (FIORICET, ESGIC) 50-325-40 MG tablet Take 1 tablet by mouth every 6 (six) hours as needed for migraine.     [provider]  cholecalciferol (VITAMIN D) 1000 units tablet Take 1,000 Units by mouth daily.    [provider]  docusate sodium (COLACE) 100 MG capsule Take 1 capsule (100 mg total) by mouth 2 (two) times daily. 02/16/17   Danae Orleans, PA-C  feeding supplement (BOOST HIGH PROTEIN) LIQD Take 1 Container by mouth daily.    [provider]  feeding supplement, ENSURE ENLIVE, (ENSURE ENLIVE) LIQD Take 237 mLs by mouth 2 (two) times daily between meals. Patient not taking: Reported on 03/08/2017  02/17/17   Florencia Reasons, MD  ferrous sulfate 325 (65 FE) MG EC tablet Take 1 tablet (325 mg total) by mouth every other day. 02/17/17 02/17/18  Florencia Reasons, MD  HYDROcodone-acetaminophen (NORCO) 5-325 MG tablet Take 1-2 tablets by mouth every 6 (six) hours as needed. 03/08/17   Veryl Speak, MD  levETIRAcetam (KEPPRA) 500 MG tablet Take 1 tablet (500 mg total) by mouth 2 (two) times daily. 02/17/17   Florencia Reasons, MD  lidocaine (LIDODERM) 5 % Place 1 patch onto the skin daily. Remove & Discard patch within 12 hours or as directed by MD 02/18/17   Florencia Reasons, MD  Magnesium Oxide 400 (240 Mg) MG TABS Take 1 tablet (400 mg total) by mouth daily. 02/17/17   Florencia Reasons, MD  methocarbamol (ROBAXIN) 500 MG tablet Take 1 tablet (500 mg total) by mouth every 6 (six) hours as needed for muscle spasms. 02/16/17   Danae Orleans, PA-C  Multiple Vitamin (MULTIVITAMIN WITH MINERALS) TABS tablet Take 1 tablet by mouth daily.    [provider]  polyethylene glycol (MIRALAX / GLYCOLAX) packet Take 17 g by mouth daily as needed for mild constipation. Patient not taking: Reported on 03/08/2017 02/16/17   Danae Orleans, PA-C  potassium chloride SA (K-DUR,KLOR-CON) 20 MEQ tablet Take 20 mEq by mouth daily.    [provider]  senna-docusate (SENOKOT-S) 8.6-50 MG tablet Take 1 tablet by mouth at bedtime. 02/17/17   Florencia Reasons, MD  sertraline (ZOLOFT) 100 MG tablet Take 100 mg by mouth daily.    [provider]    Family History Family History  Problem Relation Age of Onset  . Hip fracture Mother   . Heart Problems Father     Social History Social History   Tobacco Use  . Smoking status: Never Smoker  . Smokeless tobacco: Never Used  Substance Use Topics  . Alcohol use: No  . Drug use: No     Allergies   Dust mite extract   Review of Systems Review of Systems  Unable to perform ROS: Mental status change     Physical Exam Updated Vital Signs BP (!) 83/59   Pulse (!) 51   Temp 99.1 F (37.3  C)   Resp 13   Ht 5\' 2"  (1.575 m)   Wt 36.3 kg (80 lb)   SpO2 98%   BMI 14.63 kg/m   Physical Exam  Constitutional: She appears well-developed and well-nourished.  Bilateral nasopharyngeal airway is in place  HENT:  Head: Normocephalic.  Superficial abrasion and bruising of her right  periorbital region without significant swelling or deformity of the periorbital region.  Eyes:  Eyes deviated to the right  Neck: No tracheal deviation present. No thyromegaly present.  Cardiovascular: Regular rhythm and normal heart sounds.  Tachycardia  Pulmonary/Chest: Effort normal and breath sounds normal.  Abdominal: Soft. She exhibits no distension.  Musculoskeletal: Normal range of motion.  Neurological:  CCS 3  Skin: Skin is warm and dry.  Psychiatric: She has a normal mood and affect. Judgment normal.  Nursing note and vitals reviewed.    ED Treatments / Results  Labs (all labs ordered are listed, but only abnormal results are displayed) Labs Reviewed  COMPREHENSIVE METABOLIC PANEL - Abnormal; Notable for the following components:      Result Value   Potassium 3.3 (*)    CO2 18 (*)    Glucose, Bld 229 (*)    AST 46 (*)    Anion gap 18 (*)    All other components within normal limits  CBC - Abnormal; Notable for the following components:   WBC 14.3 (*)    MCV 102.4 (*)    MCH 34.4 (*)    All other components within normal limits  I-STAT CG4 LACTIC ACID, ED - Abnormal; Notable for the following components:   Lactic Acid, Venous 8.89 (*)    All other components within normal limits  I-STAT ARTERIAL BLOOD GAS, ED - Abnormal; Notable for the following components:   pH, Arterial 7.310 (*)    pCO2 arterial 49.5 (*)    pO2, Arterial 514.0 (*)    All other components within normal limits  CULTURE, BLOOD (ROUTINE X 2)  CULTURE, BLOOD (ROUTINE X 2)  URINE CULTURE  PROTIME-INR  BLOOD GAS, ARTERIAL  LACTIC ACID, PLASMA  URINALYSIS, ROUTINE W REFLEX MICROSCOPIC  CBG MONITORING,  ED    EKG EKG Interpretation  Date/Time:  Sunday January 28 2018 23:15:39 EDT Ventricular Rate:  111 PR Interval:    QRS Duration: 88 QT Interval:  366 QTC Calculation: 493 R Axis:   -67 Text Interpretation:  Sinus tachycardia Atrial premature complex Probable left atrial enlargement Left anterior fascicular block Consider anterior infarct No significant change was found Confirmed by Jola Schmidt [phone]) on 01/29/2018 12:14:49 AM   Radiology Ct Head Wo Contrast  Result Date: 01/29/2018 CLINICAL DATA:  Unresponsive.  Possible seizure. EXAM: CT HEAD WITHOUT CONTRAST TECHNIQUE: Contiguous axial images were obtained from the base of the skull through the vertex without intravenous contrast. COMPARISON:  Brain MR dated 02/15/2017 and head CT dated 02/13/2017. FINDINGS: Brain: Diffusely enlarged ventricles and subarachnoid spaces. Patchy white matter low density in both cerebral hemispheres. No intracranial hemorrhage, mass lesion or CT evidence of acute infarction. Vascular: No hyperdense vessel or unexpected calcification. Skull: Normal. Negative for fracture or focal lesion. Sinuses/Orbits: Unremarkable. Other: Bilateral temporomandibular joint degenerative changes. IMPRESSION: 1. No acute abnormality. 2. Stable diffuse cerebral and cerebellar atrophy. 3. Stable chronic small vessel white matter ischemic changes in both cerebral hemispheres. Electronically Signed   By: Claudie Revering M.D.   On: 01/29/2018 00:04   Dg Chest Portable 1 View  Result Date: 01/29/2018 CLINICAL DATA:  Intubated.  Seizure. EXAM: PORTABLE CHEST 1 VIEW COMPARISON:  02/17/2017. FINDINGS: Normal sized heart. Tortuous and calcified thoracic aorta. Endotracheal tube tip 3.6 cm above the carina. Nasogastric tube extending into the stomach. Bilateral nipple shadows. Clear lungs. Proximal left humerus fixation hardware. IMPRESSION: No acute abnormality. Electronically Signed   By: Claudie Revering M.D.   On: 01/29/2018 00:05  Procedures Procedure Name: Intubation Performed by: Jola Schmidt, MD Pre-anesthesia Checklist: Patient identified, Patient being monitored, Emergency Drugs available, Timeout performed and Suction available Oxygen Delivery Method: Non-rebreather mask Preoxygenation: Pre-oxygenation with 100% oxygen Induction Type: Rapid sequence Ventilation: Mask ventilation without difficulty Laryngoscope Size: Glidescope Grade View: Grade I Tube size: 7.5 mm Number of attempts: 1 Placement Confirmation: ETT inserted through vocal cords under direct vision,  CO2 detector and Breath sounds checked- equal and bilateral    .Critical Care Performed by: Jola Schmidt, MD Authorized by: Jola Schmidt, MD     CRITICAL CARE Performed by: Jola Schmidt Total critical care time: 45 minutes Critical care time was exclusive of separately billable procedures and treating other patients. Critical care was necessary to treat or prevent imminent or life-threatening deterioration. Critical care was time spent personally by me on the following activities: development of treatment plan with patient and/or surrogate as well as nursing, discussions with consultants, evaluation of patient's response to treatment, examination of patient, obtaining history from patient or surrogate, ordering and performing treatments and interventions, ordering and review of laboratory studies, ordering and review of radiographic studies, pulse oximetry and re-evaluation of patient's condition.   Medications Ordered in ED Medications  fentaNYL 256mcg in NS 228mL (25mcg/ml) infusion-PREMIX (0 mcg/hr Intravenous Paused 01/28/18 2348)  levETIRAcetam (KEPPRA) IVPB 1000 mg/100 mL premix (0 mg Intravenous Stopped 01/28/18 2346)  fentaNYL (SUBLIMAZE) injection 50 mcg (50 mcg Intravenous Given 01/28/18 2347)  etomidate (AMIDATE) injection (20 mg Intravenous Given 01/28/18 2331)  succinylcholine (ANECTINE) injection (100 mg Intravenous Given  01/28/18 2332)  norepinephrine (LEVOPHED) 4mg  in D5W 277mL premix infusion (2 mcg/min Intravenous New Bag/Given 01/29/18 0020)     Initial Impression / Assessment and Plan / ED Course  I have reviewed the triage vital signs and the nursing notes.  Pertinent labs & imaging results that were available during my care of the patient were reviewed by me and considered in my medical decision making (see chart for details).    Patient with ongoing seizure activity on arrival.  Will load with Keppra at this time.  Stat head CT demonstrates no acute intracranial abnormality.  Intubated for airway protection given GCS of 3.  Patient hypotensive.  We will add blood and urine cultures.  Patient's blood pressure requiring augmentation with levo fed at this time.  Will hold sedation.  Hoping the patient will wake up and begin having difficulty with the ventilator.  Currently remains GCS 3.  Norepinephrine now.  Stat neurology consultation for possible bedside EEG to evaluate for subclinical status.  Discussed case with the intensivist team for evaluation and admission to the ICU.  Patient has been updated.  Admit to ICU  Consultations: Dr Cheral Marker -neurology Dr Madalyn Rob - intensivist  Patient will continue to be followed closely while in the emergency department.    Final Clinical Impressions(s) / ED Diagnoses   Final diagnoses:  Status epilepticus ([place])  Hypotension, unspecified hypotension type    ED Discharge Orders    None       Jola Schmidt, MD 01/29/18 [id number]

## 2018-01-29 NOTE — Progress Notes (Signed)
Over the course of the morning, she has begun having frequent electrographic seizures on EEG despite increased dose of keppra, treating with IV ativan and will start fosphenytoin and monitor for effect, plan to treat aggressively if no response to initial therapy.     This patient is critically ill and at significant risk of neurological worsening, death and care requires constant monitoring of vital signs, hemodynamics,respiratory and cardiac monitoring, neurological assessment, discussion with family, other specialists and medical decision making of high complexity. I spent 35 minutes of neurocritical care time  in the care of  this patient.  Roland Rack, MD [facility] [phone]  If 7pm- 7am, please page neurology on call as listed in [facility]. 01/29/2018  11:59 AM

## 2018-01-29 NOTE — Procedures (Signed)
LTM-EEG Report  HISTORY: Continuous video-EEG monitoring performed for 67 year old with breakthrough seizures.  ACQUISITION: International 10-20 system for electrode placement; 18 channels with additional eyes linked to ipsilateral ears and EKG. Additional T1-T2 electrodes were used. Continuous video recording obtained.   EEG NUMBER:  MEDICATIONS:  Day 1: LEV  DAY #1: from 022106/24/19 to 0730 6/24/019 BACKGROUND: An overall medium voltage continuous recording with some spontaneous variability and reactivity. There were occasional periods of waking background with a medium voltage 8 Hz posterior dominant rhythm bilaterally (less sustained on the right) with low voltage beta activity in the bilateral frontocentral regions. Background slow activity was asymmetric with primarily theta-delta activity over the left and delta-theta activity over the right (maximal slowing posteriorly). State changes were captured with asymmetric sleep spindles (decreased over the right).  EPILEPTIFORM/PERIODIC ACTIVITY: There were occasional short runs of brief rhythmic discharges (sharp 3-4Hz  activity, left frontal maximal) lasting 5 seconds. On a few occasions some change in morphology and frequency could be seen in these brief runs. There were no clinical signs with these runs. SEIZURES:  Several runs of brief rhythmic discharges (less then 5 seconds duration without definite ictal evolution). EVENTS: None reported  EKG: no significant arrhythmia  SUMMARY: This was an abnormal continuous video EEG due to asymmetric, slow background over the right posterior region with additional runs of brief rhythmic activity in the left frontal region. This was indicative of a focal cerebral disturbance on the right and an additional area of cortical irritability in the left anterior regions.

## 2018-01-29 NOTE — Progress Notes (Signed)
Patient transported from ED to 4N32 without any complications.

## 2018-01-29 NOTE — Progress Notes (Signed)
LTM EEG initiated. Staff educated regarding the pt event button.

## 2018-01-29 NOTE — Progress Notes (Signed)
PULMONARY / CRITICAL CARE MEDICINE   Name: Samantha Atkins MRN: [id number] DOB: 1951/08/04    ADMISSION DATE:  01/28/2018 CONSULTATION DATE: 01/29/2018     REFERRING MD:  Dr. Venora Maples   CHIEF COMPLAINT:  Seizure   HISTORY OF PRESENT ILLNESS:   67 year old female with PMH of Seizure, Osteoporosis, Depression, GERD, Insomnia  Presents to ED on 6/23 s/p seizure. Per Husband patient was found seizing and unresponsive. When EMS arrived patient was foaming at the mouth and was given 5 mg Versed. On arrival to ED patient continued to have seizures requiring 2 mg Ativan. Head CT negative. Intubated for airway protection. Given 1 mg Keppra. Neurology consulted. PCCM asked to admit.   SUBJECTIVE:  Some ongoing focal disturbance on EEG Awake, looks great, poor effort on SBT  Remains on levophed, weaning   VITAL SIGNS: BP 103/79   Pulse 84   Temp 100.2 F (37.9 C)   Resp 20   Ht 5\' 2"  (1.575 m)   Wt 38.8 kg (85 lb 8.6 oz)   SpO2 100%   BMI 15.65 kg/m   HEMODYNAMICS:    VENTILATOR SETTINGS: Vent Mode: PRVC FiO2 (%):  [40 %-100 %] 40 % Set Rate:  [16 bmp-20 bmp] 20 bmp Vt Set:  [400 mL] 400 mL PEEP:  [5 cmH20] 5 cmH20 Plateau Pressure:  [13 cmH20-17 cmH20] 17 cmH20  INTAKE / OUTPUT: I/O last 3 completed shifts: In: 624.8 [I.V.:624.8] Out: 200 [Urine:200]  PHYSICAL EXAMINATION: General:thin female, NAD on vent Neuro: awake, follows commands, slow to respond at times, on low dose fentanyl  HEENT:  Dry MM  Cardiovascular:  RRR, no MRG Lungs:  resps even non labored on vent, poor pt effort on SBT, coarse  Abdomen:  Thin, soft, +bs  Musculoskeletal:  Warm and dry, no edema  Skin:  Warm, dry, intact   LABS:  BMET Recent Labs  Lab 01/28/18 2322  NA 140  K 3.3*  CL 104  CO2 18*  BUN 17  CREATININE 0.89  GLUCOSE 229*    Electrolytes Recent Labs  Lab 01/28/18 2322 01/29/18 0311  CALCIUM 9.3  --   MG  --  1.9    CBC Recent Labs  Lab 01/28/18 2322  WBC 14.3*  HGB  14.1  HCT 42.0  PLT 312    Coag's Recent Labs  Lab 01/28/18 2322  INR 1.06    Sepsis Markers Recent Labs  Lab 01/28/18 2331 01/29/18 0038 01/29/18 0039 01/29/18 0311  LATICACIDVEN 8.89* 3.7*  --  2.5*  PROCALCITON  --   --  0.32  --     ABG Recent Labs  Lab 01/29/18 0004 01/29/18 0358  PHART 7.310* 7.354  PCO2ART 49.5* 32.6  PO2ART 514.0* 155.0*    Liver Enzymes Recent Labs  Lab 01/28/18 2322  AST 46*  ALT 43  ALKPHOS 93  BILITOT 0.3  ALBUMIN 3.8    Cardiac Enzymes No results for input(s): TROPONINI, PROBNP in the last 168 hours.  Glucose Recent Labs  Lab 01/29/18 0214 01/29/18 0317 01/29/18 0843  GLUCAP 156* 128* 132*    Imaging Ct Head Wo Contrast  Result Date: 01/29/2018 CLINICAL DATA:  Unresponsive.  Possible seizure. EXAM: CT HEAD WITHOUT CONTRAST TECHNIQUE: Contiguous axial images were obtained from the base of the skull through the vertex without intravenous contrast. COMPARISON:  Brain MR dated 02/15/2017 and head CT dated 02/13/2017. FINDINGS: Brain: Diffusely enlarged ventricles and subarachnoid spaces. Patchy white matter low density in both cerebral hemispheres.  No intracranial hemorrhage, mass lesion or CT evidence of acute infarction. Vascular: No hyperdense vessel or unexpected calcification. Skull: Normal. Negative for fracture or focal lesion. Sinuses/Orbits: Unremarkable. Other: Bilateral temporomandibular joint degenerative changes. IMPRESSION: 1. No acute abnormality. 2. Stable diffuse cerebral and cerebellar atrophy. 3. Stable chronic small vessel white matter ischemic changes in both cerebral hemispheres. Electronically Signed   By: Claudie Revering M.D.   On: 01/29/2018 00:04   Dg Chest Portable 1 View  Result Date: 01/29/2018 CLINICAL DATA:  Intubated.  Seizure. EXAM: PORTABLE CHEST 1 VIEW COMPARISON:  02/17/2017. FINDINGS: Normal sized heart. Tortuous and calcified thoracic aorta. Endotracheal tube tip 3.6 cm above the carina.  Nasogastric tube extending into the stomach. Bilateral nipple shadows. Clear lungs. Proximal left humerus fixation hardware. IMPRESSION: No acute abnormality. Electronically Signed   By: Claudie Revering M.D.   On: 01/29/2018 00:05     STUDIES:  CXR 6/24 > Normal sized heart. Tortuous and calcified thoracic aorta. Endotracheal tube tip 3.6 cm above the carina. Nasogastric tube extending into the stomach. Bilateral nipple shadows. Clear lungs. Proximal left humerus fixation hardware. CT Head 6/24 > No acute abnormality. 2. Stable diffuse cerebral and cerebellar atrophy. 3. Stable chronic small vessel white matter ischemic changes in both cerebral hemispheres.  CULTURES: Blood 6/24 >> Urine 6/24 >>  ANTIBIOTICS: None.   SIGNIFICANT EVENTS: 6/23 > Presents to ED   LINES/TUBES: ETT 6/24 >>  DISCUSSION: 67 year old female presents s/p seizure obtunded requiring intubation.   ASSESSMENT / PLAN:  Respiratory Insufficieny s/p Seizure  P:   Vent support - 8cc/kg  F/u CXR  F/u ABG Daily SBT  Poor effort on SBT this am -- wean fentanyl (already very low dose) and try again this pm    Hypotension in setting of ?sepsis vs medication effect - SBP 90's at baseline  P:  Cardiac Monitoring  Wean Levophed to Maintain MAP >60  Trend lactate, pct  Gentle volume   Anion Gap Metabolic Acidosis with Lactic Acidosis  Hypokalemia  P:   Trend BMP  Replace electrolytes as indicated  Trend LA  Gentle volume as above   Malnutrition  SUP P:   NPO PPI Consider TF if remains intubated 6/25  Normocytic Anemia  VTE  P:  Trend CBC  Heparin SQ   Leukocytosis (suspected secondary to seizure)  P:   Follow cultures  Trend pct    Hyperglycemia    P:   Trend Glucose SSI  Cortisol pending    Encephalopathy in setting of Status Epilepticus vs Postictal state - improved.  H/O Multiple Falls, Osteo, Seizure on Previous admission 02/2017, Depression  P:   RASS goal: 0/-1 Neuro  following  Continuous EEG  Continue keppra per neuro  UDS POS for benzos, THC    FAMILY  - Updates: no family at bedside on NP rounds 6/24   - Inter-disciplinary family meet or Palliative Care meeting due by: 02/05/2018    Nickolas Madrid, NP 01/29/2018  9:38 AM Pager: [phone] or [phone]

## 2018-01-29 NOTE — Progress Notes (Signed)
[facility] Progress Note Patient Name: Raiza Kiesel DOB: 04-07-1951 MRN: [id number]   Date of Service  01/29/2018  HPI/Events of Note  Agitation - Request for bilateral soft wrist restraints.   eICU Interventions  Will order: 1. Bilateral soft wrist restraints.     Intervention Category Minor Interventions: Agitation / anxiety - evaluation and management  Kalsey Lull Eugene 01/29/2018, 2:12 AM

## 2018-01-29 NOTE — Progress Notes (Signed)
Pt received 4mg  IV Ativan for seizure activity, new AEDs started by neuro. At 1430 neuro assessment pt was unable to move LUE and LLE which is a change from prior assessment. Kirkpatrick MD called, no new orders received.

## 2018-01-30 ENCOUNTER — Inpatient Hospital Stay (HOSPITAL_COMMUNITY): Payer: Commercial Managed Care - PPO

## 2018-01-30 DIAGNOSIS — R569 Unspecified convulsions: Secondary | ICD-10-CM

## 2018-01-30 LAB — GLUCOSE, CAPILLARY
GLUCOSE-CAPILLARY: 81 mg/dL (ref 70–99)
Glucose-Capillary: 112 mg/dL — ABNORMAL HIGH (ref 70–99)
Glucose-Capillary: 147 mg/dL — ABNORMAL HIGH (ref 70–99)
Glucose-Capillary: 162 mg/dL — ABNORMAL HIGH (ref 70–99)
Glucose-Capillary: 172 mg/dL — ABNORMAL HIGH (ref 70–99)
Glucose-Capillary: 96 mg/dL (ref 70–99)
Glucose-Capillary: 98 mg/dL (ref 70–99)

## 2018-01-30 LAB — URINE CULTURE: CULTURE: NO GROWTH

## 2018-01-30 LAB — MAGNESIUM: MAGNESIUM: 1.6 mg/dL — AB (ref 1.7–2.4)

## 2018-01-30 LAB — BASIC METABOLIC PANEL
Anion gap: 5 (ref 5–15)
BUN: 6 mg/dL — AB (ref 8–23)
CHLORIDE: 112 mmol/L — AB (ref 98–111)
CO2: 19 mmol/L — AB (ref 22–32)
Calcium: 7.6 mg/dL — ABNORMAL LOW (ref 8.9–10.3)
Creatinine, Ser: 0.51 mg/dL (ref 0.44–1.00)
GFR calc Af Amer: >60 mL/min (ref >60)
GFR calc non Af Amer: >60 mL/min (ref >60)
Glucose, Bld: 165 mg/dL — ABNORMAL HIGH (ref 70–99)
POTASSIUM: 3.3 mmol/L — AB (ref 3.5–5.1)
SODIUM: 136 mmol/L (ref 135–145)

## 2018-01-30 LAB — CBC
HEMATOCRIT: 36 % (ref 36.0–46.0)
Hemoglobin: 11.8 g/dL — ABNORMAL LOW (ref 12.0–15.0)
MCH: 34.1 pg — AB (ref 26.0–34.0)
MCHC: 32.8 g/dL (ref 30.0–36.0)
MCV: 104 fL — AB (ref 78.0–100.0)
Platelets: 219 10*3/uL (ref 150–400)
RBC: 3.46 MIL/uL — ABNORMAL LOW (ref 3.87–5.11)
RDW: 12.6 % (ref 11.5–15.5)
WBC: 17.2 10*3/uL — AB (ref 4.0–10.5)

## 2018-01-30 LAB — CSF CELL COUNT WITH DIFFERENTIAL
RBC COUNT CSF: 2 /mm3 — AB
TUBE #: 3
WBC, CSF: 1 /mm3 (ref 0–5)

## 2018-01-30 LAB — PROTEIN AND GLUCOSE, CSF
Glucose, CSF: 78 mg/dL — ABNORMAL HIGH (ref 40–70)
Total  Protein, CSF: 20 mg/dL (ref 15–45)

## 2018-01-30 LAB — PROCALCITONIN: Procalcitonin: 1.26 ng/mL

## 2018-01-30 LAB — PHENYTOIN LEVEL, TOTAL: PHENYTOIN LVL: 13.2 ug/mL (ref 10.0–20.0)

## 2018-01-30 LAB — PHOSPHORUS: PHOSPHORUS: 1.9 mg/dL — AB (ref 2.5–4.6)

## 2018-01-30 MED ORDER — PREGABALIN 75 MG PO CAPS: 200.0000 mg | ORAL_CAPSULE | Freq: Three times a day (TID) | ORAL | Status: DC

## 2018-01-30 MED ORDER — CALCIUM GLUCONATE 10 % IV SOLN
1.0000 g | Freq: Once | INTRAVENOUS | Status: AC
  Administered 2018-01-30: 1 g via INTRAVENOUS
  Filled 2018-01-30: qty 10

## 2018-01-30 MED ORDER — SODIUM CHLORIDE 0.9 % IV SOLN
3.0000 g | Freq: Three times a day (TID) | INTRAVENOUS | Status: DC
  Administered 2018-01-30 – 2018-02-04 (×16): 3 g via INTRAVENOUS
  Filled 2018-01-30 (×19): qty 3

## 2018-01-30 MED ORDER — PREGABALIN 75 MG PO CAPS
150.0000 mg | ORAL_CAPSULE | Freq: Three times a day (TID) | ORAL | Status: DC
  Administered 2018-01-30 – 2018-02-14 (×46): 150 mg
  Filled 2018-01-30 (×46): qty 2

## 2018-01-30 MED ORDER — POTASSIUM CHLORIDE 20 MEQ/15ML (10%) PO SOLN
40.0000 meq | Freq: Once | ORAL | Status: AC
Start: 2018-01-30 — End: 2018-01-30
  Administered 2018-01-30: 40 meq via ORAL
  Filled 2018-01-30: qty 30

## 2018-01-30 MED ORDER — PREGABALIN 75 MG PO CAPS
200.0000 mg | ORAL_CAPSULE | Freq: Three times a day (TID) | ORAL | Status: DC
  Administered 2018-01-30: 200 mg via ORAL
  Filled 2018-01-30: qty 1

## 2018-01-30 MED ORDER — PHENOBARBITAL SODIUM 130 MG/ML IJ SOLN
800.0000 mg | Freq: Once | INTRAMUSCULAR | Status: AC
  Administered 2018-01-30: 800 mg via INTRAVENOUS
  Filled 2018-01-30: qty 6.15

## 2018-01-30 MED ORDER — PHENOBARBITAL SODIUM 65 MG/ML IJ SOLN
1.0000 mg/kg | Freq: Two times a day (BID) | INTRAMUSCULAR | Status: DC
  Administered 2018-01-30 – 2018-02-06 (×14): 39.65 mg via INTRAVENOUS
  Filled 2018-01-30 (×14): qty 1

## 2018-01-30 MED ORDER — OSMOLITE 1.2 CAL PO LIQD
1000.0000 mL | ORAL | Status: DC
  Administered 2018-01-30 – 2018-02-06 (×7): 1000 mL
  Filled 2018-01-30 (×12): qty 1000

## 2018-01-30 MED ORDER — NOREPINEPHRINE 16 MG/250ML-% IV SOLN
0.0000 ug/min | INTRAVENOUS | Status: DC
Start: 2018-01-30 — End: 2018-02-05
  Administered 2018-01-30 – 2018-01-31 (×4): 30 ug/min via INTRAVENOUS
  Administered 2018-02-01: 22 ug/min via INTRAVENOUS
  Administered 2018-02-01: 30 ug/min via INTRAVENOUS
  Administered 2018-02-02: 12 ug/min via INTRAVENOUS
  Administered 2018-02-04: 8 ug/min via INTRAVENOUS
  Filled 2018-01-30 (×11): qty 250

## 2018-01-30 NOTE — Procedures (Signed)
LTM-EEG Report reading: Jolene Schimke, MD  HISTORY: Continuous video-EEG monitoring performed for 67 year old with breakthrough seizures.  ACQUISITION: International 10-20 system for electrode placement; 18 channels with additional eyes linked to ipsilateral ears and EKG. Additional T1-T2 electrodes were used. Continuous video recording obtained.   MEDICATIONS: as per EMR  Day #2 Recording begins 01/29/2018 at 7:30 AM Recording ends 01/30/2018 at 7:55 AM CPT 95951  DAY 2: Background activities marked by disorganized continues background activity slowing in theta and delta range with stage changes.  More prominent slowing present across right hemisphere distributed broadly in the delta range ranging between 1.5 to 3.5 cps.  Superimposed right anterior frontal and right paracentral/central parietal spikes was brought negative field present throughout the recording.  No electrographic seizures present with electrographic onset across the right anterior frontal and frontoparietal paracentral region and electrographically marked by buildup of evolving spike and wave discharges across initially right anterior frontal and frontal central/central parietal region gradually involving adjacent right hemispheric cortex.  Given the proximity to midline negative feel tend to extend to the left hemisphere however ictal pattern lateralized to the right hemisphere throughout the electrographic seizure.  Last electrographic seizure noted around 12 noon.  There is no obvious clinical accompaniment noted however camera is suboptimally positioned so patient face is not visible.  However it appears that had his turn to the right.  After cessation of ictal activities the background activity is marked by  continuous delta slowing with more prominent delta slowing across right hemisphere.  Superimposed ,  frequent spikes across right hemisphere again with maximum negativity in anterior and paracentral right hemispheric cortex  region present throughout the recording.  At times occurring periodic fashion suggestive of periodic lateralized epileptiform discharges on the right.  However no electrographic seizures present.  Call interpretation: This is day 2 of intensive EEG monitoring with simultaneous video monitoring is abnormal for several reasons #1 electrographic seizures arising from right anterior frontal and paracentral cortex with subsequent resolution.  Last seizure around 12 noon.  #2 continuous right hemispheric background activity slowing with superimposed right anterior frontal and anterior paracentral spike still present suggestive of neuronal dysfunction and significant cortical irritability in right anterior and paracentral cortex.  Clinical correlation is advised.

## 2018-01-30 NOTE — Procedures (Signed)
Indication: seizures  Risks of the procedure were dicussed with the patient including post-LP headache, bleeding, infection, weakness/numbness of legs(radiculopathy), death.  The patient's proxy agreed and written consent was obtained.   The patient was prepped and draped, and using sterile technique a 20 gauge quinke spinal needle was inserted in the L4- L5, but no CSF was obtained despite multiple passes then L3-L4 space was identified and after multiple passes  With bony resistance clear CSF was obtained. The opening pressure was 18 cc H2O. Approximately 8 cc of CSF were obtained and sent for analysis.  Multiple attempts were made prior to obtaining CSF.  No complications were encountered  Laurey Morale, MSN, NP-C [facility] [phone]

## 2018-01-30 NOTE — Progress Notes (Signed)
LTM EEG checked, paste added to Cz, Fz, Fp1, Fp2, C3, C4, T3, T5 no skin breakdown noted.

## 2018-01-30 NOTE — Progress Notes (Signed)
Norepinephrine drip increased to 40 mcg/min at 1508. Monitoring continuously.

## 2018-01-30 NOTE — Progress Notes (Signed)
Patient on norepinephrine while in radiology for MRI. Will continue to monitor closely for effectiveness. Phenylephrine on hold d/t MRI pump availability.

## 2018-01-30 NOTE — Progress Notes (Signed)
Received report and transported patient to radiology department for MRI. Assumed care of patient and monitored patient throughout transport. Patient placed on MRI compatible IV pumps with versed and norepi drips running continuously.

## 2018-01-30 NOTE — Progress Notes (Signed)
Initial Nutrition Assessment  DOCUMENTATION CODES:   Severe malnutrition in context of chronic illness, Underweight  INTERVENTION:   Osmolite 1.2 @ 50 ml/hr (1200 ml/day) via NG tube  Provides: 1440 kcal, 67 grams protein, and 973 ml free water  Monitor magnesium, potassium, and phosphorus daily every 12 hours MD to replete as needed, as pt is at risk for refeeding syndrome given severe malnutrition.  NUTRITION DIAGNOSIS:   Severe Malnutrition related to chronic illness as evidenced by severe muscle depletion, severe fat depletion.  GOAL:   Patient will meet greater than or equal to 90% of their needs  MONITOR:   Vent status, I & O's, TF tolerance  REASON FOR ASSESSMENT:   Consult, Ventilator Enteral/tube feeding initiation and management  ASSESSMENT:   Pt with PMH of Sz, depression, GERD, insomnia, osteoporosis admitted 6/23 s/p seizure.    Pt discussed during ICU rounds and with RN.  Pt on high-dose versed, pressor support, and continuous EEG  Per husband pt lost a lot of weight 2-3 years ago after a serious injury, had replacement of hip at [facility]. At her heaviest she was 110 lb but is now about 80 lb. Weight stable recently. She drinks 2 Boost per day and nibbles the rest of the day with no certain meal times. Pt had two teeth pulled recently and is having trouble chewing and poor appetite  Patient is currently intubated on ventilator support MV: 8 L/min Temp (24hrs), Avg:101.2 F (38.4 C), Min:100 F (37.8 C), Max:102.4 F (39.1 C)   Medications reviewed and include: SSI Levophed @ 30 mcg Neosynephrine @ 25 mcg  Labs reviewed: K+ 3.3 (L)    NUTRITION - FOCUSED PHYSICAL EXAM:    Most Recent Value  Orbital Region  Severe depletion  Upper Arm Region  Severe depletion  Thoracic and Lumbar Region  Severe depletion  Buccal Region  Unable to assess  Temple Region  Severe depletion  Clavicle Bone Region  Severe depletion  Clavicle and Acromion Bone Region   Severe depletion  Scapular Bone Region  Unable to assess  Dorsal Hand  Unable to assess  Patellar Region  Moderate depletion  Anterior Thigh Region  Moderate depletion  Posterior Calf Region  Moderate depletion  Edema (RD Assessment)  None  Hair  Reviewed  Eyes  Unable to assess  Mouth  Unable to assess  Skin  Reviewed  Nails  Reviewed       Diet Order:   Diet Order           Diet NPO time specified  Diet effective now          EDUCATION NEEDS:   No education needs have been identified at this time  Skin:  Skin Assessment: Reviewed RN Assessment  Last BM:  unknown  Height:   Ht Readings from Last 1 Encounters:  01/28/18 5\' 2"  (1.575 m)    Weight:   Wt Readings from Last 1 Encounters:  01/30/18 87 lb 8.4 oz (39.7 kg)    Ideal Body Weight:  50 kg  BMI:  Body mass index is 16.01 kg/m.  Estimated Nutritional Needs:   Kcal:  1425  Protein:  60-75 grams  Fluid:  > 1.5 L/day  Maylon Peppers RD, LDN, CNSC [phone] Pager [phone] After Hours Pager

## 2018-01-30 NOTE — Progress Notes (Signed)
Pharmacy Antibiotic Note  Samantha Atkins is a 67 y.o. female admitted on 01/28/2018 with aspiration PNA.  Pharmacy has been consulted for Unasyn dosing. Tmax/24h 102.4, WBC up to 17.2. SCr 0.51 stable, CrCl~40-45.  Plan: Unasyn 3g IV q8h Monitor clinical progress, c/s, renal function F/u de-escalation plan/LOT  Height: 5\' 2"  (157.5 cm) Weight: 87 lb 8.4 oz (39.7 kg) IBW/kg (Calculated) : 50.1  Temp (24hrs), Avg:100.7 F (38.2 C), Min:99 F (37.2 C), Max:102.4 F (39.1 C)  Recent Labs  Lab 01/28/18 2322 01/28/18 2331 01/29/18 0038 01/29/18 0311 01/29/18 0947 01/30/18 0500  WBC 14.3*  --   --   --   --  17.2*  CREATININE 0.89  --   --   --   --  0.51  LATICACIDVEN  --  8.89* 3.7* 2.5* 1.7  --     Estimated Creatinine Clearance: 43.4 mL/min (by C-G formula based on SCr of 0.51 mg/dL).    Allergies  Allergen Reactions  . Dust Mite Extract Cough   Elicia Lamp, PharmD, BCPS Clinical Pharmacist Clinical phone for 01/30/2018 until 3:30pm: [phone] If after 3:30pm, please call main pharmacy at: [phone] 01/30/2018 8:30 AM

## 2018-01-30 NOTE — Progress Notes (Signed)
Transported patient back to 4N32 from radiology department. Bedside nurse resumed care at this time.

## 2018-01-30 NOTE — Procedures (Signed)
Permission obtained from the patient's husband and major benefits and risks expalined. The patient was draped and the left neck exposed. The area was cleansed with chlorhexidine. The patient was administered versed 3 ng and later 1 mg Ativan IV for sedation. The left Ij was localized but it was difficult to cannulate and quite collapsible. After multiple errant attempts we decided to place the line in the groin. The area in the right groin was draped and cleaned.  The vein was found by palpation and there was a clear palpable poulse oin the femoral artery. The vein was cannulated on the tird attempt and the catheter placed using a modified Seldinger tachnique. Total blood loss about 15-20 cc between the two sites. The line had an excellent blood return. It was sutured in place and dressed. CXR pending post-attempt.

## 2018-01-30 NOTE — Progress Notes (Signed)
Pt transported to and from MRI on full support and 100% FIO2.  Pt stable during transport and procedure.

## 2018-01-30 NOTE — Progress Notes (Addendum)
PULMONARY / CRITICAL CARE MEDICINE   Name: Samantha Atkins MRN: [id number] DOB: 08-Feb-1951    ADMISSION DATE:  01/28/2018 CONSULTATION DATE: 01/29/2018     REFERRING MD:  Dr. Venora Maples   CHIEF COMPLAINT:  Seizure   HISTORY OF PRESENT ILLNESS:   67 year old female with PMH of Seizure, Osteoporosis, Depression, GERD, Insomnia  Presents to ED on 6/23 s/p seizure. Per Husband patient was found seizing and unresponsive. When EMS arrived patient was foaming at the mouth and was given 5 mg Versed. On arrival to ED patient continued to have seizures requiring 2 mg Ativan. Head CT negative. Intubated for airway protection. Given 1 mg Keppra. Neurology consulted. PCCM asked to admit.   SUBJECTIVE:  High-dose Versed at 20 mg Right-sided irritably EEG Requiring pressor support due to high sedation needs  VITAL SIGNS: BP 96/65   Pulse 89   Temp (!) 102 F (38.9 C)   Resp 20   Ht 5\' 2"  (1.575 m)   Wt 39.7 kg (87 lb 8.4 oz)   SpO2 100%   BMI 16.01 kg/m   HEMODYNAMICS:    VENTILATOR SETTINGS: Vent Mode: PRVC FiO2 (%):  [30 %-40 %] 30 % Set Rate:  [20 bmp] 20 bmp Vt Set:  [400 mL] 400 mL PEEP:  [5 cmH20] 5 cmH20 Plateau Pressure:  [13 cmH20-15 cmH20] 15 cmH20  INTAKE / OUTPUT: I/O last 3 completed shifts: In: 8938 [I.V.:6875.8; NG/GT:100; IV Piggyback:754.2] Out: 1940 [BOFBP:1025; Emesis/NG output:200]  PHYSICAL EXAMINATION: General: Frail elderly female who is heavily sedated on mechanical dilatory support HEENT: Endotracheal tube and OG tube in place, pupils pinpoint reactive Neuro: Moves right side spontaneously, heavily sedated with 20 mg of Versed CV: Heart sounds are regular PULM: even/non-labored, lungs bilaterally diminished bibasilar EN:IDPO, non-tender, bsx4 active  Extremities: warm/dry, negative edema, noted to be developing foot drop Skin: no rashes or lesions   LABS:  BMET Recent Labs  Lab 01/28/18 2322  NA 140  K 3.3*  CL 104  CO2 18*  BUN 17  CREATININE  0.89  GLUCOSE 229*    Electrolytes Recent Labs  Lab 01/28/18 2322 01/29/18 0311  CALCIUM 9.3  --   MG  --  1.9    CBC Recent Labs  Lab 01/28/18 2322  WBC 14.3*  HGB 14.1  HCT 42.0  PLT 312    Coag's Recent Labs  Lab 01/28/18 2322  INR 1.06    Sepsis Markers Recent Labs  Lab 01/29/18 0038 01/29/18 0039 01/29/18 0311 01/29/18 0947 01/29/18 1823  LATICACIDVEN 3.7*  --  2.5* 1.7  --   PROCALCITON  --  0.32  --  2.16 1.71    ABG Recent Labs  Lab 01/29/18 0004 01/29/18 0358  PHART 7.310* 7.354  PCO2ART 49.5* 32.6  PO2ART 514.0* 155.0*    Liver Enzymes Recent Labs  Lab 01/28/18 2322  AST 46*  ALT 43  ALKPHOS 93  BILITOT 0.3  ALBUMIN 3.8    Cardiac Enzymes No results for input(s): TROPONINI, PROBNP in the last 168 hours.  Glucose Recent Labs  Lab 01/29/18 0843 01/29/18 1309 01/29/18 1634 01/29/18 2026 01/30/18 0006 01/30/18 0445  GLUCAP 132* 101* 96 129* 98 112*    Imaging Dg Chest Port 1 View  Result Date: 01/29/2018 CLINICAL DATA:  Central line placement.  Fever. EXAM: PORTABLE CHEST 1 VIEW COMPARISON:  01/28/2018 FINDINGS: Endotracheal and NG tubes are stable. Increased bibasilar atelectasis versus airspace disease right greater than left. Normal heart size. Upper lungs clear.  There are no new central venous catheters. IMPRESSION: Increased bibasilar atelectasis versus airspace disease right greater than left. Electronically Signed   By: Marybelle Killings M.D.   On: 01/29/2018 19:42     STUDIES:  CXR 6/24 > Normal sized heart. Tortuous and calcified thoracic aorta. Endotracheal tube tip 3.6 cm above the carina. Nasogastric tube extending into the stomach. Bilateral nipple shadows. Clear lungs. Proximal left humerus fixation hardware. CT Head 6/24 > No acute abnormality. 2. Stable diffuse cerebral and cerebellar atrophy. 3. Stable chronic small vessel white matter ischemic changes in both cerebral hemispheres.  24 2019 EEG disc  demonstrates right-sided air stability despite being on Versed  CULTURES: Blood 6/24 >> Urine 6/24 >>  ANTIBIOTICS: None.   SIGNIFICANT EVENTS: 6/23 > Presents to ED   LINES/TUBES: ETT 6/24 >>  DISCUSSION: 67 year old female who is currently on 20 mg of Versed for seizure activity not weanable at this time.  Her white count is continued to climb, she has a fever 102, no positive culture data at this time.  We will give consideration of starting antibiotics as she continues to trend.  Note her chest x-ray shows some bibasilar airspace disease there is a possibility she has aspirated somewhere along the line.  ASSESSMENT / PLAN:  Respiratory Insufficieny s/p Seizure  P:   Full vent support Due to heavy sedation Questionable aspiration event, 01/30/2018 we will add Unasyn   Hypotension in setting of ?sepsis vs medication effect - SBP 90's at baseline  P:  Continue cardiac monitoring Levophed and Neo-Synephrine for blood pressure support as she is on Versed 20 mg  Continue to monitor for infection, start Unasyn  Anion Gap Metabolic Acidosis with Lactic Acidosis  Hypokalemia  Recent Labs  Lab 01/28/18 2322 01/30/18 0500  K 3.3* 3.3*    P:   Follow electrolytes Replete electrolytes as needed   Malnutrition  SUP P:   N.p.o. PPI Start tube feedings 01/30/2018  Normocytic Anemia  Recent Labs    01/28/18 2322 01/30/18 0500  HGB 14.1 11.8*    VTE  P:  Transfuse per protocol Trend CBC  Leukocytosis  suspected secondary to seizure)  P:   Follow cultures no growth to date 01/30/2018 Procalcitonin 1.71 01/30/2018 white count 17.2  fever 102 ?from infection versus neurogenic 01/30/2018 we will check sputum culture 01/30/2018 we will start Unasyn  Hyperglycemia    CBG (last 3)  Recent Labs    01/29/18 2026 01/30/18 0006 01/30/18 0445  GLUCAP 129* 98 112*    P:   Sliding scale insulin protocol Cortisol 28.1 01/29/2018 at 0042    Encephalopathy in  setting of Status Epilepticus vs Postictal state - improved.  H/O Multiple Falls, Osteo, Seizure on Previous admission 02/2017, Depression  Increased seizure activity 01/29/2018 P:   Currently on Versed at 20 mg an hour per neurology RA SS goal 0-1 Currently with continuous EEG Currently on Keppra per neurology Note urine was positive for benzos and tetrahydrocannabinol    FAMILY  - Updates: 01/30/2018 no family bedside at time of NP rounds  - Inter-disciplinary family meet or Palliative Care meeting due by: 02/05/2018   - App CCT 30 min   Richardson Landry Austan Nicholl ACNP Maryanna Shape PCCM Pager [phone] till 1 pm If no answer page [phone] 01/30/2018, 8:00 AM

## 2018-01-30 NOTE — Progress Notes (Signed)
Subjective: Yesterday, began having frequent seizures in the late morning, became essentially status epilepticus that broke with ativan and phenytoin.   Following this, she continued to have periodic discharges, but no evolution. She was treated with AEDs, but in the evening began having  worsening appearance with more organization and therefore was started on midazolam.   Exam: Vitals:   01/30/18 0715 01/30/18 0730  BP: 100/66 96/65  Pulse: 87 89  Resp: 20 20  Temp: (!) 102.2 F (39 C) (!) 102 F (38.9 C)  SpO2: 100% 100%   Gen: In bed, intubated Resp: ventilated Abd: soft, nt  Limited by versed drip Neuro: MS: Sedated, does not open eyes CN: PERRL Motor: minimal flexion vs withdrawal to nox stim Sensory: as above  Pertinent Labs: BMP - pending, last Cr 0.8 Procalcitonin 1.71  Impression: 67 yo F with breakthrough status epilepticus with unclear cause of breakthrough. She initially was controlled with ativan and keppra, but subsequently re-developed status epilepticus.    Recommendations: 1) Continue LTM-EEG 2) continue keppra at 1500mg  BID 3) Continue lyrica 200mg  TID 4) Continue phenytoin 100mg  TID, level this am 5) will continue to follow.   This patient is critically ill and at significant risk of neurological worsening, death and care requires constant monitoring of vital signs, hemodynamics,respiratory and cardiac monitoring, neurological assessment, discussion with family, other specialists and medical decision making of high complexity. I spent 45 minutes of neurocritical care time  in the care of  this patient.  Roland Rack, MD [facility] [phone]  If 7pm- 7am, please page neurology on call as listed in [place]. 01/30/2018  8:10 AM

## 2018-01-31 ENCOUNTER — Inpatient Hospital Stay (HOSPITAL_COMMUNITY): Payer: Commercial Managed Care - PPO

## 2018-01-31 LAB — MAGNESIUM: MAGNESIUM: 1.6 mg/dL — AB (ref 1.7–2.4)

## 2018-01-31 LAB — BASIC METABOLIC PANEL
ANION GAP: 4 — AB (ref 5–15)
BUN: 7 mg/dL — AB (ref 8–23)
CHLORIDE: 112 mmol/L — AB (ref 98–111)
CO2: 21 mmol/L — ABNORMAL LOW (ref 22–32)
Calcium: 7.6 mg/dL — ABNORMAL LOW (ref 8.9–10.3)
Creatinine, Ser: 0.51 mg/dL (ref 0.44–1.00)
GFR calc Af Amer: >60 mL/min (ref >60)
GFR calc non Af Amer: >60 mL/min (ref >60)
Glucose, Bld: 143 mg/dL — ABNORMAL HIGH (ref 70–99)
POTASSIUM: 3.5 mmol/L (ref 3.5–5.1)
SODIUM: 137 mmol/L (ref 135–145)

## 2018-01-31 LAB — CBC WITH DIFFERENTIAL/PLATELET
ABS IMMATURE GRANULOCYTES: 0 10*3/uL (ref 0.0–0.1)
Basophils Absolute: 0 10*3/uL (ref 0.0–0.1)
Basophils Relative: 0 %
Eosinophils Absolute: 0.1 10*3/uL (ref 0.0–0.7)
Eosinophils Relative: 1 %
HCT: 34.2 % — ABNORMAL LOW (ref 36.0–46.0)
HEMOGLOBIN: 11 g/dL — AB (ref 12.0–15.0)
Immature Granulocytes: 0 %
Lymphocytes Relative: 14 %
Lymphs Abs: 1.4 10*3/uL (ref 0.7–4.0)
MCH: 33.7 pg (ref 26.0–34.0)
MCHC: 32.2 g/dL (ref 30.0–36.0)
MCV: 104.9 fL — ABNORMAL HIGH (ref 78.0–100.0)
MONO ABS: 0.9 10*3/uL (ref 0.1–1.0)
MONOS PCT: 9 %
NEUTROS ABS: 7.4 10*3/uL (ref 1.7–7.7)
Neutrophils Relative %: 76 %
Platelets: 206 10*3/uL (ref 150–400)
RBC: 3.26 MIL/uL — ABNORMAL LOW (ref 3.87–5.11)
RDW: 12.6 % (ref 11.5–15.5)
WBC: 9.8 10*3/uL (ref 4.0–10.5)

## 2018-01-31 LAB — PHOSPHORUS: Phosphorus: 1.5 mg/dL — ABNORMAL LOW (ref 2.5–4.6)

## 2018-01-31 LAB — GLUCOSE, CAPILLARY
GLUCOSE-CAPILLARY: 128 mg/dL — AB (ref 70–99)
GLUCOSE-CAPILLARY: 104 mg/dL — AB (ref 70–99)
Glucose-Capillary: 103 mg/dL — ABNORMAL HIGH (ref 70–99)
Glucose-Capillary: 159 mg/dL — ABNORMAL HIGH (ref 70–99)
Glucose-Capillary: 137 mg/dL — ABNORMAL HIGH (ref 70–99)

## 2018-01-31 LAB — PHENOBARBITAL LEVEL: PHENOBARBITAL: 22 ug/mL (ref 15.0–30.0)

## 2018-01-31 LAB — HERPES SIMPLEX VIRUS(HSV) DNA BY PCR
HSV 1 DNA: NEGATIVE
HSV 2 DNA: NEGATIVE

## 2018-01-31 LAB — PHENYTOIN LEVEL, TOTAL: PHENYTOIN LVL: 9.7 ug/mL — AB (ref 10.0–20.0)

## 2018-01-31 LAB — PROCALCITONIN: Procalcitonin: 1.1 ng/mL

## 2018-01-31 MED ORDER — SODIUM CHLORIDE 0.9 % IV SOLN
200.0000 mg | Freq: Once | INTRAVENOUS | Status: AC
  Administered 2018-01-31: 200 mg via INTRAVENOUS
  Filled 2018-01-31: qty 4

## 2018-01-31 MED ORDER — HEPARIN SODIUM (PORCINE) 5000 UNIT/ML IJ SOLN
5000.0000 [IU] | Freq: Three times a day (TID) | INTRAMUSCULAR | Status: DC
  Administered 2018-01-31 – 2018-02-14 (×43): 5000 [IU] via SUBCUTANEOUS
  Filled 2018-01-31 (×44): qty 1

## 2018-01-31 MED ORDER — DEXTROSE 5 % IV SOLN
30.0000 mmol | Freq: Once | INTRAVENOUS | Status: AC
  Administered 2018-01-31: 30 mmol via INTRAVENOUS
  Filled 2018-01-31: qty 10

## 2018-01-31 MED ORDER — SODIUM CHLORIDE 0.9 % IV SOLN
125.0000 mg | Freq: Three times a day (TID) | INTRAVENOUS | Status: DC
  Administered 2018-01-31 – 2018-02-01 (×3): 125 mg via INTRAVENOUS
  Filled 2018-01-31 (×4): qty 2.5

## 2018-01-31 MED ORDER — MAGNESIUM SULFATE 2 GM/50ML IV SOLN
2.0000 g | Freq: Once | INTRAVENOUS | Status: AC
  Administered 2018-01-31: 2 g via INTRAVENOUS
  Filled 2018-01-31: qty 50

## 2018-01-31 MED ORDER — DEXTROSE 50 % IV SOLN
INTRAVENOUS | Status: AC
Start: 2018-01-31 — End: 2018-02-01
  Administered 2018-02-01: 25 mL
  Filled 2018-01-31: qty 50

## 2018-01-31 NOTE — Progress Notes (Signed)
PULMONARY / CRITICAL CARE MEDICINE   Name: Samantha Atkins MRN: [id number] DOB: April 16, 1951    ADMISSION DATE:  01/28/2018 CONSULTATION DATE: 01/29/2018     REFERRING MD:  Dr. Venora Maples   CHIEF COMPLAINT:  Seizure   HISTORY OF PRESENT ILLNESS:   67 year old female with PMH of Seizure, Osteoporosis, Depression, GERD, Insomnia  Presents to ED on 6/23 s/p seizure. Per Husband patient was found seizing and unresponsive. When EMS arrived patient was foaming at the mouth and was given 5 mg Versed. On arrival to ED patient continued to have seizures requiring 2 mg Ativan. Head CT negative. Intubated for airway protection. Given 1 mg Keppra. Neurology consulted. PCCM asked to admit.   SUBJECTIVE:  No sig change.  Remains on pressors Ongoing EEG  Remains on high dose versed gtt, fent gtt   VITAL SIGNS: BP 121/76   Pulse 76   Temp 97.7 F (36.5 C)   Resp 20   Ht 5\' 2"  (1.575 m)   Wt 43.2 kg (95 lb 3.8 oz)   SpO2 100%   BMI 17.42 kg/m   HEMODYNAMICS:    VENTILATOR SETTINGS: Vent Mode: PRVC FiO2 (%):  [30 %-40 %] 30 % Set Rate:  [20 bmp] 20 bmp Vt Set:  [400 mL] 400 mL PEEP:  [5 cmH20] 5 cmH20 Plateau Pressure:  [14 cmH20-16 cmH20] 16 cmH20  INTAKE / OUTPUT: I/O last 3 completed shifts: In: 8908.6 [I.V.:7871.8; NG/GT:250; IV Piggyback:786.8] Out: 2003 [Urine:2003]  PHYSICAL EXAMINATION: General:  Thin, frail elderly female, NAD on vent  HEENT: MM pink/moist, ETT Neuro: sedated, RASS -3, heavy sedation for burst suppression  CV: s1s2 rrr, no m/r/g PULM: even/non-labored on vent, few scattered rhonchi  TD:VVOH, non-tender, bsx4 active  Extremities: warm/dry, no sig edema  Skin: no rashes or lesions    LABS:  BMET Recent Labs  Lab 01/28/18 2322 01/30/18 0500 01/31/18 0459  NA 140 136 137  K 3.3* 3.3* 3.5  CL 104 112* 112*  CO2 18* 19* 21*  BUN 17 6* 7*  CREATININE 0.89 0.51 0.51  GLUCOSE 229* 165* 143*    Electrolytes Recent Labs  Lab 01/28/18 2322  01/29/18 0311 01/30/18 0500 01/30/18 1711 01/31/18 0459  CALCIUM 9.3  --  7.6*  --  7.6*  MG  --  1.9  --  1.6* 1.6*  PHOS  --   --   --  1.9* 1.5*    CBC Recent Labs  Lab 01/28/18 2322 01/30/18 0500 01/31/18 0459  WBC 14.3* 17.2* 9.8  HGB 14.1 11.8* 11.0*  HCT 42.0 36.0 34.2*  PLT 312 219 206    Coag's Recent Labs  Lab 01/28/18 2322  INR 1.06    Sepsis Markers Recent Labs  Lab 01/29/18 0038  01/29/18 0311 01/29/18 0947 01/29/18 1823 01/30/18 0500 01/31/18 0459  LATICACIDVEN 3.7*  --  2.5* 1.7  --   --   --   PROCALCITON  --    < >  --  2.16 1.71 1.26 1.10   < > = values in this interval not displayed.    ABG Recent Labs  Lab 01/29/18 0004 01/29/18 0358  PHART 7.310* 7.354  PCO2ART 49.5* 32.6  PO2ART 514.0* 155.0*    Liver Enzymes Recent Labs  Lab 01/28/18 2322  AST 46*  ALT 43  ALKPHOS 93  BILITOT 0.3  ALBUMIN 3.8    Cardiac Enzymes No results for input(s): TROPONINI, PROBNP in the last 168 hours.  Glucose Recent Labs  Lab  01/30/18 1143 01/30/18 1649 01/30/18 2028 01/30/18 2342 01/31/18 0420 01/31/18 0854  GLUCAP 81 162* 96 172* 103* 128*    Imaging Mr Brain Wo Contrast  Result Date: 01/30/2018 CLINICAL DATA:  Status epilepticus. EXAM: MRI HEAD WITHOUT CONTRAST TECHNIQUE: Multiplanar, multiecho pulse sequences of the brain and surrounding structures were obtained without intravenous contrast. COMPARISON:  CT 01/28/2018.  MRI 02/15/2017. FINDINGS: Brain: Diffusion imaging does not show any acute or subacute infarction. There is cerebellar atrophy without focal finding. Brainstem is normal. Cerebral hemispheres show moderate generalized atrophy. There are scattered small foci of T2 and FLAIR signal within the deep and subcortical white matter most consistent with small vessel change. No cortical or large vessel territory insult. Mesial temporal lobes appear symmetric without focal lesion. No mass, hemorrhage, hydrocephalus or  extra-axial collection. Vascular: Major vessels at the base of the brain show flow. Skull and upper cervical spine: Negative Sinuses/Orbits: Clear/normal Other: None IMPRESSION: No acute or reversible finding. Similar appearance to the study of last year. Moderate brain atrophy. Scattered foci of T2 and FLAIR signal within the white matter most consistent with small vessel disease. No specific finding to explain seizures. Electronically Signed   By: Nelson Chimes M.D.   On: 01/30/2018 15:37   Dg Chest Port 1 View  Result Date: 01/31/2018 CLINICAL DATA:  Respiratory failure EXAM: PORTABLE CHEST 1 VIEW COMPARISON:  01/30/2018 FINDINGS: Cardiac shadow is stable. Endotracheal tube is now noted just above the carina. Nasogastric catheter is noted within the stomach. Slight increase in right-sided pleural effusion is noted. Mild bibasilar atelectatic changes are again seen. IMPRESSION: Increasing right-sided effusion.  Bibasilar atelectasis remains. Electronically Signed   By: Inez Catalina M.D.   On: 01/31/2018 08:31     STUDIES:  CXR 6/24 > Normal sized heart. Tortuous and calcified thoracic aorta. Endotracheal tube tip 3.6 cm above the carina. Nasogastric tube extending into the stomach. Bilateral nipple shadows. Clear lungs. Proximal left humerus fixation hardware. CT Head 6/24 > No acute abnormality. 2. Stable diffuse cerebral and cerebellar atrophy. 3. Stable chronic small vessel white matter ischemic changes in both cerebral hemispheres. LT EEG 6/25>>> This is day 2 of intensive EEG monitoring with simultaneous video monitoring is abnormal for several reasons #1 electrographic seizures arising from right anterior frontal and paracentral cortex with subsequent resolution.  Last seizure around 12 noon.  #2 continuous right hemispheric background activity slowing with superimposed right anterior frontal and anterior paracentral spike still present suggestive of neuronal dysfunction and significant  cortical irritability in right anterior and paracentral cortex.    CULTURES: Blood 6/24 >> Urine 6/24 >> LP 6/25>>> Sputum 6/25>>>  ANTIBIOTICS: unasyn 6/25>>>  SIGNIFICANT EVENTS: 6/23 > Presents to ED  6/25 status epilepticus requiring burst suppression   LINES/TUBES: ETT 6/24 >> R fem CVL 6/24>>>  DISCUSSION: 67yo female with hx seizure disorder admitted with AMS requiring intubation and seizures which were initially controlled. Developed ongoing status 6/25, remains heavily sedated for burst suppression.  Course c/b ?aspiration    ASSESSMENT / PLAN:  Respiratory Insufficieny s/p Seizure  ?aspiration even 6/25 P:   Vent support - 8cc/kg  F/u CXR  F/u ABG SBT once able to back off on sedation  unasyn as below     Hypotension in setting of ?sepsis vs medication effect - SBP 90's at baseline  P:  Continue levophed as needed to maintain MAP >60 unasyn as above  Lactate cleared  Trend pct  Gentle volume    Anion Gap Metabolic  Acidosis with Lactic Acidosis  Hypokalemia  HypoMg  hypoPhos P:   Mg, k-phos today  F/u chem  Monitor UOP    Malnutrition  SUP P:   N.p.o. PPI Continue TF   Normocytic Anemia  P:  F/u CBC  SCD's  Add SQ heparin for DVT proph  Leukocytosis  suspected secondary to seizure ??aspiration PNA  Fever  P:   Follow cultures  Trend pct, wbc  Continue unasyn as above    Hyperglycemia    P:   SSI     Encephalopathy in setting of Status Epilepticus H/O Multiple Falls, Osteo, Seizure on Previous admission 02/2017, Depression  Status epilepticus  P:   Neuro following  Burst suppression per neuro - currently on versed 15mg /hr  Fentanyl gtt  Continuous EEG per neuro  Currently on Versed at 20 mg an hour per neurology Continue keppra, fosphenytoin, dilantin, phenobarb per neuro  Note urine was positive for benzos and tetrahydrocannabinol   FAMILY  - Updates: no family at bedside on NP rounds 6/26 - Inter-disciplinary  family meet or Palliative Care meeting due by: 02/05/2018    Nickolas Madrid, NP 01/31/2018  9:14 AM Pager: [phone] or [phone]

## 2018-01-31 NOTE — Progress Notes (Signed)
Subjective: EEG improved greatly following phenobarbital addition.   Exam: Vitals:   01/31/18 0741 01/31/18 0742  BP: 121/76   Pulse: 76   Resp: 20   Temp: 97.7 F (36.5 C)   SpO2: 100% 100%   Gen: In bed, intubated Resp: ventilated Abd: soft, nt  Limited by versed drip Neuro: MS: Sedated, does not open eyes CN: PERRL Motor: minimal flexion vs withdrawal to nox stim Sensory: as above  Pertinent Labs: PHB 22 PHT 9.7  Impression: 67 yo F with breakthrough status epilepticus with unclear cause of breakthrough. She initially was controlled with ativan and keppra, but subsequently re-developed status epilepticus. She had cessation of seizure with ativan, but continued with PLEDs until addition of phenobarbital.    Recommendations: 1) Continue LTM-EEG 2) continue keppra at 1500mg  BID 3) Continue lyrica 200mg  TID 4) additional dose of phenytoin, may be decreased due to phenobarb 5) increase dose of PHT to 125mg  TID.  6) Continue PHB 1mg /kg BID 7) will follow   This patient is critically ill and at significant risk of neurological worsening, death and care requires constant monitoring of vital signs, hemodynamics,respiratory and cardiac monitoring, neurological assessment, discussion with family, other specialists and medical decision making of high complexity. I spent 50 minutes of neurocritical care time  in the care of  this patient.  Roland Rack, MD [facility] [phone]  If 7pm- 7am, please page neurology on call as listed in [place]. 01/31/2018  10:35 AM

## 2018-01-31 NOTE — Procedures (Signed)
LTM-EEG Report reading: Samantha Schimke, MD  HISTORY: Continuous video-EEG monitoring performed for 67 year old with breakthrough seizures.  ACQUISITION: International 10-20 system for electrode placement; 18 channels with additional eyes linked to ipsilateral ears and EKG. Additional T1-T2 electrodes were used. Continuous video recording obtained.   MEDICATIONS: as per EMR  Day #3 Recording begins 01/30/2018 at 7 55  AM Recording ends 01/31/2018 at 7:56AM CPT 95951  DAY 2: Background activities marked by disorganized continues background activity slowing in theta and delta range with stage changes.  More prominent slowing present across right hemisphere distributed broadly in the delta range ranging between 1.5 to 3.5 cps.  Superimposed right anterior frontal and right paracentral/central parietal spikes was brought negative field present throughout the recording.  No electrographic seizures present with electrographic onset across the right anterior frontal and frontoparietal paracentral region and electrographically marked by buildup of evolving spike and wave discharges across initially right anterior frontal and frontal central/central parietal region gradually involving adjacent right hemispheric cortex.  Given the proximity to midline negative feel tend to extend to the left hemisphere however ictal pattern lateralized to the right hemisphere throughout the electrographic seizure.  Last electrographic seizure noted around 12 noon.  There is no obvious clinical accompaniment noted however camera is suboptimally positioned so patient face is not visible.  However it appears that had his turn to the right.  After cessation of ictal activities the background activity is marked by  continuous delta slowing with more prominent delta slowing across right hemisphere.  Superimposed ,  frequent spikes across right hemisphere again with maximum negativity in anterior and paracentral right hemispheric cortex  region present throughout the recording.  At times occurring periodic fashion suggestive of periodic lateralized epileptiform discharges on the right.  However no electrographic seizures present.  Day 3: Frequent right hemispheric spikes as well as periodic lateralized epileptiform discharges during first couple of hours of the recording.  At times right periodic lateralized epileptiform discharges occur in near continues mildly evolving fashion.  Brief subclinical seizures cannot be completely ruled out.  With medication adjustment however there is a complete resolution of right periodic lateralized epileptiform discharges.  Throughout the remaining of the recording rare right anterior frontal sharp waves and spikes still present suggestive of cortical irritability in that region.  In addition there is a continuous right hemispheric slowing present suggestive of neuronal dysfunction across right hemisphere.  Occasionally there is a left posterior temporal/parietal sharp waves and spikes present suggestive of independent regional irritable cortex in the left posterior temporal/parietal region.  No seizures however present.  Call interpretation: This day 3 of intensive EEG monitoring with simultaneous video monitoring demonstrated significant improvement and resolution of right hemispheric periodic lateralized epileptiform discharges and brief electrographic seizures. EEG however is abnormal due to #1 persistent right hemispheric delta slowing and occasional right anterior frontal sharp waves still suggestive of neuronal dysfunction and cortical irritability in the right anterior frontal cortex.  #2 occasionally left posterior temporal/parietal sharp waves and spikes suggestive of independent cortical irritability in that region.  Clinical correlation is advised

## 2018-01-31 NOTE — Progress Notes (Signed)
LTM EEG checked this morning, conductive paste was added to many electrodes. No skin breakdown noted.

## 2018-02-01 ENCOUNTER — Inpatient Hospital Stay (HOSPITAL_COMMUNITY): Payer: Commercial Managed Care - PPO

## 2018-02-01 LAB — CBC
HCT: 33.2 % — ABNORMAL LOW (ref 36.0–46.0)
Hemoglobin: 10.9 g/dL — ABNORMAL LOW (ref 12.0–15.0)
MCH: 34 pg (ref 26.0–34.0)
MCHC: 32.8 g/dL (ref 30.0–36.0)
MCV: 103.4 fL — ABNORMAL HIGH (ref 78.0–100.0)
Platelets: 183 10*3/uL (ref 150–400)
RBC: 3.21 MIL/uL — AB (ref 3.87–5.11)
RDW: 12.4 % (ref 11.5–15.5)
WBC: 8.6 10*3/uL (ref 4.0–10.5)

## 2018-02-01 LAB — GLUCOSE, CAPILLARY
GLUCOSE-CAPILLARY: 109 mg/dL — AB (ref 70–99)
GLUCOSE-CAPILLARY: 110 mg/dL — AB (ref 70–99)
GLUCOSE-CAPILLARY: 113 mg/dL — AB (ref 70–99)
GLUCOSE-CAPILLARY: 79 mg/dL (ref 70–99)
Glucose-Capillary: 104 mg/dL — ABNORMAL HIGH (ref 70–99)
Glucose-Capillary: 130 mg/dL — ABNORMAL HIGH (ref 70–99)
Glucose-Capillary: 58 mg/dL — ABNORMAL LOW (ref 70–99)

## 2018-02-01 LAB — BASIC METABOLIC PANEL
Anion gap: 3 — ABNORMAL LOW (ref 5–15)
BUN: 5 mg/dL — ABNORMAL LOW (ref 8–23)
CO2: 22 mmol/L (ref 22–32)
CREATININE: 0.42 mg/dL — AB (ref 0.44–1.00)
Calcium: 7.1 mg/dL — ABNORMAL LOW (ref 8.9–10.3)
Chloride: 110 mmol/L (ref 98–111)
Glucose, Bld: 125 mg/dL — ABNORMAL HIGH (ref 70–99)
Potassium: 3.7 mmol/L (ref 3.5–5.1)
SODIUM: 135 mmol/L (ref 135–145)

## 2018-02-01 LAB — PHOSPHORUS
Phosphorus: 2.6 mg/dL (ref 2.5–4.6)
Phosphorus: 2.7 mg/dL (ref 2.5–4.6)

## 2018-02-01 LAB — PHENOBARBITAL LEVEL: PHENOBARBITAL: 18.2 ug/mL (ref 15.0–30.0)

## 2018-02-01 LAB — CULTURE, RESPIRATORY W GRAM STAIN
Culture: NORMAL
Special Requests: NORMAL

## 2018-02-01 LAB — CULTURE, RESPIRATORY

## 2018-02-01 LAB — PHENYTOIN LEVEL, TOTAL: PHENYTOIN LVL: 9.1 ug/mL — AB (ref 10.0–20.0)

## 2018-02-01 LAB — MAGNESIUM
Magnesium: 1.7 mg/dL (ref 1.7–2.4)
Magnesium: 1.8 mg/dL (ref 1.7–2.4)

## 2018-02-01 MED ORDER — SODIUM CHLORIDE 0.9 % IV SOLN
200.0000 mg | Freq: Once | INTRAVENOUS | Status: AC
  Administered 2018-02-01: 200 mg via INTRAVENOUS
  Filled 2018-02-01: qty 4

## 2018-02-01 MED ORDER — PHENYTOIN SODIUM 50 MG/ML IJ SOLN
150.0000 mg | Freq: Three times a day (TID) | INTRAMUSCULAR | Status: DC
  Administered 2018-02-01 – 2018-02-02 (×3): 150 mg via INTRAVENOUS
  Filled 2018-02-01 (×5): qty 3

## 2018-02-01 NOTE — Procedures (Signed)
LTM-EEG Report reading: Samantha Schimke, MD  HISTORY: Continuous video-EEG monitoring performed for 67 year old with breakthrough seizures.  ACQUISITION: International 10-20 system for electrode placement; 18 channels with additional eyes linked to ipsilateral ears and EKG. Additional T1-T2 electrodes were used. Continuous video recording obtained.   MEDICATIONS: as per EMR  Day #4 Recording begins 01/31/2018 at 7 55  AM Recording ends 02/01/2018 at 7:56AM CPT 95951  DAY 2: Background activities marked by disorganized continues background activity slowing in theta and delta range with stage changes.  More prominent slowing present across right hemisphere distributed broadly in the delta range ranging between 1.5 to 3.5 cps.  Superimposed right anterior frontal and right paracentral/central parietal spikes was brought negative field present throughout the recording.  No electrographic seizures present with electrographic onset across the right anterior frontal and frontoparietal paracentral region and electrographically marked by buildup of evolving spike and wave discharges across initially right anterior frontal and frontal central/central parietal region gradually involving adjacent right hemispheric cortex.  Given the proximity to midline negative feel tend to extend to the left hemisphere however ictal pattern lateralized to the right hemisphere throughout the electrographic seizure.  Last electrographic seizure noted around 12 noon.  There is no obvious clinical accompaniment noted however camera is suboptimally positioned so patient face is not visible.  However it appears that had his turn to the right.  After cessation of ictal activities the background activity is marked by  continuous delta slowing with more prominent delta slowing across right hemisphere.  Superimposed ,  frequent spikes across right hemisphere again with maximum negativity in anterior and paracentral right hemispheric cortex  region present throughout the recording.  At times occurring periodic fashion suggestive of periodic lateralized epileptiform discharges on the right.  However no electrographic seizures present.  Day 3: Frequent right hemispheric spikes as well as periodic lateralized epileptiform discharges during first couple of hours of the recording.  At times right periodic lateralized epileptiform discharges occur in near continues mildly evolving fashion.  Brief subclinical seizures cannot be completely ruled out.  With medication adjustment however there is a complete resolution of right periodic lateralized epileptiform discharges.  Throughout the remaining of the recording rare right anterior frontal sharp waves and spikes still present suggestive of cortical irritability in that region.  In addition there is a continuous right hemispheric slowing present suggestive of neuronal dysfunction across right hemisphere.  Occasionally there is a left posterior temporal/parietal sharp waves and spikes present suggestive of independent regional irritable cortex in the left posterior temporal/parietal region.  No seizures however present.  Day 4: No clinical subclinical seizures.  Right hemispheric slowing with superimposed occasional right anterior frontal sharp waves.  Call interpretation: This day 4 of intensive EEG monitoring with simultaneous video monitoring did not demonstrate any clinical subclinical seizures.  EEG is abnormal due to #1 persistent right hemispheric delta slowing and occasional right anterior frontal sharp waves still suggestive of neuronal dysfunction and cortical irritability in the right anterior frontal cortex.  #2 occasionally left posterior temporal/parietal sharp waves and spikes suggestive of independent cortical irritability in that region.  Clinical correlation is advised

## 2018-02-01 NOTE — Progress Notes (Signed)
261mL of 1mg /mL versed bag wasted in sink with Darlina Guys, RN

## 2018-02-01 NOTE — Progress Notes (Signed)
PULMONARY / CRITICAL CARE MEDICINE   Name: Samantha Atkins MRN: [id number] DOB: 1951-06-27    ADMISSION DATE:  01/28/2018 CONSULTATION DATE: 01/29/2018     REFERRING MD:  Dr. Venora Maples   CHIEF COMPLAINT:  Seizure   HISTORY OF PRESENT ILLNESS:   67 year old female with PMH of Seizure, Osteoporosis, Depression, GERD, Insomnia  Presents to ED on 6/23 s/p seizure. Per Husband patient was found seizing and unresponsive. When EMS arrived patient was foaming at the mouth and was given 5 mg Versed. On arrival to ED patient continued to have seizures requiring 2 mg Ativan. Head CT negative. Intubated for airway protection. Given 1 mg Keppra. Neurology consulted. PCCM asked to admit.   SUBJECTIVE:  Versed gtt off  Remains on continuous EEG Agitated when fentanyl gtt weaned this am  Remains on mod dose levophed     VITAL SIGNS: BP (!) 94/56   Pulse 84   Temp (!) 101.1 F (38.4 C)   Resp 15   Ht 5\' 2"  (1.575 m)   Wt 47.2 kg (104 lb 0.9 oz)   SpO2 100%   BMI 19.03 kg/m   HEMODYNAMICS:    VENTILATOR SETTINGS: Vent Mode: PSV;CPAP FiO2 (%):  [30 %] 30 % Set Rate:  [20 bmp] 20 bmp Vt Set:  [400 mL] 400 mL PEEP:  [5 cmH20] 5 cmH20 Pressure Support:  [10 cmH20] 10 cmH20 Plateau Pressure:  [16 cmH20-18 cmH20] 16 cmH20  INTAKE / OUTPUT: I/O last 3 completed shifts: In: 8239.6 [I.V.:5492.1; NG/GT:1400; IV Piggyback:1347.5] Out: 2983 [Urine:2983]  PHYSICAL EXAMINATION: General:  Thin female, NAD sedated on vent  HEENT: ETT, mm moist  Neuro: sedated on fentanyl gtt, RASS -2, grimaces to pain  CV: s1s2 rrr, no m/r/g PULM: resps even non labored on PS 10/5, coarse  CN:OBSJ, non-tender, bsx4 active  Extremities: warm/dry, no sig edema  Skin: no rashes or lesions    LABS:  BMET Recent Labs  Lab 01/30/18 0500 01/31/18 0459 02/01/18 0423  NA 136 137 135  K 3.3* 3.5 3.7  CL 112* 112* 110  CO2 19* 21* 22  BUN 6* 7* 5*  CREATININE 0.51 0.51 0.42*  GLUCOSE 165* 143* 125*     Electrolytes Recent Labs  Lab 01/30/18 0500  01/31/18 0459 01/31/18 2345 02/01/18 0423  CALCIUM 7.6*  --  7.6*  --  7.1*  MG  --    < > 1.6* 1.7 1.8  PHOS  --    < > 1.5* 2.6 2.7   < > = values in this interval not displayed.    CBC Recent Labs  Lab 01/30/18 0500 01/31/18 0459 02/01/18 0423  WBC 17.2* 9.8 8.6  HGB 11.8* 11.0* 10.9*  HCT 36.0 34.2* 33.2*  PLT 219 206 183    Coag's Recent Labs  Lab 01/28/18 2322  INR 1.06    Sepsis Markers Recent Labs  Lab 01/29/18 0038  01/29/18 0311 01/29/18 0947 01/29/18 1823 01/30/18 0500 01/31/18 0459  LATICACIDVEN 3.7*  --  2.5* 1.7  --   --   --   PROCALCITON  --    < >  --  2.16 1.71 1.26 1.10   < > = values in this interval not displayed.    ABG Recent Labs  Lab 01/29/18 0004 01/29/18 0358  PHART 7.310* 7.354  PCO2ART 49.5* 32.6  PO2ART 514.0* 155.0*    Liver Enzymes Recent Labs  Lab 01/28/18 2322  AST 46*  ALT 43  ALKPHOS 93  BILITOT  0.3  ALBUMIN 3.8    Cardiac Enzymes No results for input(s): TROPONINI, PROBNP in the last 168 hours.  Glucose Recent Labs  Lab 01/31/18 1544 01/31/18 1954 01/31/18 2357 02/01/18 0030 02/01/18 0401 02/01/18 0831  GLUCAP 159* 137* 58* 79 110* 104*    Imaging Dg Chest Portable 1 View  Result Date: 02/01/2018 CLINICAL DATA:  Respiratory failure EXAM: PORTABLE CHEST 1 VIEW COMPARISON:  01/31/2018 FINDINGS: Cardiac shadows within normal limits. Endotracheal tube and nasogastric catheter are stable in appearance. Bilateral pleural effusions are noted right greater than left. Mild underlying atelectatic changes are noted as well. No new focal abnormality is seen. IMPRESSION: Overall stable appearance of the chest when compared with the previous day. Electronically Signed   By: Inez Catalina M.D.   On: 02/01/2018 08:30     STUDIES:  CXR 6/24 > Normal sized heart. Tortuous and calcified thoracic aorta. Endotracheal tube tip 3.6 cm above the carina. Nasogastric  tube extending into the stomach. Bilateral nipple shadows. Clear lungs. Proximal left humerus fixation hardware. CT Head 6/24 > No acute abnormality. 2. Stable diffuse cerebral and cerebellar atrophy. 3. Stable chronic small vessel white matter ischemic changes in both cerebral hemispheres. LT EEG 6/25>>> This is day 2 of intensive EEG monitoring with simultaneous video monitoring is abnormal for several reasons #1 electrographic seizures arising from right anterior frontal and paracentral cortex with subsequent resolution.  Last seizure around 12 noon.  #2 continuous right hemispheric background activity slowing with superimposed right anterior frontal and anterior paracentral spike still present suggestive of neuronal dysfunction and significant cortical irritability in right anterior and paracentral cortex.    CULTURES: Blood 6/24 >> Urine 6/24 >>neg  LP 6/25>>> Sputum 6/25>>>normal flora   ANTIBIOTICS: unasyn 6/25>>>  SIGNIFICANT EVENTS: 6/23 > Presents to ED  6/25 status epilepticus requiring burst suppression   LINES/TUBES: ETT 6/24 >> R fem CVL 6/24>>>  DISCUSSION: 67yo female with hx seizure disorder admitted with AMS requiring intubation and seizures which were initially controlled. Developed ongoing status 6/25, remains heavily sedated for burst suppression.  Course c/b ?aspiration    ASSESSMENT / PLAN:  Respiratory Insufficieny s/p Seizure  ?aspiration even 6/25 P:   Vent support - 8cc/kg  F/u CXR  F/u ABG PS wean as tol  No extubation until mental status improved, seizures controlled  unasyn for ?aspiration    Hypotension in setting of ?sepsis vs more likely medication effect - SBP 90's at baseline  P:  Continue levophed and titrate as able for goal MAP >60 Trend pct, cultures  unasyn as above Continue levophed as needed to maintain MAP >60   Anion Gap Metabolic Acidosis with Lactic Acidosis  Hypokalemia  HypoMg  hypoPhos P:   F/u chem     Malnutrition  SUP P:   Continue TF  PPI    Normocytic Anemia  P:  F/u CBC  SCDs, SQ heparin   Leukocytosis  suspected secondary to seizure ??aspiration PNA  Fever  P:   Follow cultures  Trend pct, wbc  Continue unasyn as above    Hyperglycemia    P:   SSI     Encephalopathy in setting of Status Epilepticus with breakthrough status  H/O Multiple Falls, Osteo, Seizure on Previous admission 02/2017, Depression  Status epilepticus  P:   Neuro following  Adjusting phenytoin Fentanyl gtt - wean as able for goal RASS -1 Continuous EEG per neuro  Continue keppra, fosphenytoin, dilantin, phenobarb per neuro   FAMILY  - Updates: husband  updated at bedside 6/27 - Inter-disciplinary family meet or Palliative Care meeting due by: 02/05/2018    Nickolas Madrid, NP 02/01/2018  10:04 AM Pager: [phone] or [phone]

## 2018-02-01 NOTE — Progress Notes (Signed)
Subjective: Much improved clinically  Exam: Vitals:   02/01/18 0745 02/01/18 0800  BP: 112/68 107/60  Pulse: 87 84  Resp: (!) 21 19  Temp: (!) 100.6 F (38.1 C) (!) 100.8 F (38.2 C)  SpO2: 100% 100%   Gen: In bed, intubated Resp: ventilated Abd: soft, nt  Neuro: MS: Awake, alert, follows commands.  CN: Does not move eyes, PERRL, VFF Motor: follows commands x 4, appears to have some asterixis.  Sensory: as above  Pertinent Labs: PHB 18 PHT 9.1  Impression: 67 yo F with breakthrough status epilepticus with unclear cause of breakthrough. She initially was controlled with ativan and keppra, but subsequently re-developed status epilepticus. She had cessation of seizure with ativan, but continued with PLEDs until addition of phenobarbital. Now off of sedation since this morning, no recurrance of PLEDs.   Recommendations: 1) Continue LTM-EEG  2) continue keppra at 1500mg  BID 3) Continue lyrica 200mg  TID 4) additional dose of phenytoin again today 5) increase dose of PHT to 150mg  TID.  6) Continue PHB 1mg /kg BID 7) will follow   This patient is critically ill and at significant risk of neurological worsening, death and care requires constant monitoring of vital signs, hemodynamics,respiratory and cardiac monitoring, neurological assessment, discussion with family, other specialists and medical decision making of high complexity. I spent 45 minutes of neurocritical care time  in the care of  this patient.  Roland Rack, MD [facility] [phone]  If 7pm- 7am, please page neurology on call as listed in [place]. 02/01/2018  8:28 AM

## 2018-02-02 LAB — GLUCOSE, CAPILLARY
GLUCOSE-CAPILLARY: 115 mg/dL — AB (ref 70–99)
GLUCOSE-CAPILLARY: 117 mg/dL — AB (ref 70–99)
GLUCOSE-CAPILLARY: 154 mg/dL — AB (ref 70–99)
Glucose-Capillary: 109 mg/dL — ABNORMAL HIGH (ref 70–99)
Glucose-Capillary: 110 mg/dL — ABNORMAL HIGH (ref 70–99)
Glucose-Capillary: 177 mg/dL — ABNORMAL HIGH (ref 70–99)
Glucose-Capillary: 108 mg/dL — ABNORMAL HIGH (ref 70–99)

## 2018-02-02 LAB — PHENYTOIN LEVEL, TOTAL: PHENYTOIN LVL: 9.9 ug/mL — AB (ref 10.0–20.0)

## 2018-02-02 LAB — PHENOBARBITAL LEVEL: Phenobarbital: 15.3 ug/mL (ref 15.0–30.0)

## 2018-02-02 MED ORDER — DEXMEDETOMIDINE HCL IN NACL 400 MCG/100ML IV SOLN
0.4000 ug/kg/h | INTRAVENOUS | Status: DC
  Administered 2018-02-02: 0.4 ug/kg/h via INTRAVENOUS
  Administered 2018-02-03: 1 ug/kg/h via INTRAVENOUS
  Administered 2018-02-03: 0.4 ug/kg/h via INTRAVENOUS
  Administered 2018-02-04: 0.6 ug/kg/h via INTRAVENOUS
  Administered 2018-02-05: 0.4 ug/kg/h via INTRAVENOUS
  Administered 2018-02-06: 0.2 ug/kg/h via INTRAVENOUS
  Administered 2018-02-06: 0.4 ug/kg/h via INTRAVENOUS
  Filled 2018-02-02 (×7): qty 100

## 2018-02-02 MED ORDER — PHENYTOIN SODIUM 50 MG/ML IJ SOLN
200.0000 mg | Freq: Three times a day (TID) | INTRAMUSCULAR | Status: DC
  Administered 2018-02-02 – 2018-02-06 (×12): 200 mg via INTRAVENOUS
  Filled 2018-02-02 (×19): qty 4

## 2018-02-02 MED ORDER — SODIUM CHLORIDE 0.9 % IV SOLN: 175.0000 mg | Freq: Three times a day (TID) | INTRAVENOUS | Status: DC

## 2018-02-02 MED ORDER — PHENYTOIN SODIUM 50 MG/ML IJ SOLN
50.0000 mg | Freq: Once | INTRAMUSCULAR | Status: AC
  Administered 2018-02-02: 50 mg via INTRAVENOUS
  Filled 2018-02-02: qty 2

## 2018-02-02 NOTE — Progress Notes (Signed)
Subjective: Continues to be improved   Exam: Vitals:   02/02/18 1115 02/02/18 1130  BP: 111/67 119/61  Pulse: 91 95  Resp: (!) 31 (!) 23  Temp: 98.1 F (36.7 C) 98.1 F (36.7 C)  SpO2: 99% 99%   Gen: In bed, intubated Resp: ventilated Abd: soft, nt  Neuro: MS: Awake, alert, follows commands.  CN: Does not move eyes, PERRL, VFF Motor: follows commands x 4, appears to have some asterixis.  Sensory: as above  Pertinent Labs: PHB 15.3 PHT 9.9  Impression: 67 yo F with breakthrough status epilepticus with unclear cause of breakthrough. She initially was controlled with ativan and keppra, but subsequently re-developed status epilepticus. She had cessation of seizure with ativan, but continued with PLEDs until addition of phenobarbital. Now off of sedation since yesterday.   Recommendations: 1) can stop LTM EEG 2) continue keppra at 1500mg  BID 3) Continue lyrica 200mg  TID 4) increase phenytoin to 200 mg 3 times daily 5) Continue PHB 1mg /kg BID 7) neurology will continue to follow   This patient is critically ill and at significant risk of neurological worsening, death and care requires constant monitoring of vital signs, hemodynamics,respiratory and cardiac monitoring, neurological assessment, discussion with family, other specialists and medical decision making of high complexity. I spent 45 minutes of neurocritical care time  in the care of  this patient.  Roland Rack, MD [facility] [phone]  If 7pm- 7am, please page neurology on call as listed in [place]. 02/02/2018  11:56 AM

## 2018-02-02 NOTE — Progress Notes (Signed)
PULMONARY / CRITICAL CARE MEDICINE   Name: Samantha Atkins MRN: [id number] DOB: 25-Aug-1950    ADMISSION DATE:  01/28/2018 CONSULTATION DATE: 01/29/2018     REFERRING MD:  Dr. Venora Maples   CHIEF COMPLAINT:  Seizure   HISTORY OF PRESENT ILLNESS:   67 year old female with PMH of Seizure, Osteoporosis, Depression, GERD, Insomnia  Presents to ED on 6/23 s/p seizure. Per Husband patient was found seizing and unresponsive. When EMS arrived patient was foaming at the mouth and was given 5 mg Versed. On arrival to ED patient continued to have seizures requiring 2 mg Ativan. Head CT negative. Intubated for airway protection. Given 1 mg Keppra. Neurology consulted. PCCM asked to admit.   SUBJECTIVE:  Despite being sedated she follows commands moves all extremities.  Notable for having bilateral JVD.  We will get her off sedation she may be weanable and be able to extubate her.    VITAL SIGNS: BP 119/68   Pulse 96   Temp 99.7 F (37.6 C)   Resp (!) 23   Ht 5\' 2"  (1.575 m)   Wt 112 lb 3.4 oz (50.9 kg)   SpO2 100%   BMI 20.52 kg/m   HEMODYNAMICS:    VENTILATOR SETTINGS: Vent Mode: PSV;CPAP FiO2 (%):  [30 %] 30 % Set Rate:  [20 bmp] 20 bmp Vt Set:  [400 mL] 400 mL PEEP:  [5 cmH20] 5 cmH20 Pressure Support:  [10 cmH20] 10 cmH20 Plateau Pressure:  [17 cmH20-23 cmH20] 17 cmH20  INTAKE / OUTPUT: I/O last 3 completed shifts: In: 11540.8 [I.V.:5056; NG/GT:2150; IV Piggyback:4334.9] Out: 6625 [Urine:6625]  PHYSICAL EXAMINATION: General: Frail female sedated on mechanical ventilatory support HEENT: Bilateral JVD is noted, endotracheal tube in place Neuro: Not sedated follows commands moves arms wiggles toes opens eyes CV: Heart sounds are regular PULM: even/non-labored, lungs bilaterally rhonchi YQ:MVHQ, non-tender, bsx4 active  Extremities: warm/dry,- edema  Skin: no rashes or lesions     LABS:  BMET Recent Labs  Lab 01/30/18 0500 01/31/18 0459 02/01/18 0423  NA 136 137 135  K  3.3* 3.5 3.7  CL 112* 112* 110  CO2 19* 21* 22  BUN 6* 7* 5*  CREATININE 0.51 0.51 0.42*  GLUCOSE 165* 143* 125*    Electrolytes Recent Labs  Lab 01/30/18 0500  01/31/18 0459 01/31/18 2345 02/01/18 0423  CALCIUM 7.6*  --  7.6*  --  7.1*  MG  --    < > 1.6* 1.7 1.8  PHOS  --    < > 1.5* 2.6 2.7   < > = values in this interval not displayed.    CBC Recent Labs  Lab 01/30/18 0500 01/31/18 0459 02/01/18 0423  WBC 17.2* 9.8 8.6  HGB 11.8* 11.0* 10.9*  HCT 36.0 34.2* 33.2*  PLT 219 206 183    Coag's Recent Labs  Lab 01/28/18 2322  INR 1.06    Sepsis Markers Recent Labs  Lab 01/29/18 0038  01/29/18 0311 01/29/18 0947 01/29/18 1823 01/30/18 0500 01/31/18 0459  LATICACIDVEN 3.7*  --  2.5* 1.7  --   --   --   PROCALCITON  --    < >  --  2.16 1.71 1.26 1.10   < > = values in this interval not displayed.    ABG Recent Labs  Lab 01/29/18 0004 01/29/18 0358  PHART 7.310* 7.354  PCO2ART 49.5* 32.6  PO2ART 514.0* 155.0*    Liver Enzymes Recent Labs  Lab 01/28/18 2322  AST 46*  ALT  43  ALKPHOS 93  BILITOT 0.3  ALBUMIN 3.8    Cardiac Enzymes No results for input(s): TROPONINI, PROBNP in the last 168 hours.  Glucose Recent Labs  Lab 02/01/18 1207 02/01/18 1546 02/01/18 2005 02/02/18 0014 02/02/18 0420 02/02/18 0738  GLUCAP 130* 113* 109* 115* 117* 109*    Imaging No results found.   STUDIES:  CXR 6/24 > Normal sized heart. Tortuous and calcified thoracic aorta. Endotracheal tube tip 3.6 cm above the carina. Nasogastric tube extending into the stomach. Bilateral nipple shadows. Clear lungs. Proximal left humerus fixation hardware. CT Head 6/24 > No acute abnormality. 2. Stable diffuse cerebral and cerebellar atrophy. 3. Stable chronic small vessel white matter ischemic changes in both cerebral hemispheres. LT EEG 6/25>>> This is day 2 of intensive EEG monitoring with simultaneous video monitoring is abnormal for several reasons #1  electrographic seizures arising from right anterior frontal and paracentral cortex with subsequent resolution.  Last seizure around 12 noon.  #2 continuous right hemispheric background activity slowing with superimposed right anterior frontal and anterior paracentral spike still present suggestive of neuronal dysfunction and significant cortical irritability in right anterior and paracentral cortex.    CULTURES: Blood 6/24 >> Urine 6/24 >>neg  LP 6/25>>> Sputum 6/25>>>normal flora   ANTIBIOTICS: unasyn 6/25>>>  SIGNIFICANT EVENTS: 6/23 > Presents to ED  6/25 status epilepticus requiring burst suppression   LINES/TUBES: ETT 6/24 >> R fem CVL 6/24>>>  DISCUSSION: 67yo female with hx seizure disorder admitted with AMS requiring intubation and seizures which were initially controlled. Developed ongoing status 6/25, remains heavily sedated for burst suppression.  Course c/b ?aspiration.  02/02/2018 when sedation is decreased she is awake follows commands but does become agitated   ASSESSMENT / PLAN:  Respiratory Insufficieny s/p Seizure  ?aspiration even 6/25 P:   Wean as tolerated Serial chest x-ray Continue Unasyn for 24-hour  Hypotension in setting of ?sepsis vs more likely medication effect - SBP 90's at baseline  P:  Wean Levophed as tolerated.  I suspect when we quit sedating her her blood pressure will improve    Anion Gap Metabolic Acidosis with Lactic Acidosis resolved as of 02/02/2018 Hypokalemia resolved HypoMg resolved hypoPhos resolved Recent Labs  Lab 01/30/18 0500 01/31/18 0459 02/01/18 0423  K 3.3* 3.5 3.7   Lab Results  Component Value Date   CREATININE 0.42 (L) 02/01/2018   CREATININE 0.51 01/31/2018   CREATININE 0.51 01/30/2018    P:   F/u chem    Malnutrition  SUP P:   Tube feedings PPI  Normocytic Anemia  Recent Labs    01/31/18 0459 02/01/18 0423  HGB 11.0* 10.9*    P:  Trend CBC Pneumatic Air stockings  Leukocytosis   suspected secondary to seizure ??aspiration PNA  Fever  P:   Sputum culture with normal flora from 01/30/2018 Day #4 Unasyn  fever 100. Consider DC Unasyn day 5  Hyperglycemia    CBG (last 3)  Recent Labs    02/02/18 0014 02/02/18 0420 02/02/18 0738  GLUCAP 115* 117* 109*    P:   SSI     Encephalopathy in setting of Status Epilepticus with breakthrough status  H/O Multiple Falls, Osteo, Seizure on Previous admission 02/2017, Depression  Status epilepticus  P:   Neurology is involved Day #4 continuous EEG without seizure activity We will DC fentanyl drip and utilize Precedex Continue Keppra fosphenytoin Dilantin and phenobarbital per neurology   FAMILY  - Updates: No family at bedside on 02/02/2018 - Inter-disciplinary family  meet or Palliative Care meeting due by: 02/05/2018    App cct 30 min   Richardson Landry Bailea Beed ACNP Maryanna Shape PCCM Pager [phone] till 1 pm If no answer page [phone] 02/02/2018, 8:46 AM

## 2018-02-02 NOTE — Progress Notes (Signed)
vLTM EEG Maint complete. Moved forehead leads to prevent skin breakdown.

## 2018-02-02 NOTE — Progress Notes (Signed)
RT note- Placed back to full support increase RR in the 40's.

## 2018-02-02 NOTE — Procedures (Signed)
LTM-EEG Report reading: Samantha Schimke, MD  HISTORY: Continuous video-EEG monitoring performed for 67 year old with breakthrough seizures.  ACQUISITION: International 10-20 system for electrode placement; 18 channels with additional eyes linked to ipsilateral ears and EKG. Additional T1-T2 electrodes were used. Continuous video recording obtained.   MEDICATIONS: as per EMR  Day #5 Recording begins 02/01/2018 at 7 55  AM Recording ends 6/282019 at 7:56AM CPT 95951  DAY 2: Background activities marked by disorganized continues background activity slowing in theta and delta range with stage changes.  More prominent slowing present across right hemisphere distributed broadly in the delta range ranging between 1.5 to 3.5 cps.  Superimposed right anterior frontal and right paracentral/central parietal spikes was brought negative field present throughout the recording.  No electrographic seizures present with electrographic onset across the right anterior frontal and frontoparietal paracentral region and electrographically marked by buildup of evolving spike and wave discharges across initially right anterior frontal and frontal central/central parietal region gradually involving adjacent right hemispheric cortex.  Given the proximity to midline negative feel tend to extend to the left hemisphere however ictal pattern lateralized to the right hemisphere throughout the electrographic seizure.  Last electrographic seizure noted around 12 noon.  There is no obvious clinical accompaniment noted however camera is suboptimally positioned so patient face is not visible.  However it appears that had his turn to the right.  After cessation of ictal activities the background activity is marked by  continuous delta slowing with more prominent delta slowing across right hemisphere.  Superimposed ,  frequent spikes across right hemisphere again with maximum negativity in anterior and paracentral right hemispheric cortex  region present throughout the recording.  At times occurring periodic fashion suggestive of periodic lateralized epileptiform discharges on the right.  However no electrographic seizures present.  Day 3: Frequent right hemispheric spikes as well as periodic lateralized epileptiform discharges during first couple of hours of the recording.  At times right periodic lateralized epileptiform discharges occur in near continues mildly evolving fashion.  Brief subclinical seizures cannot be completely ruled out.  With medication adjustment however there is a complete resolution of right periodic lateralized epileptiform discharges.  Throughout the remaining of the recording rare right anterior frontal sharp waves and spikes still present suggestive of cortical irritability in that region.  In addition there is a continuous right hemispheric slowing present suggestive of neuronal dysfunction across right hemisphere.  Occasionally there is a left posterior temporal/parietal sharp waves and spikes present suggestive of independent regional irritable cortex in the left posterior temporal/parietal region.  No seizures however present.  Day 4: No clinical subclinical seizures.  Right hemispheric slowing with superimposed occasional right anterior frontal sharp waves.  Day 5: No clinical or subclinical seizures present during this recording.  Several pushbutton activation events were not seizures. Background activities marked by a right hemispheric slowing with superimposed right anterior frontal sharp waves.  Occasionally there is a independent left posterior temporal sharp waves present as well.  Call interpretation: This day 5 of intensive EEG monitoring with simultaneous video monitoring did not demonstrate any clinical subclinical seizures.  EEG is abnormal due to #1 persistent right hemispheric delta slowing and occasional right anterior frontal sharp waves still suggestive of neuronal dysfunction and cortical  irritability in the right anterior frontal cortex.  #2 occasionally left posterior temporal/parietal sharp waves and spikes suggestive of independent cortical irritability in that region.  Clinical correlation is advised

## 2018-02-02 NOTE — Progress Notes (Signed)
vLTM EEG complete. No skin breakdown 

## 2018-02-02 NOTE — Progress Notes (Signed)
I agree with note reagarding proteein-calorie malnuturition

## 2018-02-03 ENCOUNTER — Inpatient Hospital Stay (HOSPITAL_COMMUNITY): Payer: Commercial Managed Care - PPO

## 2018-02-03 LAB — GLUCOSE, CAPILLARY
Glucose-Capillary: 104 mg/dL — ABNORMAL HIGH (ref 70–99)
Glucose-Capillary: 124 mg/dL — ABNORMAL HIGH (ref 70–99)
Glucose-Capillary: 147 mg/dL — ABNORMAL HIGH (ref 70–99)
Glucose-Capillary: 149 mg/dL — ABNORMAL HIGH (ref 70–99)
Glucose-Capillary: 84 mg/dL (ref 70–99)
Glucose-Capillary: 85 mg/dL (ref 70–99)

## 2018-02-03 LAB — CSF CULTURE W GRAM STAIN
Culture: NO GROWTH
Special Requests: NORMAL

## 2018-02-03 LAB — CULTURE, BLOOD (ROUTINE X 2)
CULTURE: NO GROWTH
CULTURE: NO GROWTH
Special Requests: ADEQUATE

## 2018-02-03 LAB — PHENYTOIN LEVEL, TOTAL: PHENYTOIN LVL: 13.5 ug/mL (ref 10.0–20.0)

## 2018-02-03 LAB — BASIC METABOLIC PANEL
Anion gap: 6 (ref 5–15)
CO2: 25 mmol/L (ref 22–32)
Calcium: 7.7 mg/dL — ABNORMAL LOW (ref 8.9–10.3)
Chloride: 108 mmol/L (ref 98–111)
Creatinine, Ser: 0.39 mg/dL — ABNORMAL LOW (ref 0.44–1.00)
GFR calc non Af Amer: >60 mL/min (ref >60)
GLUCOSE: 144 mg/dL — AB (ref 70–99)
Potassium: 3.3 mmol/L — ABNORMAL LOW (ref 3.5–5.1)
SODIUM: 139 mmol/L (ref 135–145)

## 2018-02-03 LAB — POCT I-STAT 3, ART BLOOD GAS (G3+)
Acid-Base Excess: 3 mmol/L — ABNORMAL HIGH (ref 0.0–2.0)
Bicarbonate: 24.8 mmol/L (ref 20.0–28.0)
O2 Saturation: 97 %
PCO2 ART: 27.8 mmHg — AB (ref 32.0–48.0)
PH ART: 7.557 — AB (ref 7.350–7.450)
PO2 ART: 74 mmHg — AB (ref 83.0–108.0)
Patient temperature: 98
TCO2: 26 mmol/L (ref 22–32)

## 2018-02-03 LAB — MAGNESIUM: Magnesium: 1.7 mg/dL (ref 1.7–2.4)

## 2018-02-03 LAB — BRAIN NATRIURETIC PEPTIDE: B NATRIURETIC PEPTIDE 5: 1316.1 pg/mL — AB (ref 0.0–100.0)

## 2018-02-03 LAB — PHENOBARBITAL LEVEL: PHENOBARBITAL: 15.2 ug/mL (ref 15.0–30.0)

## 2018-02-03 LAB — PHOSPHORUS: Phosphorus: 2.8 mg/dL (ref 2.5–4.6)

## 2018-02-03 MED ORDER — SODIUM CHLORIDE 0.9 % IV SOLN
0.5000 mg/h | INTRAVENOUS | Status: DC
  Administered 2018-02-03: 0.5 mg/h via INTRAVENOUS
  Filled 2018-02-03: qty 10

## 2018-02-03 MED ORDER — MIDAZOLAM HCL 2 MG/2ML IJ SOLN
3.0000 mg | INTRAMUSCULAR | Status: DC | PRN
  Administered 2018-02-03: 3 mg via INTRAVENOUS

## 2018-02-03 MED ORDER — POTASSIUM CHLORIDE CRYS ER 20 MEQ PO TBCR: 20.0000 meq | EXTENDED_RELEASE_TABLET | Freq: Once | ORAL | Status: DC

## 2018-02-03 MED ORDER — POTASSIUM CHLORIDE 10 MEQ/100ML IV SOLN
10.0000 meq | INTRAVENOUS | Status: AC
  Administered 2018-02-03 (×2): 10 meq via INTRAVENOUS
  Filled 2018-02-03 (×2): qty 100

## 2018-02-03 MED ORDER — FUROSEMIDE 10 MG/ML IJ SOLN
20.0000 mg | Freq: Every day | INTRAMUSCULAR | Status: DC
  Administered 2018-02-03 – 2018-02-06 (×4): 20 mg via INTRAVENOUS
  Filled 2018-02-03 (×4): qty 2

## 2018-02-03 MED ORDER — MIDAZOLAM HCL 2 MG/2ML IJ SOLN
INTRAMUSCULAR | Status: AC
  Filled 2018-02-03: qty 4

## 2018-02-03 MED ORDER — LIDOCAINE HCL (PF) 1 % IJ SOLN
INTRAMUSCULAR | Status: AC
  Filled 2018-02-03: qty 30

## 2018-02-03 MED ORDER — POTASSIUM CHLORIDE 20 MEQ PO PACK
20.0000 meq | PACK | Freq: Once | ORAL | Status: AC
  Administered 2018-02-03: 20 meq via ORAL
  Filled 2018-02-03: qty 1

## 2018-02-03 NOTE — Progress Notes (Signed)
Pt tachypneic despite increases in precedex, Dr. Debbora Dus notified. Give 3mg  of versed q3h prn per order.

## 2018-02-03 NOTE — Progress Notes (Signed)
PULMONARY / CRITICAL CARE MEDICINE   Name: Samantha Atkins MRN: [id number] DOB: 18-Aug-1950    ADMISSION DATE:  01/28/2018 CONSULTATION DATE: 01/29/2018     REFERRING MD:  Dr. Venora Maples   CHIEF COMPLAINT:  Seizure   HISTORY OF PRESENT ILLNESS:   67 year old female with PMH of Seizure, Osteoporosis, Depression, GERD, Insomnia  Presents to ED on 6/23 s/p seizure. Per Husband patient was found seizing and unresponsive. When EMS arrived patient was foaming at the mouth and was given 5 mg Versed. On arrival to ED patient continued to have seizures requiring 2 mg Ativan. Head CT negative. Intubated for airway protection. Given 1 mg Keppra. Neurology consulted. PCCM asked to admit.    6/29 The patient remains on the ventilator. Was tried on SBT but became tachypneic this AM. The patient is no longer seizing. Has just come off pressors.   VITAL SIGNS: BP (!) 143/96   Pulse 100   Temp 98 F (36.7 C) (Axillary)   Resp (!) 28   Ht 5\' 2"  (1.575 m)   Wt 118 lb 6.2 oz (53.7 kg)   SpO2 100%   BMI 21.65 kg/m      VENTILATOR SETTINGS: Vent Mode: PRVC FiO2 (%):  [30 %] 30 % Set Rate:  [20 bmp] 20 bmp Vt Set:  [400 mL] 400 mL PEEP:  [5 cmH20] 5 cmH20 Plateau Pressure:  [18 cmH20-21 cmH20] 19 cmH20  INTAKE / OUTPUT: I/O last 3 completed shifts: In: 10392.5 [I.V.:4476.7; NG/GT:1300; IV Piggyback:4615.9] Out: 6025 [Urine:6025]  PHYSICAL EXAMINATION: General: Frail female sedated on mechanical ventilatory support HEENT: Bilateral JVD is noted, endotracheal tube in place Neuro: Not sedated follows commands moves arms wiggles toes opens eyes CV: Heart sounds are regular PULM: even/non-labored, lungs bilaterally rhonchi OQ:HUTM, non-tender, bsx4 active  Extremities: warm/dry,- edema  Skin: no rashes or lesions   LABS:  BMET Recent Labs  Lab 01/31/18 0459 02/01/18 0423 02/03/18 0512  NA 137 135 139  K 3.5 3.7 3.3*  CL 112* 110 108  CO2 21* 22 25  BUN 7* 5* <5*  CREATININE 0.51  0.42* 0.39*  GLUCOSE 143* 125* 144*    Electrolytes Recent Labs  Lab 01/31/18 0459 01/31/18 2345 02/01/18 0423 02/03/18 0512  CALCIUM 7.6*  --  7.1* 7.7*  MG 1.6* 1.7 1.8 1.7  PHOS 1.5* 2.6 2.7 2.8    CBC Recent Labs  Lab 01/30/18 0500 01/31/18 0459 02/01/18 0423  WBC 17.2* 9.8 8.6  HGB 11.8* 11.0* 10.9*  HCT 36.0 34.2* 33.2*  PLT 219 206 183    Coag's Recent Labs  Lab 01/28/18 2322  INR 1.06    Sepsis Markers Recent Labs  Lab 01/29/18 0038  01/29/18 0311 01/29/18 0947 01/29/18 1823 01/30/18 0500 01/31/18 0459  LATICACIDVEN 3.7*  --  2.5* 1.7  --   --   --   PROCALCITON  --    < >  --  2.16 1.71 1.26 1.10   < > = values in this interval not displayed.    ABG Recent Labs  Lab 01/29/18 0004 01/29/18 0358  PHART 7.310* 7.354  PCO2ART 49.5* 32.6  PO2ART 514.0* 155.0*    Liver Enzymes Recent Labs  Lab 01/28/18 2322  AST 46*  ALT 43  ALKPHOS 93  BILITOT 0.3  ALBUMIN 3.8    Cardiac Enzymes No results for input(s): TROPONINI, PROBNP in the last 168 hours.  Glucose Recent Labs  Lab 02/02/18 1528 02/02/18 2035 02/02/18 2350 02/03/18 0436 02/03/18 5465  02/03/18 1126  GLUCAP 177* 108* 154* 124* 149* 84    Imaging Dg Chest Port 1 View  Result Date: 02/03/2018 CLINICAL DATA:  ETT placement evaluation. EXAM: PORTABLE CHEST 1 VIEW COMPARISON:  February 03, 2018 FINDINGS: The ETT and NG tubes are in good position. The cardiomediastinal silhouette is stable. Known left rib fractures. No pneumothorax. Layering effusions and underlying opacities bilaterally are similar since February 01, 2018 but more prominent when compared to February 03, 2018. The difference may be positional. IMPRESSION: 1. Support apparatus as above.  ET tube in good position. 2. Left rib fractures without pneumothorax. 3. Layering bilateral pleural effusions are more prominent since February 03, 2018 but similar since February 01, 2018, possibly due to difference in positioning. Electronically  Signed   By: Dorise Bullion III M.D   On: 02/03/2018 11:47   Dg Chest Port 1 View  Result Date: 02/03/2018 CLINICAL DATA:  Patient with respiratory failure EXAM: PORTABLE CHEST 1 VIEW COMPARISON:  Chest radiograph 02/01/2018 FINDINGS: ET tube terminates within the proximal right mainstem bronchus. Stable cardiac and mediastinal contours. Enteric tube tip and side-port project over the stomach. Monitoring leads overlie the patient. Layering moderate right and small left pleural effusions with underlying opacities. Low lung volumes. No pneumothorax. IMPRESSION: ET tube terminates in the proximal right mainstem bronchus. Recommend retraction approximately 2 cm. Right-greater-than-left layering effusions with underlying opacities. These results were called by telephone at the time of interpretation on 02/03/2018 at 8:46 am to Nurse [facility] , who verbally acknowledged these results. Electronically Signed   By: Lovey Newcomer M.D.   On: 02/03/2018 08:48     STUDIES:  CXR 6/24 > Normal sized heart. Tortuous and calcified thoracic aorta. Endotracheal tube tip 3.6 cm above the carina. Nasogastric tube extending into the stomach. Bilateral nipple shadows. Clear lungs. Proximal left humerus fixation hardware. CT Head 6/24 > No acute abnormality. 2. Stable diffuse cerebral and cerebellar atrophy. 3. Stable chronic small vessel white matter ischemic changes in both cerebral hemispheres. LT EEG 6/25>>> This is day 2 of intensive EEG monitoring with simultaneous video monitoring is abnormal for several reasons #1 electrographic seizures arising from right anterior frontal and paracentral cortex with subsequent resolution.  Last seizure around 12 noon.  #2 continuous right hemispheric background activity slowing with superimposed right anterior frontal and anterior paracentral spike still present suggestive of neuronal dysfunction and significant cortical irritability in right anterior and paracentral cortex.     CULTURES: Blood 6/24 >> Urine 6/24 >>neg  LP 6/25>>> Sputum 6/25>>>normal flora   ANTIBIOTICS: unasyn 6/25>>>  SIGNIFICANT EVENTS: 6/23 > Presents to ED  6/25 status epilepticus requiring burst suppression   LINES/TUBES: ETT 6/24 >> R fem CVL 6/24>>>  DISCUSSION: 67yo female with hx seizure disorder admitted with AMS requiring intubation and seizures which were initially controlled. Developed ongoing status 6/25, remains heavily sedated for burst suppression.  Course c/b ?aspiration.  02/02/2018 when sedation is decreased she is awake follows commands but does become agitated   ASSESSMENT / PLAN:  Respiratory Insufficieny s/p Seizure  ?aspiration even 6/25 P:   Wean as tolerated Serial chest x-ray Continue Unasyn for 24-hour. Patient has bilateral effusions bilaterally R>>L. May benfit form thoracentesis. Will try to arrange with IR service  Hypotension in setting of ?sepsis vs more likely medication effect - SBP 90's at baseline  P:  Wean Levophed as tolerated.  I suspect when we quit sedating her her blood pressure will improve Hypotension has appeared to resolve at  this time.  ? CHF The patient is severl liters ahead in the past few days ( > 5 liters. Her CXR shows bilateral effusuion R>L. I am going to put her on some lasix. May need the right chest tapped as well. I have stopped NaCL as well that was running at 100cc/hr.  Malnutrition  SUP P:   Tube feedings PPI  Normocytic Anemia  Recent Labs    02/01/18 0423  HGB 10.9*    P:  Trend CBC Pneumatic Air stockings  Leukocytosis  suspected secondary to seizure ??aspiration PNA  Fever  P:   Sputum culture with normal flora from 01/30/2018 Day #4 Unasyn  fever 100. Consider DC Unasyn day 5  Hyperglycemia    CBG (last 3)  Recent Labs    02/03/18 0436 02/03/18 0728 02/03/18 1126  GLUCAP 124* 149* 84    P:   SSI     Encephalopathy in setting of Status Epilepticus with breakthrough status   H/O Multiple Falls, Osteo, Seizure on Previous admission 02/2017, Depression  Status epilepticus  P:   Neurology is involved Day #4 continuous EEG without seizure activity We will DC fentanyl drip and utilize Precedex Continue Keppra, fosphenytoin, Dilantin and phenobarbital per neurology   FAMILY  - Updates: No family at bedside on 02/02/2018 - Inter-disciplinary family meet or Palliative Care meeting due by: 02/05/2018    App cct 30 min   Richardson Landry Minor ACNP Maryanna Shape PCCM Pager [phone] till 1 pm If no answer page [phone] 02/03/2018, 1:44 PM

## 2018-02-03 NOTE — Progress Notes (Signed)
Subjective: Weaned for 8 hours yesterday   Exam: Vitals:   02/03/18 0600 02/03/18 0732  BP: (!) 94/59   Pulse: 72 73  Resp: 20   Temp:    SpO2: 100% 100%   Gen: In bed, intubated Resp: ventilated Abd: soft, nt  Neuro: MS: Awake, alert, follows commands.  CN: Does not move eyes, PERRL, VFF Motor: follows commands x 4, appears to have some asterixis.  Sensory: as above  Pertinent Labs: PHB pending PHT pending  Impression: 67 yo F with breakthrough status epilepticus with unclear cause of breakthrough. She initially was controlled with ativan and keppra, but subsequently re-developed status epilepticus. She had cessation of seizure with ativan, but continued with PLEDs until addition of phenobarbital.   She is now much improved. Still weaning on the vent.    Recommendations: 1) can stop LTM EEG 2) continue keppra at 1500mg  BID 3) Continue lyrica 200mg  TID 4) continue phenytoin 200 mg 3 times daily 5) Continue PHB 1mg /kg BID 7) neurology will continue to follow   Roland Rack, MD [facility] [phone]  If 7pm- 7am, please page neurology on call as listed in [place]. 02/03/2018  7:46 AM

## 2018-02-03 NOTE — Progress Notes (Addendum)
Pharmacy Antibiotic Note  Samantha Atkins is a 67 y.o. female admitted on 01/28/2018 with aspiration PNA.  Pharmacy has been consulted for Unasyn dosing.  Today is day #5 of therapy.  Renal function stable, afebrile, WBC normalized and cultures are negative.   Plan: Continue Unasyn 3g IV Q8H Monitor renal fxn, clinical progress, LOT (possible d/c today per PCCM) F/U KCL supplementation   Height: 5\' 2"  (157.5 cm) Weight: 118 lb 6.2 oz (53.7 kg) IBW/kg (Calculated) : 50.1  Temp (24hrs), Avg:98 F (36.7 C), Min:97.5 F (36.4 C), Max:98.8 F (37.1 C)  Recent Labs  Lab 01/28/18 2322 01/28/18 2331 01/29/18 0038 01/29/18 0311 01/29/18 0947 01/30/18 0500 01/31/18 0459 02/01/18 0423 02/03/18 0512  WBC 14.3*  --   --   --   --  17.2* 9.8 8.6  --   CREATININE 0.89  --   --   --   --  0.51 0.51 0.42* 0.39*  LATICACIDVEN  --  8.89* 3.7* 2.5* 1.7  --   --   --   --     Estimated Creatinine Clearance: 54.7 mL/min (A) (by C-G formula based on SCr of 0.39 mg/dL (L)).    Allergies  Allergen Reactions  . Dust Mite Extract Cough    Unasyn 6/25 >>  6/25 CSF: negative 6/25 TA: NRF 6/24 BCx: negative 6/24 UC: neg 6/24 mrsa pcr: neg   Urijah Raynor D. Mina Marble, PharmD, BCPS, [place] Pager:  [phone] 02/03/2018, 12:46 PM

## 2018-02-03 NOTE — Progress Notes (Signed)
Pt continues to retain urine, bladder scan volume >700, insert foley catheter per Dr. Debbora Dus.

## 2018-02-03 NOTE — Progress Notes (Signed)
PA to bedside for portable US-guided thoracentesis.  Limited US Chest demonstrates a small amount of fluid which collects dependent on patient position.   Patient becomes tachypneic and restless with rolling onto left side not improved with additional sedation.  Desats to 82-84%.  Respiratory at bedside.  Discussed with Dr. Debbora Dus, hold on procedure for now.  Patient improved after repositioning and with assistance from RT.   Brynda Greathouse, MS RD PA-C 3:15 PM

## 2018-02-04 LAB — GLUCOSE, CAPILLARY
GLUCOSE-CAPILLARY: 145 mg/dL — AB (ref 70–99)
GLUCOSE-CAPILLARY: 119 mg/dL — AB (ref 70–99)
Glucose-Capillary: 113 mg/dL — ABNORMAL HIGH (ref 70–99)
Glucose-Capillary: 136 mg/dL — ABNORMAL HIGH (ref 70–99)
Glucose-Capillary: 109 mg/dL — ABNORMAL HIGH (ref 70–99)
Glucose-Capillary: 100 mg/dL — ABNORMAL HIGH (ref 70–99)

## 2018-02-04 LAB — CBC WITH DIFFERENTIAL/PLATELET
ABS IMMATURE GRANULOCYTES: 0 10*3/uL (ref 0.0–0.1)
BASOS ABS: 0 10*3/uL (ref 0.0–0.1)
Basophils Relative: 0 %
Eosinophils Absolute: 0.1 10*3/uL (ref 0.0–0.7)
Eosinophils Relative: 2 %
HCT: 35 % — ABNORMAL LOW (ref 36.0–46.0)
HEMOGLOBIN: 11.4 g/dL — AB (ref 12.0–15.0)
Immature Granulocytes: 0 %
LYMPHS ABS: 0.8 10*3/uL (ref 0.7–4.0)
LYMPHS PCT: 14 %
MCH: 33.4 pg (ref 26.0–34.0)
MCHC: 32.6 g/dL (ref 30.0–36.0)
MCV: 102.6 fL — ABNORMAL HIGH (ref 78.0–100.0)
Monocytes Absolute: 0.6 10*3/uL (ref 0.1–1.0)
Monocytes Relative: 10 %
NEUTROS PCT: 74 %
Neutro Abs: 4.1 10*3/uL (ref 1.7–7.7)
Platelets: 236 10*3/uL (ref 150–400)
RBC: 3.41 MIL/uL — AB (ref 3.87–5.11)
RDW: 12.7 % (ref 11.5–15.5)
WBC: 5.6 10*3/uL (ref 4.0–10.5)

## 2018-02-04 LAB — BASIC METABOLIC PANEL
Anion gap: 9 (ref 5–15)
BUN: 8 mg/dL (ref 8–23)
CHLORIDE: 101 mmol/L (ref 98–111)
CO2: 29 mmol/L (ref 22–32)
CREATININE: 0.45 mg/dL (ref 0.44–1.00)
Calcium: 7.9 mg/dL — ABNORMAL LOW (ref 8.9–10.3)
GFR calc Af Amer: >60 mL/min (ref >60)
GFR calc non Af Amer: >60 mL/min (ref >60)
GLUCOSE: 138 mg/dL — AB (ref 70–99)
Potassium: 3.4 mmol/L — ABNORMAL LOW (ref 3.5–5.1)
SODIUM: 139 mmol/L (ref 135–145)

## 2018-02-04 NOTE — Progress Notes (Signed)
Spoke with Dr. Oletta Darter about pulling the R femoral central line d/t the constant oozing and need for dressing changes every couple hours to keep clean, dry, and intact.  D/t the short amount of time she has been off levophed he advised me to wait until at least midnight to see if her BP stays steady, and if so it's okay to pull the line.  I will continue to monitor in the meantime and change the dressing as needed.

## 2018-02-04 NOTE — Progress Notes (Signed)
Central line site noted to be saturated with serous fluid come out of insertion site. Site cleaned, and sterile dressing applied. Will continue to monitor. Lianne Bushy RN BSN

## 2018-02-04 NOTE — Progress Notes (Signed)
PULMONARY / CRITICAL CARE MEDICINE   Name: Samantha Atkins MRN: [id number] DOB: 10-09-1950    ADMISSION DATE:  01/28/2018 CONSULTATION DATE: 01/29/2018     REFERRING MD:  Dr. Venora Maples   CHIEF COMPLAINT:  Seizure   HISTORY OF PRESENT ILLNESS:   67 year old female with PMH of Seizure, Osteoporosis, Depression, GERD, Insomnia  Presents to ED on 6/23 s/p seizure. Per Husband patient was found seizing and unresponsive. When EMS arrived patient was foaming at the mouth and was given 5 mg Versed. On arrival to ED patient continued to have seizures requiring 2 mg Ativan. Head CT negative. Intubated for airway protection. Given 1 mg Keppra. Neurology consulted. PCCM asked to admit.    6/29 The patient remains on the ventilator. Was tried on SBT but became tachypneic this AM. The patient is no longer seizing. Has just come off pressors.  6/30 The patient has been trialed on SBT. Requires at least 12 of PS. She clearly isn't ready for extubation. I asked IR to see the patient for possible thoracentesi byut the collection did not appear very large.    VITAL SIGNS: BP 118/72   Pulse 95   Temp 97.7 F (36.5 C) (Axillary)   Resp (!) 30   Ht 5\' 2"  (1.575 m)   Wt 115 lb 15.4 oz (52.6 kg)   SpO2 99%   BMI 21.21 kg/m      VENTILATOR SETTINGS: Vent Mode: PRVC FiO2 (%):  [30 %] 30 % Set Rate:  [20 bmp] 20 bmp Vt Set:  [400 mL] 400 mL PEEP:  [5 cmH20] 5 cmH20 Plateau Pressure:  [17 cmH20-19 cmH20] 17 cmH20  INTAKE / OUTPUT: I/O last 3 completed shifts: In: 6774.6 [I.V.:2323.2; NG/GT:1200; IV Piggyback:3251.4] Out: 1941 [Urine:5720]  PHYSICAL EXAMINATION: General: Frail female sedated on mechanical ventilatory support HEENT: Bilateral JVD is noted, endotracheal tube in place Neuro: Not sedated follows commands moves arms wiggles toes opens eyes CV: Heart sounds are regular PULM: even/non-labored, lungs bilaterally rhonchi DE:YCXK, non-tender, bsx4 active  Extremities: warm/dry,-  edema  Skin: no rashes or lesions   LABS:  BMET Recent Labs  Lab 01/31/18 0459 02/01/18 0423 02/03/18 0512  NA 137 135 139  K 3.5 3.7 3.3*  CL 112* 110 108  CO2 21* 22 25  BUN 7* 5* <5*  CREATININE 0.51 0.42* 0.39*  GLUCOSE 143* 125* 144*    Electrolytes Recent Labs  Lab 01/31/18 0459 01/31/18 2345 02/01/18 0423 02/03/18 0512  CALCIUM 7.6*  --  7.1* 7.7*  MG 1.6* 1.7 1.8 1.7  PHOS 1.5* 2.6 2.7 2.8    CBC Recent Labs  Lab 01/30/18 0500 01/31/18 0459 02/01/18 0423  WBC 17.2* 9.8 8.6  HGB 11.8* 11.0* 10.9*  HCT 36.0 34.2* 33.2*  PLT 219 206 183    Coag's Recent Labs  Lab 01/28/18 2322  INR 1.06    Sepsis Markers Recent Labs  Lab 01/29/18 0038  01/29/18 0311 01/29/18 0947 01/29/18 1823 01/30/18 0500 01/31/18 0459  LATICACIDVEN 3.7*  --  2.5* 1.7  --   --   --   PROCALCITON  --    < >  --  2.16 1.71 1.26 1.10   < > = values in this interval not displayed.    ABG Recent Labs  Lab 01/29/18 0004 01/29/18 0358 02/03/18 1518  PHART 7.310* 7.354 7.557*  PCO2ART 49.5* 32.6 27.8*  PO2ART 514.0* 155.0* 74.0*    Liver Enzymes Recent Labs  Lab 01/28/18 2322  AST 46*  ALT 43  ALKPHOS 93  BILITOT 0.3  ALBUMIN 3.8    Cardiac Enzymes No results for input(s): TROPONINI, PROBNP in the last 168 hours.  Glucose Recent Labs  Lab 02/03/18 1126 02/03/18 1524 02/03/18 2004 02/03/18 2352 02/04/18 0353 02/04/18 0802  GLUCAP 84 85 104* 147* 145* 119*    Imaging [place] Chest (pleural Effusion)  Result Date: 02/04/2018 CLINICAL DATA:  Pleural effusion, intubated. Thoracentesis requested. EXAM: CHEST ULTRASOUND COMPARISON:  Radiograph from earlier the same day FINDINGS: There is a small effusion at the right lung base. No large pocket for therapeutic thoracentesis. IMPRESSION: Small right pleural effusion.  Thoracentesis deferred. Electronically Signed   By: Lucrezia Europe M.D.   On: 02/04/2018 07:59   Dg Chest Port 1 View  Result Date:  02/03/2018 CLINICAL DATA:  ETT placement evaluation. EXAM: PORTABLE CHEST 1 VIEW COMPARISON:  February 03, 2018 FINDINGS: The ETT and NG tubes are in good position. The cardiomediastinal silhouette is stable. Known left rib fractures. No pneumothorax. Layering effusions and underlying opacities bilaterally are similar since February 01, 2018 but more prominent when compared to February 03, 2018. The difference may be positional. IMPRESSION: 1. Support apparatus as above.  ET tube in good position. 2. Left rib fractures without pneumothorax. 3. Layering bilateral pleural effusions are more prominent since February 03, 2018 but similar since February 01, 2018, possibly due to difference in positioning. Electronically Signed   By: Dorise Bullion III M.D   On: 02/03/2018 11:47     STUDIES:  CXR 6/24 > Normal sized heart. Tortuous and calcified thoracic aorta. Endotracheal tube tip 3.6 cm above the carina. Nasogastric tube extending into the stomach. Bilateral nipple shadows. Clear lungs. Proximal left humerus fixation hardware. CT Head 6/24 > No acute abnormality. 2. Stable diffuse cerebral and cerebellar atrophy. 3. Stable chronic small vessel white matter ischemic changes in both cerebral hemispheres. LT EEG 6/25>>> This is day 2 of intensive EEG monitoring with simultaneous video monitoring is abnormal for several reasons #1 electrographic seizures arising from right anterior frontal and paracentral cortex with subsequent resolution.  Last seizure around 12 noon.  #2 continuous right hemispheric background activity slowing with superimposed right anterior frontal and anterior paracentral spike still present suggestive of neuronal dysfunction and significant cortical irritability in right anterior and paracentral cortex.    CULTURES: Blood 6/24 >> Urine 6/24 >>neg  LP 6/25>>> Sputum 6/25>>>normal flora   ANTIBIOTICS: unasyn 6/25>>>  SIGNIFICANT EVENTS: 6/23 > Presents to ED  6/25 status epilepticus requiring  burst suppression   LINES/TUBES: ETT 6/24 >> R fem CVL 6/24>>>  DISCUSSION: 67yo female with hx seizure disorder admitted with AMS requiring intubation and seizures which were initially controlled. Developed ongoing status 6/25, remains heavily sedated for burst suppression.  Course c/b ?aspiration.  02/02/2018 when sedation is decreased she is awake follows commands but does become agitated   ASSESSMENT / PLAN:  Respiratory Insufficieny s/p Seizure  ?aspiration even 6/25 P:   Wean as tolerated Serial chest x-ray Continue Unasyn for 24-hour. Patient has bilateral effusions bilaterally R>>L.     Will consider CT of [place] to better evaluate the bases. If for some reaon the patient cannot be weaned in the next few days she will likely need a tracheostomy  Hypotension in setting of ?sepsis vs more likely medication effect - SBP 90's at baseline  P:  Wean Levophed as tolerated.  I suspect when we quit sedating her her blood pressure will improve Hypotension has appeared to resolve at  this time.  ? CHF The patient is severl liters ahead in the past few days ( > 5 liters. Her CXR shows bilateral effusion R>L. I am going to put her on some lasix. May need the right chest tapped as well. I have stopped NaCL as well that was running at 100cc/hr. Her BNP came back at > 1300.  Malnutrition  SUP P:   Tube feedings PPI  Normocytic Anemia  No results for input(s): HGB in the last 72 hours.  P:  Trend CBC Pneumatic Air stockings  Leukocytosis  suspected secondary to seizure ??aspiration PNA  Fever  P:   Sputum culture with normal flora from 01/30/2018 Day #4 Unasyn  fever 100. Consider DC Unasyn day 5.  Hyperglycemia    CBG (last 3)  Recent Labs    02/03/18 2352 02/04/18 0353 02/04/18 0802  GLUCAP 147* 145* 119*    P:   SSI     Encephalopathy in setting of Status Epilepticus with breakthrough status  H/O Multiple Falls, Osteo, Seizure on Previous admission 02/2017,  Depression  Status epilepticus  P:   Neurology is involved Day #4 continuous EEG without seizure activity We will DC fentanyl drip and utilize Precedex Continue Keppra, fosphenytoin, Dilantin and phenobarbital per neurology   FAMILY  - Updates: No family at bedside on 02/02/2018 - Inter-disciplinary family meet or Palliative Care meeting due by: 02/05/2018    App cct 30 min   Richardson Landry Minor ACNP Maryanna Shape PCCM Pager [phone] till 1 pm If no answer page [phone] 02/04/2018, 10:59 AM

## 2018-02-04 NOTE — Progress Notes (Signed)
Subjective: Not weaning well, but neuro doing well.   Exam: Vitals:   02/04/18 1115 02/04/18 1121  BP: 119/70 119/70  Pulse: 89   Resp: (!) 31 (!) 27  Temp:    SpO2: 99% 100%   Gen: In bed, intubated Resp: ventilated Abd: soft, nt  Neuro: MS: Awake, alert, follows commands briskly.  CN: Eye movements improving, able to look in both directions today. PERRL, VFF Motor: follows commands x 4, able ot lift all extremities out of bed.  Sensory: as above  Pertinent Labs: PHB pending PHT pending  Impression: 67 yo F with underlying seizure disorder with breakthrough status epilepticus with unclear cause of breakthrough. She initially was controlled with ativan and keppra, but subsequently re-developed status epilepticus about 12 hours after initial presentation. She had cessation of seizure with ativan, but continued with PLEDs until addition of phenobarbital.   She is now much improved. Still weaning on the vent.    Recommendations: 1) continue keppra at 1500mg  BID 2) Continue lyrica 200mg  TID 3) continue phenytoin 200 mg 3 times daily 4) Continue PHB 1mg /kg BID 5) daily levels of PHB and PHT until they stabilize.   Roland Rack, MD [facility] [phone]  If 7pm- 7am, please page neurology on call as listed in [place]. 02/04/2018  11:34 AM

## 2018-02-04 NOTE — Progress Notes (Signed)
Foley noted to be leaking, saturating central line dressing. Balloon deflated with only 8 cc of saline returned. Two cc added back to balloon with original 8. Central line dressing changed with Ander Purpura, RN. Will continue to monitor. Lianne Bushy RN BSN

## 2018-02-04 NOTE — Progress Notes (Signed)
Spoke with Dr. Debbora Dus regarding patients foley continuing to leak despite interventions. New orders to replace foley received and carried out. Central line dressing saturated once again, changed. Dr. Debbora Dus aware, IV team consulted in hopes to remove line. Will continue to monitor. Lianne Bushy RN BSN.

## 2018-02-04 NOTE — Progress Notes (Signed)
10 cc of Versed wasted in sink with Research scientist (medical). Lianne Bushy RN BSN.

## 2018-02-05 ENCOUNTER — Inpatient Hospital Stay (HOSPITAL_COMMUNITY): Payer: Commercial Managed Care - PPO

## 2018-02-05 ENCOUNTER — Other Ambulatory Visit (HOSPITAL_COMMUNITY): Payer: Self-pay

## 2018-02-05 ENCOUNTER — Inpatient Hospital Stay: Payer: Self-pay

## 2018-02-05 DIAGNOSIS — I5030 Unspecified diastolic (congestive) heart failure: Secondary | ICD-10-CM

## 2018-02-05 LAB — GLUCOSE, CAPILLARY
GLUCOSE-CAPILLARY: 107 mg/dL — AB (ref 70–99)
GLUCOSE-CAPILLARY: 106 mg/dL — AB (ref 70–99)
Glucose-Capillary: 117 mg/dL — ABNORMAL HIGH (ref 70–99)
Glucose-Capillary: 96 mg/dL (ref 70–99)
Glucose-Capillary: 106 mg/dL — ABNORMAL HIGH (ref 70–99)

## 2018-02-05 LAB — BASIC METABOLIC PANEL
ANION GAP: 9 (ref 5–15)
BUN: 6 mg/dL — AB (ref 8–23)
CHLORIDE: 102 mmol/L (ref 98–111)
CO2: 28 mmol/L (ref 22–32)
Calcium: 7.8 mg/dL — ABNORMAL LOW (ref 8.9–10.3)
Creatinine, Ser: 0.35 mg/dL — ABNORMAL LOW (ref 0.44–1.00)
GFR calc Af Amer: >60 mL/min (ref >60)
GFR calc non Af Amer: >60 mL/min (ref >60)
GLUCOSE: 146 mg/dL — AB (ref 70–99)
POTASSIUM: 3.2 mmol/L — AB (ref 3.5–5.1)
Sodium: 139 mmol/L (ref 135–145)

## 2018-02-05 LAB — PHENYTOIN LEVEL, TOTAL: PHENYTOIN LVL: 13.3 ug/mL (ref 10.0–20.0)

## 2018-02-05 LAB — PHENOBARBITAL LEVEL: Phenobarbital: 14.3 ug/mL — ABNORMAL LOW (ref 15.0–30.0)

## 2018-02-05 MED ORDER — MAGNESIUM SULFATE 2 GM/50ML IV SOLN
2.0000 g | Freq: Once | INTRAVENOUS | Status: AC
  Administered 2018-02-05: 2 g via INTRAVENOUS
  Filled 2018-02-05: qty 50

## 2018-02-05 MED ORDER — SODIUM CHLORIDE 0.9% FLUSH
10.0000 mL | Freq: Two times a day (BID) | INTRAVENOUS | Status: DC
  Administered 2018-02-05: 20 mL
  Administered 2018-02-06 – 2018-02-11 (×10): 10 mL

## 2018-02-05 MED ORDER — SODIUM CHLORIDE 0.9% FLUSH: 10.0000 mL | INTRAVENOUS | Status: DC | PRN

## 2018-02-05 MED ORDER — CHLORHEXIDINE GLUCONATE CLOTH 2 % EX PADS
6.0000 | MEDICATED_PAD | Freq: Every day | CUTANEOUS | Status: DC
  Administered 2018-02-06 – 2018-02-14 (×8): 6 via TOPICAL

## 2018-02-05 MED ORDER — POTASSIUM CHLORIDE 20 MEQ PO PACK
20.0000 meq | PACK | Freq: Every day | ORAL | Status: DC
  Administered 2018-02-05 – 2018-02-06 (×2): 20 meq via ORAL
  Filled 2018-02-05 (×3): qty 1

## 2018-02-05 MED ORDER — SODIUM CHLORIDE 0.9 % IV SOLN
INTRAVENOUS | Status: DC
  Administered 2018-02-05 – 2018-02-06 (×2): via INTRAVENOUS

## 2018-02-05 NOTE — Progress Notes (Signed)
Pt transported on vent to CT and returned to 4N32. No complications. Pt's vitals remained stable throughout the trip.

## 2018-02-05 NOTE — Progress Notes (Signed)
                    NEURO HOSPITALIST PROGRESS NOTE   Subjective: Patient awake, alert, intubated. On minimal Precedex. Patient will follow commands and nod yes and no. Plan to wean today.   Exam: Vitals:   02/05/18 0900 02/05/18 0927  BP: (!) 100/58 (!) 100/58  Pulse: 93 93  Resp: (!) 21   Temp:    SpO2: 98% 100%    Physical Exam   HEENT-  Normocephalic, no lesions, without obvious abnormality.  Normal external eye and conjunctiva.   Cardiovascular- pulses palpable throughout   Lungs, no excessive working breathing.  Saturations within normal limits, intubated.  Extremities- Warm, dry and intact, edematous Musculoskeletal-no joint tenderness, deformity  Skin-warm and dry,   Neuro:  Mental Status: Alert, awake, intubated, minimal precedex. Follows commands. Cranial Nerves: Patient able to track examiner around room. Pupils equally round and reactive. 3-4 beats of nystagmus noted with EOMI in all directions.  Motor: Able to move all 4 extremities spontaneously and on command.    Medications:  Scheduled: . chlorhexidine gluconate (MEDLINE KIT)  15 mL Mouth Rinse BID  . furosemide  20 mg Intravenous Daily  . heparin injection (subcutaneous)  5,000 Units Subcutaneous Q8H  . insulin aspart  0-15 Units Subcutaneous Q4H  . LORazepam  1 mg Intravenous Q8H  . mouth rinse  15 mL Mouth Rinse 10 times per day  . pantoprazole sodium  40 mg Per Tube Daily  . PHENObarbital  1 mg/kg Intravenous BID  . pregabalin  150 mg Per Tube Q8H   Continuous: . sodium chloride Stopped (02/02/18 1651)  . dexmedetomidine (PRECEDEX) IV infusion 0.2 mcg/kg/hr (02/05/18 0900)  . feeding supplement (OSMOLITE 1.2 CAL) 50 mL/hr at 02/05/18 0900  . levETIRAcetam 1,500 mg (02/04/18 2129)  . norepinephrine (LEVOPHED) Adult infusion Stopped (02/04/18 1804)  . phenytoin (DILANTIN) IV Stopped (02/05/18 0547)   PRN:sodium chloride, acetaminophen (TYLENOL) oral liquid 160 mg/5 mL, midazolam  Pertinent  Labs/Diagnostics:   Ct Chest Wo Contrast  Result Date: 02/05/2018 CLINICAL DATA:  Acute respiratory failure with hypoxia. Pleural effusions. EXAM: CT CHEST WITHOUT CONTRAST TECHNIQUE: Multidetector CT imaging of the chest was performed following the standard protocol without IV contrast. COMPARISON:  Ultrasound dated 02/03/2018 and chest x-rays dated 02/03/2018 and 02/01/2018 and MRI of the abdomen dated 04/07/2017 FINDINGS: Cardiovascular: Heart size is normal. Aortic atherosclerosis. Coronary artery calcifications. Mediastinum/Nodes: No enlarged mediastinal or axillary lymph nodes. Thyroid gland, trachea, and esophagus demonstrate no significant findings. NG tube in the stomach. Endotracheal tube tip 2 cm above the carina. Lungs/Pleura: Large bilateral pleural effusions with complete atelectasis of the right lower lobe and partial atelectasis of left lower lobe. The pulmonary vascularity is normal. No infiltrates. Upper Abdomen: Extensive ascites. Musculoskeletal: There are slight compression fractures of the superior endplates of T4 and T6 with a more extensive compression fracture of T8, unchanged since 2018. Prominent Schmorl's node in the superior endplate of T11. anasarca. IMPRESSION: 1. Large bilateral pleural effusions, slightly greater on the right than the left. 2. Complete compressive atelectasis of the right lower lobe. Partial compressive atelectasis of the left lower lobe. 3. No infiltrates.  Pulmonary vascularity and heart size are normal. 4. Extensive ascites. 5. Anasarca. 6. Multiple compression fractures, most likely all old. Electronically Signed   By: James  Maxwell M.D.   On: 02/05/2018 09:54   Us Chest (pleural Effusion)  Result Date: 02/04/2018 CLINICAL DATA:  Pleural effusion, intubated. Thoracentesis   requested. EXAM: CHEST ULTRASOUND COMPARISON:  Radiograph from earlier the same day FINDINGS: There is a small effusion at the right lung base. No large pocket for therapeutic  thoracentesis. IMPRESSION: Small right pleural effusion.  Thoracentesis deferred. Electronically Signed   By: Lucrezia Europe M.D.   On: 02/04/2018 07:59   Dg Chest Port 1 View  Result Date: 02/03/2018 CLINICAL DATA:  ETT placement evaluation. EXAM: PORTABLE CHEST 1 VIEW COMPARISON:  February 03, 2018 FINDINGS: The ETT and NG tubes are in good position. The cardiomediastinal silhouette is stable. Known left rib fractures. No pneumothorax. Layering effusions and underlying opacities bilaterally are similar since February 01, 2018 but more prominent when compared to February 03, 2018. The difference may be positional. IMPRESSION: 1. Support apparatus as above.  ET tube in good position. 2. Left rib fractures without pneumothorax. 3. Layering bilateral pleural effusions are more prominent since February 03, 2018 but similar since February 01, 2018, possibly due to difference in positioning. Electronically Signed   By: Dorise Bullion III M.D   On: 02/03/2018 11:47   [place] Ekg Site Rite  Result Date: 02/05/2018 If Site Rite image not attached, placement could not be confirmed due to current cardiac rhythm.   Impression: 67 yo F with underlying seizure disorder, presented with breakthrough status epilepticus. Unclear cause of breakthrough. She initially was controlled with ativan and keppra, but subsequently re-developed status epilepticus about 12 hours after initial presentation. She had cessation of seizure with redosed ativan, but continued with PLEDs until addition of phenobarbital.  1. Neuro status is improving.  2. Still weaning off the vent.  Recommendations:  --continue keppra at 1540m BID --Continue lyrica 2080mTID -- continue phenytoin 200 mg 3 times daily --Continue phenobarbital 9m30mg BID --Daily levels of Phenobarb and Phenytoin until they stabilize.  --Albumin level 02/06/18 am.    JesLaurey MoraleSN, NP-C [facility] [phone] minutes of ICU time spent in the neurological evaluation  and management of this critically ill patient.   Electronically signed: Dr. EriKerney Elbe1/2019, 10:30 AM

## 2018-02-05 NOTE — Progress Notes (Signed)
  Echocardiogram 2D Echocardiogram has been performed.  Samantha Atkins 02/05/2018, 5:53 PM

## 2018-02-05 NOTE — Progress Notes (Signed)
Peripherally Inserted Central Catheter/Midline Placement  The IV Nurse has discussed with the patient and/or persons authorized to consent for the patient, the purpose of this procedure and the potential benefits and risks involved with this procedure.  The benefits include less needle sticks, lab draws from the catheter, and the patient may be discharged home with the catheter. Risks include, but not limited to, infection, bleeding, blood clot (thrombus formation), and puncture of an artery; nerve damage and irregular heartbeat and possibility to perform a PICC exchange if needed/ordered by physician.  Alternatives to this procedure were also discussed.  Bard Power PICC patient education guide, fact sheet on infection prevention and patient information card has been provided to patient /or left at bedside.    PICC/Midline Placement Documentation  PICC Double Lumen 16/10/96 Left Basilic 35 cm 0 cm (Active)  Indication for Insertion or Continuance of Line Limited venous access - need for IV therapy >5 days (PICC only) 02/05/2018 10:46 AM  Exposed Catheter (cm) 0 cm 02/05/2018 10:46 AM  Site Assessment Clean;Dry;Intact 02/05/2018 10:46 AM  Lumen #1 Status Flushed;Blood return noted 02/05/2018 10:46 AM  Lumen #2 Status Flushed;Blood return noted 02/05/2018 10:46 AM  Dressing Type Transparent 02/05/2018 10:46 AM  Dressing Status Clean;Dry;Intact;Antimicrobial disc in place 02/05/2018 10:46 AM  Dressing Intervention New dressing 02/05/2018 10:46 AM  Dressing Change Due 02/12/18 02/05/2018 10:46 AM    Consent signed by husband at bedside   Synthia Innocent 02/05/2018, 10:47 AM

## 2018-02-05 NOTE — Progress Notes (Signed)
PULMONARY / CRITICAL CARE MEDICINE   Name: Samantha Atkins MRN: [id number] DOB: 07/26/1951    ADMISSION DATE:  01/28/2018 CONSULTATION DATE: 01/29/2018     REFERRING MD:  Dr. Venora Maples   CHIEF COMPLAINT:  Seizure   HISTORY OF PRESENT ILLNESS:   67 year old female with PMH of Seizure, Osteoporosis, Depression, GERD, Insomnia  Presents to ED on 6/23 s/p seizure. Per Husband patient was found seizing and unresponsive. When EMS arrived patient was foaming at the mouth and was given 5 mg Versed. On arrival to ED patient continued to have seizures requiring 2 mg Ativan. Head CT negative. Intubated for airway protection. Given 1 mg Keppra. Neurology consulted. PCCM asked to admit.    6/29 The patient remains on the ventilator. Was tried on SBT but became tachypneic this AM. The patient is no longer seizing. Has just come off pressors.  7/1 She continues to be intubated mechanically ventilated.  She is off of pressors.  She is sedated on a low-dose of Precedex and appropriately nodding to questions for me.  She has had no overt seizure activity.   VITAL SIGNS: BP (!) 95/55   Pulse 98   Temp 99.3 F (37.4 C) (Axillary)   Resp 20   Ht 5\' 2"  (1.575 m)   Wt 108 lb 3.9 oz (49.1 kg)   SpO2 100%   BMI 19.80 kg/m      VENTILATOR SETTINGS: Vent Mode: PRVC FiO2 (%):  [30 %] 30 % Set Rate:  [20 bmp] 20 bmp Vt Set:  [400 mL] 400 mL PEEP:  [5 cmH20] 5 cmH20 Plateau Pressure:  [15 cmH20-19 cmH20] 15 cmH20  INTAKE / OUTPUT: I/O last 3 completed shifts: In: 3508.5 [I.V.:388.4; NG/GT:2200; IV Piggyback:920.1] Out: 6350 [Urine:6350]  PHYSICAL EXAMINATION: General: Frail-appearing elderly female who is orally intubated mechanically ventilated  Neuro: Nodding appropriately to questions and moving all fours on request  CV: S1 and S2 are regular without murmur rub or gallop, there is only trace dependent edema  PULM: Respirations are unlabored, she is not breathing above the set ventilator rate.   There is symmetric air movement very few scattered rhonchi and no wheezes  GI: The abdomen is flat and soft without any organomegaly masses tenderness guarding or rebound.  She is anicteric.    Extremities: Trace edema  LABS:  BMET Recent Labs  Lab 02/03/18 0512 02/04/18 1206 02/05/18 0452  NA 139 139 139  K 3.3* 3.4* 3.2*  CL 108 101 102  CO2 25 29 28   BUN <5* 8 6*  CREATININE 0.39* 0.45 0.35*  GLUCOSE 144* 138* 146*    Electrolytes Recent Labs  Lab 01/31/18 2345 02/01/18 0423 02/03/18 0512 02/04/18 1206 02/05/18 0452  CALCIUM  --  7.1* 7.7* 7.9* 7.8*  MG 1.7 1.8 1.7  --   --   PHOS 2.6 2.7 2.8  --   --     CBC Recent Labs  Lab 01/31/18 0459 02/01/18 0423 02/04/18 1206  WBC 9.8 8.6 5.6  HGB 11.0* 10.9* 11.4*  HCT 34.2* 33.2* 35.0*  PLT 206 183 236    Coag's No results for input(s): APTT, INR in the last 168 hours.  Sepsis Markers Recent Labs  Lab 01/29/18 1823 01/30/18 0500 01/31/18 0459  PROCALCITON 1.71 1.26 1.10    ABG Recent Labs  Lab 02/03/18 1518  PHART 7.557*  PCO2ART 27.8*  PO2ART 74.0*    Liver Enzymes No results for input(s): AST, ALT, ALKPHOS, BILITOT, ALBUMIN in the last 168  hours.  Cardiac Enzymes No results for input(s): TROPONINI, PROBNP in the last 168 hours.  Glucose Recent Labs  Lab 02/04/18 1634 02/04/18 1914 02/04/18 2307 02/05/18 0305 02/05/18 0748 02/05/18 1147  GLUCAP 136* 109* 100* 117* 107* 96    Imaging Ct Chest Wo Contrast  Result Date: 02/05/2018 CLINICAL DATA:  Acute respiratory failure with hypoxia. Pleural effusions. EXAM: CT CHEST WITHOUT CONTRAST TECHNIQUE: Multidetector CT imaging of the chest was performed following the standard protocol without IV contrast. COMPARISON:  Ultrasound dated 02/03/2018 and chest x-rays dated 02/03/2018 and 02/01/2018 and MRI of the abdomen dated 04/07/2017 FINDINGS: Cardiovascular: Heart size is normal. Aortic atherosclerosis. Coronary artery calcifications.  Mediastinum/Nodes: No enlarged mediastinal or axillary lymph nodes. Thyroid gland, trachea, and esophagus demonstrate no significant findings. NG tube in the stomach. Endotracheal tube tip 2 cm above the carina. Lungs/Pleura: Large bilateral pleural effusions with complete atelectasis of the right lower lobe and partial atelectasis of left lower lobe. The pulmonary vascularity is normal. No infiltrates. Upper Abdomen: Extensive ascites. Musculoskeletal: There are slight compression fractures of the superior endplates of T4 and T6 with a more extensive compression fracture of T8, unchanged since 2018. Prominent Schmorl's node in the superior endplate of Z61. anasarca. IMPRESSION: 1. Large bilateral pleural effusions, slightly greater on the right than the left. 2. Complete compressive atelectasis of the right lower lobe. Partial compressive atelectasis of the left lower lobe. 3. No infiltrates.  Pulmonary vascularity and heart size are normal. 4. Extensive ascites. 5. Anasarca. 6. Multiple compression fractures, most likely all old. Electronically Signed   By: Lorriane Shire M.D.   On: 02/05/2018 09:54   [place] Ekg Site Rite  Result Date: 02/05/2018 If Site Rite image not attached, placement could not be confirmed due to current cardiac rhythm.    STUDIES:  CXR 6/24 > Normal sized heart. Tortuous and calcified thoracic aorta. Endotracheal tube tip 3.6 cm above the carina. Nasogastric tube extending into the stomach. Bilateral nipple shadows. Clear lungs. Proximal left humerus fixation hardware. CT Head 6/24 > No acute abnormality. 2. Stable diffuse cerebral and cerebellar atrophy. 3. Stable chronic small vessel white matter ischemic changes in both cerebral hemispheres. LT EEG 6/25>>> This is day 2 of intensive EEG monitoring with simultaneous video monitoring is abnormal for several reasons #1 electrographic seizures arising from right anterior frontal and paracentral cortex with subsequent resolution.   Last seizure around 12 noon.  #2 continuous right hemispheric background activity slowing with superimposed right anterior frontal and anterior paracentral spike still present suggestive of neuronal dysfunction and significant cortical irritability in right anterior and paracentral cortex.    CULTURES: Blood 6/24 >> Urine 6/24 >>neg  LP 6/25>>> Sputum 6/25>>>normal flora   ANTIBIOTICS: unasyn 6/25>>>  SIGNIFICANT EVENTS: 6/23 > Presents to ED  6/25 status epilepticus requiring burst suppression   LINES/TUBES: ETT 6/24 >> R fem CVL 6/24>>>  DISCUSSION: 67yo female with hx seizure disorder admitted with AMS requiring intubation and seizures which were initially controlled. Developed ongoing status 6/25, remains heavily sedated for burst suppression.  Course c/b ?aspiration.  02/02/2018 when sedation is decreased she is awake follows commands but does become agitated   ASSESSMENT / PLAN:  Respiratory Insufficieny s/p Seizure  ?aspiration even 6/25 P:   CT scan of the chest today  shows moderate to large bilateral pleural effusions with compressive atelectasis.  I am more concerned by the etiology of the infiltrates that I am by theirsize and I am not concerned that they entirely account  for failure to separate from mechanical ventilation.  I have ordered an echocardiogram looking for the etiology of the bilateral symmetric effusions.  Her gas exchange and mechanics otherwise look good and I suspect that we will be able to separate the patient from mechanical ventilation if we manage her anxiety.  I am going to continue her on Precedex as we attempt to separate her from mechanical ventilation   Recent Labs    02/04/18 1206  HGB 11.4*    P:  Trend CBC Pneumatic Air stockings  Leukocytosis  suspected secondary to seizure ??aspiration PNA  Fever  P:   Sputum from 6/25 continues to show normal respiratory flora.  CT scan of the chest today does not show an overt infiltrate there  is some bilateral compressive atelectasis.  I am comfortable with discontinuation of Unasyn  Hyperglycemia    CBG (last 3)  Recent Labs    02/05/18 0305 02/05/18 0748 02/05/18 1147  GLUCAP 117* 107* 96    P:   SSI     Encephalopathy in setting of Status Epilepticus with breakthrough status  H/O Multiple Falls, Osteo, Seizure on Previous admission 02/2017, Depression  Status epilepticus     She is currently seizure-free and interactive on a combination of 5 agents with antiepileptic activity including Keppra, Ativan, Lyrica, phenobarbital, and Dilantin.  Phenobarbital level is slightly low but I have not adjusted this agent as she is currently controlled.     Lars Masson, MD Critical Care Medicine

## 2018-02-06 LAB — VITAMIN B12: VITAMIN B 12: 516 pg/mL (ref 180–914)

## 2018-02-06 LAB — GLUCOSE, CAPILLARY
GLUCOSE-CAPILLARY: 114 mg/dL — AB (ref 70–99)
GLUCOSE-CAPILLARY: 93 mg/dL (ref 70–99)
Glucose-Capillary: 103 mg/dL — ABNORMAL HIGH (ref 70–99)
Glucose-Capillary: 116 mg/dL — ABNORMAL HIGH (ref 70–99)
Glucose-Capillary: 97 mg/dL (ref 70–99)
Glucose-Capillary: 121 mg/dL — ABNORMAL HIGH (ref 70–99)
Glucose-Capillary: 110 mg/dL — ABNORMAL HIGH (ref 70–99)

## 2018-02-06 LAB — ECHOCARDIOGRAM COMPLETE
Height: 62 in
Weight: 1731.93 oz

## 2018-02-06 LAB — MAGNESIUM: MAGNESIUM: 2 mg/dL (ref 1.7–2.4)

## 2018-02-06 LAB — PHENOBARBITAL LEVEL: Phenobarbital: 13.4 ug/mL — ABNORMAL LOW (ref 15.0–30.0)

## 2018-02-06 LAB — ALBUMIN: ALBUMIN: 1.9 g/dL — AB (ref 3.5–5.0)

## 2018-02-06 LAB — TROPONIN I: Troponin I: 0.06 ng/mL (ref <0.03)

## 2018-02-06 LAB — PHENYTOIN LEVEL, TOTAL: Phenytoin Lvl: 16 ug/mL (ref 10.0–20.0)

## 2018-02-06 LAB — FOLATE: Folate: 17.3 ng/mL (ref >5.9)

## 2018-02-06 LAB — POTASSIUM: POTASSIUM: 4.2 mmol/L (ref 3.5–5.1)

## 2018-02-06 MED ORDER — PHENOBARBITAL SODIUM 65 MG/ML IJ SOLN
60.0000 mg | Freq: Two times a day (BID) | INTRAMUSCULAR | Status: DC
  Administered 2018-02-06 – 2018-02-14 (×16): 60 mg via INTRAVENOUS
  Filled 2018-02-06 (×16): qty 1

## 2018-02-06 MED ORDER — THIAMINE HCL 100 MG/ML IJ SOLN
100.0000 mg | Freq: Every day | INTRAMUSCULAR | Status: DC
  Administered 2018-02-06 – 2018-02-14 (×9): 100 mg via INTRAVENOUS
  Filled 2018-02-06 (×9): qty 2

## 2018-02-06 MED ORDER — SODIUM CHLORIDE 0.9 % IV SOLN
200.0000 mg | Freq: Every day | INTRAVENOUS | Status: DC
  Administered 2018-02-07: 200 mg via INTRAVENOUS
  Filled 2018-02-06 (×4): qty 4

## 2018-02-06 MED ORDER — SODIUM CHLORIDE 0.9 % IV SOLN
150.0000 mg | Freq: Two times a day (BID) | INTRAVENOUS | Status: DC
  Administered 2018-02-06 – 2018-02-09 (×6): 150 mg via INTRAVENOUS
  Filled 2018-02-06 (×7): qty 3

## 2018-02-06 MED ORDER — OSMOLITE 1.2 CAL PO LIQD
1000.0000 mL | ORAL | Status: DC
  Administered 2018-02-08: 1000 mL
  Filled 2018-02-06 (×5): qty 1000

## 2018-02-06 MED ORDER — ALBUMIN HUMAN 25 % IV SOLN
25.0000 g | Freq: Four times a day (QID) | INTRAVENOUS | Status: AC
  Administered 2018-02-06 – 2018-02-09 (×12): 25 g via INTRAVENOUS
  Filled 2018-02-06 (×3): qty 100
  Filled 2018-02-06: qty 50
  Filled 2018-02-06 (×3): qty 100
  Filled 2018-02-06: qty 50
  Filled 2018-02-06: qty 100
  Filled 2018-02-06: qty 50
  Filled 2018-02-06 (×2): qty 100
  Filled 2018-02-06 (×2): qty 50
  Filled 2018-02-06: qty 100
  Filled 2018-02-06: qty 50
  Filled 2018-02-06: qty 100

## 2018-02-06 MED ORDER — PRO-STAT SUGAR FREE PO LIQD
30.0000 mL | Freq: Two times a day (BID) | ORAL | Status: DC
  Administered 2018-02-06 – 2018-02-09 (×7): 30 mL
  Filled 2018-02-06 (×7): qty 30

## 2018-02-06 MED ORDER — PHENOBARBITAL SODIUM 65 MG/ML IJ SOLN
20.0000 mg | Freq: Once | INTRAMUSCULAR | Status: AC
  Administered 2018-02-06: 20 mg via INTRAVENOUS
  Filled 2018-02-06: qty 1

## 2018-02-06 MED ORDER — FUROSEMIDE 10 MG/ML IJ SOLN
20.0000 mg | Freq: Two times a day (BID) | INTRAMUSCULAR | Status: DC
  Administered 2018-02-06 – 2018-02-08 (×5): 20 mg via INTRAVENOUS
  Filled 2018-02-06 (×6): qty 2

## 2018-02-06 MED ORDER — NITROGLYCERIN 0.4 MG SL SUBL: 0.4000 mg | SUBLINGUAL_TABLET | SUBLINGUAL | Status: DC | PRN

## 2018-02-06 MED ORDER — ASPIRIN 81 MG PO CHEW
81.0000 mg | CHEWABLE_TABLET | Freq: Every day | ORAL | Status: DC
  Administered 2018-02-07 – 2018-02-14 (×9): 81 mg
  Filled 2018-02-06 (×10): qty 1

## 2018-02-06 NOTE — Progress Notes (Addendum)
MEDICATION RELATED CONSULT NOTE - INITIAL   Pharmacy Consult for Phenytoin and Phenobarbital Indication: Seizures  Allergies  Allergen Reactions  . Dust Mite Extract Cough    Patient Measurements: Height: 5\' 2"  (157.5 cm) Weight: 106 lb 11.2 oz (48.4 kg) IBW/kg (Calculated) : 50.1 Previous Body Weight: 39 kg  Vital Signs: Temp: 99.6 F (37.6 C) (07/02 0804) Temp Source: Axillary (07/02 0804) BP: 87/59 (07/02 1000) Pulse Rate: 88 (07/02 1000) Intake/Output from previous day: 07/01 0701 - 07/02 0700 In: 2271.6 [I.V.:429.7; NG/GT:1290; IV Piggyback:551.9] Out: 3600 [Urine:3600] Intake/Output from this shift: Total I/O In: 312.7 [I.V.:76.3; NG/GT:120; IV Piggyback:116.4] Out: 1000 [Urine:1000]  Labs: Recent Labs    02/04/18 1206 02/05/18 0452 02/06/18 0500  WBC 5.6  --   --   HGB 11.4*  --   --   HCT 35.0*  --   --   PLT 236  --   --   CREATININE 0.45 0.35*  --   ALBUMIN  --   --  1.9*   Estimated Creatinine Clearance: 52.9 mL/min (A) (by C-G formula based on SCr of 0.35 mg/dL (L)).   Microbiology: Recent Results (from the past 720 hour(s))  Urine culture     Status: None   Collection Time: 01/29/18 12:43 AM  Result Value Ref Range Status   Specimen Description URINE, RANDOM  Final   Special Requests NONE  Final   Culture   Final    NO GROWTH Performed at [facility], [address] [place], [place] [address]    Report Status 01/30/2018 FINAL  Final  Blood culture (routine x 2)     Status: None   Collection Time: 01/29/18  1:10 AM  Result Value Ref Range Status   Specimen Description BLOOD RIGHT ANTECUBITAL  Final   Special Requests   Final    BOTTLES DRAWN AEROBIC ONLY Blood Culture results may not be optimal due to an inadequate volume of blood received in culture bottles   Culture   Final    NO GROWTH 5 DAYS Performed at [facility], [place] [address], [place], [place] [address]    Report Status 02/03/2018 FINAL  Final  MRSA PCR  Screening     Status: None   Collection Time: 01/29/18  2:00 AM  Result Value Ref Range Status   MRSA by PCR NEGATIVE NEGATIVE Final    Comment:        The GeneXpert MRSA Assay (FDA approved for NASAL specimens only), is one component of a comprehensive MRSA colonization surveillance program. It is not intended to diagnose MRSA infection nor to guide or monitor treatment for MRSA infections. Performed at [facility], [place] [address], [place], [place] [address]   Blood culture (routine x 2)     Status: None   Collection Time: 01/29/18  3:21 AM  Result Value Ref Range Status   Specimen Description BLOOD RIGHT ARM  Final   Special Requests   Final    BOTTLES DRAWN AEROBIC ONLY Blood Culture adequate volume   Culture   Final    NO GROWTH 5 DAYS Performed at [facility], [place] [address] [place], [place] [address]    Report Status 02/03/2018 FINAL  Final  Culture, respiratory (NON-Expectorated)     Status: None   Collection Time: 01/30/18  8:35 AM  Result Value Ref Range Status   Specimen Description TRACHEAL ASPIRATE  Final   Special Requests Normal  Final   Gram  Stain   Final    RARE WBC PRESENT,BOTH PMN AND MONONUCLEAR NO ORGANISMS SEEN    Culture   Final    Consistent with normal respiratory flora. Performed at [facility], [place] [address] [place], [place] [address]    Report Status 02/01/2018 FINAL  Final  CSF culture     Status: None   Collection Time: 01/30/18  4:43 PM  Result Value Ref Range Status   Specimen Description CSF  Final   Special Requests Normal  Final   Gram Stain   Final    CYTOSPIN SMEAR WBC PRESENT, PREDOMINANTLY MONONUCLEAR NO ORGANISMS SEEN    Culture   Final    NO GROWTH 3 DAYS Performed at [facility], [place] [address] [place], [place] [address]    Report Status 02/03/2018 FINAL  Final    Medical History: Past Medical History:  Diagnosis Date  . Anxiety   . Arthritis    hands  . Depression   . Fracture of  humerus, proximal, left, closed 08/07/2015  . Fracture, humerus closed 07-31-15 fell   left  . GERD (gastroesophageal reflux disease)   . Headache   . Insomnia   . Migraines   . Osteoporosis     Assessment: 67 yo female with seizure disorder came in with status epilepticus. Consulted to adjust phenytoin and phenobarbital to achieve therapeutic levels.  Phenytoin level today was 16, Albumin of 1.9 = Adjusted level of 25.7 Phenobarbital level this am 13.4  Goal of Therapy:  Phenytoin 10 - 20 mcg/ml Phenobarb 15 - 40 mcg/ml  Plan:  Phenytoin: Hold 1400 dose today Decrease daily dose by 100 mg from 200 mg q8hr to -> 150 mg, 200 mg, 150 mg   Phenobarbital: Change dose from 39.65 mg bid to 60 mg bid  Will discontinue qam levels and recheck levels in am on 7/3 per MD orders Monitor for seizures and signs and symptoms of toxicity  Corinda Gubler, PharmD, [place] 02/06/2018,10:23 AM

## 2018-02-06 NOTE — Progress Notes (Addendum)
Subjective: Patient is still intubated, breathing over the vent, she is still on Precedex gtt.  She is easily arousable and able to follow simple commands.  She does point to her chest as though she has some chest pain.  Exam: Vitals:   02/06/18 0900 02/06/18 0951  BP: 125/70 125/70  Pulse: 99 93  Resp: (!) 36 15  Temp:    SpO2: 100% 100%    Physical Exam   HEENT-  Normocephalic, no lesions, without obvious abnormality.  Normal external eye and conjunctiva.   Extremities- Warm, dry and intact Musculoskeletal-no joint tenderness, deformity or swelling Skin-warm and dry, no hyperpigmentation, vitiligo, or suspicious lesions  Neuro:  Mental Status: Intubated but awakes easily.  Follow simple commands such as raising her arms and doing finger-to-nose.  Answers with nodding her head yes or no.  Continues to be on Precedex Cranial Nerves: II: Blinks to threat bilaterally III,IV, VI: Significant nystagmus that is both horizontal and vertical.  Pupils are equal, round, reactive V,VII: Appears symmetrical  VIII: hearing intact to voice Motor: Moving all extremities spontaneously and to command Sensory: Pinprick and light touch intact throughout, bilaterally   Medications:  Scheduled: . chlorhexidine gluconate (MEDLINE KIT)  15 mL Mouth Rinse BID  . Chlorhexidine Gluconate Cloth  6 each Topical Daily  . furosemide  20 mg Intravenous Daily  . heparin injection (subcutaneous)  5,000 Units Subcutaneous Q8H  . insulin aspart  0-15 Units Subcutaneous Q4H  . LORazepam  1 mg Intravenous Q8H  . mouth rinse  15 mL Mouth Rinse 10 times per day  . pantoprazole sodium  40 mg Per Tube Daily  . PHENObarbital  1 mg/kg Intravenous BID  . potassium chloride  20 mEq Oral Daily  . pregabalin  150 mg Per Tube Q8H  . sodium chloride flush  10-40 mL Intracatheter Q12H    Pertinent Labs/Diagnostics: Dilantin corrected is 33.3 Albumin 1.9 Phenobarbital 13.4   Etta Quill PA-C [facility] [phone]   Assessment: 67 year old female with underlying seizure disorder, presented with breakthrough seizures and status epilepticus.  It is unclear the cause of the breakthrough.  She is currently controlled on Dilantin, Keppra, phenobarbital.  At this time her Dilantin level is supratherapeutic at 33.3 and phenobarbital subtherapeutic. Significant nystagmus that is both horizontal and vertical is noted on exam, which is most likely due to her supratherapeutic Dilantin level.   Recommendations: -Holding Dilantin. Restart when level drops below 20. Pharmacy now to dose.  -Have pharmacy also dose phenobarbital as it is subtherapeutic -Continue Lyrica 200 mg 3 times daily -Continue Keppra at 1500 mg twice daily   Electronically signed: Dr. Kerney Elbe 02/06/2018, 10:08 AM

## 2018-02-06 NOTE — Progress Notes (Signed)
[facility] Progress Note Patient Name: Samantha Atkins DOB: [birth year] MRN: [id number]   Date of Service  02/06/2018  HPI/Events of Note  Chest pain - Now resolved. EKG reveals NSR with T wave abnormality c/w anterolateral ischemia and prolonged QT interval. Troponin #1 = 0.06.   eICU Interventions  Will order: 1. ASA 81 mg PO now and Q day.  2. Continue to trend Troponin.      Intervention Category Major Interventions: Other:  Lysle Dingwall 02/06/2018, 11:19 PM

## 2018-02-06 NOTE — Progress Notes (Signed)
PULMONARY / CRITICAL CARE MEDICINE   Name: Samantha Atkins MRN: [id number] DOB: 1951-06-06    ADMISSION DATE:  01/28/2018 CONSULTATION DATE: 01/29/2018     REFERRING MD:  Dr. Venora Maples   CHIEF COMPLAINT:  Seizure   HISTORY OF PRESENT ILLNESS:   67 year old female with PMH of Seizure, Osteoporosis, Depression, GERD, Insomnia  Presents to ED on 6/23 s/p seizure. Per Husband patient was found seizing and unresponsive. When EMS arrived patient was foaming at the mouth and was given 5 mg Versed. On arrival to ED patient continued to have seizures requiring 2 mg Ativan. Head CT negative. Intubated for airway protection. Given 1 mg Keppra. Neurology consulted. PCCM asked to admit.    6/29 The patient remains on the ventilator. Was tried on SBT but became tachypneic this AM. The patient is no longer seizing. Has just come off pressors.  7/1 She continues to be intubated mechanically ventilated.  She is off of pressors.  She is sedated on a low-dose of Precedex and appropriately nodding to questions for me.  She has had no overt seizure activity.  7/2 she continues to be intubated and mechanically ventilated.  She is on no pressors.  Her Precedex was held prior to my examination and she was nodding appropriately to questions.  She was tolerating a pressure support of 9.   VITAL SIGNS: BP (!) 87/59   Pulse 88   Temp 99.3 F (37.4 C) (Axillary)   Resp 15   Ht 5\' 2"  (1.575 m)   Wt 106 lb 11.2 oz (48.4 kg)   SpO2 100%   BMI 19.52 kg/m      VENTILATOR SETTINGS: Vent Mode: CPAP;PSV FiO2 (%):  [30 %] 30 % Set Rate:  [20 bmp] 20 bmp Vt Set:  [400 mL] 400 mL PEEP:  [5 cmH20] 5 cmH20 Pressure Support:  [5 cmH20-12 cmH20] 12 cmH20 Plateau Pressure:  [13 cmH20-15 cmH20] 15 cmH20  INTAKE / OUTPUT: I/O last 3 completed shifts: In: 3288.5 [I.V.:488.5; NG/GT:1940; IV Piggyback:860] Out: 7048 [Urine:5230]  PHYSICAL EXAMINATION: General: Is a very frail-appearing woman who appears to be  somewhat older than her stated age.  She is orally intubated and mechanically ventilated  Neuro: Nodding appropriately to questions and following simple instructions.    CV: S1 and S2 are regular without murmur rub or gallop, there is no JVD.  There is trace anasarca  PULM: Aspirations are unlabored on a pressure support of 9.  There is symmetric air movement and no wheezes.  I do not detect any dependent rales.   GI: The abdomen is flat and soft without any organomegaly masses tenderness guarding or rebound.  She is anicteric.      Extremities: Trace edema  LABS:  BMET Recent Labs  Lab 02/03/18 0512 02/04/18 1206 02/05/18 0452  NA 139 139 139  K 3.3* 3.4* 3.2*  CL 108 101 102  CO2 25 29 28   BUN <5* 8 6*  CREATININE 0.39* 0.45 0.35*  GLUCOSE 144* 138* 146*    Electrolytes Recent Labs  Lab 01/31/18 2345 02/01/18 0423 02/03/18 0512 02/04/18 1206 02/05/18 0452  CALCIUM  --  7.1* 7.7* 7.9* 7.8*  MG 1.7 1.8 1.7  --   --   PHOS 2.6 2.7 2.8  --   --     CBC Recent Labs  Lab 01/31/18 0459 02/01/18 0423 02/04/18 1206  WBC 9.8 8.6 5.6  HGB 11.0* 10.9* 11.4*  HCT 34.2* 33.2* 35.0*  PLT 206 183 236  Coag's No results for input(s): APTT, INR in the last 168 hours.  Sepsis Markers Recent Labs  Lab 01/31/18 0459  PROCALCITON 1.10    ABG Recent Labs  Lab 02/03/18 1518  PHART 7.557*  PCO2ART 27.8*  PO2ART 74.0*    Liver Enzymes Recent Labs  Lab 02/06/18 0500  ALBUMIN 1.9*    Cardiac Enzymes No results for input(s): TROPONINI, PROBNP in the last 168 hours.  Glucose Recent Labs  Lab 02/05/18 1546 02/05/18 2018 02/05/18 2359 02/06/18 0318 02/06/18 0754 02/06/18 1128  GLUCAP 106* 106* 114* 103* 116* 93    Imaging No results found.   STUDIES:  CXR 6/24 > Normal sized heart. Tortuous and calcified thoracic aorta. Endotracheal tube tip 3.6 cm above the carina. Nasogastric tube extending into the stomach. Bilateral nipple shadows. Clear  lungs. Proximal left humerus fixation hardware. CT Head 6/24 > No acute abnormality. 2. Stable diffuse cerebral and cerebellar atrophy. 3. Stable chronic small vessel white matter ischemic changes in both cerebral hemispheres. LT EEG 6/25>>> This is day 2 of intensive EEG monitoring with simultaneous video monitoring is abnormal for several reasons #1 electrographic seizures arising from right anterior frontal and paracentral cortex with subsequent resolution.  Last seizure around 12 noon.  #2 continuous right hemispheric background activity slowing with superimposed right anterior frontal and anterior paracentral spike still present suggestive of neuronal dysfunction and significant cortical irritability in right anterior and paracentral cortex.    CULTURES: Blood 6/24 >> Urine 6/24 >>neg  LP 6/25>>> Sputum 6/25>>>normal flora   ANTIBIOTICS: unasyn 6/25>>>  SIGNIFICANT EVENTS: 6/23 > Presents to ED  6/25 status epilepticus requiring burst suppression   LINES/TUBES: ETT 6/24 >> R fem CVL 6/24>>>  DISCUSSION: 67yo female with hx seizure disorder admitted with AMS requiring intubation for administration of anticonvulsants.   Developed ongoing status 6/25, she was heavily sedated to burst suppression.    ASSESSMENT / PLAN:  Respiratory Insufficieny s/p Seizure  ?aspiration even 6/25 P:   CT scan of the chest today  shows moderate to large bilateral pleural effusions with compressive atelectasis.  I am more concerned by the etiology of the effusions that I am by there is size.  An echocardiogram has been performed but not yet interpreted.  I also note that she is substantially hypo-albumin anemic.  For now I am going to continue to diurese the patient in fact they have increased her diuretic, and added albumin to facilitate fluid removal.  Gas exchange and mechanics look reasonably good but she failed a spontaneous breathing trial yesterday.  We are attempting another spontaneous  breathing trial today  Recent Labs    02/04/18 1206  HGB 11.4*    P:  Trend CBC Pneumatic Air stockings  Leukocytosis  suspected secondary to seizure ??aspiration PNA  Fever  P:   Sputum from 6/25 continues to show normal respiratory flora.  CT scan of the chest 7/1 does not show an overt infiltrate there is some bilateral compressive atelectasis.  I am comfortable with discontinuation of Unasyn  Hyperglycemia    CBG (last 3)  Recent Labs    02/06/18 0318 02/06/18 0754 02/06/18 1128  GLUCAP 103* 116* 93    P:   SSI     Encephalopathy in setting of Status Epilepticus with breakthrough status  H/O Multiple Falls, Osteo, Seizure on Previous admission 02/2017, Depression  Status epilepticus     She is currently seizure-free and interactive on a combination of 5 agents with antiepileptic activity including Keppra, Ativan,  Lyrica, phenobarbital, and Dilantin.  Phenobarbital level is slightly low but I have not adjusted this agent and she is currently controlled.  With her macrocytosis and abnormal LFTs I question the history of alcohol use.  I have added a dose of thiamine and will be tested in the patient when she is able to speak.     Lars Masson, MD Critical Care Medicine

## 2018-02-06 NOTE — Progress Notes (Signed)
Nutrition Follow-up  DOCUMENTATION CODES:   Severe malnutrition in context of chronic illness, Underweight  INTERVENTION:   Decrease Osmolite 1.2 to 40 ml/hr (960 ml/day) Add 30 ml Prostat BID  Provides: 1352 kcal, 83 grams protein, and 778 ml free water.    NUTRITION DIAGNOSIS:   Severe Malnutrition related to chronic illness as evidenced by severe muscle depletion, severe fat depletion. Ongoing.   GOAL:   Patient will meet greater than or equal to 90% of their needs Met.   MONITOR:   Vent status, I & O's, TF tolerance  ASSESSMENT:   Pt with PMH of Sz, depression, GERD, insomnia, osteoporosis admitted 6/23 s/p seizure.   Pt discussed during ICU rounds and with RN.  Pt awake and alert during my visit and weaning with hopeful extubation today, per chart review pt now back on full support this afternoon due to no respiratory drive.    Patient is currently intubated on ventilator support MV: 6.2 L/min Temp (24hrs), Avg:98.9 F (37.2 C), Min:98.3 F (36.8 C), Max:99.6 F (37.6 C)  Medications reviewed and include: lasix, SSI, thiamine Phenobarbital 60 mg BID Labs reviewed: K+ 3.2 (L), vitamin B12: 516, Folate: 17.3  Pt is 11 L positive since admission; weight has increased currently 21 lb above admission weight of 85 lb (38.8 kg) with mild to moderate edema  UOP: 3600 ml x 24 hr   OG tube: Osmolite 1.2 @ 50 ml/hr Provides: 1440 kcal, 67 grams protein, and 973 ml free water    Diet Order:   Diet Order    None      EDUCATION NEEDS:   No education needs have been identified at this time  Skin:  Skin Assessment: Reviewed RN Assessment  Last BM:  7/1 small  Height:   Ht Readings from Last 1 Encounters:  02/05/18 _0  (1.575 m)    Weight:   Wt Readings from Last 1 Encounters:  02/06/18 106 lb 11.2 oz (48.4 kg)    Ideal Body Weight:  50 kg  BMI:  Body mass index is 19.52 kg/m.  Estimated Nutritional Needs:   Kcal:  1425  Protein:  65-80  grams  Fluid:  > 1.5 L/day  Maylon Peppers RD, LDN, CNSC [phone] Pager [phone] After Hours Pager

## 2018-02-06 NOTE — Progress Notes (Signed)
eLink Physician-Brief Progress Note Patient Name: Samantha Atkins DOB: [birth year] MRN: [id number]   Date of Service  02/06/2018  HPI/Events of Note  Patient c/o chest pain. Intubated and ventilated. Hx of bilateral pleural effusions.   eICU Interventions  Will order: 1. 12 Lead EKG STAT. 2. Cycle Troponin. 3. NTG 0.4 mg SL x 1.     Intervention Category Major Interventions: Other:  Gurnie Duris Cornelia Copa 02/06/2018, 9:08 PM

## 2018-02-06 NOTE — Progress Notes (Signed)
Pt placed back on full vent support due to no pt effort - found vent in back-up mode.  RN and MD notified.

## 2018-02-07 LAB — BLOOD GAS, ARTERIAL
Acid-Base Excess: 6 mmol/L — ABNORMAL HIGH (ref 0.0–2.0)
Bicarbonate: 29.2 mmol/L — ABNORMAL HIGH (ref 20.0–28.0)
Drawn by: 347191
FIO2: 30
MECHVT: 400 mL
O2 Saturation: 97.6 %
PEEP: 5 cmH2O
Patient temperature: 97.7
RATE: 20 {breaths}/min
pCO2 arterial: 35.5 mmHg (ref 32.0–48.0)
pH, Arterial: 7.523 — ABNORMAL HIGH (ref 7.350–7.450)
pO2, Arterial: 90.4 mmHg (ref 83.0–108.0)

## 2018-02-07 LAB — CBC WITH DIFFERENTIAL/PLATELET
Abs Immature Granulocytes: 0 10*3/uL (ref 0.0–0.1)
BASOS ABS: 0 10*3/uL (ref 0.0–0.1)
Basophils Relative: 1 %
EOS PCT: 3 %
Eosinophils Absolute: 0.1 10*3/uL (ref 0.0–0.7)
HCT: 28.3 % — ABNORMAL LOW (ref 36.0–46.0)
Hemoglobin: 9 g/dL — ABNORMAL LOW (ref 12.0–15.0)
Immature Granulocytes: 1 %
LYMPHS PCT: 30 %
Lymphs Abs: 1.2 10*3/uL (ref 0.7–4.0)
MCH: 33.5 pg (ref 26.0–34.0)
MCHC: 31.8 g/dL (ref 30.0–36.0)
MCV: 105.2 fL — ABNORMAL HIGH (ref 78.0–100.0)
Monocytes Absolute: 0.3 10*3/uL (ref 0.1–1.0)
Monocytes Relative: 9 %
NEUTROS ABS: 2.3 10*3/uL (ref 1.7–7.7)
Neutrophils Relative %: 58 %
PLATELETS: 193 10*3/uL (ref 150–400)
RBC: 2.69 MIL/uL — AB (ref 3.87–5.11)
RDW: 12.8 % (ref 11.5–15.5)
WBC: 3.9 10*3/uL — AB (ref 4.0–10.5)

## 2018-02-07 LAB — BASIC METABOLIC PANEL WITH GFR
Anion gap: 8 (ref 5–15)
BUN: 9 mg/dL (ref 8–23)
CO2: 29 mmol/L (ref 22–32)
Calcium: 8.2 mg/dL — ABNORMAL LOW (ref 8.9–10.3)
Chloride: 104 mmol/L (ref 98–111)
Creatinine, Ser: 0.42 mg/dL — ABNORMAL LOW (ref 0.44–1.00)
GFR calc Af Amer: >60 mL/min
GFR calc non Af Amer: >60 mL/min
Glucose, Bld: 130 mg/dL — ABNORMAL HIGH (ref 70–99)
Potassium: 3.3 mmol/L — ABNORMAL LOW (ref 3.5–5.1)
Sodium: 141 mmol/L (ref 135–145)

## 2018-02-07 LAB — PHENOBARBITAL LEVEL: Phenobarbital: 16.4 ug/mL (ref 15.0–30.0)

## 2018-02-07 LAB — GLUCOSE, CAPILLARY
GLUCOSE-CAPILLARY: 110 mg/dL — AB (ref 70–99)
GLUCOSE-CAPILLARY: 117 mg/dL — AB (ref 70–99)
GLUCOSE-CAPILLARY: 122 mg/dL — AB (ref 70–99)
GLUCOSE-CAPILLARY: 109 mg/dL — AB (ref 70–99)
Glucose-Capillary: 113 mg/dL — ABNORMAL HIGH (ref 70–99)

## 2018-02-07 LAB — MAGNESIUM: MAGNESIUM: 1.7 mg/dL (ref 1.7–2.4)

## 2018-02-07 LAB — TROPONIN I
TROPONIN I: 0.06 ng/mL — AB (ref <0.03)
Troponin I: 0.07 ng/mL

## 2018-02-07 LAB — ALBUMIN: Albumin: 3.1 g/dL — ABNORMAL LOW (ref 3.5–5.0)

## 2018-02-07 LAB — PHENYTOIN LEVEL, TOTAL: Phenytoin Lvl: 13.6 ug/mL (ref 10.0–20.0)

## 2018-02-07 MED ORDER — POTASSIUM CHLORIDE 20 MEQ PO PACK
20.0000 meq | PACK | Freq: Two times a day (BID) | ORAL | Status: DC
  Administered 2018-02-07 – 2018-02-08 (×4): 20 meq
  Filled 2018-02-07 (×4): qty 1

## 2018-02-07 MED ORDER — SODIUM CHLORIDE 0.9 % IV BOLUS
1000.0000 mL | Freq: Once | INTRAVENOUS | Status: AC
  Administered 2018-02-07: 1000 mL via INTRAVENOUS

## 2018-02-07 NOTE — Progress Notes (Signed)
Pt was found on C/PS mode.  RN states that MD changed the vent settings about 0900.

## 2018-02-07 NOTE — Progress Notes (Signed)
CRITICAL VALUE ALERT  Critical Value:  Troponin 0.06  Date & Time Notied:  02/06/18 2310  Provider Notified: Dr. Oletta Darter  Orders Received/Actions taken: awaiting call back.

## 2018-02-07 NOTE — Progress Notes (Signed)
Patient complaining of chest pain and wrote on paper "feels like gravel in my chest and is constant". Dr. Oletta Darter made aware. EKG completed. Will draw serial troponins and continue to monitor. After order received for nitro prn, patient stated she had no pain and began falling asleep.

## 2018-02-07 NOTE — Progress Notes (Signed)
MEDICATION RELATED CONSULT NOTE  Pharmacy Consult for Phenytoin and Phenobarbital Indication: Seizures   Patient Measurements: Height: 5\' 2"  (157.5 cm) Weight: 102 lb 4.7 oz (46.4 kg) IBW/kg (Calculated) : 50.1   Vital Signs: Temp: 97.8 F (36.6 C) (07/03 1200) Temp Source: Axillary (07/03 1200) BP: 102/79 (07/03 1300) Pulse Rate: 98 (07/03 1300) Intake/Output from previous day: 07/02 0701 - 07/03 0700 In: 2982.2 [I.V.:1373.7; NG/GT:926.5; IV Piggyback:682] Out: 5960 [Urine:5960] Intake/Output from this shift: Total I/O In: 490.1 [I.V.:66.9; NG/GT:240; IV Piggyback:183.2] Out: 1975 [XKPVV:7482]   Labs: Recent Labs    02/05/18 0452 02/06/18 0500 02/06/18 1221 02/07/18 0343  WBC  --   --   --  3.9*  HGB  --   --   --  9.0*  HCT  --   --   --  28.3*  PLT  --   --   --  193  CREATININE 0.35*  --   --  0.42*  MG  --   --  2.0 1.7  ALBUMIN  --  1.9*  --  3.1*    Assessment: 67 yo female with seizure disorder came in with status epilepticus. Consulted to adjust phenytoin and phenobarbital to achieve therapeutic levels.  The corrected phenytoin level today is therapeutic. Yesterday's albumin 1.9 does not seem to correlate with previous albumin levels. Phenobarbital level is therapeutic.   Goal of Therapy:  Phenytoin 10 - 20 mcg/ml Phenobarb 15 - 40 mcg/ml   Plan:  -Continue phenytoin 150 mg in the morning, 200 mg at lunch, 150 mg in the evening  -Continue phenobarbital 60 mg po bid  -Monitor for seizures and signs and symptoms of toxicity -Consider checking another phenobarb level in 2-3 days, and another phenytoin level in 5-7 days   Harvel Quale 02/07/2018 1:44 PM

## 2018-02-07 NOTE — Progress Notes (Signed)
[facility] Progress Note Patient Name: Samantha Atkins DOB: [birth year] MRN: [id number]   Date of Service  02/07/2018  HPI/Events of Note  Hypotension - BP = 79/47 with MAP = 58. Precedex IV infusion on hold. LVEF = 50% to 55%.  eICU Interventions  Will order: 1. Bolus with 0.9 NaCl 1 liter IV over 1 hour now.  2. Monitor CVP now and Q 4 hours.      Intervention Category Major Interventions: Hypotension - evaluation and management  [name] Eugene 02/07/2018, 2:09 AM

## 2018-02-07 NOTE — Progress Notes (Signed)
PULMONARY / CRITICAL CARE MEDICINE   Name: Samantha Atkins MRN: [id number] DOB: Jun 27, 1951    ADMISSION DATE:  01/28/2018 CONSULTATION DATE: 01/29/2018     REFERRING MD:  Dr. Venora Maples   CHIEF COMPLAINT:  Seizure   HISTORY OF PRESENT ILLNESS:   67 year old female with PMH of Seizure, Osteoporosis, Depression, GERD, Insomnia  Presents to ED on 6/23 s/p seizure. Per Husband patient was found seizing and unresponsive. When EMS arrived patient was foaming at the mouth and was given 5 mg Versed. On arrival to ED patient continued to have seizures requiring 2 mg Ativan. Head CT negative. Intubated for airway protection. Given 1 mg Keppra. Neurology consulted. PCCM asked to admit.    6/29 The patient remains on the ventilator. Was tried on SBT but became tachypneic this AM. The patient is no longer seizing. Has just come off pressors.  7/1 She continues to be intubated mechanically ventilated.  She is off of pressors.  She is sedated on a low-dose of Precedex and appropriately nodding to questions for me.  She has had no overt seizure activity.  7/2 she continues to be intubated and mechanically ventilated.  She is on no pressors.  Her Precedex was held prior to my examination and she was nodding appropriately to questions.  She was tolerating a pressure support of 9.  7/3 some hypotension during sleep last night for which she was given a bolus of fluid.  She is very alert and interactive this morning and not complaining of dyspnea after I switched her to CPAP with pressure support of 10.  No further seizure activity.   VITAL SIGNS: BP 109/60 (BP Location: Left Arm)   Pulse 97   Temp 98.9 F (37.2 C) (Axillary)   Resp 20   Ht 5\' 2"  (1.575 m)   Wt 102 lb 4.7 oz (46.4 kg)   SpO2 100%   BMI 18.71 kg/m     CVP:  [4 mmHg-8 mmHg] 4 mmHgVENTILATOR SETTINGS: Vent Mode: PRVC FiO2 (%):  [30 %] 30 % Set Rate:  [20 bmp] 20 bmp Vt Set:  [400 mL] 400 mL PEEP:  [5 cmH20] 5 cmH20 Pressure  Support:  [12 cmH20] 12 cmH20 Plateau Pressure:  [13 cmH20-15 cmH20] 14 cmH20  INTAKE / OUTPUT: I/O last 3 completed shifts: In: 4177.6 [I.V.:1661; NG/GT:1526.5; IV Piggyback:990.1] Out: 7235 [Urine:7235]  PHYSICAL EXAMINATION: General: This is a frail lady female who is orally intubated mechanically ventilated and very alert, writing notes and sending text on the phone.   Neuro: Nodding appropriately to questions and following simple instructions.    CV: S1 and S2 are regular without murmur rub or gallop.  There is no JVD.  There continues to be trace anasarca.    PULM: Respirations are unlabored, there is symmetric air movement, lots of scattered rhonchi and no wheezes.     GI: The abdomen is flat and soft without any organomegaly masses tenderness guarding or rebound.  She is anicteric.  There is an old well-healed infraumbilical scar.  There is a line in the right groin with an opaque dressing.        Extremities: Trace edema  LABS:  BMET Recent Labs  Lab 02/04/18 1206 02/05/18 0452 02/06/18 1221 02/07/18 0343  NA 139 139  --  141  K 3.4* 3.2* 4.2 3.3*  CL 101 102  --  104  CO2 29 28  --  29  BUN 8 6*  --  9  CREATININE 0.45 0.35*  --  0.42*  GLUCOSE 138* 146*  --  130*    Electrolytes Recent Labs  Lab 01/31/18 2345  02/01/18 0423 02/03/18 0512 02/04/18 1206 02/05/18 0452 02/06/18 1221 02/07/18 0343  CALCIUM  --    < > 7.1* 7.7* 7.9* 7.8*  --  8.2*  MG 1.7  --  1.8 1.7  --   --  2.0 1.7  PHOS 2.6  --  2.7 2.8  --   --   --   --    < > = values in this interval not displayed.    CBC Recent Labs  Lab 02/01/18 0423 02/04/18 1206 02/07/18 0343  WBC 8.6 5.6 3.9*  HGB 10.9* 11.4* 9.0*  HCT 33.2* 35.0* 28.3*  PLT 183 236 193    Coag's No results for input(s): APTT, INR in the last 168 hours.  Sepsis Markers No results for input(s): LATICACIDVEN, PROCALCITON, O2SATVEN in the last 168 hours.  ABG Recent Labs  Lab 02/03/18 1518 02/07/18 0357  PHART  7.557* 7.523*  PCO2ART 27.8* 35.5  PO2ART 74.0* 90.4    Liver Enzymes Recent Labs  Lab 02/06/18 0500 02/07/18 0343  ALBUMIN 1.9* 3.1*    Cardiac Enzymes Recent Labs  Lab 02/06/18 2204 02/07/18 0343  TROPONINI 0.06* 0.06*    Glucose Recent Labs  Lab 02/06/18 1128 02/06/18 1520 02/06/18 2111 02/06/18 2322 02/07/18 0336 02/07/18 0822  GLUCAP 93 97 121* 110* 113* 110*    Imaging No results found.   STUDIES:  CXR 6/24 > Normal sized heart. Tortuous and calcified thoracic aorta. Endotracheal tube tip 3.6 cm above the carina. Nasogastric tube extending into the stomach. Bilateral nipple shadows. Clear lungs. Proximal left humerus fixation hardware. CT Head 6/24 > No acute abnormality. 2. Stable diffuse cerebral and cerebellar atrophy. 3. Stable chronic small vessel white matter ischemic changes in both cerebral hemispheres. LT EEG 6/25>>> This is day 2 of intensive EEG monitoring with simultaneous video monitoring is abnormal for several reasons #1 electrographic seizures arising from right anterior frontal and paracentral cortex with subsequent resolution.  Last seizure around 12 noon.  #2 continuous right hemispheric background activity slowing with superimposed right anterior frontal and anterior paracentral spike still present suggestive of neuronal dysfunction and significant cortical irritability in right anterior and paracentral cortex.    CULTURES: Blood 6/24 >> Urine 6/24 >>neg  LP 6/25>>> Sputum 6/25>>>normal flora   ANTIBIOTICS: unasyn 6/25>>>  SIGNIFICANT EVENTS: 6/23 > Presents to ED  6/25 status epilepticus requiring burst suppression   LINES/TUBES: ETT 6/24 >> R fem CVL 6/24>>>  DISCUSSION: 66yo female with hx seizure disorder admitted with AMS requiring intubation for administration of anticonvulsants.   Developed ongoing status 6/25, she was heavily sedated to burst suppression.    ASSESSMENT / PLAN:  Respiratory Insufficieny s/p  Seizure  ?aspiration even 6/25 P:   CT scan of the chest showed moderate to large bilateral pleural effusions with compressive atelectasis.  I am more concerned by the etiology of the effusions that I am by their size.  An echocardiogram has been performed showing normal LV systolic function.  I also note that she is substantially hypo-albuminemic.  I am continuing diuresis, I am aware that she required a fluid bolus for hypotension while sleeping last night if this becomes a recurrent issue I will increase her dose of albumin.  I am hopeful that with a net diuresis of 2 L in the past 24 hours will be able to make some progress in transferring work of breathing  to the patient today.   Recent Labs    02/04/18 1206 02/07/18 0343  HGB 11.4* 9.0*    P:  Trend CBC Pneumatic Air stockings  Leukocytosis  suspected secondary to seizure ??aspiration PNA  Fever  P:   Sputum from 6/25 continues to show normal respiratory flora.  CT scan of the chest 7/1 does not show an overt infiltrate there is some bilateral compressive atelectasis.  She is off antibiotics   Hyperglycemia    CBG (last 3)  Recent Labs    02/06/18 2322 02/07/18 0336 02/07/18 0822  GLUCAP 110* 113* 110*    P:   SSI     Encephalopathy in setting of Status Epilepticus with breakthrough status  H/O Multiple Falls, Osteo, Seizure on Previous admission 02/2017, Depression  Status epilepticus     She is currently seizure-free and interactive on a combination of 5 agents with antiepileptic activity including Keppra, Ativan, Lyrica, phenobarbital, and Dilantin.  Phenobarbital level is slightly low but I have not adjusted this agent and she is currently controlled.  With her macrocytosis and abnormal LFTs I question the history of alcohol use.  I have added a dose of thiamine and will getting a better history once the patient able to speak.     Lars Masson, MD Critical Care Medicine

## 2018-02-08 ENCOUNTER — Other Ambulatory Visit (HOSPITAL_COMMUNITY): Payer: Self-pay

## 2018-02-08 ENCOUNTER — Inpatient Hospital Stay (HOSPITAL_COMMUNITY): Payer: Commercial Managed Care - PPO

## 2018-02-08 LAB — CBC WITH DIFFERENTIAL/PLATELET
ABS IMMATURE GRANULOCYTES: 0 10*3/uL (ref 0.0–0.1)
BASOS ABS: 0 10*3/uL (ref 0.0–0.1)
BASOS PCT: 1 %
Eosinophils Absolute: 0.1 10*3/uL (ref 0.0–0.7)
Eosinophils Relative: 4 %
HCT: 24.8 % — ABNORMAL LOW (ref 36.0–46.0)
HEMOGLOBIN: 7.9 g/dL — AB (ref 12.0–15.0)
IMMATURE GRANULOCYTES: 1 %
LYMPHS PCT: 25 %
Lymphs Abs: 0.8 10*3/uL (ref 0.7–4.0)
MCH: 34.2 pg — AB (ref 26.0–34.0)
MCHC: 31.9 g/dL (ref 30.0–36.0)
MCV: 107.4 fL — AB (ref 78.0–100.0)
MONO ABS: 0.3 10*3/uL (ref 0.1–1.0)
Monocytes Relative: 9 %
NEUTROS ABS: 1.9 10*3/uL (ref 1.7–7.7)
Neutrophils Relative %: 60 %
Platelets: 180 10*3/uL (ref 150–400)
RBC: 2.31 MIL/uL — AB (ref 3.87–5.11)
RDW: 12.9 % (ref 11.5–15.5)
WBC: 3.1 10*3/uL — AB (ref 4.0–10.5)

## 2018-02-08 LAB — BASIC METABOLIC PANEL
Anion gap: 7 (ref 5–15)
BUN: 11 mg/dL (ref 8–23)
CALCIUM: 7.7 mg/dL — AB (ref 8.9–10.3)
CO2: 27 mmol/L (ref 22–32)
Chloride: 109 mmol/L (ref 98–111)
Creatinine, Ser: 0.39 mg/dL — ABNORMAL LOW (ref 0.44–1.00)
GFR calc Af Amer: >60 mL/min (ref >60)
GLUCOSE: 125 mg/dL — AB (ref 70–99)
POTASSIUM: 2.9 mmol/L — AB (ref 3.5–5.1)
Sodium: 143 mmol/L (ref 135–145)

## 2018-02-08 LAB — GLUCOSE, CAPILLARY
GLUCOSE-CAPILLARY: 95 mg/dL (ref 70–99)
GLUCOSE-CAPILLARY: 97 mg/dL (ref 70–99)
Glucose-Capillary: 106 mg/dL — ABNORMAL HIGH (ref 70–99)
Glucose-Capillary: 108 mg/dL — ABNORMAL HIGH (ref 70–99)
Glucose-Capillary: 110 mg/dL — ABNORMAL HIGH (ref 70–99)
Glucose-Capillary: 112 mg/dL — ABNORMAL HIGH (ref 70–99)
Glucose-Capillary: 75 mg/dL (ref 70–99)

## 2018-02-08 LAB — PHOSPHORUS: Phosphorus: 3.1 mg/dL (ref 2.5–4.6)

## 2018-02-08 LAB — MAGNESIUM: Magnesium: 1.8 mg/dL (ref 1.7–2.4)

## 2018-02-08 MED ORDER — CHLORHEXIDINE GLUCONATE 0.12 % MT SOLN
OROMUCOSAL | Status: AC
  Administered 2018-02-08: 15 mL via OROMUCOSAL
  Filled 2018-02-08: qty 15

## 2018-02-08 MED ORDER — POTASSIUM CHLORIDE 10 MEQ/50ML IV SOLN
10.0000 meq | INTRAVENOUS | Status: AC
  Administered 2018-02-08 (×5): 10 meq via INTRAVENOUS
  Filled 2018-02-08 (×5): qty 50

## 2018-02-08 NOTE — Progress Notes (Signed)
PULMONARY / CRITICAL CARE MEDICINE   Name: Samantha Atkins MRN: [id number] DOB: [birth year]    ADMISSION DATE:  01/28/2018 CONSULTATION DATE: 01/29/2018     REFERRING MD:  Dr. Venora Maples   CHIEF COMPLAINT:  Seizure   HISTORY OF PRESENT ILLNESS:   67 year old female with PMH of Seizure, Osteoporosis, Depression, GERD, Insomnia  Presents to ED on 6/23 s/p seizure. Per Husband patient was found seizing and unresponsive. When EMS arrived patient was foaming at the mouth and was given 5 mg Versed. On arrival to ED patient continued to have seizures requiring 2 mg Ativan. Head CT negative. Intubated for airway protection. Given 1 mg Keppra. Neurology consulted. PCCM asked to admit.    6/29 The patient remains on the ventilator. Was tried on SBT but became tachypneic this AM. The patient is no longer seizing. Has just come off pressors.  7/1 She continues to be intubated mechanically ventilated.  She is off of pressors.  She is sedated on a low-dose of Precedex and appropriately nodding to questions for me.  She has had no overt seizure activity.  7/2 she continues to be intubated and mechanically ventilated.  She is on no pressors.  Her Precedex was held prior to my examination and she was nodding appropriately to questions.  She was tolerating a pressure support of 9.  7/3 some hypotension during sleep last night for which she was given a bolus of fluid.  She is very alert and interactive this morning and not complaining of dyspnea after I switched her to CPAP with pressure support of 10.  No further seizure activity. 7/4 is very alert and appropriately nodding to questions this morning complaining of no dyspnea again on a pressure support of 10.  Pertinent to the etiology of her pleural effusions and ascites she denies any history of alcohol or known liver disease.   VITAL SIGNS: BP 104/64   Pulse 88   Temp 99.3 F (37.4 C) (Axillary)   Resp 20   Ht 5\' 2"  (1.575 m)   Wt 88 lb 2.9 oz (40 kg)    SpO2 100%   BMI 16.13 kg/m     CVP:  [4 mmHg-13 mmHg] 6 mmHgVENTILATOR SETTINGS: Vent Mode: PSV;CPAP FiO2 (%):  [30 %] 30 % Set Rate:  [20 bmp] 20 bmp Vt Set:  [400 mL] 400 mL PEEP:  [5 cmH20] 5 cmH20 Pressure Support:  [10 cmH20] 10 cmH20 Plateau Pressure:  [12 cmH20-15 cmH20] 12 cmH20  INTAKE / OUTPUT: I/O last 3 completed shifts: In: 3676.2 [I.V.:1340.7; NG/GT:1160; IV Piggyback:1175.5] Out: 8205 [Urine:8205]  PHYSICAL EXAMINATION: General: Very frail appearing elderly female who is orally intubated and mechanically ventilated.  She is very alert.     Neuro: Nodding appropriately to questions and following simple instructions.    CV: S1 and S2 are regular without murmur rub or gallop.  Trace anasarca is resolved.    PULM: Respirations are unlabored, there is symmetric air movement, no wheezes, and very few scattered rhonchi.       GI: The abdomen is flat and soft without any organomegaly masses tenderness guarding or rebound.  She is anicteric.  There is an old well-healed infraumbilical scar.  There is a line in the right groin with an opaque dressing.        Extremities: No edema   LABS:  BMET Recent Labs  Lab 02/05/18 0452 02/06/18 1221 02/07/18 0343 02/08/18 0541  NA 139  --  141 143  K 3.2*  4.2 3.3* 2.9*  CL 102  --  104 109  CO2 28  --  29 27  BUN 6*  --  9 11  CREATININE 0.35*  --  0.42* 0.39*  GLUCOSE 146*  --  130* 125*    Electrolytes Recent Labs  Lab 02/03/18 0512  02/05/18 0452 02/06/18 1221 02/07/18 0343 02/08/18 0541  CALCIUM 7.7*   < > 7.8*  --  8.2* 7.7*  MG 1.7  --   --  2.0 1.7  --   PHOS 2.8  --   --   --   --   --    < > = values in this interval not displayed.    CBC Recent Labs  Lab 02/04/18 1206 02/07/18 0343 02/08/18 0541  WBC 5.6 3.9* 3.1*  HGB 11.4* 9.0* 7.9*  HCT 35.0* 28.3* 24.8*  PLT 236 193 180    Coag's No results for input(s): APTT, INR in the last 168 hours.  Sepsis Markers No results for input(s):  LATICACIDVEN, PROCALCITON, O2SATVEN in the last 168 hours.  ABG Recent Labs  Lab 02/03/18 1518 02/07/18 0357  PHART 7.557* 7.523*  PCO2ART 27.8* 35.5  PO2ART 74.0* 90.4    Liver Enzymes Recent Labs  Lab 02/06/18 0500 02/07/18 0343  ALBUMIN 1.9* 3.1*    Cardiac Enzymes Recent Labs  Lab 02/06/18 2204 02/07/18 0343 02/07/18 0940  TROPONINI 0.06* 0.06* 0.07*    Glucose Recent Labs  Lab 02/07/18 1210 02/07/18 1542 02/07/18 2020 02/08/18 0007 02/08/18 0453 02/08/18 0740  GLUCAP 117* 122* 109* 106* 75 108*    Imaging No results found.   STUDIES:  CXR 6/24 > Normal sized heart. Tortuous and calcified thoracic aorta. Endotracheal tube tip 3.6 cm above the carina. Nasogastric tube extending into the stomach. Bilateral nipple shadows. Clear lungs. Proximal left humerus fixation hardware. CT Head 6/24 > No acute abnormality. 2. Stable diffuse cerebral and cerebellar atrophy. 3. Stable chronic small vessel white matter ischemic changes in both cerebral hemispheres. LT EEG 6/25>>> This is day 2 of intensive EEG monitoring with simultaneous video monitoring is abnormal for several reasons #1 electrographic seizures arising from right anterior frontal and paracentral cortex with subsequent resolution.  Last seizure around 12 noon.  #2 continuous right hemispheric background activity slowing with superimposed right anterior frontal and anterior paracentral spike still present suggestive of neuronal dysfunction and significant cortical irritability in right anterior and paracentral cortex.    CULTURES: Blood 6/24 >> Urine 6/24 >>neg  LP 6/25>>> Sputum 6/25>>>normal flora   ANTIBIOTICS: unasyn 6/25>>>  SIGNIFICANT EVENTS: 6/23 > Presents to ED  6/25 status epilepticus requiring burst suppression   LINES/TUBES: ETT 6/24 >> R fem CVL 6/24>>>  DISCUSSION: 67yo female with hx seizure disorder admitted with AMS requiring intubation for administration of  anticonvulsants.   Developed ongoing status 6/25, she was heavily sedated to burst suppression.    ASSESSMENT / PLAN:  Respiratory Insufficieny s/p Seizure  ?aspiration even 6/25 P:   CT scan of the chest showed moderate to large bilateral pleural effusions with compressive atelectasis.  As previously noted I am more concerned by the etiology of the effusions that I am by their size.  She does not have LV systolic dysfunction.  She does have concurrent ascites on her CT scan.  Symptomatically, I am continuing to diurese the patient and anticipate that we will be able to separate her from mechanical ventilation today.  I have requested an abdominal ultrasound in part to identify the etiology  of the ascites and in part to determine whether or not there is an adequate collection for me to sample to ensure that we do not have a malignant provocation.  Recent Labs    02/07/18 0343 02/08/18 0541  HGB 9.0* 7.9*    P:  Trend CBC Pneumatic Air stockings  Leukocytosis  suspected secondary to seizure ??aspiration PNA  Fever  P:   Sputum from 6/25 continues to show normal respiratory flora.  CT scan of the chest 7/1 does not show an overt infiltrate there is some bilateral compressive atelectasis.  She is off antibiotics   Hyperglycemia    CBG (last 3)  Recent Labs    02/08/18 0007 02/08/18 0453 02/08/18 0740  GLUCAP 106* 75 108*    P:   SSI     Encephalopathy in setting of Status Epilepticus with breakthrough status  H/O Multiple Falls, Osteo, Seizure on Previous admission 02/2017, Depression  Status epilepticus     She is currently seizure-free and interactive on a combination of 5 agents with antiepileptic activity including Keppra, Ativan, Lyrica, phenobarbital, and Dilantin.  Phenobarbital level is slightly low but I have not adjusted this agent and she is currently controlled.    Lars Masson, MD Critical Care Medicine

## 2018-02-08 NOTE — Progress Notes (Signed)
PS decreased to 6 by MD.

## 2018-02-08 NOTE — Plan of Care (Signed)
Pt tachypneic during sbt, RR in 22s

## 2018-02-08 NOTE — Progress Notes (Addendum)
Brief History 67 year old female admitted 01/28/2018 (status epilepticus) breakthrough seizure activity having been diagnosed with a seizure disorder approximately 1 year ago which was treated with Keppra 500 mg twice daily.    Subjective No family member present.  The patient is currently intubated but alert and able to follow all commands. The patient's nurse reports that there has been no further seizure activity and the patient may possibly be extubated later today.  Her blood pressure tends to run mildly low but this appears to be normal for the patient.  She also received IV Lasix for her pleural effusions.    Past Medical History Past Medical History:  Diagnosis Date  . Anxiety   . Arthritis    hands  . Depression   . Fracture of humerus, proximal, left, closed 08/07/2015  . Fracture, humerus closed 07-31-15 fell   left  . GERD (gastroesophageal reflux disease)   . Headache   . Insomnia   . Migraines   . Osteoporosis     Past Surgical History Past Surgical History:  Procedure Laterality Date  . ABDOMINAL HYSTERECTOMY    . FEMUR FRACTURE SURGERY Left    fell, redo hip replacement  . JOINT REPLACEMENT Bilateral    hip  . ORIF DISTAL RADIUS FRACTURE Left 03-2015  . ORIF HUMERUS FRACTURE Left 08/07/2015   Procedure: LEFT OPEN REDUCTION INTERNAL FIXATION (ORIF) PROXIMAL HUMERUS FRACTURE;  Surgeon: Marchia Bond, MD;  Location: [facility];  Service: Orthopedics;  Laterality: Left;  . TONSILLECTOMY    . TOTAL HIP REVISION Left 02/14/2017   Procedure: LEFT ORIF PERIPROSTHETIC FEMORAL FRACTURE AND REVISION HIP ARTHROPLASTY;  Surgeon: Paralee Cancel, MD;  Location: [facility];  Service: Orthopedics;  Laterality: Left;    Allergies Allergies  Allergen Reactions  . Dust Mite Extract Cough    Home Medications Medications Prior to Admission  Medication Sig Dispense Refill  . butalbital-acetaminophen-caffeine (FIORICET, ESGIC) 50-325-40 MG tablet Take 1 tablet by  mouth every 6 (six) hours as needed for migraine.     . calcium citrate-vitamin D (CITRACAL+D) 315-200 MG-UNIT tablet Take 1 tablet by mouth 2 (two) times daily.    . cholecalciferol (VITAMIN D) 1000 units tablet Take 1,000 Units by mouth daily.    Marland Kitchen escitalopram (LEXAPRO) 10 MG tablet Take 10 mg by mouth daily.    . feeding supplement (BOOST HIGH PROTEIN) LIQD Take 1 Container by mouth daily.    . ferrous sulfate 325 (65 FE) MG EC tablet Take 1 tablet (325 mg total) by mouth every other day. (Patient taking differently: Take 325 mg by mouth daily with breakfast. ) 60 tablet 0  . HYDROcodone-acetaminophen (NORCO) 5-325 MG tablet Take 1-2 tablets by mouth every 6 (six) hours as needed. (Patient taking differently: Take 1-2 tablets by mouth every 6 (six) hours as needed for moderate pain. ) 20 tablet 0  . levETIRAcetam (KEPPRA) 500 MG tablet Take 1 tablet (500 mg total) by mouth 2 (two) times daily. 60 tablet 0  . methocarbamol (ROBAXIN) 500 MG tablet Take 1 tablet (500 mg total) by mouth every 6 (six) hours as needed for muscle spasms. 30 tablet 0  . Multiple Vitamin (MULTIVITAMIN WITH MINERALS) TABS tablet Take 1 tablet by mouth daily.    Marland Kitchen omeprazole (PRILOSEC) 20 MG capsule Take 20 mg by mouth daily.    . potassium chloride SA (K-DUR,KLOR-CON) 20 MEQ tablet Take 20 mEq by mouth daily.    . psyllium (HYDROCIL/METAMUCIL) 95 % PACK Take 1 packet by mouth  daily.    . senna-docusate (SENOKOT-S) 8.6-50 MG tablet Take 1 tablet by mouth at bedtime. 30 tablet 0  . sertraline (ZOLOFT) 100 MG tablet Take 100 mg by mouth daily.    Marland Kitchen zinc gluconate 50 MG tablet Take 50 mg by mouth daily.      Hospital Medications . aspirin  81 mg Per Tube Daily  . chlorhexidine gluconate (MEDLINE KIT)  15 mL Mouth Rinse BID  . Chlorhexidine Gluconate Cloth  6 each Topical Daily  . feeding supplement (PRO-STAT SUGAR FREE 64)  30 mL Per Tube BID  . furosemide  20 mg Intravenous Q12H  . heparin injection (subcutaneous)   5,000 Units Subcutaneous Q8H  . insulin aspart  0-15 Units Subcutaneous Q4H  . LORazepam  1 mg Intravenous Q8H  . mouth rinse  15 mL Mouth Rinse 10 times per day  . pantoprazole sodium  40 mg Per Tube Daily  . PHENObarbital  60 mg Intravenous BID  . potassium chloride  20 mEq Per Tube BID  . pregabalin  150 mg Per Tube Q8H  . sodium chloride flush  10-40 mL Intracatheter Q12H  . thiamine injection  100 mg Intravenous Daily     Objective  Intake/Output from previous day: 07/03 0701 - 07/04 0700 In: 1924 [I.V.:210.3; NG/GT:920; IV Piggyback:793.7] Out: 0315 [Urine:5455] Intake/Output this shift: Total I/O In: 101.3 [I.V.:1.3; IV Piggyback:100] Out: 575 [Urine:575] Nutritional status:  Diet Order    None       Physical Exam  Vitals:   02/08/18 0821 02/08/18 0824 02/08/18 0900 02/08/18 0931  BP: 104/64  100/63   Pulse: 88  97   Resp: 20  20   Temp:      TempSrc:      SpO2: 100% 100% 100% 100%  Weight:      Height:       General -pleasant, alert, 67 year old female intubated but in no acute distress Heart - Regular rate and rhythm - no murmer appreciated Lungs - Clear to auscultation anteriorly Abdomen - Soft - non tender Extremities - Distal pulses intact - no edema Skin - Warm and dry  Neurologic Exam:   Mental Status:  Alert, oriented per nursing. Able to follow 3 step commands without difficulty.  Cranial Nerves:  II-bilateral visual fields intact III/IV/VI-Pupils were equal and reactive. Extraocular movements were full.  Horizontal nystagmus noted. V/VII-no facial numbness and no facial weakness.  VIII-hearing normal.  X-normal speech and symmetrical palatal movement.  XII-midline tongue extension  Motor: Right : Upper extremity   5/5  Left:     Upper extremity   5/5  Lower extremity   5/5                         Lower extremity   5/5 Tone and bulk:normal tone throughout; no atrophy noted Sensory: Intact to light touch in all extremities. Deep Tendon  Reflexes: 2/4 throughout Plantars: Downgoing bilaterally  Cerebellar: Normal finger to nose. Gait: not tested    LABORATORY RESULTS:  Basic Metabolic Panel: Recent Labs  Lab 02/03/18 0512 02/04/18 1206 02/05/18 0452 02/06/18 1221 02/07/18 0343 02/08/18 0541  NA 139 139 139  --  141 143  K 3.3* 3.4* 3.2* 4.2 3.3* 2.9*  CL 108 101 102  --  104 109  CO2 25 29 28   --  29 27  GLUCOSE 144* 138* 146*  --  130* 125*  BUN <5* 8 6*  --  9 11  CREATININE 0.39* 0.45 0.35*  --  0.42* 0.39*  CALCIUM 7.7* 7.9* 7.8*  --  8.2* 7.7*  MG 1.7  --   --  2.0 1.7  --   PHOS 2.8  --   --   --   --   --     Liver Function Tests: Recent Labs  Lab 02/06/18 0500 02/07/18 0343  ALBUMIN 1.9* 3.1*   No results for input(s): LIPASE, AMYLASE in the last 168 hours. No results for input(s): AMMONIA in the last 168 hours.  CBC: Recent Labs  Lab 02/04/18 1206 02/07/18 0343 02/08/18 0541  WBC 5.6 3.9* 3.1*  NEUTROABS 4.1 2.3 1.9  HGB 11.4* 9.0* 7.9*  HCT 35.0* 28.3* 24.8*  MCV 102.6* 105.2* 107.4*  PLT 236 193 180    Cardiac Enzymes: Recent Labs  Lab 02/06/18 2204 02/07/18 0343 02/07/18 0940  TROPONINI 0.06* 0.06* 0.07*    Lipid Panel: No results for input(s): CHOL, TRIG, HDL, CHOLHDL, VLDL, LDLCALC in the last 168 hours.  CBG: Recent Labs  Lab 02/07/18 1542 02/07/18 2020 02/08/18 0007 02/08/18 0453 02/08/18 0740  GLUCAP 122* 109* 106* 75 108*    Microbiology:   Coagulation Studies: No results for input(s): LABPROT, INR in the last 72 hours.   Miscellaneous Labs: Dilantin uncorrected level from yesterday is 13.6 Albumin from yesterday (7/4) was 3.1 New corrected Dilantin level is 14.3 Phenobarbital (7/3): 16.4    IMAGING RESULTS  Ct Head Wo Contrast 01/29/2018 IMPRESSION:  1. No acute abnormality.  2. Stable diffuse cerebral and cerebellar atrophy.  3. Stable chronic small vessel white matter ischemic changes in both cerebral hemispheres.    Ct Chest Wo  Contrast 02/05/2018 IMPRESSION:  1. Large bilateral pleural effusions, slightly greater on the right than the left.  2. Complete compressive atelectasis of the right lower lobe. Partial compressive atelectasis of the left lower lobe.  3. No infiltrates.  Pulmonary vascularity and heart size are normal.  4. Extensive ascites.  5. Anasarca.  6. Multiple compression fractures, most likely all old.    Mr Brain Wo Contrast 01/30/2018 IMPRESSION:  No acute or reversible finding. Similar appearance to the study of last year. Moderate brain atrophy. Scattered foci of T2 and FLAIR signal within the white matter most consistent with small vessel disease. No specific finding to explain seizures.    [place] Chest (pleural Effusion) 02/04/2018 IMPRESSION:  Small right pleural effusion.  Thoracentesis deferred.    Transthoracic Echocardiogram 02/05/2018 Study Conclusions - Left ventricle: The cavity size was normal. Wall thickness was   normal. Systolic function was normal. The estimated ejection   fraction was in the range of 50% to 55%. Wall motion was normal;   there were no regional wall motion abnormalities. Doppler   parameters are consistent with abnormal left ventricular   relaxation (grade 1 diastolic dysfunction). - Line: A venous catheter was visualized in the right atrium. - Pericardium, extracardiac: There was a left pleural effusion. Impressions - Normal LV systollic function; mild diastolic dysfunction.    Assessment/Plan:  67 year old female admitted 01/28/2018 with breakthrough seizure activity having been diagnosed with a seizure disorder approximately 1 year ago which was treated with Keppra 500 mg twice daily.    Currently on 5 antiepileptic medications: -Keppra 1500 mg every 12 hours -Lorazepam 1 mg every 8 hours -Phenobarbital 60 mg twice daily -Pregabalin 150 mg q8h -Still with slight nystagmus on horizontal endgaze with today's examination. However, now that corrected  level is therapeutic, to prevent undershooting resulting  in a subtherapeutic level, Dilantin is being continued, albeit at a lower total daily dose, which has been lowered to 150 mg every 12 hours. Repeat level ordered for tomorrow morning.    Mikey Bussing PA-C [facility] Pager [phone] 02/08/2018, 10:10 AM   Electronically signed: Dr. Kerney Elbe

## 2018-02-09 ENCOUNTER — Inpatient Hospital Stay (HOSPITAL_COMMUNITY): Payer: Commercial Managed Care - PPO

## 2018-02-09 LAB — CBC WITH DIFFERENTIAL/PLATELET
Abs Immature Granulocytes: 0 10*3/uL (ref 0.0–0.1)
Basophils Absolute: 0 10*3/uL (ref 0.0–0.1)
Basophils Relative: 1 %
Eosinophils Absolute: 0.1 10*3/uL (ref 0.0–0.7)
Eosinophils Relative: 3 %
HEMATOCRIT: 29.2 % — AB (ref 36.0–46.0)
HEMOGLOBIN: 9.3 g/dL — AB (ref 12.0–15.0)
IMMATURE GRANULOCYTES: 0 %
LYMPHS ABS: 0.8 10*3/uL (ref 0.7–4.0)
LYMPHS PCT: 20 %
MCH: 33.6 pg (ref 26.0–34.0)
MCHC: 31.8 g/dL (ref 30.0–36.0)
MCV: 105.4 fL — ABNORMAL HIGH (ref 78.0–100.0)
MONOS PCT: 9 %
Monocytes Absolute: 0.4 10*3/uL (ref 0.1–1.0)
Neutro Abs: 2.9 10*3/uL (ref 1.7–7.7)
Neutrophils Relative %: 67 %
Platelets: 246 10*3/uL (ref 150–400)
RBC: 2.77 MIL/uL — AB (ref 3.87–5.11)
RDW: 12.5 % (ref 11.5–15.5)
WBC: 4.3 10*3/uL (ref 4.0–10.5)

## 2018-02-09 LAB — GLUCOSE, CAPILLARY
GLUCOSE-CAPILLARY: 74 mg/dL (ref 70–99)
GLUCOSE-CAPILLARY: 85 mg/dL (ref 70–99)
Glucose-Capillary: 97 mg/dL (ref 70–99)
Glucose-Capillary: 114 mg/dL — ABNORMAL HIGH (ref 70–99)
Glucose-Capillary: 84 mg/dL (ref 70–99)
Glucose-Capillary: 77 mg/dL (ref 70–99)

## 2018-02-09 LAB — BLOOD GAS, ARTERIAL
ACID-BASE EXCESS: 6.8 mmol/L — AB (ref 0.0–2.0)
BICARBONATE: 29.5 mmol/L — AB (ref 20.0–28.0)
Drawn by: 347621
FIO2: 30
LHR: 20 {breaths}/min
MECHVT: 400 mL
O2 Saturation: 99.5 %
PATIENT TEMPERATURE: 97.6
PEEP/CPAP: 5 cmH2O
PH ART: 7.573 — AB (ref 7.350–7.450)
PO2 ART: 144 mmHg — AB (ref 83.0–108.0)
pCO2 arterial: 31.8 mmHg — ABNORMAL LOW (ref 32.0–48.0)

## 2018-02-09 LAB — COMPREHENSIVE METABOLIC PANEL
ALBUMIN: 4.9 g/dL (ref 3.5–5.0)
ALT: 25 U/L (ref 0–44)
AST: 20 U/L (ref 15–41)
Alkaline Phosphatase: 64 U/L (ref 38–126)
Anion gap: 9 (ref 5–15)
BILIRUBIN TOTAL: 0.6 mg/dL (ref 0.3–1.2)
BUN: 20 mg/dL (ref 8–23)
CHLORIDE: 100 mmol/L (ref 98–111)
CO2: 31 mmol/L (ref 22–32)
Calcium: 10 mg/dL (ref 8.9–10.3)
Creatinine, Ser: 0.56 mg/dL (ref 0.44–1.00)
GFR calc Af Amer: >60 mL/min (ref >60)
GFR calc non Af Amer: >60 mL/min (ref >60)
GLUCOSE: 94 mg/dL (ref 70–99)
POTASSIUM: 4 mmol/L (ref 3.5–5.1)
Sodium: 140 mmol/L (ref 135–145)
Total Protein: 6.6 g/dL (ref 6.5–8.1)

## 2018-02-09 LAB — PHENOBARBITAL LEVEL: Phenobarbital: 20.6 ug/mL (ref 15.0–30.0)

## 2018-02-09 LAB — MAGNESIUM: Magnesium: 2.2 mg/dL (ref 1.7–2.4)

## 2018-02-09 MED ORDER — FUROSEMIDE 10 MG/ML IJ SOLN
20.0000 mg | Freq: Every day | INTRAMUSCULAR | Status: DC
  Administered 2018-02-09 – 2018-02-10 (×2): 20 mg via INTRAVENOUS
  Filled 2018-02-09 (×2): qty 2

## 2018-02-09 MED ORDER — POTASSIUM CHLORIDE 20 MEQ PO PACK
20.0000 meq | PACK | Freq: Every day | ORAL | Status: DC
  Administered 2018-02-09 – 2018-02-10 (×2): 20 meq
  Filled 2018-02-09 (×3): qty 1

## 2018-02-09 MED ORDER — LORAZEPAM 2 MG/ML IJ SOLN
1.0000 mg | Freq: Once | INTRAMUSCULAR | Status: AC
  Administered 2018-02-09: 1 mg via INTRAVENOUS
  Filled 2018-02-09: qty 1

## 2018-02-09 MED ORDER — KETOROLAC TROMETHAMINE 15 MG/ML IJ SOLN
15.0000 mg | Freq: Four times a day (QID) | INTRAMUSCULAR | Status: DC | PRN
  Administered 2018-02-09 – 2018-02-11 (×5): 15 mg via INTRAVENOUS
  Filled 2018-02-09 (×5): qty 1

## 2018-02-09 NOTE — Progress Notes (Signed)
PULMONARY / CRITICAL CARE MEDICINE   Name: Samantha Atkins MRN: [id number] DOB: [birth year]    ADMISSION DATE:  01/28/2018 CONSULTATION DATE: 01/29/2018     REFERRING MD:  Dr. Venora Maples   CHIEF COMPLAINT:  Seizure   HISTORY OF PRESENT ILLNESS:   67 year old female with PMH of Seizure, Osteoporosis, Depression, GERD, Insomnia  Presents to ED on 6/23 s/p seizure. Per Husband patient was found seizing and unresponsive. When EMS arrived patient was foaming at the mouth and was given 5 mg Versed. On arrival to ED patient continued to have seizures requiring 2 mg Ativan. Head CT negative. Intubated for airway protection. Given 1 mg Keppra. Neurology consulted. PCCM asked to admit.    6/29 The patient remains on the ventilator. Was tried on SBT but became tachypneic this AM. The patient is no longer seizing. Has just come off pressors.  7/1 She continues to be intubated mechanically ventilated.  She is off of pressors.  She is sedated on a low-dose of Precedex and appropriately nodding to questions for me.  She has had no overt seizure activity.  7/2 she continues to be intubated and mechanically ventilated.  She is on no pressors.  Her Precedex was held prior to my examination and she was nodding appropriately to questions.  She was tolerating a pressure support of 9.  7/3 some hypotension during sleep last night for which she was given a bolus of fluid.  She is very alert and interactive this morning and not complaining of dyspnea after I switched her to CPAP with pressure support of 10.  No further seizure activity. 7/4 is very alert and appropriately nodding to questions this morning complaining of no dyspnea again on a pressure support of 10.  Pertinent to the etiology of her pleural effusions and ascites she denies any history of alcohol or known liver disease.  7/5 she is again very alert and appropriately interactive.  Her Dilantin has been discontinued.  There is been no further seizure  activity.  She is not complaining of any dyspnea after I switched her to a pressure support of 7 during my visit.   VITAL SIGNS: BP (!) 98/59   Pulse 87   Temp 97.6 F (36.4 C)   Resp (!) 24   Ht 5\' 2"  (1.575 m)   Wt 86 lb 6.7 oz (39.2 kg)   SpO2 100%   BMI 15.81 kg/m     CVP:  [2 mmHg-6 mmHg] 6 mmHgVENTILATOR SETTINGS: Vent Mode: PSV;CPAP FiO2 (%):  [30 %] 30 % Set Rate:  [20 bmp] 20 bmp Vt Set:  [400 mL] 400 mL PEEP:  [5 cmH20] 5 cmH20 Pressure Support:  [6 cmH20-10 cmH20] 10 cmH20 Plateau Pressure:  [13 cmH20-26 cmH20] 13 cmH20  INTAKE / OUTPUT: I/O last 3 completed shifts: In: 2539.1 [I.V.:99.9; NG/GT:1061.3; IV Piggyback:1377.9] Out: 7325 [Urine:7325]  PHYSICAL EXAMINATION: General: Thin and very frail-appearing elderly female who is orally intubated and mechanically ventilated.  She is very alert using electronics and writing notes      Neuro: Nodding appropriately to questions and following simple instructions.  Moving all fours.  Pupils are equal.      CV: S1 and S2 are regular without murmur rub or gallop.  There is no dependent edema.      PULM: Respirations are unlabored on a pressure support of 7, there is symmetric air movement, no wheezes, very few scattered rhonchi .       GI: Abdomen is flat and  soft without any organomegaly masses tenderness guarding or rebound.          Extremities: No edema   LABS:  BMET Recent Labs  Lab 02/07/18 0343 02/08/18 0541 02/09/18 0400  NA 141 143 140  K 3.3* 2.9* 4.0  CL 104 109 100  CO2 29 27 31   BUN 9 11 20   CREATININE 0.42* 0.39* 0.56  GLUCOSE 130* 125* 94    Electrolytes Recent Labs  Lab 02/03/18 0512  02/07/18 0343 02/08/18 0541 02/09/18 0400  CALCIUM 7.7*   < > 8.2* 7.7* 10.0  MG 1.7   < > 1.7 1.8 2.2  PHOS 2.8  --   --  3.1  --    < > = values in this interval not displayed.    CBC Recent Labs  Lab 02/07/18 0343 02/08/18 0541 02/09/18 0400  WBC 3.9* 3.1* 4.3  HGB 9.0* 7.9* 9.3*  HCT 28.3*  24.8* 29.2*  PLT 193 180 246    Coag's No results for input(s): APTT, INR in the last 168 hours.  Sepsis Markers No results for input(s): LATICACIDVEN, PROCALCITON, O2SATVEN in the last 168 hours.  ABG Recent Labs  Lab 02/03/18 1518 02/07/18 0357 02/09/18 0440  PHART 7.557* 7.523* 7.573*  PCO2ART 27.8* 35.5 31.8*  PO2ART 74.0* 90.4 144*    Liver Enzymes Recent Labs  Lab 02/06/18 0500 02/07/18 0343 02/09/18 0400  AST  --   --  20  ALT  --   --  25  ALKPHOS  --   --  64  BILITOT  --   --  0.6  ALBUMIN 1.9* 3.1* 4.9    Cardiac Enzymes Recent Labs  Lab 02/06/18 2204 02/07/18 0343 02/07/18 0940  TROPONINI 0.06* 0.06* 0.07*    Glucose Recent Labs  Lab 02/08/18 1126 02/08/18 1522 02/08/18 1940 02/08/18 2327 02/09/18 0342 02/09/18 0811  GLUCAP 95 110* 97 112* 97 114*    Imaging Dg Chest Port 1 View  Result Date: 02/08/2018 CLINICAL DATA:  Intubation EXAM: PORTABLE CHEST 1 VIEW COMPARISON:  02/03/2018 FINDINGS: Interval placement of right PICC line. The tip is in the upper right atrium approximately 3 cm deep to the cavoatrial junction. NG tube is in the stomach, unchanged. Stable position of the endotracheal tube. Low lung volumes. Bilateral pleural effusions and bibasilar opacities, likely atelectasis, somewhat improving since prior study. Heart is normal size. IMPRESSION: Low lung volumes with bilateral effusions and bibasilar opacities, likely atelectasis. These have improved slightly since prior study. Right PICC line tip in the upper right atrium approximately 3 cm below the cavoatrial junction. Electronically Signed   By: Rolm Baptise M.D.   On: 02/08/2018 09:50   US Abdomen Limited Ruq  Result Date: 02/09/2018 CLINICAL DATA:  Ascites. EXAM: ULTRASOUND ABDOMEN LIMITED RIGHT UPPER QUADRANT COMPARISON:  MRI 04/07/2017.  Ultrasound 02/16/2017. FINDINGS: Gallbladder: No gallstones or wall thickening visualized. No sonographic Murphy sign noted by sonographer.  Common bile duct: Diameter: 4.2 mm Liver: 1.5 cm hyperechoic noted the right lobe liver consistent with hemangioma previously identified on prior MRI of 04/07/2017. No significant hepatic mass lesion identified. Portal vein is patent on color Doppler imaging with normal direction of blood flow towards the liver. Trace ascites. IMPRESSION: 1.  No gallstones or biliary distention. 2. 1.5 cm hyperechoic nodule noted the right lobe liver consistent with hemangioma previously identified on prior MRI of 04/07/2017. No significant hepatic mass identified. 3.  Trace ascites. Electronically Signed   By: Marcello Moores  Register   On:  02/09/2018 06:31     STUDIES:  CXR 6/24 > Normal sized heart. Tortuous and calcified thoracic aorta. Endotracheal tube tip 3.6 cm above the carina. Nasogastric tube extending into the stomach. Bilateral nipple shadows. Clear lungs. Proximal left humerus fixation hardware. CT Head 6/24 > No acute abnormality. 2. Stable diffuse cerebral and cerebellar atrophy. 3. Stable chronic small vessel white matter ischemic changes in both cerebral hemispheres. LT EEG 6/25>>> This is day 2 of intensive EEG monitoring with simultaneous video monitoring is abnormal for several reasons #1 electrographic seizures arising from right anterior frontal and paracentral cortex with subsequent resolution.  Last seizure around 12 noon.  #2 continuous right hemispheric background activity slowing with superimposed right anterior frontal and anterior paracentral spike still present suggestive of neuronal dysfunction and significant cortical irritability in right anterior and paracentral cortex.    CULTURES: Blood 6/24 >> Urine 6/24 >>neg  LP 6/25>>> Sputum 6/25>>>normal flora   ANTIBIOTICS: unasyn 6/25>>>  SIGNIFICANT EVENTS: 6/23 > Presents to ED  6/25 status epilepticus requiring burst suppression   LINES/TUBES: ETT 6/24 >> R fem CVL 6/24>>>  DISCUSSION: 67yo female with hx seizure disorder  admitted with AMS requiring intubation for administration of anticonvulsants.   Developed ongoing status 6/25, she was heavily sedated to burst suppression.    ASSESSMENT / PLAN:  Respiratory Insufficieny s/p Seizure  ?aspiration even 6/25 P:   CT scan of the chest showed moderate to large bilateral pleural effusions with compressive atelectasis.  As previously noted I am more concerned by the etiology of the effusions that I am by their size.  She does not have LV systolic dysfunction.  She does have concurrent ascites on her CT scan, there was only trace ascites on an ultrasound of 7/4 not amenable to tapping.  Her respiratory mechanics and gas exchange are excellent this morning and I have switched her to a pressure support of 7 anticipating extubation in the near future if this is tolerated  Recent Labs    02/08/18 0541 02/09/18 0400  HGB 7.9* 9.3*    P:  Trend CBC Pneumatic Air stockings  Leukocytosis  suspected secondary to seizure ??aspiration PNA  Fever  P:   Sputum from 6/25 continues to show normal respiratory flora.  CT scan of the chest 7/1 does not show an overt infiltrate there is some bilateral compressive atelectasis.  She is off antibiotics   Hyperglycemia    CBG (last 3)  Recent Labs    02/08/18 2327 02/09/18 0342 02/09/18 0811  GLUCAP 112* 97 114*    P:   SSI     Encephalopathy in setting of Status Epilepticus with breakthrough status  H/O Multiple Falls, Osteo, Seizure on Previous admission 02/2017, Depression  Status epilepticus     She is currently seizure-free and interactive on a combination of 4 agents with antiepileptic activity including Keppra, Ativan, Lyrica, and phenobarbital.  Dilantin has been discontinued.    Lars Masson, MD Critical Care Medicine

## 2018-02-09 NOTE — Procedures (Signed)
Extubation Procedure Note  Patient Details:   Name: Samantha Atkins DOB: 1951-03-29 MRN: [id number]   Airway Documentation:  Airway 7.5 mm (Active)  Secured at (cm) 21 cm 02/09/2018  8:03 AM  Measured From Lips 02/09/2018  8:03 AM  [place] 02/09/2018  8:03 AM  Secured By [employer] 02/09/2018  8:03 AM  Tube Holder Repositioned Yes 02/09/2018  8:03 AM  Cuff Pressure (cm H2O) 28 cm H2O 02/09/2018  8:03 AM  Site Condition Dry 02/09/2018  8:03 AM   Vent end date: (not recorded) Vent end time: (not recorded)   Evaluation  O2 sats: stable throughout Complications: No apparent complications Patient did tolerate procedure well. Bilateral Breath Sounds: Rhonchi, Other (Comment)(coarse)   Yes   Patient extubated per order to 2L [place] with no complications. MD was notified that there was very little cuff leak but ordered RT to proceed with extubation. Patient is alert and oriented to place and is able to speak. Vitals are stable. RT will continue to monitor.   Clydean Posas Clyda Greener 02/09/2018, 10:17 AM

## 2018-02-09 NOTE — Progress Notes (Signed)
Subjective: Awake and alert. Bright affect. Still intubated.   Brief History: 67 year old female admitted 01/28/2018 (status epilepticus) breakthrough seizure activity having been diagnosed with a seizure disorder approximately 1 year ago which was treated with Keppra 500 mg twice daily.  Objective: Current vital signs: BP 94/65   Pulse 73   Temp 97.6 F (36.4 C)   Resp 20   Ht 5' 2"  (1.575 m)   Wt 39.2 kg (86 lb 6.7 oz)   SpO2 100%   BMI 15.81 kg/m  Vital signs in last 24 hours: Temp:  [97.6 F (36.4 C)-99.6 F (37.6 C)] 97.6 F (36.4 C) (07/05 0315) Pulse Rate:  [73-97] 73 (07/05 0700) Resp:  [17-37] 20 (07/05 0700) BP: (91-111)/(51-84) 94/65 (07/05 0700) SpO2:  [100 %] 100 % (07/05 0700) FiO2 (%):  [30 %] 30 % (07/05 0440) Weight:  [39.2 kg (86 lb 6.7 oz)] 39.2 kg (86 lb 6.7 oz) (07/05 0436)  Intake/Output from previous day: 07/04 0701 - 07/05 0700 In: 1554.5 [I.V.:11.3; NG/GT:581.3; IV Piggyback:961.8] Out: 4500 [Urine:4500] Intake/Output this shift: No intake/output data recorded. Nutritional status:  Diet Order    None     HEENT: [place]/AT Lungs: Intubated Ext: Warm and well perfused  Neurologic Exam: Mental Status: Awake and alert. Good eye contact. Pleasant and cooperative in the context of being intubated. Able to name objects by mouthing them. No aphasia based on comprehension of commands and normal grammar/syntax of writing sample.  Cranial Nerves: II:  Fixates and tracks normally.  III,IV, VI: EOM are full, with prominent, nonfatiguable horizontal endgaze nystagmus.  V,VII: No asymmetry noted.  VIII: Hearing intact to voice IX,X: Intubated XI: No asymmetry noted XII: Intubated Motor: Right : Upper extremity   5/5    Left:     Upper extremity   5/5  Lower extremity   5/5     Lower extremity   5/5 Cerebellar: No gross ataxia noted with motor examination.    Lab Results: Results for orders placed or performed during the hospital encounter of 01/28/18  (from the past 48 hour(s))  Glucose, capillary     Status: Abnormal   Collection Time: 02/07/18  8:22 AM  Result Value Ref Range   Glucose-Capillary 110 (H) 70 - 99 mg/dL  Troponin I (q 6hr x 3)     Status: Abnormal   Collection Time: 02/07/18  9:40 AM  Result Value Ref Range   Troponin I 0.07 (HH) <0.03 ng/mL    Comment: CRITICAL VALUE NOTED.  VALUE IS CONSISTENT WITH PREVIOUSLY REPORTED AND CALLED VALUE. Performed at [facility], [place] [address], [place], [place] [address]   Glucose, capillary     Status: Abnormal   Collection Time: 02/07/18 12:10 PM  Result Value Ref Range   Glucose-Capillary 117 (H) 70 - 99 mg/dL   Comment 1 Notify RN    Comment 2 Document in Chart   Glucose, capillary     Status: Abnormal   Collection Time: 02/07/18  3:42 PM  Result Value Ref Range   Glucose-Capillary 122 (H) 70 - 99 mg/dL  Glucose, capillary     Status: Abnormal   Collection Time: 02/07/18  8:20 PM  Result Value Ref Range   Glucose-Capillary 109 (H) 70 - 99 mg/dL  Glucose, capillary     Status: Abnormal   Collection Time: 02/08/18 12:07 AM  Result Value Ref Range   Glucose-Capillary 106 (H) 70 - 99 mg/dL  Glucose, capillary     Status: None  Collection Time: 02/08/18  4:53 AM  Result Value Ref Range   Glucose-Capillary 75 70 - 99 mg/dL  Basic metabolic panel     Status: Abnormal   Collection Time: 02/08/18  5:41 AM  Result Value Ref Range   Sodium 143 135 - 145 mmol/L   Potassium 2.9 (L) 3.5 - 5.1 mmol/L   Chloride 109 98 - 111 mmol/L    Comment: Please note change in reference range.   CO2 27 22 - 32 mmol/L   Glucose, Bld 125 (H) 70 - 99 mg/dL    Comment: Please note change in reference range.   BUN 11 8 - 23 mg/dL    Comment: Please note change in reference range.   Creatinine, Ser 0.39 (L) 0.44 - 1.00 mg/dL   Calcium 7.7 (L) 8.9 - 10.3 mg/dL   GFR calc non Af Amer >60 >60 mL/min   GFR calc Af Amer >60 >60 mL/min    Comment: (NOTE) The eGFR has been calculated using  the CKD EPI equation. This calculation has not been validated in all clinical situations. eGFR's persistently <60 mL/min signify possible Chronic Kidney Disease.    Anion gap 7 5 - 15    Comment: Performed at [facility] [address], [place], [place] [address]  CBC with Differential/Platelet     Status: Abnormal   Collection Time: 02/08/18  5:41 AM  Result Value Ref Range   WBC 3.1 (L) 4.0 - 10.5 K/uL   RBC 2.31 (L) 3.87 - 5.11 MIL/uL   Hemoglobin 7.9 (L) 12.0 - 15.0 g/dL   HCT 24.8 (L) 36.0 - 46.0 %   MCV 107.4 (H) 78.0 - 100.0 fL   MCH 34.2 (H) 26.0 - 34.0 pg   MCHC 31.9 30.0 - 36.0 g/dL   RDW 12.9 11.5 - 15.5 %   Platelets 180 150 - 400 K/uL   Neutrophils Relative % 60 %   Neutro Abs 1.9 1.7 - 7.7 K/uL   Lymphocytes Relative 25 %   Lymphs Abs 0.8 0.7 - 4.0 K/uL   Monocytes Relative 9 %   Monocytes Absolute 0.3 0.1 - 1.0 K/uL   Eosinophils Relative 4 %   Eosinophils Absolute 0.1 0.0 - 0.7 K/uL   Basophils Relative 1 %   Basophils Absolute 0.0 0.0 - 0.1 K/uL   Immature Granulocytes 1 %   Abs Immature Granulocytes 0.0 0.0 - 0.1 K/uL    Comment: Performed at [facility] [address] [place], [place] [address]  Phosphorus     Status: None   Collection Time: 02/08/18  5:41 AM  Result Value Ref Range   Phosphorus 3.1 2.5 - 4.6 mg/dL    Comment: Performed at [facility] [address], [place], [place] [address]  Magnesium     Status: None   Collection Time: 02/08/18  5:41 AM  Result Value Ref Range   Magnesium 1.8 1.7 - 2.4 mg/dL    Comment: Performed at [facility] [address], [place], [place] [address]  Glucose, capillary     Status: Abnormal   Collection Time: 02/08/18  7:40 AM  Result Value Ref Range   Glucose-Capillary 108 (H) 70 - 99 mg/dL  Glucose, capillary     Status: None   Collection Time: 02/08/18 11:26 AM  Result Value Ref Range   Glucose-Capillary 95 70 - 99 mg/dL  Glucose, capillary     Status: Abnormal    Collection Time: 02/08/18  3:22 PM  Result Value Ref Range   Glucose-Capillary 110 (H) 70 - 99 mg/dL  Glucose, capillary     Status: None   Collection Time: 02/08/18  7:40 PM  Result Value Ref Range   Glucose-Capillary 97 70 - 99 mg/dL  Glucose, capillary     Status: Abnormal   Collection Time: 02/08/18 11:27 PM  Result Value Ref Range   Glucose-Capillary 112 (H) 70 - 99 mg/dL  Glucose, capillary     Status: None   Collection Time: 02/09/18  3:42 AM  Result Value Ref Range   Glucose-Capillary 97 70 - 99 mg/dL  Comprehensive metabolic panel     Status: None   Collection Time: 02/09/18  4:00 AM  Result Value Ref Range   Sodium 140 135 - 145 mmol/L   Potassium 4.0 3.5 - 5.1 mmol/L   Chloride 100 98 - 111 mmol/L    Comment: Please note change in reference range.   CO2 31 22 - 32 mmol/L   Glucose, Bld 94 70 - 99 mg/dL    Comment: Please note change in reference range.   BUN 20 8 - 23 mg/dL    Comment: Please note change in reference range.   Creatinine, Ser 0.56 0.44 - 1.00 mg/dL   Calcium 10.0 8.9 - 10.3 mg/dL    Comment: DELTA CHECK NOTED   Total Protein 6.6 6.5 - 8.1 g/dL   Albumin 4.9 3.5 - 5.0 g/dL   AST 20 15 - 41 U/L   ALT 25 0 - 44 U/L    Comment: Please note change in reference range.   Alkaline Phosphatase 64 38 - 126 U/L   Total Bilirubin 0.6 0.3 - 1.2 mg/dL   GFR calc non Af Amer >60 >60 mL/min   GFR calc Af Amer >60 >60 mL/min    Comment: (NOTE) The eGFR has been calculated using the CKD EPI equation. This calculation has not been validated in all clinical situations. eGFR's persistently <60 mL/min signify possible Chronic Kidney Disease.    Anion gap 9 5 - 15    Comment: Performed at [facility] [address] [place], [place] [address]  Magnesium     Status: None   Collection Time: 02/09/18  4:00 AM  Result Value Ref Range   Magnesium 2.2 1.7 - 2.4 mg/dL    Comment: Performed at [facility] [address], [place],  [address]  CBC  with Differential/Platelet     Status: Abnormal   Collection Time: 02/09/18  4:00 AM  Result Value Ref Range   WBC 4.3 4.0 - 10.5 K/uL   RBC 2.77 (L) 3.87 - 5.11 MIL/uL   Hemoglobin 9.3 (L) 12.0 - 15.0 g/dL   HCT 29.2 (L) 36.0 - 46.0 %   MCV 105.4 (H) 78.0 - 100.0 fL   MCH 33.6 26.0 - 34.0 pg   MCHC 31.8 30.0 - 36.0 g/dL   RDW 12.5 11.5 - 15.5 %   Platelets 246 150 - 400 K/uL   Neutrophils Relative % 67 %   Neutro Abs 2.9 1.7 - 7.7 K/uL   Lymphocytes Relative 20 %   Lymphs Abs 0.8 0.7 - 4.0 K/uL   Monocytes Relative 9 %   Monocytes Absolute 0.4 0.1 - 1.0 K/uL   Eosinophils Relative 3 %   Eosinophils Absolute 0.1 0.0 - 0.7 K/uL   Basophils Relative 1 %   Basophils Absolute 0.0 0.0 - 0.1 K/uL   Immature Granulocytes 0 %  Abs Immature Granulocytes 0.0 0.0 - 0.1 K/uL    Comment: Performed at [facility], [place] [address], [place], [place] [address]  Blood gas, arterial     Status: Abnormal   Collection Time: 02/09/18  4:40 AM  Result Value Ref Range   FIO2 30.00    Delivery systems VENTILATOR    Mode PRESSURE REGULATED VOLUME CONTROL    VT 400 mL   LHR 20 resp/min   Peep/cpap 5.0 cm H20   pH, Arterial 7.573 (H) 7.350 - 7.450   pCO2 arterial 31.8 (L) 32.0 - 48.0 mmHg   pO2, Arterial 144 (H) 83.0 - 108.0 mmHg   Bicarbonate 29.5 (H) 20.0 - 28.0 mmol/L   Acid-Base Excess 6.8 (H) 0.0 - 2.0 mmol/L   O2 Saturation 99.5 %   Patient temperature 97.6    Collection site RIGHT RADIAL    Drawn by [id number]    Sample type ARTERIAL DRAW    Allens test (pass/fail) PASS PASS    Recent Results (from the past 240 hour(s))  Culture, respiratory (NON-Expectorated)     Status: None   Collection Time: 01/30/18  8:35 AM  Result Value Ref Range Status   Specimen Description TRACHEAL ASPIRATE  Final   Special Requests Normal  Final   Gram Stain   Final    RARE WBC PRESENT,BOTH PMN AND MONONUCLEAR NO ORGANISMS SEEN    Culture   Final    Consistent with normal respiratory  flora. Performed at [facility], [place] [address] [place], [place] [address]    Report Status 02/01/2018 FINAL  Final  CSF culture     Status: None   Collection Time: 01/30/18  4:43 PM  Result Value Ref Range Status   Specimen Description CSF  Final   Special Requests Normal  Final   Gram Stain   Final    CYTOSPIN SMEAR WBC PRESENT, PREDOMINANTLY MONONUCLEAR NO ORGANISMS SEEN    Culture   Final    NO GROWTH 3 DAYS Performed at [facility], [place] [address], [place],  [address]    Report Status 02/03/2018 FINAL  Final    Lipid Panel No results for input(s): CHOL, TRIG, HDL, CHOLHDL, VLDL, LDLCALC in the last 72 hours.  Studies/Results: Dg Chest Port 1 View  Result Date: 02/08/2018 CLINICAL DATA:  Intubation EXAM: PORTABLE CHEST 1 VIEW COMPARISON:  02/03/2018 FINDINGS: Interval placement of right PICC line. The tip is in the upper right atrium approximately 3 cm deep to the cavoatrial junction. NG tube is in the stomach, unchanged. Stable position of the endotracheal tube. Low lung volumes. Bilateral pleural effusions and bibasilar opacities, likely atelectasis, somewhat improving since prior study. Heart is normal size. IMPRESSION: Low lung volumes with bilateral effusions and bibasilar opacities, likely atelectasis. These have improved slightly since prior study. Right PICC line tip in the upper right atrium approximately 3 cm below the cavoatrial junction. Electronically Signed   By: Rolm Baptise M.D.   On: 02/08/2018 09:50   US Abdomen Limited Ruq  Result Date: 02/09/2018 CLINICAL DATA:  Ascites. EXAM: ULTRASOUND ABDOMEN LIMITED RIGHT UPPER QUADRANT COMPARISON:  MRI 04/07/2017.  Ultrasound 02/16/2017. FINDINGS: Gallbladder: No gallstones or wall thickening visualized. No sonographic Murphy sign noted by sonographer. Common bile duct: Diameter: 4.2 mm Liver: 1.5 cm hyperechoic noted the right lobe liver consistent with hemangioma previously identified on prior MRI of  04/07/2017. No significant hepatic mass lesion identified. Portal vein is patent on color Doppler imaging with  normal direction of blood flow towards the liver. Trace ascites. IMPRESSION: 1.  No gallstones or biliary distention. 2. 1.5 cm hyperechoic nodule noted the right lobe liver consistent with hemangioma previously identified on prior MRI of 04/07/2017. No significant hepatic mass identified. 3.  Trace ascites. Electronically Signed   By: Marcello Moores  Register   On: 02/09/2018 06:31    Medications:  Scheduled: . aspirin  81 mg Per Tube Daily  . chlorhexidine gluconate (MEDLINE KIT)  15 mL Mouth Rinse BID  . Chlorhexidine Gluconate Cloth  6 each Topical Daily  . feeding supplement (PRO-STAT SUGAR FREE 64)  30 mL Per Tube BID  . furosemide  20 mg Intravenous Q12H  . heparin injection (subcutaneous)  5,000 Units Subcutaneous Q8H  . insulin aspart  0-15 Units Subcutaneous Q4H  . LORazepam  1 mg Intravenous Q8H  . mouth rinse  15 mL Mouth Rinse 10 times per day  . pantoprazole sodium  40 mg Per Tube Daily  . PHENObarbital  60 mg Intravenous BID  . potassium chloride  20 mEq Per Tube BID  . pregabalin  150 mg Per Tube Q8H  . sodium chloride flush  10-40 mL Intracatheter Q12H  . thiamine injection  100 mg Intravenous Daily   Continuous: . sodium chloride Stopped (02/08/18 0849)  . sodium chloride Stopped (02/06/18 1541)  . dexmedetomidine (PRECEDEX) IV infusion Stopped (02/07/18 0210)  . feeding supplement (OSMOLITE 1.2 CAL) 40 mL/hr at 02/09/18 0600  . levETIRAcetam 1,500 mg (02/08/18 2100)  . phenytoin (DILANTIN) IV 150 mg (02/09/18 0645)    Assessment/Plan: 67 year old female admitted 01/28/2018 with breakthrough seizure activity manifesting as status epilepticus. She had been compliant with Keppra 500 mg BID since being diagnosed with epilepsy one year previously. No known precipitants for her status epilepticus; patient denies EtOH or benzodiazepine withdrawal. .   Currently on 5  antiepileptic medications: -Keppra 1500 mg every 12 hours -Lorazepam 1 mg every 8 hours -Phenobarbital 60 mg twice daily (level on 7/3 was therapeutic at 16.4). Will order repeat level to ensure that it is stable within the therapeutic range.  -Pregabalin 150 mg q8h -Dilantin 150 mg BID -Still exhibiting sustained nystagmus with horizontal end-gaze on today's examination, most likely secondary to residual effects of toxic Dilantin level.  Given that she continues to be seizure free and is already on 5 anticonvulsants, will discontinue Dilantin and continue to observe.   35 minutes spent in the neurological evaluation and management of this critically ill patient.     LOS: 11 days   @Electronically  signed: Dr. Kerney Elbe 02/09/2018  7:49 AM

## 2018-02-10 ENCOUNTER — Encounter (HOSPITAL_COMMUNITY): Payer: Self-pay | Admitting: Radiology

## 2018-02-10 ENCOUNTER — Inpatient Hospital Stay (HOSPITAL_COMMUNITY): Payer: Commercial Managed Care - PPO

## 2018-02-10 LAB — BASIC METABOLIC PANEL
Anion gap: 10 (ref 5–15)
BUN: 25 mg/dL — AB (ref 8–23)
CO2: 29 mmol/L (ref 22–32)
CREATININE: 0.51 mg/dL (ref 0.44–1.00)
Calcium: 9.9 mg/dL (ref 8.9–10.3)
Chloride: 99 mmol/L (ref 98–111)
GFR calc Af Amer: >60 mL/min (ref >60)
Glucose, Bld: 93 mg/dL (ref 70–99)
Potassium: 4.5 mmol/L (ref 3.5–5.1)
SODIUM: 138 mmol/L (ref 135–145)

## 2018-02-10 LAB — GLUCOSE, CAPILLARY
GLUCOSE-CAPILLARY: 63 mg/dL — AB (ref 70–99)
GLUCOSE-CAPILLARY: 71 mg/dL (ref 70–99)
Glucose-Capillary: 110 mg/dL — ABNORMAL HIGH (ref 70–99)
Glucose-Capillary: 66 mg/dL — ABNORMAL LOW (ref 70–99)
Glucose-Capillary: 68 mg/dL — ABNORMAL LOW (ref 70–99)
Glucose-Capillary: 113 mg/dL — ABNORMAL HIGH (ref 70–99)
Glucose-Capillary: 88 mg/dL (ref 70–99)
Glucose-Capillary: 102 mg/dL — ABNORMAL HIGH (ref 70–99)
Glucose-Capillary: 92 mg/dL (ref 70–99)

## 2018-02-10 LAB — PHENYTOIN LEVEL, TOTAL: PHENYTOIN LVL: 4.3 ug/mL — AB (ref 10.0–20.0)

## 2018-02-10 MED ORDER — LEVETIRACETAM IN NACL 500 MG/100ML IV SOLN
500.0000 mg | Freq: Two times a day (BID) | INTRAVENOUS | Status: DC
  Administered 2018-02-10 – 2018-02-14 (×8): 500 mg via INTRAVENOUS
  Filled 2018-02-10 (×8): qty 100

## 2018-02-10 MED ORDER — IOPAMIDOL (ISOVUE-300) INJECTION 61%
15.0000 mL | INTRAVENOUS | Status: AC
  Administered 2018-02-10 (×2): 15 mL via ORAL

## 2018-02-10 MED ORDER — LORAZEPAM 2 MG/ML IJ SOLN
1.0000 mg | Freq: Three times a day (TID) | INTRAMUSCULAR | Status: DC
  Administered 2018-02-10 – 2018-02-11 (×4): 1 mg via INTRAVENOUS
  Filled 2018-02-10 (×4): qty 1

## 2018-02-10 MED ORDER — IOHEXOL 300 MG/ML  SOLN
100.0000 mL | Freq: Once | INTRAMUSCULAR | Status: AC | PRN
  Administered 2018-02-10: 100 mL via INTRAVENOUS

## 2018-02-10 NOTE — Progress Notes (Signed)
NEURO HOSPITALIST PROGRESS NOTE   Subjective: Patient awake alert, EXTUBATED, NAD on 2 L [place]. Bright affect. Excited that " the tube is gone", and she was able to drink some orange juice. Patient speaks in very low tone at this time.   Exam: Vitals:   02/10/18 0600 02/10/18 0700  BP: (!) 96/53 (!) 87/51  Pulse: 74 74  Resp: (!) 22 (!) 23  Temp:    SpO2: 100% 100%    Physical Exam   HEENT-  Normocephalic, no lesions, without obvious abnormality.  Normal external eye and conjunctiva. Two front teeth missing.    Cardiovascular- S1-S2 audible, pulses palpable throughout   Lungs-no rhonchi or wheezing noted, no excessive working breathing.  Saturations within normal limits on 2 L [place] Extremities- Warm, dry and intact Musculoskeletal-no joint tenderness, deformity or swelling Skin-warm and dry, intact  Neuro:  Mental Status: Awake, Alert, wearing her glasses in bed watching TV. Oriented, to person/place/ month/ event. thought content appropriate.  Speech fluent without evidence of aphasia, though patient is hypophonic at this time.   Able to follow commands without difficulty. Cranial Nerves: II:  Visual fields grossly normal, PERRL III,IV, VI: ptosis not present, extra-ocular motions intact bilaterally, 4-5 beats of end gaze nystagmus on end-gaze is noted V,VII: smile symmetric, facial light touch sensation normal bilaterally VIII: hearing normal bilaterally IX,X: uvula rises symmetrically XI: bilateral shoulder shrug XII: midline tongue extension Motor: Right : Upper extremity   5/5  Left:     Upper extremity   5/5  Lower extremity   5/5  Lower extremity   5/5 Tone and bulk:normal tone throughout; no atrophy noted Sensory: Pinprick and light touch intact throughout, bilaterally Deep Tendon Reflexes: 2+ patellar and biceps Plantars: Right: downgoing   Left: downgoing Cerebellar: normal finger-to-nose,  Gait: deferred    Medications:  Scheduled: .  aspirin  81 mg Per Tube Daily  . Chlorhexidine Gluconate Cloth  6 each Topical Daily  . furosemide  20 mg Intravenous Daily  . heparin injection (subcutaneous)  5,000 Units Subcutaneous Q8H  . LORazepam  1 mg Intravenous Q8H  . pantoprazole sodium  40 mg Per Tube Daily  . PHENObarbital  60 mg Intravenous BID  . potassium chloride  20 mEq Per Tube Daily  . pregabalin  150 mg Per Tube Q8H  . sodium chloride flush  10-40 mL Intracatheter Q12H  . thiamine injection  100 mg Intravenous Daily   Continuous: . sodium chloride Stopped (02/08/18 0849)  . sodium chloride Stopped (02/06/18 1541)  . levETIRAcetam Stopped (02/09/18 2232)   LOV:FIEPPI chloride, acetaminophen (TYLENOL) oral liquid 160 mg/5 mL, ketorolac, midazolam, nitroGLYCERIN, sodium chloride flush  Pertinent Labs/Diagnostics:   Dg Chest Port 1 View  Result Date: 02/08/2018 CLINICAL DATA:  Intubation EXAM: PORTABLE CHEST 1 VIEW COMPARISON:  02/03/2018 FINDINGS: Interval placement of right PICC line. The tip is in the upper right atrium approximately 3 cm deep to the cavoatrial junction. NG tube is in the stomach, unchanged. Stable position of the endotracheal tube. Low lung volumes. Bilateral pleural effusions and bibasilar opacities, likely atelectasis, somewhat improving since prior study. Heart is normal size. IMPRESSION: Low lung volumes with bilateral effusions and bibasilar opacities, likely atelectasis. These have improved slightly since prior study. Right PICC line tip in the upper right atrium approximately 3 cm below the cavoatrial junction. Electronically Signed   By: Lennette Bihari  [place] M.D.   On: 02/08/2018 09:50   US Abdomen Limited Ruq  Result Date: 02/09/2018 CLINICAL DATA:  Ascites. EXAM: ULTRASOUND ABDOMEN LIMITED RIGHT UPPER QUADRANT COMPARISON:  MRI 04/07/2017.  Ultrasound 02/16/2017. FINDINGS: Gallbladder: No gallstones or wall thickening visualized. No sonographic Murphy sign noted by sonographer. Common bile duct:  Diameter: 4.2 mm Liver: 1.5 cm hyperechoic noted the right lobe liver consistent with hemangioma previously identified on prior MRI of 04/07/2017. No significant hepatic mass lesion identified. Portal vein is patent on color Doppler imaging with normal direction of blood flow towards the liver. Trace ascites. IMPRESSION: 1.  No gallstones or biliary distention. 2. 1.5 cm hyperechoic nodule noted the right lobe liver consistent with hemangioma previously identified on prior MRI of 04/07/2017. No significant hepatic mass identified. 3.  Trace ascites. Electronically Signed   By: Marcello Moores  Register   On: 02/09/2018 06:31    Impression:67 year old female admitted 01/28/2018 with breakthrough seizure activity manifesting as status epilepticus. She had been compliant with Keppra 500 mg BID since being diagnosed with epilepsy one year previously.  1. No known precipitants for her status epilepticus; patient denies EtOH or benzodiazepine withdrawal.  2. Patient extubated on 02/09/18, and neurologically doing well. No seizures noted since Dilantin discontinued.  Recommendations:  -Decreasing Keppra to 500 mg BID today (order has been changed). May be able to taper off completely in a few days now that she is on phenobarbital. Of note, she had breakthrough seizure which manifested as status epilepticus while compliant with Keppra, so this medication likely is not an optimal choice for her -Lorazepam 1 mg every 8 hours. Consider tapering her off this benzodiazepine, now that she is extubated.  -Phenobarbital 60 mg twice daily (Level on 02/09/18 was 20.6 therapeutic)  -Pregabalin 150 mg q8h -Dilantin was stopped on 02/09/18 -Neurology to sign off at this time. Please call with any further questions.  LOS: 12 days    Laurey Morale, MSN, NP-C [facility] [phone]  Electronically signed: Dr. Kerney Elbe 02/10/2018, 8:16 AM

## 2018-02-10 NOTE — Progress Notes (Signed)
PULMONARY / CRITICAL CARE MEDICINE   Name: Samantha Atkins MRN: [id number] DOB: 06/13/1951    ADMISSION DATE:  01/28/2018 CONSULTATION DATE: 01/29/2018     REFERRING MD:  Dr. Venora Maples   CHIEF COMPLAINT:  Seizure   HISTORY OF PRESENT ILLNESS:   67 year old female with PMH of Seizure, Osteoporosis, Depression, GERD, Insomnia  Presents to ED on 6/23 s/p seizure. Per Husband patient was found seizing and unresponsive. When EMS arrived patient was foaming at the mouth and was given 5 mg Versed. On arrival to ED patient continued to have seizures requiring 2 mg Ativan. Head CT negative. Intubated for airway protection. Given 1 mg Keppra. Neurology consulted. PCCM asked to admit.    6/29 The patient remains on the ventilator. Was tried on SBT but became tachypneic this AM. The patient is no longer seizing. Has just come off pressors.  7/1 She continues to be intubated mechanically ventilated.  She is off of pressors.  She is sedated on a low-dose of Precedex and appropriately nodding to questions for me.  She has had no overt seizure activity.  7/2 she continues to be intubated and mechanically ventilated.  She is on no pressors.  Her Precedex was held prior to my examination and she was nodding appropriately to questions.  She was tolerating a pressure support of 9.  7/3 some hypotension during sleep last night for which she was given a bolus of fluid.  She is very alert and interactive this morning and not complaining of dyspnea after I switched her to CPAP with pressure support of 10.  No further seizure activity. 7/4 is very alert and appropriately nodding to questions this morning complaining of no dyspnea again on a pressure support of 10.  Pertinent to the etiology of her pleural effusions and ascites she denies any history of alcohol or known liver disease.  7/5 she is again very alert and appropriately interactive.  Her Dilantin has been discontinued.  There is been no further seizure  activity.  She is not complaining of any dyspnea after I switched her to a pressure support of 7 during my visit.  7/6 extubation yesterday has been well-tolerated.  She has no complaint of dyspnea today.  Pertinent to the ascites and large bilateral pleural effusions which have been treated with diuresis, she denies a history of alcohol, she denies a history of abdominal pain, she denies a history of weight loss.  Her Keppra is being tapered to off by neurology as her seizures occurred while she was on that agent.  Dilantin is also been discontinued.  She has had no further seizure activity.     VITAL SIGNS: BP (!) 87/51   Pulse 74   Temp 97.7 F (36.5 C) (Oral)   Resp (!) 23   Ht 5\' 2"  (1.575 m)   Wt 86 lb 13.8 oz (39.4 kg)   SpO2 100%   BMI 15.89 kg/m     CVP:  [0 mmHg-11 mmHg] 9 mmHgVENTILATOR SETTINGS:    INTAKE / OUTPUT: I/O last 3 completed shifts: In: 776.7 [I.V.:10; NG/GT:241.3; IV Piggyback:525.4] Out: 3725 [Urine:3725]  PHYSICAL EXAMINATION: General: Thin and very frail-appearing elderly female who is orally intubated and mechanically ventilated.  She is very alert using electronics and writing notes      Neuro: Nodding appropriately to questions and following simple instructions.  Moving all fours.  Pupils are equal.      CV: S1 and S2 are regular without murmur rub or gallop.  There is no dependent edema.      PULM: Respirations are unlabored on a pressure support of 7, there is symmetric air movement, no wheezes, very few scattered rhonchi .       GI: Abdomen is flat and soft without any organomegaly masses tenderness guarding or rebound.          Extremities: No edema   LABS:  BMET Recent Labs  Lab 02/08/18 0541 02/09/18 0400 02/10/18 0444  NA 143 140 138  K 2.9* 4.0 4.5  CL 109 100 99  CO2 27 31 29   BUN 11 20 25*  CREATININE 0.39* 0.56 0.51  GLUCOSE 125* 94 93    Electrolytes Recent Labs  Lab 02/07/18 0343 02/08/18 0541 02/09/18 0400  02/10/18 0444  CALCIUM 8.2* 7.7* 10.0 9.9  MG 1.7 1.8 2.2  --   PHOS  --  3.1  --   --     CBC Recent Labs  Lab 02/07/18 0343 02/08/18 0541 02/09/18 0400  WBC 3.9* 3.1* 4.3  HGB 9.0* 7.9* 9.3*  HCT 28.3* 24.8* 29.2*  PLT 193 180 246    Coag's No results for input(s): APTT, INR in the last 168 hours.  Sepsis Markers No results for input(s): LATICACIDVEN, PROCALCITON, O2SATVEN in the last 168 hours.  ABG Recent Labs  Lab 02/03/18 1518 02/07/18 0357 02/09/18 0440  PHART 7.557* 7.523* 7.573*  PCO2ART 27.8* 35.5 31.8*  PO2ART 74.0* 90.4 144*    Liver Enzymes Recent Labs  Lab 02/06/18 0500 02/07/18 0343 02/09/18 0400  AST  --   --  20  ALT  --   --  25  ALKPHOS  --   --  64  BILITOT  --   --  0.6  ALBUMIN 1.9* 3.1* 4.9    Cardiac Enzymes Recent Labs  Lab 02/06/18 2204 02/07/18 0343 02/07/18 0940  TROPONINI 0.06* 0.06* 0.07*    Glucose Recent Labs  Lab 02/09/18 2356 02/10/18 0433 02/10/18 0521 02/10/18 0813 02/10/18 0852 02/10/18 0920  GLUCAP 74 63* 110* 66* 68* 71    Imaging No results found.   STUDIES:  CXR 6/24 > Normal sized heart. Tortuous and calcified thoracic aorta. Endotracheal tube tip 3.6 cm above the carina. Nasogastric tube extending into the stomach. Bilateral nipple shadows. Clear lungs. Proximal left humerus fixation hardware. CT Head 6/24 > No acute abnormality. 2. Stable diffuse cerebral and cerebellar atrophy. 3. Stable chronic small vessel white matter ischemic changes in both cerebral hemispheres. LT EEG 6/25>>> This is day 2 of intensive EEG monitoring with simultaneous video monitoring is abnormal for several reasons #1 electrographic seizures arising from right anterior frontal and paracentral cortex with subsequent resolution.  Last seizure around 12 noon.  #2 continuous right hemispheric background activity slowing with superimposed right anterior frontal and anterior paracentral spike still present suggestive of  neuronal dysfunction and significant cortical irritability in right anterior and paracentral cortex.    CULTURES: Blood 6/24 >> Urine 6/24 >>neg  LP 6/25>>> Sputum 6/25>>>normal flora   ANTIBIOTICS: unasyn 6/25>>>  SIGNIFICANT EVENTS: 6/23 > Presents to ED  6/25 status epilepticus requiring burst suppression   LINES/TUBES: ETT 6/24 >> R fem CVL 6/24>>>  DISCUSSION: 67yo female with hx seizure disorder admitted with AMS requiring intubation for administration of anticonvulsants.   Developed ongoing status 6/25, she was heavily sedated to burst suppression.    ASSESSMENT / PLAN:  Respiratory Insufficieny s/p Seizure  ?aspiration even 6/25 P:   CT scan of the chest showed moderate  to large bilateral pleural effusions with compressive atelectasis.  She was diuresed and is been extubated on 7/5.  Extubation has been well-tolerated.  It is not clear to me why she had large effusions and ascites.  She does not have significant liver function derangements, she does not have a history of alcohol intake, she does not have a history of weight loss.  I have ordered a CT of the abdomen and pelvis looking for an occult malignancy accounting for the ascites and effusions  Recent Labs    02/08/18 0541 02/09/18 0400  HGB 7.9* 9.3*    Hematological:   She has a relative pancytopenia and macrocytosis without significant liver disease and normal B12 and folate.  A myelodysplastic syndrome is suspected.   Hyperglycemia    CBG (last 3)  Recent Labs    02/10/18 0813 02/10/18 0852 02/10/18 0920  GLUCAP 66* 68* 71    P:   SSI     Encephalopathy in setting of Status Epilepticus with breakthrough status  H/O Multiple Falls, Osteo, Seizure on Previous admission 02/2017, Depression  Status epilepticus     She is currently seizure-free and interactive.  Anticonvulsants are being tapered as noted and I am going to observe her overnight in the intensive care unit in order to ensure that she  will be seizure-free free on Lyrica and phenobarbital prior to transfer.   Lars Masson, MD Critical Care Medicine

## 2018-02-10 NOTE — Plan of Care (Signed)
Pt participating with turns; will work with PT/OT today

## 2018-02-10 NOTE — Plan of Care (Signed)
Pt tolerated room air today, began using incentive spirometer and reached 1041ml. RN will continue to encourage IS use as well as progress to out of bed

## 2018-02-10 NOTE — Evaluation (Signed)
Occupational Therapy Evaluation Patient Details Name: Samantha Atkins MRN: [id number] DOB: 1951/04/23 Today's Date: 02/10/2018    History of Present Illness 67 yo female with past medical history of Anxiety, Arthritis, Depression, Fracture of humerus, proximal, left, closed (08/07/2015), Fracture, humerus closed (07-31-15 fell), GERD (gastroesophageal reflux disease), Headache, Insomnia, Migraines, and Osteoporosis. Pt presenting to ED with seizures on 6/23. Intubated on 6/23; extubated 7/5. MRI of brain negative for acute findings and moderate brain atrophy.    Clinical Impression   PTA, pt was living with her husband and was performing ADLs and using RW for functional mobility. Pt currently requiring Min A for UB ADLs, LB ADLs, toileting, and functional mobility with RW. Pt presenting with decreased strength, balance, and endurance. VSS throughout session. Pt highly motivated to participate in therapy and return to independence, and pt has supportive family. Pt would benefit from further acute OT to facilitate safe dc. Recommend dc to CIR for intensive OT to optimize safety and independence with ADLs, decreased caregiver burden, and facilitate return to PLOF.      Follow Up Recommendations  CIR;Supervision/Assistance - 24 hour    Equipment Recommendations  None recommended by OT;Other (comment)(Defer to next venue)    Recommendations for Other Services Rehab consult;PT consult;Speech consult     Precautions / Restrictions Precautions Precautions: Fall;Other (comment)(Seizures) Restrictions Weight Bearing Restrictions: No      Mobility Bed Mobility Overal bed mobility: Needs Assistance Bed Mobility: Supine to Sit     Supine to sit: Min guard     General bed mobility comments: Min Guard A for safety  Transfers Overall transfer level: Needs assistance Equipment used: Rolling walker (2 wheeled) Transfers: Sit to/from Stand Sit to Stand: Min assist         General transfer  comment: Min A for stability and to power up into standing. VCs for hand placement    Balance Overall balance assessment: Needs assistance Sitting-balance support: No upper extremity supported;Feet supported Sitting balance-Leahy Scale: Fair     Standing balance support: Bilateral upper extremity supported;During functional activity Standing balance-Leahy Scale: Poor Standing balance comment: Reliant on UE support                           ADL either performed or assessed with clinical judgement   ADL Overall ADL's : Needs assistance/impaired Eating/Feeding: Set up;Supervision/ safety;Sitting   Grooming: Minimal assistance;Standing   Upper Body Bathing: Minimal assistance;Sitting   Lower Body Bathing: Minimal assistance;Sit to/from stand   Upper Body Dressing : Minimal assistance;Sitting Upper Body Dressing Details (indicate cue type and reason): Donning new gown Lower Body Dressing: Minimal assistance;Sit to/from stand Lower Body Dressing Details (indicate cue type and reason): Pt donning socks at EOB with signfiicant amount of time and effort. Min A for standing balance Toilet Transfer: Minimal assistance;Ambulation;Regular Toilet;Grab bars;RW Armed forces technical officer Details (indicate cue type and reason): Min A for safe descent and to power up into standing.  Toileting- Clothing Manipulation and Hygiene: Minimal assistance;Sit to/from stand Toileting - Clothing Manipulation Details (indicate cue type and reason): pt performing toilet hygiene. Min A for clothing management and then to make sure pt was clean after BM     Functional mobility during ADLs: Minimal assistance;Rolling walker General ADL Comments: Pt with decreased strength, balance, and activity tolerance. Pt highly motivated to participate in therapy and return to PLOF     Vision Baseline Vision/History: Wears glasses Wears Glasses: At all times Patient Visual Report: Blurring  of vision;Diplopia Additional  Comments: Need to further assess     Perception     Praxis      Pertinent Vitals/Pain Pain Assessment: No/denies pain     Hand Dominance Right   Extremity/Trunk Assessment Upper Extremity Assessment Upper Extremity Assessment: Generalized weakness   Lower Extremity Assessment Lower Extremity Assessment: Defer to PT evaluation   Cervical / Trunk Assessment Cervical / Trunk Assessment: Kyphotic   Communication Communication Communication: No difficulties;Other (comment)(Soft spoken)   Cognition Arousal/Alertness: Awake/alert Behavior During Therapy: WFL for tasks assessed/performed Overall Cognitive Status: Within Functional Limits for tasks assessed                                     General Comments  VSS throughout session    Exercises     Shoulder Instructions      Home Living Family/patient expects to be discharged to:: Private residence Living Arrangements: Spouse/significant other Available Help at Discharge: Family Type of Home: House Home Access: Stairs to enter [employer] of Steps: 3(stairs up to house as well) Entrance Stairs-Rails: None Home Layout: One level     Bathroom Shower/Tub: Corporate investment banker: Standard     Home Equipment: Environmental consultant - 2 wheels;Cane - single point;Bedside commode;Grab bars - tub/shower          Prior Functioning/Environment Level of Independence: Independent with assistive device(s)        Comments: Using RW since hip surgery last year        OT Problem List: Decreased strength;Decreased range of motion;Decreased activity tolerance;Impaired balance (sitting and/or standing);Pain;Decreased knowledge of use of DME or AE;Decreased knowledge of precautions      OT Treatment/Interventions: Self-care/ADL training;Therapeutic exercise;Energy conservation;DME and/or AE instruction;Therapeutic activities;Patient/family education    OT Goals(Current goals can be found in  the care plan section) Acute Rehab OT Goals Patient Stated Goal: Return to independence OT Goal Formulation: With patient Time For Goal Achievement: 02/24/18 Potential to Achieve Goals: Good ADL Goals Pt Will Perform Grooming: with modified independence;standing Pt Will Perform Upper Body Dressing: with modified independence;sitting Pt Will Perform Lower Body Dressing: sit to/from stand;with modified independence Pt Will Transfer to Toilet: with modified independence;ambulating;regular height toilet Pt Will Perform Toileting - Clothing Manipulation and hygiene: with modified independence;sit to/from stand  OT Frequency: Min 2X/week   Barriers to D/C:            Co-evaluation PT/OT/SLP Co-Evaluation/Treatment: Yes Reason for Co-Treatment: To address functional/ADL transfers;Complexity of the patient's impairments (multi-system involvement) PT goals addressed during session: Mobility/safety with mobility OT goals addressed during session: ADL's and self-care      AM-PAC PT "6 Clicks" Daily Activity     Outcome Measure Help from another person eating meals?: None Help from another person taking care of personal grooming?: A Little Help from another person toileting, which includes using toliet, bedpan, or urinal?: A Little Help from another person bathing (including washing, rinsing, drying)?: A Little Help from another person to put on and taking off regular upper body clothing?: A Little Help from another person to put on and taking off regular lower body clothing?: A Little 6 Click Score: 19   End of Session Equipment Utilized During Treatment: Rolling walker Nurse Communication: Mobility status;Other (comment)(BM)  Activity Tolerance: Patient tolerated treatment well Patient left: in chair;with call bell/phone within reach  OT Visit Diagnosis: Unsteadiness on feet (R26.81);Other abnormalities of gait and  mobility (R26.89);Muscle weakness (generalized) (M62.81);Pain Pain -  Right/Left: Left Pain - part of body: Leg;Hip                Time: 4536-4680 OT Time Calculation (min): 33 min Charges:  OT General Charges $OT Visit: 1 Visit OT Evaluation $OT Eval Moderate Complexity: 1 Mod G-Codes:     [place] MSOT, OTR/L Acute Rehab Pager: [phone] Office: [place] 02/10/2018, 6:04 PM

## 2018-02-10 NOTE — Progress Notes (Signed)
Inpatient Rehabilitation  Per OT request, patient was screened by Gunnar Fusi for appropriateness for an Inpatient Acute Rehab consult.  At this time we are planning to follow along for PT recommednations as well.  Call if questions.   Carmelia Roller., CCC/SLP Admission Coordinator  Welcome  Cell [phone]

## 2018-02-11 LAB — CBC WITH DIFFERENTIAL/PLATELET
ABS IMMATURE GRANULOCYTES: 0 10*3/uL (ref 0.0–0.1)
BASOS ABS: 0 10*3/uL (ref 0.0–0.1)
BASOS PCT: 1 %
Eosinophils Absolute: 0.2 10*3/uL (ref 0.0–0.7)
Eosinophils Relative: 3 %
HCT: 32.1 % — ABNORMAL LOW (ref 36.0–46.0)
Hemoglobin: 10.5 g/dL — ABNORMAL LOW (ref 12.0–15.0)
IMMATURE GRANULOCYTES: 0 %
Lymphocytes Relative: 18 %
Lymphs Abs: 1.1 10*3/uL (ref 0.7–4.0)
MCH: 33.9 pg (ref 26.0–34.0)
MCHC: 32.7 g/dL (ref 30.0–36.0)
MCV: 103.5 fL — AB (ref 78.0–100.0)
MONOS PCT: 8 %
Monocytes Absolute: 0.5 10*3/uL (ref 0.1–1.0)
NEUTROS ABS: 4.5 10*3/uL (ref 1.7–7.7)
NEUTROS PCT: 70 %
PLATELETS: 329 10*3/uL (ref 150–400)
RBC: 3.1 MIL/uL — ABNORMAL LOW (ref 3.87–5.11)
RDW: 11.8 % (ref 11.5–15.5)
WBC: 6.3 10*3/uL (ref 4.0–10.5)

## 2018-02-11 LAB — GLUCOSE, CAPILLARY
GLUCOSE-CAPILLARY: 97 mg/dL (ref 70–99)
Glucose-Capillary: 76 mg/dL (ref 70–99)
Glucose-Capillary: 91 mg/dL (ref 70–99)
Glucose-Capillary: 114 mg/dL — ABNORMAL HIGH (ref 70–99)
Glucose-Capillary: 99 mg/dL (ref 70–99)

## 2018-02-11 LAB — COMPREHENSIVE METABOLIC PANEL
ALK PHOS: 81 U/L (ref 38–126)
ALT: 26 U/L (ref 0–44)
ANION GAP: 9 (ref 5–15)
AST: 27 U/L (ref 15–41)
Albumin: 4.4 g/dL (ref 3.5–5.0)
BILIRUBIN TOTAL: 0.5 mg/dL (ref 0.3–1.2)
BUN: 19 mg/dL (ref 8–23)
CALCIUM: 9.3 mg/dL (ref 8.9–10.3)
CO2: 28 mmol/L (ref 22–32)
CREATININE: 0.6 mg/dL (ref 0.44–1.00)
Chloride: 98 mmol/L (ref 98–111)
Glucose, Bld: 90 mg/dL (ref 70–99)
Potassium: 4.4 mmol/L (ref 3.5–5.1)
SODIUM: 135 mmol/L (ref 135–145)
TOTAL PROTEIN: 6.3 g/dL — AB (ref 6.5–8.1)

## 2018-02-11 MED ORDER — FUROSEMIDE 20 MG PO TABS
20.0000 mg | ORAL_TABLET | Freq: Every day | ORAL | Status: DC
  Administered 2018-02-11 – 2018-02-14 (×4): 20 mg via ORAL
  Filled 2018-02-11 (×4): qty 1

## 2018-02-11 MED ORDER — NAPROXEN 250 MG PO TABS
250.0000 mg | ORAL_TABLET | Freq: Two times a day (BID) | ORAL | Status: DC | PRN
  Administered 2018-02-11 – 2018-02-12 (×4): 250 mg via ORAL
  Filled 2018-02-11 (×4): qty 1

## 2018-02-11 MED ORDER — POTASSIUM CHLORIDE CRYS ER 20 MEQ PO TBCR
20.0000 meq | EXTENDED_RELEASE_TABLET | Freq: Every day | ORAL | Status: DC
  Administered 2018-02-11 – 2018-02-14 (×4): 20 meq via ORAL
  Filled 2018-02-11 (×4): qty 1

## 2018-02-11 MED ORDER — PANTOPRAZOLE SODIUM 40 MG PO TBEC
40.0000 mg | DELAYED_RELEASE_TABLET | Freq: Every day | ORAL | Status: DC
  Administered 2018-02-11 – 2018-02-14 (×4): 40 mg via ORAL
  Filled 2018-02-11 (×4): qty 1

## 2018-02-11 MED ORDER — LORAZEPAM 1 MG PO TABS
1.0000 mg | ORAL_TABLET | ORAL | Status: DC | PRN
  Administered 2018-02-11 – 2018-02-14 (×17): 1 mg via ORAL
  Filled 2018-02-11 (×18): qty 1

## 2018-02-11 NOTE — Progress Notes (Signed)
PULMONARY / CRITICAL CARE MEDICINE   Name: Samantha Atkins MRN: [id number] DOB: [birth year]    ADMISSION DATE:  01/28/2018 CONSULTATION DATE: 01/29/2018     REFERRING MD:  Dr. Venora Maples   CHIEF COMPLAINT:  Seizure   HISTORY OF PRESENT ILLNESS:   67 year old female with PMH of Seizure, Osteoporosis, Depression, GERD, Insomnia  Presents to ED on 6/23 s/p seizure. Per Husband patient was found seizing and unresponsive. When EMS arrived patient was foaming at the mouth and was given 5 mg Versed. On arrival to ED patient continued to have seizures requiring 2 mg Ativan. Head CT negative. Intubated for airway protection. Given 1 mg Keppra. Neurology consulted. PCCM asked to admit.    6/29 The patient remains on the ventilator. Was tried on SBT but became tachypneic this AM. The patient is no longer seizing. Has just come off pressors.  7/1 She continues to be intubated mechanically ventilated.  She is off of pressors.  She is sedated on a low-dose of Precedex and appropriately nodding to questions for me.  She has had no overt seizure activity.  7/2 she continues to be intubated and mechanically ventilated.  She is on no pressors.  Her Precedex was held prior to my examination and she was nodding appropriately to questions.  She was tolerating a pressure support of 9.  7/3 some hypotension during sleep last night for which she was given a bolus of fluid.  She is very alert and interactive this morning and not complaining of dyspnea after I switched her to CPAP with pressure support of 10.  No further seizure activity. 7/4 is very alert and appropriately nodding to questions this morning complaining of no dyspnea again on a pressure support of 10.  Pertinent to the etiology of her pleural effusions and ascites she denies any history of alcohol or known liver disease.  7/5 she is again very alert and appropriately interactive.  Her Dilantin has been discontinued.  There is been no further seizure  activity.  She is not complaining of any dyspnea after I switched her to a pressure support of 7 during my visit.  7/6 extubation yesterday has been well-tolerated.  She has no complaint of dyspnea today.  Pertinent to the ascites and large bilateral pleural effusions which have been treated with diuresis, she denies a history of alcohol, she denies a history of abdominal pain, she denies a history of weight loss.  Her Keppra is being tapered to off by neurology as her seizures occurred while she was on that agent.  Dilantin is also been discontinued.  She has had no further seizure activity.    7/7 complaining  today only of some mild headache and anxiety.  No seizure activity.  She has been ambulated and apparently was able to walk to the end of the hall yesterday.  VITAL SIGNS: BP 103/62   Pulse 89   Temp 98.6 F (37 C) (Axillary)   Resp (!) 28   Ht 5\' 2"  (1.575 m)   Wt 81 lb 2.1 oz (36.8 kg)   SpO2 98%   BMI 14.84 kg/m      VENTILATOR SETTINGS:    INTAKE / OUTPUT: I/O last 3 completed shifts: In: 974.3 [P.O.:680; I.V.:94.3; IV Piggyback:200] Out: 2500 [Urine:2500]  PHYSICAL EXAMINATION: General: Thin and very frail-appearing, soft-spoken but oriented and appropriately interactive   Neuro: Appropriately interactive.  Pupils equal, EOMs full, face symmetrical, moving all fours       CV: S1 and  S2 are regular with an occasional ectopic, there is no murmur rub or gallop.  There is no dependent edema, there is no JVD     PULM: Respirations are unlabored, there is symmetric air movement, few scattered rhonchi and no wheezes.         GI: The abdomen is flat and soft without any organomegaly masses tenderness guarding or rebound.           Extremities: No edema   LABS:  BMET Recent Labs  Lab 02/09/18 0400 02/10/18 0444 02/11/18 0426  NA 140 138 135  K 4.0 4.5 4.4  CL 100 99 98  CO2 31 29 28   BUN 20 25* 19  CREATININE 0.56 0.51 0.60  GLUCOSE 94 93 90     Electrolytes Recent Labs  Lab 02/07/18 0343 02/08/18 0541 02/09/18 0400 02/10/18 0444 02/11/18 0426  CALCIUM 8.2* 7.7* 10.0 9.9 9.3  MG 1.7 1.8 2.2  --   --   PHOS  --  3.1  --   --   --     CBC Recent Labs  Lab 02/08/18 0541 02/09/18 0400 02/11/18 0426  WBC 3.1* 4.3 6.3  HGB 7.9* 9.3* 10.5*  HCT 24.8* 29.2* 32.1*  PLT 180 246 329    Coag's No results for input(s): APTT, INR in the last 168 hours.  Sepsis Markers No results for input(s): LATICACIDVEN, PROCALCITON, O2SATVEN in the last 168 hours.  ABG Recent Labs  Lab 02/07/18 0357 02/09/18 0440  PHART 7.523* 7.573*  PCO2ART 35.5 31.8*  PO2ART 90.4 144*    Liver Enzymes Recent Labs  Lab 02/07/18 0343 02/09/18 0400 02/11/18 0426  AST  --  20 27  ALT  --  25 26  ALKPHOS  --  64 81  BILITOT  --  0.6 0.5  ALBUMIN 3.1* 4.9 4.4    Cardiac Enzymes Recent Labs  Lab 02/06/18 2204 02/07/18 0343 02/07/18 0940  TROPONINI 0.06* 0.06* 0.07*    Glucose Recent Labs  Lab 02/10/18 0920 02/10/18 1122 02/10/18 1524 02/10/18 1911 02/10/18 2304 02/11/18 0337  GLUCAP 71 113* 88 102* 92 76    Imaging Ct Abdomen Pelvis W Contrast  Result Date: 02/10/2018 CLINICAL DATA:  Ascites. No ETOH or significant liver disease. Ovarian/fallopian/peritoneal cancer suspected. EXAM: CT ABDOMEN AND PELVIS WITH CONTRAST TECHNIQUE: Multidetector CT imaging of the abdomen and pelvis was performed using the standard protocol following bolus administration of intravenous contrast. CONTRAST:  175mL OMNIPAQUE IOHEXOL 300 MG/ML  SOLN COMPARISON:  Chest CT dated 02/05/2018. FINDINGS: Lower chest: Small bilateral pleural effusions with adjacent compressive atelectasis. The pleural effusions have decreased in size compared to earlier chest CT of 02/05/2018. Hepatobiliary: Subtle hypodense lesion within the RIGHT liver lobe, corresponding to a benign hemangioma per earlier MRI abdomen of 04/07/2017. No other focal liver abnormality is  seen. Gallbladder appears normal. No bile duct dilatation. Pancreas: Difficult to definitively characterize due to mild patient motion artifact, but grossly normal. No peripancreatic fluid seen. Spleen: Normal in size without focal abnormality. Adrenals/Urinary Tract: Adrenal glands appear normal. Kidneys are unremarkable without mass, stone or hydronephrosis. No ureteral or bladder calculi identified. Bladder decompressed by Foley catheter. Stomach/Bowel: No dilated small bowel loops. Rectosigmoid colon moderately distended with stool and oral contrast material. Pelvic portion of the bowel otherwise difficult to characterize due to obscuring metallic artifact emanating from patient's bilateral hip prostheses. No convincing evidence of bowel wall thickening or bowel wall inflammation within the visualized portions of the abdomen and pelvis. Vascular/Lymphatic: Aortic  atherosclerosis. No enlarged abdominal or pelvic lymph nodes. Reproductive: Status post hysterectomy. No obvious adnexal mass or free fluid, however, the adnexal regions are partially obscured by metallic artifact emanating from patient's bilateral hip prostheses. Other: The ascites appreciated within the upper abdomen on earlier chest CT of 02/05/2018 is not present on today's exam. There is no ascites appreciated on today's study. There could be of small amount of obscured ascites within the lower pelvis, as detailed above. No evidence of abscess. No free intraperitoneal air seen. No evidence of omental caking seen. No evidence of peritoneal or mesenteric implants. Musculoskeletal: Chronic appearing compression deformities of the L1 through L3 vertebral bodies, likely accentuated by chronic degenerative Schmorl's nodes. Additional chronic compression fracture deformity at the T8 vertebral body level, as also seen on earlier chest CT. No acute appearing osseous abnormality. No evidence of osseous metastasis seen. IMPRESSION: 1. No appreciable ascites  on today's exam. The ascites seen within the upper abdomen on earlier chest CT of 02/05/2018 is no longer present. As above, there could be a small amount of ascites within the lower pelvis which is obscured by metallic artifact emanating from patient's bilateral hip prostheses. 2. Overall, no acute findings within the abdomen or pelvis. No omental caking, mesenteric or peritoneal implants, or other evidence of neoplastic process. Again, the lower pelvis is incompletely visualized due to obscuring metallic artifact emanating from patient's bilateral hip prostheses. 3. Small bilateral pleural effusions with adjacent compressive atelectasis. The pleural effusions have decreased in size compared to the earlier chest CT. 4. Benign hemangioma within the RIGHT liver lobe, more definitively characterized on earlier abdomen MRI. 5. Aortic atherosclerosis. 6. Chronic appearing compression fracture deformities of the L1 through L3 vertebral bodies, 50-70% compression anteriorly at each level, accentuated by chronic degenerative Schmorl's nodes. No acute or suspicious osseous findings. Electronically Signed   By: Franki Cabot M.D.   On: 02/10/2018 14:53     STUDIES:  CXR 6/24 > Normal sized heart. Tortuous and calcified thoracic aorta. Endotracheal tube tip 3.6 cm above the carina. Nasogastric tube extending into the stomach. Bilateral nipple shadows. Clear lungs. Proximal left humerus fixation hardware. CT Head 6/24 > No acute abnormality. 2. Stable diffuse cerebral and cerebellar atrophy. 3. Stable chronic small vessel white matter ischemic changes in both cerebral hemispheres. LT EEG 6/25>>> This is day 2 of intensive EEG monitoring with simultaneous video monitoring is abnormal for several reasons #1 electrographic seizures arising from right anterior frontal and paracentral cortex with subsequent resolution.  Last seizure around 12 noon.  #2 continuous right hemispheric background activity slowing with  superimposed right anterior frontal and anterior paracentral spike still present suggestive of neuronal dysfunction and significant cortical irritability in right anterior and paracentral cortex.    CULTURES: Blood 6/24 >> Urine 6/24 >>neg  LP 6/25>>> Sputum 6/25>>>normal flora   ANTIBIOTICS: unasyn 6/25>>>  SIGNIFICANT EVENTS: 6/23 > Presents to ED  6/25 status epilepticus requiring burst suppression   LINES/TUBES: ETT 6/24 >> R fem CVL 6/24>>>  DISCUSSION: 67yo female with hx seizure disorder admitted with AMS requiring intubation for administration of anticonvulsants.   Developed ongoing status 6/25, she was heavily sedated to burst suppression.    ASSESSMENT / PLAN:  Respiratory Insufficieny s/p Seizure  ?aspiration even 6/25 P:   CT scan of the chest showed moderate to large bilateral pleural effusions with compressive atelectasis.  She was diuresed and extubated on 7/5.  Extubation has been well-tolerated.  It is not clear to me why she  had large effusions and ascites.  She does not have significant cardiac dysfunction by echo.  She does not have significant liver function derangements, she does not have a history of alcohol intake, she does not have a history of weight loss.  A CT scan of the abdomen pelvis performed yesterday showed a previously known hemangioma in the liver and no evidence of intra-abdominal malignancy.   Recent Labs    02/09/18 0400 02/11/18 0426  HGB 9.3* 10.5*    Hematological:   She has a relative pancytopenia and macrocytosis without significant liver disease and normal B12 and folate.  Effects from previously administered Dilantin versus a myelodysplastic syndrome is suspected.   Hyperglycemia    CBG (last 3)  Recent Labs    02/10/18 1911 02/10/18 2304 02/11/18 0337  GLUCAP 102* 92 76    P:   SSI     Encephalopathy in setting of Status Epilepticus with breakthrough status  H/O Multiple Falls, Osteo, Seizure on Previous admission  02/2017, Depression  Status epilepticus     She is currently seizure-free and interactive.  They convulsants have been tapered to a combination of Keppra phenobarbital and Lyrica.  I have ordered a phenobarbital level for the morning of 7/8.  She has remained seizure-free for several days and I think it is safe to transfer her out of the intensive care unit.    Lars Masson, MD Critical Care Medicine

## 2018-02-11 NOTE — Evaluation (Addendum)
Late Entry Evaluation Completed Late Day 7/6.  Physical Therapy Evaluation Patient Details Name: Samantha Atkins MRN: [id number] DOB: 1951/07/22 Today's Date: 02/11/2018   History of Present Illness  67 yo female with past medical history of Anxiety, Arthritis, Depression, Fracture of humerus, proximal, left, closed (08/07/2015), Fracture, humerus closed (07-31-15 fell), GERD (gastroesophageal reflux disease), Headache, Insomnia, Migraines, and Osteoporosis. Pt presenting to ED with seizures on 6/23. Intubated on 6/23; extubated 7/5. MRI of brain negative for acute findings and moderate brain atrophy.  Clinical Impression  Pt admitted with/for seizure and has been intubate for and extended time with resultant deconditioning and the need for minimal or more assist for mobility.  Pt currently limited functionally due to the problems listed. ( See problems list.)   Pt will benefit from PT to maximize function and safety in order to get ready for next venue listed below.     Follow Up Recommendations CIR    Equipment Recommendations  None recommended by PT    Recommendations for Other Services Rehab consult     Precautions / Restrictions Precautions Precautions: Fall;Other (comment)(Seizures) Restrictions Weight Bearing Restrictions: No      Mobility  Bed Mobility Overal bed mobility: Needs Assistance Bed Mobility: Supine to Sit     Supine to sit: Min guard     General bed mobility comments: Min Guard A for safety  Transfers Overall transfer level: Needs assistance Equipment used: Rolling walker (2 wheeled) Transfers: Sit to/from Stand Sit to Stand: Min assist         General transfer comment: Min A for stability and to power up into standing. VCs for hand placement  Ambulation/Gait Ambulation/Gait assistance: Min assist   Assistive device: Rolling walker (2 wheeled) Gait Pattern/deviations: Step-through pattern Gait velocity: slower Gait velocity interpretation: <1.31  ft/sec, indicative of household ambulator General Gait Details: weak-kneed gait, guarded with short steps.  Pt did become less tentative with distance  Stairs            Wheelchair Mobility    Modified Rankin (Stroke Patients Only)       Balance Overall balance assessment: Needs assistance Sitting-balance support: No upper extremity supported;Feet supported Sitting balance-Leahy Scale: Fair     Standing balance support: Bilateral upper extremity supported;During functional activity Standing balance-Leahy Scale: Poor Standing balance comment: Reliant on UE support                             Pertinent Vitals/Pain      Home Living Family/patient expects to be discharged to:: Private residence Living Arrangements: Spouse/significant other Available Help at Discharge: Family Type of Home: House Home Access: Stairs to enter Entrance Stairs-Rails: None Entrance Stairs-Number of Steps: 3(stairs up to house as well) Home Layout: One level Home Equipment: Environmental consultant - 2 wheels;Cane - single point;Bedside commode;Grab bars - tub/shower      Prior Function Level of Independence: Independent with assistive device(s)         Comments: Using RW since hip surgery last year     Hand Dominance   Dominant Hand: Right    Extremity/Trunk Assessment             Cervical / Trunk Assessment Cervical / Trunk Assessment: Kyphotic  Communication   Communication: No difficulties;Other (comment)(Soft spoken)  Cognition Arousal/Alertness: Awake/alert Behavior During Therapy: WFL for tasks assessed/performed Overall Cognitive Status: Within Functional Limits for tasks assessed  General Comments General comments (skin integrity, edema, etc.): vss    Exercises     Assessment/Plan    PT Assessment Patient needs continued PT services  PT Problem List Decreased strength;Decreased activity tolerance;Decreased  balance;Decreased mobility;Decreased coordination       PT Treatment Interventions Gait training;Functional mobility training;DME instruction;Therapeutic activities;Patient/family education    PT Goals (Current goals can be found in the Care Plan section)  Acute Rehab PT Goals Patient Stated Goal: Return to independence PT Goal Formulation: With patient Potential to Achieve Goals: Good    Frequency Min 4X/week   Barriers to discharge        Co-evaluation PT/OT/SLP Co-Evaluation/Treatment: Yes Reason for Co-Treatment: For patient/therapist safety PT goals addressed during session: Mobility/safety with mobility         AM-PAC PT "6 Clicks" Daily Activity  Outcome Measure Difficulty turning over in bed (including adjusting bedclothes, sheets and blankets)?: Unable Difficulty moving from lying on back to sitting on the side of the bed? : Unable Difficulty sitting down on and standing up from a chair with arms (e.g., wheelchair, bedside commode, etc,.)?: Unable Help needed moving to and from a bed to chair (including a wheelchair)?: A Little Help needed walking in hospital room?: A Little Help needed climbing 3-5 steps with a railing? : A Little 6 Click Score: 12    End of Session   Activity Tolerance: Patient tolerated treatment well Patient left: in chair;with call bell/phone within reach Nurse Communication: Mobility status PT Visit Diagnosis: Unsteadiness on feet (R26.81);Muscle weakness (generalized) (M62.81)    Time: 9798-9211 PT Time Calculation (min) (ACUTE ONLY): 33 min   Charges:         PT G Codes:        2018/02/28  Donnella Sham, PT [phone]  (pager)  Tessie Fass Mister Krahenbuhl Feb 28, 2018, 3:31 PM

## 2018-02-11 NOTE — Plan of Care (Signed)
Patient stable, discussed POC with patient, agreeable w/plan, denies question/concerns at this time.  

## 2018-02-12 ENCOUNTER — Encounter (HOSPITAL_COMMUNITY): Payer: Self-pay | Admitting: Physical Medicine and Rehabilitation

## 2018-02-12 DIAGNOSIS — F329 Major depressive disorder, single episode, unspecified: Secondary | ICD-10-CM

## 2018-02-12 DIAGNOSIS — I5041 Acute combined systolic (congestive) and diastolic (congestive) heart failure: Secondary | ICD-10-CM

## 2018-02-12 DIAGNOSIS — R29898 Other symptoms and signs involving the musculoskeletal system: Secondary | ICD-10-CM

## 2018-02-12 DIAGNOSIS — F1910 Other psychoactive substance abuse, uncomplicated: Secondary | ICD-10-CM

## 2018-02-12 DIAGNOSIS — J90 Pleural effusion, not elsewhere classified: Secondary | ICD-10-CM

## 2018-02-12 DIAGNOSIS — D62 Acute posthemorrhagic anemia: Secondary | ICD-10-CM

## 2018-02-12 LAB — PHENOBARBITAL LEVEL: Phenobarbital: 17.9 ug/mL (ref 15.0–30.0)

## 2018-02-12 LAB — PHENYTOIN LEVEL, FREE AND TOTAL
Phenytoin, Free: 0.7 ug/mL — ABNORMAL LOW (ref 1.0–2.0)
Phenytoin, Total: 12.7 ug/mL (ref 10.0–20.0)

## 2018-02-12 MED ORDER — ESCITALOPRAM OXALATE 10 MG PO TABS
10.0000 mg | ORAL_TABLET | Freq: Every day | ORAL | Status: DC
  Administered 2018-02-12 – 2018-02-14 (×3): 10 mg via ORAL
  Filled 2018-02-12 (×3): qty 1

## 2018-02-12 MED ORDER — ENSURE ENLIVE PO LIQD
237.0000 mL | Freq: Two times a day (BID) | ORAL | Status: DC
  Administered 2018-02-12 – 2018-02-14 (×5): 237 mL via ORAL

## 2018-02-12 MED ORDER — ADULT MULTIVITAMIN W/MINERALS CH
1.0000 | ORAL_TABLET | Freq: Every day | ORAL | Status: DC
  Administered 2018-02-12 – 2018-02-14 (×3): 1 via ORAL
  Filled 2018-02-12 (×3): qty 1

## 2018-02-12 NOTE — Progress Notes (Signed)
Nutrition Follow-up  DOCUMENTATION CODES:   Severe malnutrition in context of chronic illness, Underweight  INTERVENTION:  Ensure Enlive po BID, each supplement provides 350 kcal and 20 grams of protein  Magic cup TID with meals, each supplement provides 290 kcal and 9 grams of protein  MVI with minerals  NUTRITION DIAGNOSIS:   Severe Malnutrition related to chronic illness as evidenced by severe muscle depletion, severe fat depletion. -ongoing  GOAL:   Patient will meet greater than or equal to 90% of their needs -progressing  MONITOR:   PO intake, Supplement acceptance  ASSESSMENT:   Pt with PMH of Sz, depression, GERD, insomnia, osteoporosis admitted 6/23 s/p seizure.    Extubated 7/6 PO 100% since having her diet advanced to regular Was eating a cottage cheese and fruit plate during visit. Normally drinks two ensure a day at home. Ordered for patient.  Labs reviewed  Medications reviewed and include:  20MeQ K+, Thiamine  Diet Order:   Diet Order           Diet regular Room service appropriate? Yes; Fluid consistency: Thin  Diet effective now          EDUCATION NEEDS:   No education needs have been identified at this time  Skin:  Skin Assessment: Reviewed RN Assessment  Last BM:  02/11/2018  Height:   Ht Readings from Last 1 Encounters:  02/05/18 5\' 2"  (1.575 m)    Weight:   Wt Readings from Last 1 Encounters:  02/12/18 81 lb 2.1 oz (36.8 kg)    Ideal Body Weight:  50 kg  BMI:  Body mass index is 14.84 kg/m.  Estimated Nutritional Needs:   Kcal:  1450-1650 calories  Protein:  65-80 grams  Fluid:  > 1.5 L/day    Samantha Anis. Lively Haberman, MS, RD LDN Inpatient Clinical Dietitian Pager [phone]

## 2018-02-12 NOTE — Progress Notes (Signed)
[facility] P[facility]  S: complains of anxiety and depression, hasn't been out of bed yet, has a cough   O: Vitals:   02/11/18 2044 02/11/18 2350 02/12/18 0411 02/12/18 0822  BP: 113/70 100/60 96/65 112/66  Pulse: 92 82 82 85  Resp: 15 16 13 20   Temp: 98.6 F (37 C)  98.1 F (36.7 C) 97.8 F (36.6 C)  TempSrc: Oral  Oral Oral  SpO2: 98% 93% 100% 100%  Weight:   81 lb 2.1 oz (36.8 kg)   Height:       RA  General:  Generally very weak, but resting comfortably in bed HENT: NCAT OP clear PULM: CTA B, normal effort CV: RRR, no mgr GI: BS+, soft, nontender MSK: normal bulk and tone Neuro: awake, alert, no distress, MAEW   CT abdomen from 7/6 personally reviewed: minimal effusions in bases of lungs bialterally, no ascites, hemangioma R liver lobe, chronic appearing compression fractures L1-3  CBC    Component Value Date/Time   WBC 6.3 02/11/2018 0426   RBC 3.10 (L) 02/11/2018 0426   HGB 10.5 (L) 02/11/2018 0426   HCT 32.1 (L) 02/11/2018 0426   PLT 329 02/11/2018 0426   MCV 103.5 (H) 02/11/2018 0426   MCH 33.9 02/11/2018 0426   MCHC 32.7 02/11/2018 0426   RDW 11.8 02/11/2018 0426   LYMPHSABS 1.1 02/11/2018 0426   MONOABS 0.5 02/11/2018 0426   EOSABS 0.2 02/11/2018 0426   BASOSABS 0.0 02/11/2018 0426   BMET    Component Value Date/Time   NA 135 02/11/2018 0426   K 4.4 02/11/2018 0426   CL 98 02/11/2018 0426   CO2 28 02/11/2018 0426   GLUCOSE 90 02/11/2018 0426   BUN 19 02/11/2018 0426   CREATININE 0.60 02/11/2018 0426   CALCIUM 9.3 02/11/2018 0426   GFRNONAA >60 02/11/2018 0426   GFRAA >60 02/11/2018 0426    Impression/Plan:  Seizure disorder/status epilepticus: continue kappra, phenobarb, lyrica per neuro Anxiety/depression: ativan prn, lyrica. Restart lexapro GERD/dyspepsia: continue PPI Physical deconditioning: CIR consult  Transfer to [place], MD [place] PCCM Pager: [phone] Cell: [phone] After 3pm or if no response, call [phone]

## 2018-02-12 NOTE — Plan of Care (Signed)
Patient stable, tolerating ambulation well. Discussed POC with patient, agreeable with plan, denies question/concerns at this time.

## 2018-02-12 NOTE — Progress Notes (Signed)
Physical Therapy Treatment Patient Details Name: Samantha Atkins MRN: [id number] DOB: [birth year] Today's Date: 02/12/2018    History of Present Illness 67 yo female with past medical history of Anxiety, Arthritis, Depression, Fracture of humerus, proximal, left, closed (08/07/2015), Fracture, humerus closed (07-31-15 fell), GERD (gastroesophageal reflux disease), Headache, Insomnia, Migraines, and Osteoporosis. Pt presenting to ED with seizures on 6/23. Intubated on 6/23; extubated 7/5. MRI of brain negative for acute findings and moderate brain atrophy.    PT Comments    Patient is making progress toward PT goals. Pt demonstrates generalized weakness and will continue to benefit from further skilled PT services to maximize independence and safety with mobility.    Follow Up Recommendations  CIR     Equipment Recommendations  None recommended by PT    Recommendations for Other Services Rehab consult     Precautions / Restrictions Precautions Precautions: Fall;Other (comment)(Seizures) Restrictions Weight Bearing Restrictions: No    Mobility  Bed Mobility Overal bed mobility: Needs Assistance Bed Mobility: Supine to Sit     Supine to sit: Supervision     General bed mobility comments: supervision for safety  Transfers Overall transfer level: Needs assistance Equipment used: Rolling walker (2 wheeled) Transfers: Sit to/from Stand Sit to Stand: Min guard         General transfer comment: min guard for safety; cues for safe hand placement  Ambulation/Gait Ambulation/Gait assistance: Min guard Gait Distance (Feet): 200 Feet Assistive device: Rolling walker (2 wheeled) Gait Pattern/deviations: Step-through pattern;Decreased stride length;Trunk flexed;Narrow base of support Gait velocity: decreased    General Gait Details: cues for posture and increased stride length   Stairs Stairs: Yes Stairs assistance: Min assist Stair Management: Two rails;Alternating  pattern;Forwards Number of Stairs: 2 General stair comments: cues for sequencing   Wheelchair Mobility    Modified Rankin (Stroke Patients Only)       Balance Overall balance assessment: Needs assistance Sitting-balance support: No upper extremity supported;Feet supported Sitting balance-Leahy Scale: Fair       Standing balance-Leahy Scale: Fair Standing balance comment: pt able to static stand without UE support for hand hygiene                            Cognition Arousal/Alertness: Awake/alert Behavior During Therapy: WFL for tasks assessed/performed Overall Cognitive Status: Within Functional Limits for tasks assessed                                        Exercises      General Comments        Pertinent Vitals/Pain      Home Living                      Prior Function            PT Goals (current goals can now be found in the care plan section) Acute Rehab PT Goals Patient Stated Goal: Return to independence PT Goal Formulation: With patient Potential to Achieve Goals: Good Progress towards PT goals: Progressing toward goals    Frequency    Min 4X/week      PT Plan Current plan remains appropriate    Co-evaluation              AM-PAC PT "6 Clicks" Daily Activity  Outcome Measure  Difficulty turning over in bed (including  adjusting bedclothes, sheets and blankets)?: A Little Difficulty moving from lying on back to sitting on the side of the bed? : Unable Difficulty sitting down on and standing up from a chair with arms (e.g., wheelchair, bedside commode, etc,.)?: Unable Help needed moving to and from a bed to chair (including a wheelchair)?: A Little Help needed walking in hospital room?: A Little Help needed climbing 3-5 steps with a railing? : A Little 6 Click Score: 14    End of Session Equipment Utilized During Treatment: Gait belt Activity Tolerance: Patient tolerated treatment  well Patient left: in chair;with call bell/phone within reach;with chair alarm set Nurse Communication: Mobility status PT Visit Diagnosis: Unsteadiness on feet (R26.81);Muscle weakness (generalized) (M62.81)     Time: 3557-3220 PT Time Calculation (min) (ACUTE ONLY): 25 min  Charges:  $Gait Training: 8-22 mins $Therapeutic Activity: 8-22 mins                    G Codes:       Earney Navy, PTA Pager: [phone]     Darliss Cheney 02/12/2018, 3:16 PM

## 2018-02-12 NOTE — Progress Notes (Signed)
Inpatient Rehabilitation  Patient was re-screened by Gunnar Fusi for appropriateness for an Inpatient Acute Rehab consult with PT evaluation.  Note that patient did well on eval requiring Min A and might recover quickly.  At this time we are planning to follow for updated therapy notes today to determine progress and most appropriate post acute rehab venue prior to requesting an Inpatient Rehab consult.  Call if questions.   Carmelia Roller., CCC/SLP Admission Coordinator  [place]  Cell [phone]

## 2018-02-12 NOTE — Consult Note (Signed)
Physical Medicine and Rehabilitation Consult   Reason for Consult: Seizure with functional decline.  Referring Physician: Dr. Lake Bells.    HPI: Samantha Atkins is a 67 y.o. female with history of HA, major depressive disorder, seizure disorder who was admitted on 01/29/18 after found unresponsive with difficulty breathing and foaming at the mouth.  History taken from chart review and patient.  She had recurrent seizure activity in ED with right Todd's paralysis and was treated with ativan and IV Keppra.  MRI brain reviewed, unremarkable for acute intracranial process. UDS positive for benos and THC.  Infectious work up negative. She was placed on LTM-EEG and PLEDs noted therefore phenobarb, Lyrica and dilantin added.  She had difficulty with vent wean and CT chest done revealing large right > left pleural effusions. 2 D echo showed EF 50-55% with no wall abnormality and CT abdomen was negative for ascites.  She was started on IV lasix for pleural effusions and tolerated extubation by 7/5.  Dilantin discontinued 7/5 due to sustained nystagmus on end gaze. Keppra being tapered.  Therapy evaluations done revealing deficits in mobility and self care tasks. CIR recommended   Review of Systems  Constitutional: Positive for malaise/fatigue.  HENT: Positive for hearing loss. Negative for tinnitus.   Eyes: Negative for blurred vision and double vision.  Respiratory: Negative for cough and shortness of breath.   Cardiovascular: Positive for chest pain (left chest wall pain since admission).  Gastrointestinal: Positive for constipation, diarrhea and heartburn. Negative for nausea.  Genitourinary: Positive for dysuria, frequency and urgency.  Musculoskeletal: Positive for falls (passes out without warning. ).  Neurological: Positive for weakness and headaches. Negative for dizziness and tingling.  Psychiatric/Behavioral: The patient is nervous/anxious.   All other systems reviewed and are  negative.   Past Medical History:  Diagnosis Date  . Anxiety   . Arthritis    hands  . Depression   . Fracture of humerus, proximal, left, closed 08/07/2015  . Fracture, humerus closed 07-31-15 fell   left  . GERD (gastroesophageal reflux disease)   . Headache   . Insomnia   . Migraines   . Osteoporosis     Past Surgical History:  Procedure Laterality Date  . ABDOMINAL HYSTERECTOMY    . FEMUR FRACTURE SURGERY Left    fell, redo hip replacement  . JOINT REPLACEMENT Bilateral    hip  . ORIF DISTAL RADIUS FRACTURE Left 03-2015  . ORIF HUMERUS FRACTURE Left 08/07/2015   Procedure: LEFT OPEN REDUCTION INTERNAL FIXATION (ORIF) PROXIMAL HUMERUS FRACTURE;  Surgeon: Marchia Bond, MD;  Location: [facility];  Service: Orthopedics;  Laterality: Left;  . TONSILLECTOMY    . TOTAL HIP REVISION Left 02/14/2017   Procedure: LEFT ORIF PERIPROSTHETIC FEMORAL FRACTURE AND REVISION HIP ARTHROPLASTY;  Surgeon: Paralee Cancel, MD;  Location: [facility];  Service: Orthopedics;  Laterality: Left;    Family History  Problem Relation Age of Onset  . Hip fracture Mother   . Heart Problems Father     Social History:  Married. Husband works--no local family. He can work from home.  Moved from [place] 2 1/2  years ago.  Independent with RW prior to admission.  She reports that she has never smoked. She has never used smokeless tobacco. She reports that she does not drink alcohol or use drugs.     Allergies  Allergen Reactions  . Dust Mite Extract Cough    Medications Prior to Admission  Medication Sig Dispense Refill  .  butalbital-acetaminophen-caffeine (FIORICET, ESGIC) 50-325-40 MG tablet Take 1 tablet by mouth every 6 (six) hours as needed for migraine.     . calcium citrate-vitamin D (CITRACAL+D) 315-200 MG-UNIT tablet Take 1 tablet by mouth 2 (two) times daily.    . cholecalciferol (VITAMIN D) 1000 units tablet Take 1,000 Units by mouth daily.    Marland Kitchen escitalopram (LEXAPRO) 10 MG  tablet Take 10 mg by mouth daily.    . feeding supplement (BOOST HIGH PROTEIN) LIQD Take 1 Container by mouth daily.    . ferrous sulfate 325 (65 FE) MG EC tablet Take 1 tablet (325 mg total) by mouth every other day. (Patient taking differently: Take 325 mg by mouth daily with breakfast. ) 60 tablet 0  . HYDROcodone-acetaminophen (NORCO) 5-325 MG tablet Take 1-2 tablets by mouth every 6 (six) hours as needed. (Patient taking differently: Take 1-2 tablets by mouth every 6 (six) hours as needed for moderate pain. ) 20 tablet 0  . levETIRAcetam (KEPPRA) 500 MG tablet Take 1 tablet (500 mg total) by mouth 2 (two) times daily. 60 tablet 0  . methocarbamol (ROBAXIN) 500 MG tablet Take 1 tablet (500 mg total) by mouth every 6 (six) hours as needed for muscle spasms. 30 tablet 0  . Multiple Vitamin (MULTIVITAMIN WITH MINERALS) TABS tablet Take 1 tablet by mouth daily.    Marland Kitchen omeprazole (PRILOSEC) 20 MG capsule Take 20 mg by mouth daily.    . potassium chloride SA (K-DUR,KLOR-CON) 20 MEQ tablet Take 20 mEq by mouth daily.    . psyllium (HYDROCIL/METAMUCIL) 95 % PACK Take 1 packet by mouth daily.    Marland Kitchen senna-docusate (SENOKOT-S) 8.6-50 MG tablet Take 1 tablet by mouth at bedtime. 30 tablet 0  . sertraline (ZOLOFT) 100 MG tablet Take 100 mg by mouth daily.    Marland Kitchen zinc gluconate 50 MG tablet Take 50 mg by mouth daily.      Home: Home Living Family/patient expects to be discharged to:: Private residence Living Arrangements: Spouse/significant other Available Help at Discharge: Family Type of Home: House Home Access: Stairs to enter [employer] of Steps: 3(stairs up to house as well) Entrance Stairs-Rails: None Home Layout: One level Bathroom Shower/Tub: Tub/shower unit, Architectural technologist: Standard Home Equipment: Environmental consultant - 2 wheels, Cane - single point, Bedside commode, Grab bars - tub/shower  Functional History: Prior Function Level of Independence: Independent with assistive  device(s) Comments: Using RW since hip surgery last year Functional Status:  Mobility: Bed Mobility Overal bed mobility: Needs Assistance Bed Mobility: Supine to Sit Supine to sit: Min guard General bed mobility comments: Min Guard A for safety Transfers Overall transfer level: Needs assistance Equipment used: Rolling walker (2 wheeled) Transfers: Sit to/from Stand Sit to Stand: Min assist General transfer comment: Min A for stability and to power up into standing. VCs for hand placement Ambulation/Gait Ambulation/Gait assistance: Min assist Gait Distance (Feet): (P) 200 Feet Assistive device: Rolling walker (2 wheeled) Gait Pattern/deviations: Step-through pattern General Gait Details: weak-kneed gait, guarded with short steps.  Pt did become less tentative with distance Gait velocity: slower Gait velocity interpretation: <1.31 ft/sec, indicative of household ambulator    ADL: ADL Overall ADL's : Needs assistance/impaired Eating/Feeding: Set up, Supervision/ safety, Sitting Grooming: Minimal assistance, Standing Upper Body Bathing: Minimal assistance, Sitting Lower Body Bathing: Minimal assistance, Sit to/from stand Upper Body Dressing : Minimal assistance, Sitting Upper Body Dressing Details (indicate cue type and reason): Donning new gown Lower Body Dressing: Minimal assistance, Sit to/from stand Lower Body  Dressing Details (indicate cue type and reason): Pt donning socks at EOB with signfiicant amount of time and effort. Min A for standing balance Toilet Transfer: Minimal assistance, Ambulation, Regular Toilet, Grab bars, RW Toilet Transfer Details (indicate cue type and reason): Min A for safe descent and to power up into standing.  Toileting- Clothing Manipulation and Hygiene: Minimal assistance, Sit to/from stand Toileting - Clothing Manipulation Details (indicate cue type and reason): pt performing toilet hygiene. Min A for clothing management and then to make sure pt  was clean after BM Functional mobility during ADLs: Minimal assistance, Rolling walker General ADL Comments: Pt with decreased strength, balance, and activity tolerance. Pt highly motivated to participate in therapy and return to PLOF  Cognition: Cognition Overall Cognitive Status: Within Functional Limits for tasks assessed Orientation Level: Oriented X4 Cognition Arousal/Alertness: Awake/alert Behavior During Therapy: WFL for tasks assessed/performed Overall Cognitive Status: Within Functional Limits for tasks assessed   Blood pressure 112/66, pulse 85, temperature 97.8 F (36.6 C), temperature source Oral, resp. rate 20, height 5\' 2"  (1.575 m), weight 36.8 kg (81 lb 2.1 oz), SpO2 100 %. Physical Exam  Nursing note and vitals reviewed. Constitutional: She is oriented to person, place, and time. She appears well-developed. She appears cachectic.  HENT:  Head: Normocephalic and atraumatic.  Eyes: EOM are normal. Right eye exhibits no discharge. Left eye exhibits no discharge.  Neck: Normal range of motion. Neck supple.  Cardiovascular: Normal rate and regular rhythm.  Respiratory: Effort normal and breath sounds normal.  GI: Soft. Bowel sounds are normal.  Musculoskeletal:  No edema or tenderness in extremities  Neurological: She is alert and oriented to person, place, and time.  Motor: 4+/5 grossly throughout Sensation intact to light touch  Skin: Skin is warm and dry.  Psychiatric: She has a normal mood and affect. Her behavior is normal.    Results for orders placed or performed during the hospital encounter of 01/28/18 (from the past 24 hour(s))  Glucose, capillary     Status: Abnormal   Collection Time: 02/11/18 12:05 PM  Result Value Ref Range   Glucose-Capillary 114 (H) 70 - 99 mg/dL   Comment 1 Notify RN    Comment 2 Document in Chart   Glucose, capillary     Status: None   Collection Time: 02/11/18  4:23 PM  Result Value Ref Range   Glucose-Capillary 99 70 - 99  mg/dL   Comment 1 Notify RN    Comment 2 Document in Chart   Glucose, capillary     Status: None   Collection Time: 02/11/18  9:28 PM  Result Value Ref Range   Glucose-Capillary 97 70 - 99 mg/dL   Ct Abdomen Pelvis W Contrast  Result Date: 02/10/2018 CLINICAL DATA:  Ascites. No ETOH or significant liver disease. Ovarian/fallopian/peritoneal cancer suspected. EXAM: CT ABDOMEN AND PELVIS WITH CONTRAST TECHNIQUE: Multidetector CT imaging of the abdomen and pelvis was performed using the standard protocol following bolus administration of intravenous contrast. CONTRAST:  143mL OMNIPAQUE IOHEXOL 300 MG/ML  SOLN COMPARISON:  Chest CT dated 02/05/2018. FINDINGS: Lower chest: Small bilateral pleural effusions with adjacent compressive atelectasis. The pleural effusions have decreased in size compared to earlier chest CT of 02/05/2018. Hepatobiliary: Subtle hypodense lesion within the RIGHT liver lobe, corresponding to a benign hemangioma per earlier MRI abdomen of 04/07/2017. No other focal liver abnormality is seen. Gallbladder appears normal. No bile duct dilatation. Pancreas: Difficult to definitively characterize due to mild patient motion artifact, but grossly normal. No  peripancreatic fluid seen. Spleen: Normal in size without focal abnormality. Adrenals/Urinary Tract: Adrenal glands appear normal. Kidneys are unremarkable without mass, stone or hydronephrosis. No ureteral or bladder calculi identified. Bladder decompressed by Foley catheter. Stomach/Bowel: No dilated small bowel loops. Rectosigmoid colon moderately distended with stool and oral contrast material. Pelvic portion of the bowel otherwise difficult to characterize due to obscuring metallic artifact emanating from patient's bilateral hip prostheses. No convincing evidence of bowel wall thickening or bowel wall inflammation within the visualized portions of the abdomen and pelvis. Vascular/Lymphatic: Aortic atherosclerosis. No enlarged abdominal or  pelvic lymph nodes. Reproductive: Status post hysterectomy. No obvious adnexal mass or free fluid, however, the adnexal regions are partially obscured by metallic artifact emanating from patient's bilateral hip prostheses. Other: The ascites appreciated within the upper abdomen on earlier chest CT of 02/05/2018 is not present on today's exam. There is no ascites appreciated on today's study. There could be of small amount of obscured ascites within the lower pelvis, as detailed above. No evidence of abscess. No free intraperitoneal air seen. No evidence of omental caking seen. No evidence of peritoneal or mesenteric implants. Musculoskeletal: Chronic appearing compression deformities of the L1 through L3 vertebral bodies, likely accentuated by chronic degenerative Schmorl's nodes. Additional chronic compression fracture deformity at the T8 vertebral body level, as also seen on earlier chest CT. No acute appearing osseous abnormality. No evidence of osseous metastasis seen. IMPRESSION: 1. No appreciable ascites on today's exam. The ascites seen within the upper abdomen on earlier chest CT of 02/05/2018 is no longer present. As above, there could be a small amount of ascites within the lower pelvis which is obscured by metallic artifact emanating from patient's bilateral hip prostheses. 2. Overall, no acute findings within the abdomen or pelvis. No omental caking, mesenteric or peritoneal implants, or other evidence of neoplastic process. Again, the lower pelvis is incompletely visualized due to obscuring metallic artifact emanating from patient's bilateral hip prostheses. 3. Small bilateral pleural effusions with adjacent compressive atelectasis. The pleural effusions have decreased in size compared to the earlier chest CT. 4. Benign hemangioma within the RIGHT liver lobe, more definitively characterized on earlier abdomen MRI. 5. Aortic atherosclerosis. 6. Chronic appearing compression fracture deformities of the L1  through L3 vertebral bodies, 50-70% compression anteriorly at each level, accentuated by chronic degenerative Schmorl's nodes. No acute or suspicious osseous findings. Electronically Signed   By: Franki Cabot M.D.   On: 02/10/2018 14:53    Assessment/Plan: Diagnosis: Seizures Labs and images (see above) independently reviewed.  Records reviewed and summated above.  1. Does the need for close, 24 hr/day medical supervision in concert with the patient's rehab needs make it unreasonable for this patient to be served in a less intensive setting? Potentially  2. Co-Morbidities requiring supervision/potential complications: combined CHF (Monitor in accordance with increased physical activity and avoid UE resistance excercises), pleural effusions, polysubstance abuse (counsel), HA, major depressive disorder (ensure mood does not hinder progress of therapies), seizure (meds per Neuro), ABLA (transfuse if necessary to ensure appropriate perfusion for increased activity tolerance) 3. Due to safety, disease management and patient education, does the patient require 24 hr/day rehab nursing? Potentially 4. Does the patient require coordinated care of a physician, rehab nurse, PT (1-2 hrs/day, 5 days/week) and OT (1-2 hrs/day, 5 days/week) to address physical and functional deficits in the context of the above medical diagnosis(es)? Potentially Addressing deficits in the following areas: balance, endurance, locomotion, strength, transferring, bathing, dressing, toileting and psychosocial support 5.  Can the patient actively participate in an intensive therapy program of at least 3 hrs of therapy per day at least 5 days per week? Yes 6. The potential for patient to make measurable gains while on inpatient rehab is excellent 7. Anticipated functional outcomes upon discharge from inpatient rehab are modified independent and supervision  with PT, modified independent and supervision with OT, n/a with SLP. 8. Estimated  rehab length of stay to reach the above functional goals is: 5-9 days. 9. Anticipated D/C setting: Home 10. Anticipated post D/C treatments: HH therapy and Home excercise program 11. Overall Rehab/Functional Prognosis: good  RECOMMENDATIONS: This patient's condition is appropriate for continued rehabilitative care in the following setting: CIR if functional deficits persist Patient has agreed to participate in recommended program. Yes Note that insurance prior authorization may be required for reimbursement for recommended care.  Comment: Rehab Admissions Coordinator to follow up.   I have personally performed a face to face diagnostic evaluation, including, but not limited to relevant history and physical exam findings, of this patient and developed relevant assessment and plan.  Additionally, I have reviewed and concur with the physician assistant's documentation above.   Delice Lesch, MD, ABPMR Bary Leriche, PA-C 02/12/2018

## 2018-02-12 NOTE — Progress Notes (Signed)
Occupational Therapy Treatment Patient Details Name: Samantha Atkins MRN: [id number] DOB: [birth year] Today's Date: 02/12/2018    History of present illness 67 yo female with past medical history of Anxiety, Arthritis, Depression, Fracture of humerus, proximal, left, closed (08/07/2015), Fracture, humerus closed (07-31-15 fell), GERD (gastroesophageal reflux disease), Headache, Insomnia, Migraines, and Osteoporosis. Pt presenting to ED with seizures on 6/23. Intubated on 6/23; extubated 7/5. MRI of brain negative for acute findings and moderate brain atrophy.   OT comments  Pt progressing towards OT goals, presents supine in bed pleasant and motivated to work with therapy this session. Pt demonstrating room and hallway level functional mobility using RW with overall minA; completed toileting and standing grooming ADLs during session with minA for toileting task and minguard assist for static standing balance during grooming ADLs. Pt fatigued with activity but is motivated to work and progress towards PLOF. Feel CIR recommendation remains appropriate at this time. Will continue to follow acutely to progress pt towards established OT goals.   Follow Up Recommendations  CIR;Supervision/Assistance - 24 hour    Equipment Recommendations  Other (comment)(to be further assessed in next venue )          Precautions / Restrictions Precautions Precautions: Fall;Other (comment)(seizures ) Restrictions Weight Bearing Restrictions: No       Mobility Bed Mobility Overal bed mobility: Needs Assistance Bed Mobility: Supine to Sit;Sit to Supine     Supine to sit: Supervision Sit to supine: Supervision   General bed mobility comments: supervision for safety  Transfers Overall transfer level: Needs assistance Equipment used: Rolling walker (2 wheeled) Transfers: Sit to/from Stand Sit to Stand: Min assist         General transfer comment: assist to rise and steady at [place]; stood from EOB and toilet      Balance Overall balance assessment: Needs assistance Sitting-balance support: No upper extremity supported;Feet supported Sitting balance-Leahy Scale: Fair     Standing balance support: Bilateral upper extremity supported;During functional activity Standing balance-Leahy Scale: Fair Standing balance comment: pt able to static stand without UE support for grooming ADLs, minguard assist for safety                            ADL either performed or assessed with clinical judgement   ADL Overall ADL's : Needs assistance/impaired     Grooming: Min guard;Standing;Oral care;Wash/dry face;Wash/dry hands Grooming Details (indicate cue type and reason): minguard for standing balance              Lower Body Dressing: Minimal assistance;Sit to/from stand Lower Body Dressing Details (indicate cue type and reason): pt donning/doffing R sock seated EOB; requires assist to don/doff L sock Toilet Transfer: Minimal assistance;Ambulation;Regular Toilet;Grab bars;RW [employer] technical [employer] Details (indicate cue type and reason): Min A for safe descent and to power up into standing.  Toileting- Clothing Manipulation and Hygiene: Minimal assistance;Sit to/from stand Toileting - Clothing Manipulation Details (indicate cue type and reason): minA for gown management      Functional mobility during ADLs: Minimal assistance;Rolling walker General ADL Comments: pt completing toileting, standing grooming ADLs and ambulating in hallway using RW with minA; fatigue noted but pt motivated to work and progress with therapy                        Cognition Arousal/Alertness: Awake/alert Behavior During Therapy: WFL for tasks assessed/performed Overall Cognitive Status: Within Functional Limits for tasks assessed  Pertinent Vitals/ Pain       Pain Assessment: Faces Faces Pain Scale: Hurts a little bit Pain  Location: neck  Pain Descriptors / Indicators: Sore Pain Intervention(s): Monitored during session                                                          Frequency  Min 2X/week        Progress Toward Goals  OT Goals(current goals can now be found in the care plan section)  Progress towards OT goals: Progressing toward goals  Acute Rehab OT Goals Patient Stated Goal: Return to independence OT Goal Formulation: With patient Time For Goal Achievement: 02/24/18 Potential to Achieve Goals: Good  Plan Discharge plan remains appropriate    Co-evaluation                 AM-PAC PT "6 Clicks" Daily Activity     Outcome Measure   Help from another person eating meals?: None Help from another person taking care of personal grooming?: A Little Help from another person toileting, which includes using toliet, bedpan, or urinal?: A Little Help from another person bathing (including washing, rinsing, drying)?: A Little Help from another person to put on and taking off regular upper body clothing?: A Little Help from another person to put on and taking off regular lower body clothing?: A Little 6 Click Score: 19    End of Session Equipment Utilized During Treatment: Rolling walker;Gait belt  OT Visit Diagnosis: Unsteadiness on feet (R26.81);Other abnormalities of gait and mobility (R26.89);Muscle weakness (generalized) (M62.81)   Activity Tolerance Patient tolerated treatment well   Patient Left in bed;with call bell/phone within reach;with bed alarm set   Nurse Communication Mobility status        Time: 4599-7741 OT Time Calculation (min): 27 min  Charges: OT General Charges $OT Visit: 1 Visit OT Treatments $Self Care/Home Management : 23-37 mins  Lou Cal, OT Pager [phone] 02/12/2018    [name] 02/12/2018, 3:34 PM

## 2018-02-13 ENCOUNTER — Encounter (HOSPITAL_COMMUNITY): Payer: Self-pay | Admitting: General Practice

## 2018-02-13 ENCOUNTER — Other Ambulatory Visit: Payer: Self-pay

## 2018-02-13 LAB — BASIC METABOLIC PANEL
ANION GAP: 11 (ref 5–15)
BUN: 19 mg/dL (ref 8–23)
CALCIUM: 9.9 mg/dL (ref 8.9–10.3)
CO2: 28 mmol/L (ref 22–32)
Chloride: 97 mmol/L — ABNORMAL LOW (ref 98–111)
Creatinine, Ser: 0.51 mg/dL (ref 0.44–1.00)
Glucose, Bld: 95 mg/dL (ref 70–99)
POTASSIUM: 4.5 mmol/L (ref 3.5–5.1)
Sodium: 136 mmol/L (ref 135–145)

## 2018-02-13 MED ORDER — BUTALBITAL-APAP-CAFFEINE 50-325-40 MG PO TABS
1.0000 | ORAL_TABLET | ORAL | Status: DC | PRN
  Administered 2018-02-13 – 2018-02-14 (×6): 1 via ORAL
  Filled 2018-02-13 (×6): qty 1

## 2018-02-13 MED ORDER — PREGABALIN 150 MG PO CAPS: 150.0000 mg | ORAL_CAPSULE | Freq: Three times a day (TID) | ORAL | 0 refills | Status: DC

## 2018-02-13 MED ORDER — DIPHENHYDRAMINE HCL 50 MG/ML IJ SOLN
12.5000 mg | Freq: Once | INTRAMUSCULAR | Status: AC
  Administered 2018-02-14: 12.5 mg via INTRAVENOUS
  Filled 2018-02-13: qty 1

## 2018-02-13 MED ORDER — HEPARIN SODIUM (PORCINE) 5000 UNIT/ML IJ SOLN: 5000.0000 [IU] | Freq: Three times a day (TID) | INTRAMUSCULAR | Status: DC

## 2018-02-13 MED ORDER — LORAZEPAM 1 MG PO TABS: 1.0000 mg | ORAL_TABLET | ORAL | 0 refills | Status: AC | PRN

## 2018-02-13 MED ORDER — PHENOBARBITAL SODIUM 65 MG/ML IJ SOLN: 60.0000 mg | Freq: Two times a day (BID) | INTRAMUSCULAR | 1 refills | Status: DC

## 2018-02-13 NOTE — Discharge Summary (Signed)
Physician Discharge Summary  Samantha Atkins MMC:[id number] DOB: 01-31-1951 DOA: 01/28/2018  PCP: Dineen Kid, MD  Admit date: 01/28/2018 Discharge date: 02/13/2018  Admitted From: Home Disposition: CIR  Recommendations for Outpatient Follow-up:  1. Follow up with PCP in 1-2 weeks 2. Please obtain BMP/CBC in one week 3. Follow-up with neurology Dr. Cheral Marker  Home Health: None Equipment/Devices none  Discharge Condition: Stable CODE STAtus full code Diet recommendation: Cardiac  Brief/Interim Summary: This is 67 year old female admitted 01/28/2018 to ICU with status epilepticus with a history of seizure disorder.  Patient was taking Keppra 500 twice daily and was compliant to his medications at home.  She was intubated for airway protection.  Extubated 02/09/2018.  Patient remains seizure-free since extubation.  Was [facility].  She will be discharged to CIR as soon as bed is available.  Discharge Diagnoses:  Active Problems:   Seizures ([place])   Status epilepticus (HCC)   Hypotension   Acute respiratory failure with hypoxia (HCC)   Endotracheally intubated   Chronic bilateral pleural effusions   Acute combined systolic and diastolic CHF, NYHA class 1 (HCC)   Polysubstance abuse ([place])   Acute blood loss anemia  1] status epilepticus in a patient with history of seizure disorder-patient followed by neurology during the hospital stay.  Continue Keppra, Lyrica phenobarbital.  2] microcytic anemia without any history of alcohol abuse.  Normal B12 and folate.  Question myelodysplastic syndrome.  Patient will need follow-up with the hematologist.  3] bilateral pleural effusions and ascites of unclear etiology treated with diuretics in the ICU.  Normal LFTs.  Follow-up. Discharge Instructions   Allergies as of 02/13/2018      Reactions   Dust Mite Extract Cough      Medication List    STOP taking these medications   sertraline 100 MG tablet Commonly known as:  ZOLOFT      TAKE these medications   butalbital-acetaminophen-caffeine 50-325-40 MG tablet Commonly known as:  FIORICET, ESGIC Take 1 tablet by mouth every 6 (six) hours as needed for migraine.   calcium citrate-vitamin D 315-200 MG-UNIT tablet Commonly known as:  CITRACAL+D Take 1 tablet by mouth 2 (two) times daily.   cholecalciferol 1000 units tablet Commonly known as:  VITAMIN D Take 1,000 Units by mouth daily.   escitalopram 10 MG tablet Commonly known as:  LEXAPRO Take 10 mg by mouth daily.   feeding supplement Liqd Take 1 Container by mouth daily.   ferrous sulfate 325 (65 FE) MG EC tablet Take 1 tablet (325 mg total) by mouth every other day. What changed:  when to take this   heparin 5000 UNIT/ML injection Inject 1 mL (5,000 Units total) into the skin every 8 (eight) hours.   HYDROcodone-acetaminophen 5-325 MG tablet Commonly known as:  NORCO Take 1-2 tablets by mouth every 6 (six) hours as needed. What changed:  reasons to take this   levETIRAcetam 500 MG tablet Commonly known as:  KEPPRA Take 1 tablet (500 mg total) by mouth 2 (two) times daily.   LORazepam 1 MG tablet Commonly known as:  ATIVAN Take 1 tablet (1 mg total) by mouth every 4 (four) hours as needed for anxiety.   methocarbamol 500 MG tablet Commonly known as:  ROBAXIN Take 1 tablet (500 mg total) by mouth every 6 (six) hours as needed for muscle spasms.   multivitamin with minerals Tabs tablet Take 1 tablet by mouth daily.   omeprazole 20 MG capsule Commonly known as:  PRILOSEC  Take 20 mg by mouth daily.   PHENObarbital 65 MG/ML injection Commonly known as:  LUMINAL Inject 0.92 mLs (60 mg total) into the vein 2 (two) times daily.   potassium chloride SA 20 MEQ tablet Commonly known as:  K-DUR,KLOR-CON Take 20 mEq by mouth daily.   pregabalin 150 MG capsule Commonly known as:  LYRICA Place 1 capsule (150 mg total) into feeding tube every 8 (eight) hours.   psyllium 95 % Pack Commonly known  as:  HYDROCIL/METAMUCIL Take 1 packet by mouth daily.   senna-docusate 8.6-50 MG tablet Commonly known as:  Senokot-S Take 1 tablet by mouth at bedtime.   zinc gluconate 50 MG tablet Take 50 mg by mouth daily.      Follow-up Information    Via, Lennette Bihari, MD.   Specialty:  [facility] Contact information: [place] [address] [phone]          Allergies  Allergen Reactions  . Dust Mite Extract Cough    Consultations:  pccm,neuro   Procedures/Studies: Ct Head Wo Contrast  Result Date: 01/29/2018 CLINICAL DATA:  Unresponsive.  Possible seizure. EXAM: CT HEAD WITHOUT CONTRAST TECHNIQUE: Contiguous axial images were obtained from the base of the skull through the vertex without intravenous contrast. COMPARISON:  Brain MR dated 02/15/2017 and head CT dated 02/13/2017. FINDINGS: Brain: Diffusely enlarged ventricles and subarachnoid spaces. Patchy white matter low density in both cerebral hemispheres. No intracranial hemorrhage, mass lesion or CT evidence of acute infarction. Vascular: No hyperdense vessel or unexpected calcification. Skull: Normal. Negative for fracture or focal lesion. Sinuses/Orbits: Unremarkable. Other: Bilateral temporomandibular joint degenerative changes. IMPRESSION: 1. No acute abnormality. 2. Stable diffuse cerebral and cerebellar atrophy. 3. Stable chronic small vessel white matter ischemic changes in both cerebral hemispheres. Electronically Signed   By: Claudie Revering M.D.   On: 01/29/2018 00:04   Ct Chest Wo Contrast  Result Date: 02/05/2018 CLINICAL DATA:  Acute respiratory failure with hypoxia. Pleural effusions. EXAM: CT CHEST WITHOUT CONTRAST TECHNIQUE: Multidetector CT imaging of the chest was performed following the standard protocol without IV contrast. COMPARISON:  Ultrasound dated 02/03/2018 and chest x-rays dated 02/03/2018 and 02/01/2018 and MRI of the abdomen dated 04/07/2017 FINDINGS: Cardiovascular: Heart size is  normal. Aortic atherosclerosis. Coronary artery calcifications. Mediastinum/Nodes: No enlarged mediastinal or axillary lymph nodes. Thyroid gland, trachea, and esophagus demonstrate no significant findings. NG tube in the stomach. Endotracheal tube tip 2 cm above the carina. Lungs/Pleura: Large bilateral pleural effusions with complete atelectasis of the right lower lobe and partial atelectasis of left lower lobe. The pulmonary vascularity is normal. No infiltrates. Upper Abdomen: Extensive ascites. Musculoskeletal: There are slight compression fractures of the superior endplates of T4 and T6 with a more extensive compression fracture of T8, unchanged since 2018. Prominent Schmorl's node in the superior endplate of E26. anasarca. IMPRESSION: 1. Large bilateral pleural effusions, slightly greater on the right than the left. 2. Complete compressive atelectasis of the right lower lobe. Partial compressive atelectasis of the left lower lobe. 3. No infiltrates.  Pulmonary vascularity and heart size are normal. 4. Extensive ascites. 5. Anasarca. 6. Multiple compression fractures, most likely all old. Electronically Signed   By: Lorriane Shire M.D.   On: 02/05/2018 09:54   Mr Brain Wo Contrast  Result Date: 01/30/2018 CLINICAL DATA:  Status epilepticus. EXAM: MRI HEAD WITHOUT CONTRAST TECHNIQUE: Multiplanar, multiecho pulse sequences of the brain and surrounding structures were obtained without intravenous contrast. COMPARISON:  CT 01/28/2018.  MRI 02/15/2017. FINDINGS: Brain:  Diffusion imaging does not show any acute or subacute infarction. There is cerebellar atrophy without focal finding. Brainstem is normal. Cerebral hemispheres show moderate generalized atrophy. There are scattered small foci of T2 and FLAIR signal within the deep and subcortical white matter most consistent with small vessel change. No cortical or large vessel territory insult. Mesial temporal lobes appear symmetric without focal lesion. No mass,  hemorrhage, hydrocephalus or extra-axial collection. Vascular: Major vessels at the base of the brain show flow. Skull and upper cervical spine: Negative Sinuses/Orbits: Clear/normal Other: None IMPRESSION: No acute or reversible finding. Similar appearance to the study of last year. Moderate brain atrophy. Scattered foci of T2 and FLAIR signal within the white matter most consistent with small vessel disease. No specific finding to explain seizures. Electronically Signed   By: Nelson Chimes M.D.   On: 01/30/2018 15:37   [place] Chest (pleural Effusion)  Result Date: 02/04/2018 CLINICAL DATA:  Pleural effusion, intubated. Thoracentesis requested. EXAM: CHEST ULTRASOUND COMPARISON:  Radiograph from earlier the same day FINDINGS: There is a small effusion at the right lung base. No large pocket for therapeutic thoracentesis. IMPRESSION: Small right pleural effusion.  Thoracentesis deferred. Electronically Signed   By: Lucrezia Europe M.D.   On: 02/04/2018 07:59   Ct Abdomen Pelvis W Contrast  Result Date: 02/10/2018 CLINICAL DATA:  Ascites. No ETOH or significant liver disease. Ovarian/fallopian/peritoneal cancer suspected. EXAM: CT ABDOMEN AND PELVIS WITH CONTRAST TECHNIQUE: Multidetector CT imaging of the abdomen and pelvis was performed using the standard protocol following bolus administration of intravenous contrast. CONTRAST:  143mL OMNIPAQUE IOHEXOL 300 MG/ML  SOLN COMPARISON:  Chest CT dated 02/05/2018. FINDINGS: Lower chest: Small bilateral pleural effusions with adjacent compressive atelectasis. The pleural effusions have decreased in size compared to earlier chest CT of 02/05/2018. Hepatobiliary: Subtle hypodense lesion within the RIGHT liver lobe, corresponding to a benign hemangioma per earlier MRI abdomen of 04/07/2017. No other focal liver abnormality is seen. Gallbladder appears normal. No bile duct dilatation. Pancreas: Difficult to definitively characterize due to mild patient motion artifact, but  grossly normal. No peripancreatic fluid seen. Spleen: Normal in size without focal abnormality. Adrenals/Urinary Tract: Adrenal glands appear normal. Kidneys are unremarkable without mass, stone or hydronephrosis. No ureteral or bladder calculi identified. Bladder decompressed by Foley catheter. Stomach/Bowel: No dilated small bowel loops. Rectosigmoid colon moderately distended with stool and oral contrast material. Pelvic portion of the bowel otherwise difficult to characterize due to obscuring metallic artifact emanating from patient's bilateral hip prostheses. No convincing evidence of bowel wall thickening or bowel wall inflammation within the visualized portions of the abdomen and pelvis. Vascular/Lymphatic: Aortic atherosclerosis. No enlarged abdominal or pelvic lymph nodes. Reproductive: Status post hysterectomy. No obvious adnexal mass or free fluid, however, the adnexal regions are partially obscured by metallic artifact emanating from patient's bilateral hip prostheses. Other: The ascites appreciated within the upper abdomen on earlier chest CT of 02/05/2018 is not present on today's exam. There is no ascites appreciated on today's study. There could be of small amount of obscured ascites within the lower pelvis, as detailed above. No evidence of abscess. No free intraperitoneal air seen. No evidence of omental caking seen. No evidence of peritoneal or mesenteric implants. Musculoskeletal: Chronic appearing compression deformities of the L1 through L3 vertebral bodies, likely accentuated by chronic degenerative Schmorl's nodes. Additional chronic compression fracture deformity at the T8 vertebral body level, as also seen on earlier chest CT. No acute appearing osseous abnormality. No evidence of osseous metastasis seen. IMPRESSION:  1. No appreciable ascites on today's exam. The ascites seen within the upper abdomen on earlier chest CT of 02/05/2018 is no longer present. As above, there could be a small  amount of ascites within the lower pelvis which is obscured by metallic artifact emanating from patient's bilateral hip prostheses. 2. Overall, no acute findings within the abdomen or pelvis. No omental caking, mesenteric or peritoneal implants, or other evidence of neoplastic process. Again, the lower pelvis is incompletely visualized due to obscuring metallic artifact emanating from patient's bilateral hip prostheses. 3. Small bilateral pleural effusions with adjacent compressive atelectasis. The pleural effusions have decreased in size compared to the earlier chest CT. 4. Benign hemangioma within the RIGHT liver lobe, more definitively characterized on earlier abdomen MRI. 5. Aortic atherosclerosis. 6. Chronic appearing compression fracture deformities of the L1 through L3 vertebral bodies, 50-70% compression anteriorly at each level, accentuated by chronic degenerative Schmorl's nodes. No acute or suspicious osseous findings. Electronically Signed   By: Franki Cabot M.D.   On: 02/10/2018 14:53   Dg Chest Port 1 View  Result Date: 02/08/2018 CLINICAL DATA:  Intubation EXAM: PORTABLE CHEST 1 VIEW COMPARISON:  02/03/2018 FINDINGS: Interval placement of right PICC line. The tip is in the upper right atrium approximately 3 cm deep to the cavoatrial junction. NG tube is in the stomach, unchanged. Stable position of the endotracheal tube. Low lung volumes. Bilateral pleural effusions and bibasilar opacities, likely atelectasis, somewhat improving since prior study. Heart is normal size. IMPRESSION: Low lung volumes with bilateral effusions and bibasilar opacities, likely atelectasis. These have improved slightly since prior study. Right PICC line tip in the upper right atrium approximately 3 cm below the cavoatrial junction. Electronically Signed   By: Rolm Baptise M.D.   On: 02/08/2018 09:50   Dg Chest Port 1 View  Result Date: 02/03/2018 CLINICAL DATA:  ETT placement evaluation. EXAM: PORTABLE CHEST 1 VIEW  COMPARISON:  February 03, 2018 FINDINGS: The ETT and NG tubes are in good position. The cardiomediastinal silhouette is stable. Known left rib fractures. No pneumothorax. Layering effusions and underlying opacities bilaterally are similar since February 01, 2018 but more prominent when compared to February 03, 2018. The difference may be positional. IMPRESSION: 1. Support apparatus as above.  ET tube in good position. 2. Left rib fractures without pneumothorax. 3. Layering bilateral pleural effusions are more prominent since February 03, 2018 but similar since February 01, 2018, possibly due to difference in positioning. Electronically Signed   By: Dorise Bullion III M.D   On: 02/03/2018 11:47   Dg Chest Port 1 View  Result Date: 02/03/2018 CLINICAL DATA:  Patient with respiratory failure EXAM: PORTABLE CHEST 1 VIEW COMPARISON:  Chest radiograph 02/01/2018 FINDINGS: ET tube terminates within the proximal right mainstem bronchus. Stable cardiac and mediastinal contours. Enteric tube tip and side-port project over the stomach. Monitoring leads overlie the patient. Layering moderate right and small left pleural effusions with underlying opacities. Low lung volumes. No pneumothorax. IMPRESSION: ET tube terminates in the proximal right mainstem bronchus. Recommend retraction approximately 2 cm. Right-greater-than-left layering effusions with underlying opacities. These results were called by telephone at the time of interpretation on 02/03/2018 at 8:46 am to Nurse [facility] , who verbally acknowledged these results. Electronically Signed   By: Lovey Newcomer M.D.   On: 02/03/2018 08:48   Dg Chest Portable 1 View  Result Date: 02/01/2018 CLINICAL DATA:  Respiratory failure EXAM: PORTABLE CHEST 1 VIEW COMPARISON:  01/31/2018 FINDINGS: Cardiac shadows within normal  limits. Endotracheal tube and nasogastric catheter are stable in appearance. Bilateral pleural effusions are noted right greater than left. Mild underlying atelectatic  changes are noted as well. No new focal abnormality is seen. IMPRESSION: Overall stable appearance of the chest when compared with the previous day. Electronically Signed   By: Inez Catalina M.D.   On: 02/01/2018 08:30   Dg Chest Port 1 View  Result Date: 01/31/2018 CLINICAL DATA:  Respiratory failure EXAM: PORTABLE CHEST 1 VIEW COMPARISON:  01/30/2018 FINDINGS: Cardiac shadow is stable. Endotracheal tube is now noted just above the carina. Nasogastric catheter is noted within the stomach. Slight increase in right-sided pleural effusion is noted. Mild bibasilar atelectatic changes are again seen. IMPRESSION: Increasing right-sided effusion.  Bibasilar atelectasis remains. Electronically Signed   By: Inez Catalina M.D.   On: 01/31/2018 08:31   Dg Chest Portable 1 View  Result Date: 01/30/2018 CLINICAL DATA:  Respiratory failure EXAM: PORTABLE CHEST 1 VIEW COMPARISON:  01/29/2018 FINDINGS: Cardiac shadow is stable. Endotracheal tube and nasogastric catheter are again noted and stable. Mild bibasilar atelectatic changes are noted stable from the prior exam. No new focal abnormality is seen. IMPRESSION: Stable bibasilar changes. Electronically Signed   By: Inez Catalina M.D.   On: 01/30/2018 08:54   Dg Chest Port 1 View  Result Date: 01/29/2018 CLINICAL DATA:  Central line placement.  Fever. EXAM: PORTABLE CHEST 1 VIEW COMPARISON:  01/28/2018 FINDINGS: Endotracheal and NG tubes are stable. Increased bibasilar atelectasis versus airspace disease right greater than left. Normal heart size. Upper lungs clear. There are no new central venous catheters. IMPRESSION: Increased bibasilar atelectasis versus airspace disease right greater than left. Electronically Signed   By: Marybelle Killings M.D.   On: 01/29/2018 19:42   Dg Chest Portable 1 View  Result Date: 01/29/2018 CLINICAL DATA:  Intubated.  Seizure. EXAM: PORTABLE CHEST 1 VIEW COMPARISON:  02/17/2017. FINDINGS: Normal sized heart. Tortuous and calcified thoracic  aorta. Endotracheal tube tip 3.6 cm above the carina. Nasogastric tube extending into the stomach. Bilateral nipple shadows. Clear lungs. Proximal left humerus fixation hardware. IMPRESSION: No acute abnormality. Electronically Signed   By: Claudie Revering M.D.   On: 01/29/2018 00:05   [place] Ekg Site Rite  Result Date: 02/05/2018 If Site Rite image not attached, placement could not be confirmed due to current cardiac rhythm.  US Abdomen Limited Ruq  Result Date: 02/09/2018 CLINICAL DATA:  Ascites. EXAM: ULTRASOUND ABDOMEN LIMITED RIGHT UPPER QUADRANT COMPARISON:  MRI 04/07/2017.  Ultrasound 02/16/2017. FINDINGS: Gallbladder: No gallstones or wall thickening visualized. No sonographic Murphy sign noted by sonographer. Common bile duct: Diameter: 4.2 mm Liver: 1.5 cm hyperechoic noted the right lobe liver consistent with hemangioma previously identified on prior MRI of 04/07/2017. No significant hepatic mass lesion identified. Portal vein is patent on color Doppler imaging with normal direction of blood flow towards the liver. Trace ascites. IMPRESSION: 1.  No gallstones or biliary distention. 2. 1.5 cm hyperechoic nodule noted the right lobe liver consistent with hemangioma previously identified on prior MRI of 04/07/2017. No significant hepatic mass identified. 3.  Trace ascites. Electronically Signed   By: Marcello Moores  Register   On: 02/09/2018 06:31   (Echo, Carotid, EGD, Colonoscopy, ERCP)    Subjective:   Discharge Exam: Vitals:   02/13/18 0340 02/13/18 0838  BP: 102/67 110/63  Pulse: 75 91  Resp: 18 20  Temp: 97.9 F (36.6 C) 98.2 F (36.8 C)  SpO2: 99% 98%   Vitals:   02/12/18 2105 02/12/18  2352 02/13/18 0340 02/13/18 0838  BP: 109/64 102/66 102/67 110/63  Pulse: 88 86 75 91  Resp: 18 18 18 20   Temp: 98.8 F (37.1 C) 98.2 F (36.8 C) 97.9 F (36.6 C) 98.2 F (36.8 C)  TempSrc: Oral Oral Oral Oral  SpO2: 96% 97% 99% 98%  Weight:      Height:        General: Pt is alert, awake, not  in acute distress Cardiovascular: RRR, S1/S2 +, no rubs, no gallops Respiratory: CTA bilaterally, no wheezing, no rhonchi Abdominal: Soft, NT, ND, bowel sounds + Extremities: no edema, no cyanosis    The results of significant diagnostics from this hospitalization (including imaging, microbiology, ancillary and laboratory) are listed below for reference.     Microbiology: No results found for this or any previous visit (from the past 240 hour(s)).   Labs: BNP (last 3 results) Recent Labs    02/03/18 1938  BNP 8,676.7*   Basic Metabolic Panel: Recent Labs  Lab 02/06/18 1221  02/07/18 0343 02/08/18 0541 02/09/18 0400 02/10/18 0444 02/11/18 0426 02/13/18 0350  NA  --    < > 141 143 140 138 135 136  K 4.2  --  3.3* 2.9* 4.0 4.5 4.4 4.5  CL  --    < > 104 109 100 99 98 97*  CO2  --    < > 29 27 31 29 28 28   GLUCOSE  --    < > 130* 125* 94 93 90 95  BUN  --    < > 9 11 20  25* 19 19  CREATININE  --    < > 0.42* 0.39* 0.56 0.51 0.60 0.51  CALCIUM  --    < > 8.2* 7.7* 10.0 9.9 9.3 9.9  MG 2.0  --  1.7 1.8 2.2  --   --   --   PHOS  --   --   --  3.1  --   --   --   --    < > = values in this interval not displayed.   Liver Function Tests: Recent Labs  Lab 02/07/18 0343 02/09/18 0400 02/11/18 0426  AST  --  20 27  ALT  --  25 26  ALKPHOS  --  64 81  BILITOT  --  0.6 0.5  PROT  --  6.6 6.3*  ALBUMIN 3.1* 4.9 4.4   No results for input(s): LIPASE, AMYLASE in the last 168 hours. No results for input(s): AMMONIA in the last 168 hours. CBC: Recent Labs  Lab 02/07/18 0343 02/08/18 0541 02/09/18 0400 02/11/18 0426  WBC 3.9* 3.1* 4.3 6.3  NEUTROABS 2.3 1.9 2.9 4.5  HGB 9.0* 7.9* 9.3* 10.5*  HCT 28.3* 24.8* 29.2* 32.1*  MCV 105.2* 107.4* 105.4* 103.5*  PLT 193 180 246 329   Cardiac Enzymes: Recent Labs  Lab 02/06/18 2204 02/07/18 0343 02/07/18 0940  TROPONINI 0.06* 0.06* 0.07*   BNP: Invalid input(s): POCBNP CBG: Recent Labs  Lab 02/11/18 0337  02/11/18 0846 02/11/18 1205 02/11/18 1623 02/11/18 2128  GLUCAP 76 91 114* 99 97   D-Dimer No results for input(s): DDIMER in the last 72 hours. Hgb A1c No results for input(s): HGBA1C in the last 72 hours. Lipid Profile No results for input(s): CHOL, HDL, LDLCALC, TRIG, CHOLHDL, LDLDIRECT in the last 72 hours. Thyroid function studies No results for input(s): TSH, T4TOTAL, T3FREE, THYROIDAB in the last 72 hours.  Invalid input(s): FREET3 Anemia work up No results for  input(s): VITAMINB12, FOLATE, FERRITIN, TIBC, IRON, RETICCTPCT in the last 72 hours. Urinalysis    Component Value Date/Time   COLORURINE YELLOW 01/29/2018 1721   APPEARANCEUR CLEAR 01/29/2018 1721   LABSPEC 1.015 01/29/2018 1721   PHURINE 5.0 01/29/2018 1721   GLUCOSEU NEGATIVE 01/29/2018 1721   HGBUR SMALL (A) 01/29/2018 1721   BILIRUBINUR NEGATIVE 01/29/2018 1721   KETONESUR 20 (A) 01/29/2018 1721   PROTEINUR NEGATIVE 01/29/2018 1721   NITRITE NEGATIVE 01/29/2018 1721   LEUKOCYTESUR NEGATIVE 01/29/2018 1721   Sepsis Labs Invalid input(s): PROCALCITONIN,  WBC,  LACTICIDVEN Microbiology No results found for this or any previous visit (from the past 240 hour(s)).   Time coordinating discharge:  35 minutes  SIGNED:   Georgette Shell, MD  [facility] 02/13/2018, 11:11 AM Pager   If 7PM-7AM, please contact night-coverage [email or web address] Password [email or web address]

## 2018-02-13 NOTE — Progress Notes (Signed)
Inpatient Rehabilitation  Met with patient at bedside to discuss team's recommendation for IP Rehab.  Shared booklets, insurance verification letter, and answered questions.  Patient reports that she is doing well with therapy, but wants to be Mod I prior to returning home since spouse works.  Have initiated insurance authorization with [employer].  Plan to follow for timing of medical readiness, insurance approval, and IP Rehab bed availability.  Plan to update team as soon as I have a decision.  Call if questions.   Carmelia Roller., CCC/SLP Admission Coordinator  [place]  Cell [phone]

## 2018-02-13 NOTE — Progress Notes (Signed)
Physical Therapy Treatment Patient Details Name: Samantha Atkins MRN: [id number] DOB: 12-Nov-1950 Today's Date: 02/13/2018    History of Present Illness 67 yo female with past medical history of Anxiety, Arthritis, Depression, Fracture of humerus, proximal, left, closed (08/07/2015), Fracture, humerus closed (07-31-15 fell), GERD (gastroesophageal reflux disease), Headache, Insomnia, Migraines, and Osteoporosis. Pt presenting to ED with seizures on 6/23. Intubated on 6/23; extubated 7/5. MRI of brain negative for acute findings and moderate brain atrophy.    PT Comments    Patient is making progress toward PT goals and tolerated session well. This session focused on gait training with SPC vs RW, balance, and strengthening. Current plan remains appropriate.    Follow Up Recommendations  CIR     Equipment Recommendations  None recommended by PT    Recommendations for Other Services Rehab consult     Precautions / Restrictions Precautions Precautions: Fall;Other (comment)(seizures ) Restrictions Weight Bearing Restrictions: No    Mobility  Bed Mobility Overal bed mobility: Modified Independent Bed Mobility: Supine to Sit;Sit to Supine           General bed mobility comments: increased time and effort and use of rail  Transfers Overall transfer level: Needs assistance Equipment used: Rolling walker (2 wheeled) Transfers: Sit to/from Stand Sit to Stand: Min assist         General transfer comment: assist to power up and to gain balance   Ambulation/Gait Ambulation/Gait assistance: Min guard;Min assist Gait Distance (Feet): (131ft X2) Assistive device: Rolling walker (2 wheeled);Straight cane Gait Pattern/deviations: Step-through pattern;Decreased stride length;Trunk flexed;Narrow base of support;Ataxic(L LE ataxia) Gait velocity: decreased    General Gait Details: initially pt ambulated with RW and then with SPC; cues for sequencing of gait with use of SPC, for posture,  and increased bilat step lengths; pt required increased assistance for balance with use of SPC and with LOB X3 with assistance required to recover   Stairs             Wheelchair Mobility    Modified Rankin (Stroke Patients Only)       Balance Overall balance assessment: Needs assistance Sitting-balance support: No upper extremity supported;Feet supported Sitting balance-Leahy Scale: Good     Standing balance support: Bilateral upper extremity supported;During functional activity Standing balance-Leahy Scale: Fair               High level balance activites: Side stepping;Backward walking;Direction changes High Level Balance Comments: pt required assistance for all high level balance activities            Cognition Arousal/Alertness: Awake/alert Behavior During Therapy: WFL for tasks assessed/performed Overall Cognitive Status: Within Functional Limits for tasks assessed                                        Exercises General Exercises - Lower Extremity Toe Raises: Both;Strengthening Heel Raises: Both;Strengthening Mini-Sqauts: 10 reps;Strengthening Other Exercises Other Exercises: bilat LE step ups (10 reps each LE)    General Comments        Pertinent Vitals/Pain Pain Assessment: Faces Faces Pain Scale: Hurts a little bit Pain Descriptors / Indicators: Headache Pain Intervention(s): Monitored during session    Home Living                      Prior Function            PT Goals (current  goals can now be found in the care plan section) Acute Rehab PT Goals Patient Stated Goal: Return to independence PT Goal Formulation: With patient Potential to Achieve Goals: Good Progress towards PT goals: Progressing toward goals    Frequency    Min 4X/week      PT Plan Current plan remains appropriate    Co-evaluation              AM-PAC PT "6 Clicks" Daily Activity  Outcome Measure  Difficulty turning over  in bed (including adjusting bedclothes, sheets and blankets)?: A Little Difficulty moving from lying on back to sitting on the side of the bed? : Unable Difficulty sitting down on and standing up from a chair with arms (e.g., wheelchair, bedside commode, etc,.)?: Unable Help needed moving to and from a bed to chair (including a wheelchair)?: A Little Help needed walking in hospital room?: A Little Help needed climbing 3-5 steps with a railing? : A Little 6 Click Score: 14    End of Session Equipment Utilized During Treatment: Gait belt Activity Tolerance: Patient tolerated treatment well Patient left: with call bell/phone within reach;in bed;with bed alarm set Nurse Communication: Mobility status PT Visit Diagnosis: Unsteadiness on feet (R26.81);Muscle weakness (generalized) (M62.81)     Time: 3614-4315 PT Time Calculation (min) (ACUTE ONLY): 32 min  Charges:  $Gait Training: 8-22 mins $Therapeutic Exercise: 8-22 mins                    G Codes:       Earney Navy, PTA Pager: [phone]     Darliss Cheney 02/13/2018, 10:54 AM

## 2018-02-14 ENCOUNTER — Inpatient Hospital Stay (HOSPITAL_COMMUNITY): Payer: Self-pay | Admitting: Physical Therapy

## 2018-02-14 ENCOUNTER — Inpatient Hospital Stay (HOSPITAL_COMMUNITY): Payer: Self-pay

## 2018-02-14 ENCOUNTER — Inpatient Hospital Stay (HOSPITAL_COMMUNITY): Payer: Self-pay | Admitting: Occupational Therapy

## 2018-02-14 DIAGNOSIS — R188 Other ascites: Secondary | ICD-10-CM

## 2018-02-14 MED ORDER — VITAMIN B-1 100 MG PO TABS: 100.0000 mg | ORAL_TABLET | Freq: Every day | ORAL | Status: DC

## 2018-02-14 MED ORDER — BUTALBITAL-APAP-CAFFEINE 50-325-40 MG PO TABS: 1.0000 | ORAL_TABLET | Freq: Four times a day (QID) | ORAL | 0 refills | Status: DC | PRN

## 2018-02-14 MED ORDER — LEVETIRACETAM 500 MG PO TABS: 500.0000 mg | ORAL_TABLET | Freq: Two times a day (BID) | ORAL | 1 refills | Status: DC

## 2018-02-14 MED ORDER — PHENOBARBITAL 64.8 MG PO TABS: 64.8000 mg | ORAL_TABLET | Freq: Two times a day (BID) | ORAL | 1 refills | Status: DC

## 2018-02-14 MED ORDER — PHENOBARBITAL 32.4 MG PO TABS: 64.8000 mg | ORAL_TABLET | Freq: Two times a day (BID) | ORAL | Status: DC

## 2018-02-14 MED ORDER — PREGABALIN 150 MG PO CAPS: 150.0000 mg | ORAL_CAPSULE | Freq: Three times a day (TID) | ORAL | 0 refills | Status: DC

## 2018-02-14 NOTE — Plan of Care (Signed)
  Problem: Education: Goal: Knowledge of General Education information will improve Outcome: Progressing   

## 2018-02-14 NOTE — Progress Notes (Signed)
Discharge instructions, RX's and follow up appts explained and provided to patient verbalized understanding. ° °Khalid Lacko Lynn, RN ° °

## 2018-02-14 NOTE — Care Management Note (Signed)
Case Management Note  Patient Details  Name: Samantha Atkins MRN: [id number] Date of Birth: 1951-05-27  Subjective/Objective:                    Action/Plan: Pt is doing too well physically for CIR. CM met with the patient and she is interested in [facility] at home. MD updated and orders entered. CM provided her choice and she selected [facility] for [facility] services. Butch Penny with [facility] notified and accepted the referral. Pt states she has needed DME at home.  Pts spouse able to provide transportation home.   Expected Discharge Date:  02/14/18               Expected Discharge Plan:  [place]  In-House Referral:     Discharge planning Services  CM Consult  Post Acute Care Choice:  Home Health Choice offered to:  Patient  DME Arranged:    DME Agency:     HH Arranged:  PT [place]:  [place]  Status of Service:  Completed, signed off  If discussed at [place] of Stay Meetings, dates discussed:    Additional Comments:  Pollie Friar, RN 02/14/2018, 2:35 PM

## 2018-02-14 NOTE — Progress Notes (Signed)
Occupational Therapy Treatment Patient Details Name: Samantha Atkins MRN: [id number] DOB: 1950/10/11 Today's Date: 02/14/2018    History of present illness Pt is a 67 y/o female with past medical history of Anxiety, Arthritis, Depression, Fracture of humerus, proximal, left, closed (08/07/2015), Fracture, humerus closed (07-31-15 fell), GERD (gastroesophageal reflux disease), Headache, Insomnia, Migraines, and Osteoporosis. Pt presenting to ED with seizures on 6/23. Intubated on 6/23; extubated 7/5. MRI of brain negative for acute findings and moderate brain atrophy.   OT comments  Pt progressing towards OT goals and is motivated to work with therapy. Pt completing toileting and standing grooming ADLs with minguard-minA this session. Demonstrating room level functional mobility using RW with close minguard assist, and additional focus on short distance room level functional mobility using single UE support (HHA) with pt requiring minA for balance. Feel pt remains a good candidate for CIR level services. Will continue to follow acutely to progress towards OT goals.   Follow Up Recommendations  CIR;Supervision/Assistance - 24 hour    Equipment Recommendations  Other (comment);3 in 1 bedside commode(TBD in next venue )          Precautions / Restrictions Precautions Precautions: Fall;Other (comment)(seizures ) Restrictions Weight Bearing Restrictions: No       Mobility Bed Mobility Overal bed mobility: Modified Independent Bed Mobility: Sit to Supine           General bed mobility comments: increased time  Transfers Overall transfer level: Needs assistance Equipment used: Rolling walker (2 wheeled);1 person hand held assist Transfers: Sit to/from Stand Sit to Stand: Min guard         General transfer comment: good technique, min guard for safety    Balance Overall balance assessment: Needs assistance Sitting-balance support: No upper extremity supported;Feet  supported Sitting balance-Leahy Scale: Good     Standing balance support: During functional activity;Single extremity supported;No upper extremity supported Standing balance-Leahy Scale: Fair Standing balance comment: pt able to static stand without UE support for grooming ADLs, minguard assist for safety; requires UE support for dynamic mobility              High level balance activites: Backward walking;Direction changes;Other (comment)(ambulating with one UE support on railing, 10' x2) High Level Balance Comments: pt with one UE support on hand rail, min guard for safety           ADL either performed or assessed with clinical judgement   ADL Overall ADL's : Needs assistance/impaired     Grooming: Min guard;Standing;Wash/dry hands Grooming Details (indicate cue type and reason): minguard for standing balance                  Toilet Transfer: Minimal assistance;Ambulation;Regular Toilet;Grab bars;RW Armed forces technical officer Details (indicate cue type and reason): Min A for safe descent and to power up into standing.  Toileting- Clothing Manipulation and Hygiene: Minimal assistance;Sit to/from stand Toileting - Clothing Manipulation Details (indicate cue type and reason): minA for gown management      Functional mobility during ADLs: Rolling walker;Min guard;Minimal assistance General ADL Comments: pt performing room level functional mobility to bathroom and back to recliner for toileting and standing grooming ADLs, use of RW with overall minguard assist; practiced ambulation in room with HHA and pt completing with overall minA for balance; pt reporting feeling increased lightheadedness end of session, VSS and assisted with return to supine in bed  Cognition Arousal/Alertness: Awake/alert Behavior During Therapy: WFL for tasks assessed/performed Overall Cognitive Status: Within Functional Limits for tasks assessed                                                             Pertinent Vitals/ Pain       Pain Assessment: Faces Faces Pain Scale: Hurts little more Pain Location: "migraine"  Pain Descriptors / Indicators: Headache Pain Intervention(s): Monitored during session                                                          Frequency  Min 2X/week        Progress Toward Goals  OT Goals(current goals can now be found in the care plan section)  Progress towards OT goals: Progressing toward goals  Acute Rehab OT Goals Patient Stated Goal: Return to independence OT Goal Formulation: With patient Time For Goal Achievement: 02/24/18 Potential to Achieve Goals: Good  Plan Discharge plan remains appropriate    Co-evaluation                 AM-PAC PT "6 Clicks" Daily Activity     Outcome Measure   Help from another person eating meals?: None Help from another person taking care of personal grooming?: A Little Help from another person toileting, which includes using toliet, bedpan, or urinal?: A Little Help from another person bathing (including washing, rinsing, drying)?: A Little Help from another person to put on and taking off regular upper body clothing?: A Little Help from another person to put on and taking off regular lower body clothing?: A Little 6 Click Score: 19    End of Session Equipment Utilized During Treatment: Rolling walker;Gait belt  OT Visit Diagnosis: Unsteadiness on feet (R26.81);Other abnormalities of gait and mobility (R26.89);Muscle weakness (generalized) (M62.81)   Activity Tolerance Patient tolerated treatment well   Patient Left in bed;with call bell/phone within reach;with bed alarm set   Nurse Communication Mobility status        Time: 1020-1045 OT Time Calculation (min): 25 min  Charges: OT General Charges $OT Visit: 1 Visit OT Treatments $Self Care/Home Management : 23-37 mins  Lou Cal, OT Pager  [phone] 02/14/2018    [name] 02/14/2018, 11:58 AM

## 2018-02-14 NOTE — Progress Notes (Signed)
Inpatient Rehabilitation  Continue to await insurance decision; however, reviewed updated therapy notes and patient is now too high-level for CIR. Home with home health therapies is recommended given that patient reports she doesn't have transportation to outpatient.  Notified patient and nurse case manager.  Will sign off at this time.  Call if questions.  Carmelia Roller., CCC/SLP Admission Coordinator  [place]  Cell [phone]

## 2018-02-14 NOTE — Plan of Care (Signed)
  Problem: Education: Goal: Knowledge of General Education information will improve 02/14/2018 1811 by Emmaline Life, RN Outcome: Completed/Met 02/14/2018 0945 by Emmaline Life, RN Outcome: Progressing   Problem: Health Behavior/Discharge Planning: Goal: Ability to manage health-related needs will improve Outcome: Completed/Met   Problem: Clinical Measurements: Goal: Ability to maintain clinical measurements within normal limits will improve Outcome: Completed/Met Goal: Will remain free from infection Outcome: Completed/Met Goal: Diagnostic test results will improve Outcome: Completed/Met Goal: Respiratory complications will improve Outcome: Completed/Met Goal: Cardiovascular complication will be avoided Outcome: Completed/Met   Problem: Activity: Goal: Risk for activity intolerance will decrease Outcome: Completed/Met   Problem: Nutrition: Goal: Adequate nutrition will be maintained Outcome: Completed/Met   Problem: Coping: Goal: Level of anxiety will decrease Outcome: Completed/Met   Problem: Elimination: Goal: Will not experience complications related to bowel motility Outcome: Completed/Met Goal: Will not experience complications related to urinary retention Outcome: Completed/Met   Problem: Pain Managment: Goal: General experience of comfort will improve Outcome: Completed/Met   Problem: Skin Integrity: Goal: Risk for impaired skin integrity will decrease Outcome: Completed/Met   Problem: Activity: Goal: Ability to tolerate increased activity will improve Outcome: Completed/Met   Problem: Respiratory: Goal: Ability to maintain a clear airway and adequate ventilation will improve Outcome: Completed/Met   Problem: Role Relationship: Goal: Method of communication will improve Outcome: Completed/Met

## 2018-02-14 NOTE — Progress Notes (Signed)
Physical Therapy Treatment Patient Details Name: Samantha Atkins MRN: [id number] DOB: 09/18/1950 Today's Date: 02/14/2018    History of Present Illness Pt is a 67 y/o female with past medical history of Anxiety, Arthritis, Depression, Fracture of humerus, proximal, left, closed (08/07/2015), Fracture, humerus closed (07-31-15 fell), GERD (gastroesophageal reflux disease), Headache, Insomnia, Migraines, and Osteoporosis. Pt presenting to ED with seizures on 6/23. Intubated on 6/23; extubated 7/5. MRI of brain negative for acute findings and moderate brain atrophy.    PT Comments    Pt making steady progress with functional mobility. Pt able to ambulate in hallway with RW and min guard. Practiced ambulation (forwards and backwards) with single UE support on hand rail with min guard for safety. Pt remains very hopeful and excited to continue to make good progress with further therapy services at CIR. PT will continue to follow acutely.   Follow Up Recommendations  CIR     Equipment Recommendations  None recommended by PT    Recommendations for Other Services Rehab consult     Precautions / Restrictions Precautions Precautions: Fall;Other (comment)(seizures) Restrictions Weight Bearing Restrictions: No    Mobility  Bed Mobility Overal bed mobility: Modified Independent             General bed mobility comments: increased time  Transfers Overall transfer level: Needs assistance Equipment used: Rolling walker (2 wheeled) Transfers: Sit to/from Stand Sit to Stand: Min guard         General transfer comment: good technique, min guard for safety  Ambulation/Gait Ambulation/Gait assistance: Min guard Gait Distance (Feet): 100 Feet(100' x2) Assistive device: Rolling walker (2 wheeled) Gait Pattern/deviations: Step-through pattern;Decreased stride length;Narrow base of support Gait velocity: decreased    General Gait Details: pt ambulating in hallway with RW and min guard for  safety, requiring occasional cues for improved postural alignment. pt with no LOB or need for physical assistance, but with a very slow, cautious gait pattern   Stairs             Wheelchair Mobility    Modified Rankin (Stroke Patients Only)       Balance Overall balance assessment: Needs assistance Sitting-balance support: No upper extremity supported;Feet supported Sitting balance-Leahy Scale: Good     Standing balance support: During functional activity;Single extremity supported;No upper extremity supported Standing balance-Leahy Scale: Fair               High level balance activites: Backward walking;Direction changes;Other (comment)(ambulating with one UE support on railing, 10' x2) High Level Balance Comments: pt with one UE support on hand rail, min guard for safety            Cognition Arousal/Alertness: Awake/alert Behavior During Therapy: WFL for tasks assessed/performed Overall Cognitive Status: Within Functional Limits for tasks assessed                                        Exercises      General Comments        Pertinent Vitals/Pain Pain Assessment: No/denies pain    Home Living                      Prior Function            PT Goals (current goals can now be found in the care plan section) Acute Rehab PT Goals PT Goal Formulation: With patient Potential to Achieve  Goals: Good Progress towards PT goals: Progressing toward goals    Frequency    Min 4X/week      PT Plan Current plan remains appropriate    Co-evaluation              AM-PAC PT "6 Clicks" Daily Activity  Outcome Measure  Difficulty turning over in bed (including adjusting bedclothes, sheets and blankets)?: None Difficulty moving from lying on back to sitting on the side of the bed? : None Difficulty sitting down on and standing up from a chair with arms (e.g., wheelchair, bedside commode, etc,.)?: Unable Help needed moving  to and from a bed to chair (including a wheelchair)?: A Little Help needed walking in hospital room?: A Little Help needed climbing 3-5 steps with a railing? : A Little 6 Click Score: 18    End of Session Equipment Utilized During Treatment: Gait belt Activity Tolerance: Patient tolerated treatment well Patient left: in chair;with call bell/phone within reach;with chair alarm set Nurse Communication: Mobility status PT Visit Diagnosis: Unsteadiness on feet (R26.81);Muscle weakness (generalized) (M62.81)     Time: 6767-2094 PT Time Calculation (min) (ACUTE ONLY): 20 min  Charges:  $Gait Training: 8-22 mins                    G Codes:       [place], [place], [place] 02/14/2018, 9:45 AM

## 2018-02-14 NOTE — Discharge Summary (Signed)
Physician Discharge Summary  Samantha Atkins AYT:[id number] DOB: January 11, 1951 DOA: 01/28/2018  PCP: Dineen Kid, MD  Admit date: 01/28/2018 Discharge date: 02/14/2018  Admitted From: home Disposition:  home  Recommendations for Outpatient Follow-up:  1. Follow up with PCP in 1-2 weeks 2. Follow up with Neurology in 2-3 weeks. Referral made.  Home Health: PT Equipment/Devices: none  Discharge Condition: stable CODE STATUS: Full code Diet recommendation: regular  HPI: Per Dr. Gilford Raid 67 yr old female with PMHx of seizure disorder ( Keppra 500mg  BID compliant), Osteoporosis, Depression( on Lexapro and Zoloft), GERD, Insomnia presented to [facility] with refractory seizures in status.  Hospital Course:  Status epilepticus - in a patient with history of seizure disorder. Patient was admitted to the ICU and was intubated.  Neurology was consulted and followed patient while hospitalized.  She eventually stopped seizing after she was placed on phenobarbital.  Discussed with Dr. Leonel Ramsay on the day of discharge, recommends to continue current regimen.  Right now she is on Keppra, phenobarbital as well as was placed on Lyrica.  He recommended that patient has outpatient follow-up with primary neurologist.  She has remained seizure-free. Macrocytic anemia -  Normal B12 and folate. Hb stable Fluid overload / bilateral pleural effusions and ascites of unclear etiology -resolved with diuretics   Discharge Diagnoses:  Active Problems:   Seizures ([place])   Status epilepticus (HCC)   Hypotension   Acute respiratory failure with hypoxia (HCC)   Endotracheally intubated   Chronic bilateral pleural effusions   Acute combined systolic and diastolic CHF, NYHA class 1 (HCC)   Polysubstance abuse ([place])   Acute blood loss anemia     Discharge Instructions   Allergies as of 02/14/2018      Reactions   Dust Mite Extract Cough      Medication List    STOP taking these medications   sertraline 100 MG  tablet Commonly known as:  ZOLOFT     TAKE these medications   butalbital-acetaminophen-caffeine 50-325-40 MG tablet Commonly known as:  FIORICET, ESGIC Take 1 tablet by mouth every 6 (six) hours as needed for migraine.   calcium citrate-vitamin D 315-200 MG-UNIT tablet Commonly known as:  CITRACAL+D Take 1 tablet by mouth 2 (two) times daily.   cholecalciferol 1000 units tablet Commonly known as:  VITAMIN D Take 1,000 Units by mouth daily.   escitalopram 10 MG tablet Commonly known as:  LEXAPRO Take 10 mg by mouth daily.   feeding supplement Liqd Take 1 Container by mouth daily.   ferrous sulfate 325 (65 FE) MG EC tablet Take 1 tablet (325 mg total) by mouth every other day. What changed:  when to take this   HYDROcodone-acetaminophen 5-325 MG tablet Commonly known as:  NORCO Take 1-2 tablets by mouth every 6 (six) hours as needed. What changed:  reasons to take this   levETIRAcetam 500 MG tablet Commonly known as:  KEPPRA Take 1 tablet (500 mg total) by mouth 2 (two) times daily.   LORazepam 1 MG tablet Commonly known as:  ATIVAN Take 1 tablet (1 mg total) by mouth every 4 (four) hours as needed for anxiety.   methocarbamol 500 MG tablet Commonly known as:  ROBAXIN Take 1 tablet (500 mg total) by mouth every 6 (six) hours as needed for muscle spasms.   multivitamin with minerals Tabs tablet Take 1 tablet by mouth daily.   omeprazole 20 MG capsule Commonly known as:  PRILOSEC Take 20 mg by mouth daily.   PHENobarbital  64.8 MG tablet Commonly known as:  LUMINAL Take 1 tablet (64.8 mg total) by mouth 2 (two) times daily.   potassium chloride SA 20 MEQ tablet Commonly known as:  K-DUR,KLOR-CON Take 20 mEq by mouth daily.   pregabalin 150 MG capsule Commonly known as:  LYRICA Take 1 capsule (150 mg total) by mouth every 8 (eight) hours.   psyllium 95 % Pack Commonly known as:  HYDROCIL/METAMUCIL Take 1 packet by mouth daily.   senna-docusate 8.6-50 MG  tablet Commonly known as:  Senokot-S Take 1 tablet by mouth at bedtime.   zinc gluconate 50 MG tablet Take 50 mg by mouth daily.      Follow-up Information    Via, Lennette Bihari, MD.   Specialty:  Family Medicine Contact information: [place] [address] [phone]           Consultations:  PCCM  Neurology   Procedures/Studies:  2D echo  Impressions: - Normal LV systollic function; mild diastolic dysfunction.   Ct Head Wo Contrast  Result Date: 01/29/2018 CLINICAL DATA:  Unresponsive.  Possible seizure. EXAM: CT HEAD WITHOUT CONTRAST TECHNIQUE: Contiguous axial images were obtained from the base of the skull through the vertex without intravenous contrast. COMPARISON:  Brain MR dated 02/15/2017 and head CT dated 02/13/2017. FINDINGS: Brain: Diffusely enlarged ventricles and subarachnoid spaces. Patchy white matter low density in both cerebral hemispheres. No intracranial hemorrhage, mass lesion or CT evidence of acute infarction. Vascular: No hyperdense vessel or unexpected calcification. Skull: Normal. Negative for fracture or focal lesion. Sinuses/Orbits: Unremarkable. Other: Bilateral temporomandibular joint degenerative changes. IMPRESSION: 1. No acute abnormality. 2. Stable diffuse cerebral and cerebellar atrophy. 3. Stable chronic small vessel white matter ischemic changes in both cerebral hemispheres. Electronically Signed   By: Claudie Revering M.D.   On: 01/29/2018 00:04   Ct Chest Wo Contrast  Result Date: 02/05/2018 CLINICAL DATA:  Acute respiratory failure with hypoxia. Pleural effusions. EXAM: CT CHEST WITHOUT CONTRAST TECHNIQUE: Multidetector CT imaging of the chest was performed following the standard protocol without IV contrast. COMPARISON:  Ultrasound dated 02/03/2018 and chest x-rays dated 02/03/2018 and 02/01/2018 and MRI of the abdomen dated 04/07/2017 FINDINGS: Cardiovascular: Heart size is normal. Aortic atherosclerosis. Coronary artery  calcifications. Mediastinum/Nodes: No enlarged mediastinal or axillary lymph nodes. Thyroid gland, trachea, and esophagus demonstrate no significant findings. NG tube in the stomach. Endotracheal tube tip 2 cm above the carina. Lungs/Pleura: Large bilateral pleural effusions with complete atelectasis of the right lower lobe and partial atelectasis of left lower lobe. The pulmonary vascularity is normal. No infiltrates. Upper Abdomen: Extensive ascites. Musculoskeletal: There are slight compression fractures of the superior endplates of T4 and T6 with a more extensive compression fracture of T8, unchanged since 2018. Prominent Schmorl's node in the superior endplate of T26. anasarca. IMPRESSION: 1. Large bilateral pleural effusions, slightly greater on the right than the left. 2. Complete compressive atelectasis of the right lower lobe. Partial compressive atelectasis of the left lower lobe. 3. No infiltrates.  Pulmonary vascularity and heart size are normal. 4. Extensive ascites. 5. Anasarca. 6. Multiple compression fractures, most likely all old. Electronically Signed   By: Lorriane Shire M.D.   On: 02/05/2018 09:54   Mr Brain Wo Contrast  Result Date: 01/30/2018 CLINICAL DATA:  Status epilepticus. EXAM: MRI HEAD WITHOUT CONTRAST TECHNIQUE: Multiplanar, multiecho pulse sequences of the brain and surrounding structures were obtained without intravenous contrast. COMPARISON:  CT 01/28/2018.  MRI 02/15/2017. FINDINGS: Brain: Diffusion imaging does not show any  acute or subacute infarction. There is cerebellar atrophy without focal finding. Brainstem is normal. Cerebral hemispheres show moderate generalized atrophy. There are scattered small foci of T2 and FLAIR signal within the deep and subcortical white matter most consistent with small vessel change. No cortical or large vessel territory insult. Mesial temporal lobes appear symmetric without focal lesion. No mass, hemorrhage, hydrocephalus or extra-axial  collection. Vascular: Major vessels at the base of the brain show flow. Skull and upper cervical spine: Negative Sinuses/Orbits: Clear/normal Other: None IMPRESSION: No acute or reversible finding. Similar appearance to the study of last year. Moderate brain atrophy. Scattered foci of T2 and FLAIR signal within the white matter most consistent with small vessel disease. No specific finding to explain seizures. Electronically Signed   By: Nelson Chimes M.D.   On: 01/30/2018 15:37   [place] Chest (pleural Effusion)  Result Date: 02/04/2018 CLINICAL DATA:  Pleural effusion, intubated. Thoracentesis requested. EXAM: CHEST ULTRASOUND COMPARISON:  Radiograph from earlier the same day FINDINGS: There is a small effusion at the right lung base. No large pocket for therapeutic thoracentesis. IMPRESSION: Small right pleural effusion.  Thoracentesis deferred. Electronically Signed   By: Lucrezia Europe M.D.   On: 02/04/2018 07:59   Ct Abdomen Pelvis W Contrast  Result Date: 02/10/2018 CLINICAL DATA:  Ascites. No ETOH or significant liver disease. Ovarian/fallopian/peritoneal cancer suspected. EXAM: CT ABDOMEN AND PELVIS WITH CONTRAST TECHNIQUE: Multidetector CT imaging of the abdomen and pelvis was performed using the standard protocol following bolus administration of intravenous contrast. CONTRAST:  152mL OMNIPAQUE IOHEXOL 300 MG/ML  SOLN COMPARISON:  Chest CT dated 02/05/2018. FINDINGS: Lower chest: Small bilateral pleural effusions with adjacent compressive atelectasis. The pleural effusions have decreased in size compared to earlier chest CT of 02/05/2018. Hepatobiliary: Subtle hypodense lesion within the RIGHT liver lobe, corresponding to a benign hemangioma per earlier MRI abdomen of 04/07/2017. No other focal liver abnormality is seen. Gallbladder appears normal. No bile duct dilatation. Pancreas: Difficult to definitively characterize due to mild patient motion artifact, but grossly normal. No peripancreatic fluid seen.  Spleen: Normal in size without focal abnormality. Adrenals/Urinary Tract: Adrenal glands appear normal. Kidneys are unremarkable without mass, stone or hydronephrosis. No ureteral or bladder calculi identified. Bladder decompressed by Foley catheter. Stomach/Bowel: No dilated small bowel loops. Rectosigmoid colon moderately distended with stool and oral contrast material. Pelvic portion of the bowel otherwise difficult to characterize due to obscuring metallic artifact emanating from patient's bilateral hip prostheses. No convincing evidence of bowel wall thickening or bowel wall inflammation within the visualized portions of the abdomen and pelvis. Vascular/Lymphatic: Aortic atherosclerosis. No enlarged abdominal or pelvic lymph nodes. Reproductive: Status post hysterectomy. No obvious adnexal mass or free fluid, however, the adnexal regions are partially obscured by metallic artifact emanating from patient's bilateral hip prostheses. Other: The ascites appreciated within the upper abdomen on earlier chest CT of 02/05/2018 is not present on today's exam. There is no ascites appreciated on today's study. There could be of small amount of obscured ascites within the lower pelvis, as detailed above. No evidence of abscess. No free intraperitoneal air seen. No evidence of omental caking seen. No evidence of peritoneal or mesenteric implants. Musculoskeletal: Chronic appearing compression deformities of the L1 through L3 vertebral bodies, likely accentuated by chronic degenerative Schmorl's nodes. Additional chronic compression fracture deformity at the T8 vertebral body level, as also seen on earlier chest CT. No acute appearing osseous abnormality. No evidence of osseous metastasis seen. IMPRESSION: 1. No appreciable ascites on today's  exam. The ascites seen within the upper abdomen on earlier chest CT of 02/05/2018 is no longer present. As above, there could be a small amount of ascites within the lower pelvis which  is obscured by metallic artifact emanating from patient's bilateral hip prostheses. 2. Overall, no acute findings within the abdomen or pelvis. No omental caking, mesenteric or peritoneal implants, or other evidence of neoplastic process. Again, the lower pelvis is incompletely visualized due to obscuring metallic artifact emanating from patient's bilateral hip prostheses. 3. Small bilateral pleural effusions with adjacent compressive atelectasis. The pleural effusions have decreased in size compared to the earlier chest CT. 4. Benign hemangioma within the RIGHT liver lobe, more definitively characterized on earlier abdomen MRI. 5. Aortic atherosclerosis. 6. Chronic appearing compression fracture deformities of the L1 through L3 vertebral bodies, 50-70% compression anteriorly at each level, accentuated by chronic degenerative Schmorl's nodes. No acute or suspicious osseous findings. Electronically Signed   By: Franki Cabot M.D.   On: 02/10/2018 14:53   Dg Chest Port 1 View  Result Date: 02/08/2018 CLINICAL DATA:  Intubation EXAM: PORTABLE CHEST 1 VIEW COMPARISON:  02/03/2018 FINDINGS: Interval placement of right PICC line. The tip is in the upper right atrium approximately 3 cm deep to the cavoatrial junction. NG tube is in the stomach, unchanged. Stable position of the endotracheal tube. Low lung volumes. Bilateral pleural effusions and bibasilar opacities, likely atelectasis, somewhat improving since prior study. Heart is normal size. IMPRESSION: Low lung volumes with bilateral effusions and bibasilar opacities, likely atelectasis. These have improved slightly since prior study. Right PICC line tip in the upper right atrium approximately 3 cm below the cavoatrial junction. Electronically Signed   By: Rolm Baptise M.D.   On: 02/08/2018 09:50   Dg Chest Port 1 View  Result Date: 02/03/2018 CLINICAL DATA:  ETT placement evaluation. EXAM: PORTABLE CHEST 1 VIEW COMPARISON:  February 03, 2018 FINDINGS: The ETT and  NG tubes are in good position. The cardiomediastinal silhouette is stable. Known left rib fractures. No pneumothorax. Layering effusions and underlying opacities bilaterally are similar since February 01, 2018 but more prominent when compared to February 03, 2018. The difference may be positional. IMPRESSION: 1. Support apparatus as above.  ET tube in good position. 2. Left rib fractures without pneumothorax. 3. Layering bilateral pleural effusions are more prominent since February 03, 2018 but similar since February 01, 2018, possibly due to difference in positioning. Electronically Signed   By: Dorise Bullion III M.D   On: 02/03/2018 11:47   Dg Chest Port 1 View  Result Date: 02/03/2018 CLINICAL DATA:  Patient with respiratory failure EXAM: PORTABLE CHEST 1 VIEW COMPARISON:  Chest radiograph 02/01/2018 FINDINGS: ET tube terminates within the proximal right mainstem bronchus. Stable cardiac and mediastinal contours. Enteric tube tip and side-port project over the stomach. Monitoring leads overlie the patient. Layering moderate right and small left pleural effusions with underlying opacities. Low lung volumes. No pneumothorax. IMPRESSION: ET tube terminates in the proximal right mainstem bronchus. Recommend retraction approximately 2 cm. Right-greater-than-left layering effusions with underlying opacities. These results were called by telephone at the time of interpretation on 02/03/2018 at 8:46 am to Nurse [facility] , who verbally acknowledged these results. Electronically Signed   By: Lovey Newcomer M.D.   On: 02/03/2018 08:48   Dg Chest Portable 1 View  Result Date: 02/01/2018 CLINICAL DATA:  Respiratory failure EXAM: PORTABLE CHEST 1 VIEW COMPARISON:  01/31/2018 FINDINGS: Cardiac shadows within normal limits. Endotracheal tube and nasogastric catheter  are stable in appearance. Bilateral pleural effusions are noted right greater than left. Mild underlying atelectatic changes are noted as well. No new focal abnormality  is seen. IMPRESSION: Overall stable appearance of the chest when compared with the previous day. Electronically Signed   By: Inez Catalina M.D.   On: 02/01/2018 08:30   Dg Chest Port 1 View  Result Date: 01/31/2018 CLINICAL DATA:  Respiratory failure EXAM: PORTABLE CHEST 1 VIEW COMPARISON:  01/30/2018 FINDINGS: Cardiac shadow is stable. Endotracheal tube is now noted just above the carina. Nasogastric catheter is noted within the stomach. Slight increase in right-sided pleural effusion is noted. Mild bibasilar atelectatic changes are again seen. IMPRESSION: Increasing right-sided effusion.  Bibasilar atelectasis remains. Electronically Signed   By: Inez Catalina M.D.   On: 01/31/2018 08:31   Dg Chest Portable 1 View  Result Date: 01/30/2018 CLINICAL DATA:  Respiratory failure EXAM: PORTABLE CHEST 1 VIEW COMPARISON:  01/29/2018 FINDINGS: Cardiac shadow is stable. Endotracheal tube and nasogastric catheter are again noted and stable. Mild bibasilar atelectatic changes are noted stable from the prior exam. No new focal abnormality is seen. IMPRESSION: Stable bibasilar changes. Electronically Signed   By: Inez Catalina M.D.   On: 01/30/2018 08:54   Dg Chest Port 1 View  Result Date: 01/29/2018 CLINICAL DATA:  Central line placement.  Fever. EXAM: PORTABLE CHEST 1 VIEW COMPARISON:  01/28/2018 FINDINGS: Endotracheal and NG tubes are stable. Increased bibasilar atelectasis versus airspace disease right greater than left. Normal heart size. Upper lungs clear. There are no new central venous catheters. IMPRESSION: Increased bibasilar atelectasis versus airspace disease right greater than left. Electronically Signed   By: Marybelle Killings M.D.   On: 01/29/2018 19:42   Dg Chest Portable 1 View  Result Date: 01/29/2018 CLINICAL DATA:  Intubated.  Seizure. EXAM: PORTABLE CHEST 1 VIEW COMPARISON:  02/17/2017. FINDINGS: Normal sized heart. Tortuous and calcified thoracic aorta. Endotracheal tube tip 3.6 cm above the  carina. Nasogastric tube extending into the stomach. Bilateral nipple shadows. Clear lungs. Proximal left humerus fixation hardware. IMPRESSION: No acute abnormality. Electronically Signed   By: Claudie Revering M.D.   On: 01/29/2018 00:05   [place] Ekg Site Rite  Result Date: 02/05/2018 If Site Rite image not attached, placement could not be confirmed due to current cardiac rhythm.  US Abdomen Limited Ruq  Result Date: 02/09/2018 CLINICAL DATA:  Ascites. EXAM: ULTRASOUND ABDOMEN LIMITED RIGHT UPPER QUADRANT COMPARISON:  MRI 04/07/2017.  Ultrasound 02/16/2017. FINDINGS: Gallbladder: No gallstones or wall thickening visualized. No sonographic Murphy sign noted by sonographer. Common bile duct: Diameter: 4.2 mm Liver: 1.5 cm hyperechoic noted the right lobe liver consistent with hemangioma previously identified on prior MRI of 04/07/2017. No significant hepatic mass lesion identified. Portal vein is patent on color Doppler imaging with normal direction of blood flow towards the liver. Trace ascites. IMPRESSION: 1.  No gallstones or biliary distention. 2. 1.5 cm hyperechoic nodule noted the right lobe liver consistent with hemangioma previously identified on prior MRI of 04/07/2017. No significant hepatic mass identified. 3.  Trace ascites. Electronically Signed   By: Marcello Moores  Register   On: 02/09/2018 06:31      Subjective: - no chest pain, shortness of breath, no abdominal pain, nausea or vomiting.   Discharge Exam: Vitals:   02/14/18 1201 02/14/18 1559  BP: 113/66 106/68  Pulse: 93 82  Resp: 18 18  Temp: 98.1 F (36.7 C) 97.6 F (36.4 C)  SpO2: 98% 100%    General: Pt is  alert, awake, not in acute distress Cardiovascular: RRR, S1/S2 +, no rubs, no gallops Respiratory: CTA bilaterally, no wheezing, no rhonchi Abdominal: Soft, NT, ND, bowel sounds + Extremities: no edema, no cyanosis    The results of significant diagnostics from this hospitalization (including imaging, microbiology, ancillary  and laboratory) are listed below for reference.     Microbiology: No results found for this or any previous visit (from the past 240 hour(s)).   Labs: BNP (last 3 results) Recent Labs    02/03/18 1938  BNP 7,622.6*   Basic Metabolic Panel: Recent Labs  Lab 02/08/18 0541 02/09/18 0400 02/10/18 0444 02/11/18 0426 02/13/18 0350  NA 143 140 138 135 136  K 2.9* 4.0 4.5 4.4 4.5  CL 109 100 99 98 97*  CO2 27 31 29 28 28   GLUCOSE 125* 94 93 90 95  BUN 11 20 25* 19 19  CREATININE 0.39* 0.56 0.51 0.60 0.51  CALCIUM 7.7* 10.0 9.9 9.3 9.9  MG 1.8 2.2  --   --   --   PHOS 3.1  --   --   --   --    Liver Function Tests: Recent Labs  Lab 02/09/18 0400 02/11/18 0426  AST 20 27  ALT 25 26  ALKPHOS 64 81  BILITOT 0.6 0.5  PROT 6.6 6.3*  ALBUMIN 4.9 4.4   No results for input(s): LIPASE, AMYLASE in the last 168 hours. No results for input(s): AMMONIA in the last 168 hours. CBC: Recent Labs  Lab 02/08/18 0541 02/09/18 0400 02/11/18 0426  WBC 3.1* 4.3 6.3  NEUTROABS 1.9 2.9 4.5  HGB 7.9* 9.3* 10.5*  HCT 24.8* 29.2* 32.1*  MCV 107.4* 105.4* 103.5*  PLT 180 246 329   Cardiac Enzymes: No results for input(s): CKTOTAL, CKMB, CKMBINDEX, TROPONINI in the last 168 hours. BNP: Invalid input(s): POCBNP CBG: Recent Labs  Lab 02/11/18 0337 02/11/18 0846 02/11/18 1205 02/11/18 1623 02/11/18 2128  GLUCAP 76 91 114* 99 97   D-Dimer No results for input(s): DDIMER in the last 72 hours. Hgb A1c No results for input(s): HGBA1C in the last 72 hours. Lipid Profile No results for input(s): CHOL, HDL, LDLCALC, TRIG, CHOLHDL, LDLDIRECT in the last 72 hours. Thyroid function studies No results for input(s): TSH, T4TOTAL, T3FREE, THYROIDAB in the last 72 hours.  Invalid input(s): FREET3 Anemia work up No results for input(s): VITAMINB12, FOLATE, FERRITIN, TIBC, IRON, RETICCTPCT in the last 72 hours. Urinalysis    Component Value Date/Time   COLORURINE YELLOW 01/29/2018 1721    APPEARANCEUR CLEAR 01/29/2018 1721   LABSPEC 1.015 01/29/2018 1721   PHURINE 5.0 01/29/2018 1721   GLUCOSEU NEGATIVE 01/29/2018 1721   HGBUR SMALL (A) 01/29/2018 1721   BILIRUBINUR NEGATIVE 01/29/2018 1721   KETONESUR 20 (A) 01/29/2018 1721   PROTEINUR NEGATIVE 01/29/2018 1721   NITRITE NEGATIVE 01/29/2018 1721   LEUKOCYTESUR NEGATIVE 01/29/2018 1721   Sepsis Labs Invalid input(s): PROCALCITONIN,  WBC,  LACTICIDVEN   Time coordinating discharge: 45 minutes  SIGNED:  Marzetta Board, MD  [facility] 02/14/2018, 5:47 PM Pager [phone]  If 7PM-7AM, please contact night-coverage [email or web address] Password [email or web address]

## 2018-02-16 ENCOUNTER — Encounter: Payer: Self-pay | Admitting: Neurology

## 2018-03-02 ENCOUNTER — Ambulatory Visit: Payer: Commercial Managed Care - PPO | Admitting: Neurology

## 2018-03-02 ENCOUNTER — Encounter: Payer: Self-pay | Admitting: Neurology

## 2018-03-02 ENCOUNTER — Encounter

## 2018-03-02 VITALS — BP 110/78 | HR 74 | Ht 60.5 in | Wt 84.0 lb

## 2018-03-02 DIAGNOSIS — G43009 Migraine without aura, not intractable, without status migrainosus: Secondary | ICD-10-CM

## 2018-03-02 DIAGNOSIS — F321 Major depressive disorder, single episode, moderate: Secondary | ICD-10-CM | POA: Diagnosis not present

## 2018-03-02 DIAGNOSIS — G40001 Localization-related (focal) (partial) idiopathic epilepsy and epileptic syndromes with seizures of localized onset, not intractable, with status epilepticus: Secondary | ICD-10-CM | POA: Diagnosis not present

## 2018-03-02 MED ORDER — PREGABALIN 150 MG PO CAPS: 150.0000 mg | ORAL_CAPSULE | Freq: Two times a day (BID) | ORAL | 5 refills | Status: DC

## 2018-03-02 MED ORDER — LEVETIRACETAM 500 MG PO TABS: 500.0000 mg | ORAL_TABLET | Freq: Two times a day (BID) | ORAL | 11 refills | Status: DC

## 2018-03-02 MED ORDER — PHENOBARBITAL 64.8 MG PO TABS: 64.8000 mg | ORAL_TABLET | Freq: Two times a day (BID) | ORAL | 5 refills | Status: DC

## 2018-03-02 NOTE — Patient Instructions (Addendum)
1. Take Lyrica 150mg  twice a day, Keppra 500mg  twice a day, and Phenobarbital 64.8mg  twice a day  2. Stop Lexapro (escitalopram) and restart your Sertraline  3. Refer to Behavioral Medicine for Psychiatry and therapy. We have sent a referral to them and they can be reached directly at [phone] to schedule an appointment.   4. Minimize Fioricet or any other over the counter pain medication to 2-3 times a week, otherwise headaches may worsen  5. Follow-up in 3-4 months, call for any changes  Seizure Precautions: 1. If medication has been prescribed for you to prevent seizures, take it exactly as directed.  Do not stop taking the medicine without talking to your doctor first, even if you have not had a seizure in a long time.   2. Avoid activities in which a seizure would cause danger to yourself or to others.  Don't operate dangerous machinery, swim alone, or climb in high or dangerous places, such as on ladders, roofs, or girders.  Do not drive unless your doctor says you may.  3. If you have any warning that you may have a seizure, lay down in a safe place where you can't hurt yourself.    4.  No driving for 6 months from last seizure, as per [facility].   Please refer to the following link on the [place] website for more information: http://[email or web address]/[email or web address]/[email or web address]   5.  Maintain good sleep hygiene. Avoid alcohol  6.  Contact your doctor if you have any problems that may be related to the medicine you are taking.  7.  Call 911 and bring the patient back to the ED if:        A.  The seizure lasts longer than 5 minutes.       B.  The patient doesn't awaken shortly after the seizure  C.  The patient has new problems such as difficulty seeing, speaking or moving  D.  The patient was injured during the seizure  E.  The patient has a temperature over 102 F (39C)  F.  The patient vomited and now is having  trouble breathing

## 2018-03-02 NOTE — Progress Notes (Signed)
NEUROLOGY CONSULTATION NOTE  Samantha Atkins MRN: [id number] DOB: Oct 18, 1950  Referring provider: Dr. Marzetta Board Primary care provider: Dr. Lennette Bihari Via  Reason for consult:  Follow-up after status epilepticus  Dear Dr Cruzita Lederer:  Thank you for your kind referral of Samantha Atkins for consultation of the above symptoms. Although her history is well known to you, please allow me to reiterate it for the purpose of our medical record. The patient was accompanied to the clinic by her husband who also provides collateral information. Records and images were personally reviewed where available.  HISTORY OF PRESENT ILLNESS: This is a pleasant 67 year old right-handed woman with a history of osteoporosis, depression, GERD, presenting for hospital follow-up after hospital admission for status epilepticus. Records were reviewed. She was admitted for a fall in July 2018 and was found to have a left hip fracture. She had no recollection of the fall and was getting ready for surgery when she had an episode of staring and unresponsiveness with some hand wringing movements. On further questioning, she had ben having these episodes for several years which her husband had attributed to her underlying psychiatric condition. She has severe depression and anxiety. Her husband reported multiple falls, they were in the bedroom one time and he noted an awkward pause in conversation then she "fell like a tree." No convulsive activity noted. She had an EEG which showed mild to moderate diffuse slowing, no epileptiform discharges seen. CT head showed diffuse cerebral and cerebellar atrophy, chronic microvascular disease. She was discharged on Keppra 500mg  BID. She was brought back to the hospital last 01/29/18 for status epilepticus. Her husband found her partway off the bed unresponsive, foaming at the mouth. She was given IV Versed, then had another seizure and was given IV Ativan. ER noted eyelid twitching and right gaze  deviation, as well as right arm and leg twitching. She underwent 5 days of inpatient video EEG monitoring, initial EEG showed focal slowing on the right posterior region with runs of brief rhythmic activity in the left frontal region. She had worsening changes on EEG, with electrographic seizures arising from the right anterior frontal and paracentral region. Several seizure medications were started, with complete resolution of right PLEDs on addition of phenobarbital. There was also occasional left posterior tempora/parietal epileptiform discharges seen. I personally reviewed MRI brain without contrast which did not show any acute changes, there was moderate brain atrophy, mild chronic microvascular disease. She had a lumbar puncture with slightly elevated protein of 78, negative HSV. Over the course of her hospital stay, Keppra and Dilantin were discontinued, she was discharged home on Phenobarbital 60mg  BID and Lyrica 150mg  every 8 hours. Her husband denies any further seizures since hospital discharge 2 weeks ago. Her husband fills her pillbox but she takes medications herself, occasionally missing the noon dose of Lyrica. She denies any olfactory/gustatory hallucinations, deja vu, rising epigastric sensation, focal numbness/tingling/weakness, myoclonic jerks. She has tingling in both feet and reports a history of leg length discrepancy. She has a history of migraines with severe migraines 2-3 times a week, lasting 3-5 days. They used to be over the frontal region but lately have been in the back of her head, with nausea, vomiting, photo/phonophobia. She denies any migraine aura or visual obscurations. No family history of migraines. She has a lot of neck pain and shoulder pain. No dizziness, diplopia, dysarthria/dysphagia, bowel/bladder dysfunction. She initially had some urinary incontinence which is better. She has been using her walker since a  fall in 2013 where she broke her right leg. She has been dealing  with a lot of stress and depression, worsening her headaches. She takes Fioricet every 6 hours as needed and states she does not take it daily. She reports severe depression and insomnia, she reports Sertraline was stopped in the hospital and asks about resuming medication. Melatonin did not help with sleep.   Epilepsy Risk Factors:  She had a normal birth and early development.  There is no history of febrile convulsions, CNS infections such as meningitis/encephalitis, significant traumatic brain injury, neurosurgical procedures, or family history of seizures.   PAST MEDICAL HISTORY: Past Medical History:  Diagnosis Date  . Anxiety   . Arthritis    hands  . Cancer (HCC)    HISTORY OF OVARIAN CANCER  . Depression   . Fracture of humerus, proximal, left, closed 08/07/2015  . Fracture, humerus closed 07-31-15 fell   left  . GERD (gastroesophageal reflux disease)   . Headache   . Insomnia   . Migraines   . Osteoporosis   . S/P right hip fracture 1990   fractures X 2  . Seizures ([place])     PAST SURGICAL HISTORY: Past Surgical History:  Procedure Laterality Date  . ABDOMINAL HYSTERECTOMY    . FEMUR FRACTURE SURGERY Left    fell, redo hip replacement  . JOINT REPLACEMENT Bilateral    hip  . ORIF DISTAL RADIUS FRACTURE Left 03-2015  . ORIF HUMERUS FRACTURE Left 08/07/2015   Procedure: LEFT OPEN REDUCTION INTERNAL FIXATION (ORIF) PROXIMAL HUMERUS FRACTURE;  Surgeon: Marchia Bond, MD;  Location: [facility];  Service: Orthopedics;  Laterality: Left;  . TONSILLECTOMY    . TOTAL HIP REVISION Left 02/14/2017   Procedure: LEFT ORIF PERIPROSTHETIC FEMORAL FRACTURE AND REVISION HIP ARTHROPLASTY;  Surgeon: Paralee Cancel, MD;  Location: [facility];  Service: Orthopedics;  Laterality: Left;    MEDICATIONS: Current Outpatient Medications on File Prior to Visit  Medication Sig Dispense Refill  . butalbital-acetaminophen-caffeine (FIORICET, ESGIC) 50-325-40 MG tablet Take 1 tablet by  mouth every 6 (six) hours as needed for migraine. 20 tablet 0  . calcium citrate-vitamin D (CITRACAL+D) 315-200 MG-UNIT tablet Take 1 tablet by mouth 2 (two) times daily.    . cholecalciferol (VITAMIN D) 1000 units tablet Take 1,000 Units by mouth daily.    Marland Kitchen escitalopram (LEXAPRO) 10 MG tablet Take 10 mg by mouth daily.    . feeding supplement (BOOST HIGH PROTEIN) LIQD Take 1 Container by mouth daily.    . ferrous sulfate 325 (65 FE) MG EC tablet Take 1 tablet (325 mg total) by mouth every other day. (Patient taking differently: Take 325 mg by mouth daily with breakfast. ) 60 tablet 0  . HYDROcodone-acetaminophen (NORCO) 5-325 MG tablet Take 1-2 tablets by mouth every 6 (six) hours as needed. (Patient taking differently: Take 1-2 tablets by mouth every 6 (six) hours as needed for moderate pain. ) 20 tablet 0  . levETIRAcetam (KEPPRA) 500 MG tablet Take 1 tablet (500 mg total) by mouth 2 (two) times daily. 60 tablet 1  . LORazepam (ATIVAN) 1 MG tablet Take 1 tablet (1 mg total) by mouth every 4 (four) hours as needed for anxiety. 30 tablet 0  . methocarbamol (ROBAXIN) 500 MG tablet Take 1 tablet (500 mg total) by mouth every 6 (six) hours as needed for muscle spasms. 30 tablet 0  . Multiple Vitamin (MULTIVITAMIN WITH MINERALS) TABS tablet Take 1 tablet by mouth daily.    Marland Kitchen  omeprazole (PRILOSEC) 20 MG capsule Take 20 mg by mouth daily.    Marland Kitchen PHENobarbital (LUMINAL) 64.8 MG tablet Take 1 tablet (64.8 mg total) by mouth 2 (two) times daily. 60 tablet 1  . potassium chloride SA (K-DUR,KLOR-CON) 20 MEQ tablet Take 20 mEq by mouth daily.    . pregabalin (LYRICA) 150 MG capsule Take 1 capsule (150 mg total) by mouth every 8 (eight) hours. 90 capsule 0  . psyllium (HYDROCIL/METAMUCIL) 95 % PACK Take 1 packet by mouth daily.    Marland Kitchen senna-docusate (SENOKOT-S) 8.6-50 MG tablet Take 1 tablet by mouth at bedtime. 30 tablet 0  . zinc gluconate 50 MG tablet Take 50 mg by mouth daily.     No current  facility-administered medications on file prior to visit.     ALLERGIES: Allergies  Allergen Reactions  . Dust Mite Extract Cough    FAMILY HISTORY: Family History  Problem Relation Age of Onset  . Hip fracture Mother   . Heart Problems Father     SOCIAL HISTORY: Social History   Socioeconomic History  . Marital status: Married    Spouse name: Not on file  . Number of children: Not on file  . Years of education: Not on file  . Highest education level: Not on file  Occupational History  . Not on file  Social Needs  . Financial resource strain: Not on file  . Food insecurity:    Worry: Not on file    Inability: Not on file  . Transportation needs:    Medical: Not on file    Non-medical: Not on file  Tobacco Use  . Smoking status: Never Smoker  . Smokeless tobacco: Never Used  Substance and Sexual Activity  . Alcohol use: No  . Drug use: No  . Sexual activity: Not on file  Lifestyle  . Physical activity:    Days per week: Not on file    Minutes per session: Not on file  . Stress: Not on file  Relationships  . Social connections:    Talks on phone: Not on file    Gets together: Not on file    Attends religious service: Not on file    Active member of club or organization: Not on file    Attends meetings of clubs or organizations: Not on file    Relationship status: Not on file  . Intimate partner violence:    Fear of current or ex partner: Not on file    Emotionally abused: Not on file    Physically abused: Not on file    Forced sexual activity: Not on file  Other Topics Concern  . Not on file  Social History Narrative   ** Merged History Encounter **        REVIEW OF SYSTEMS: Constitutional: No fevers, chills, or sweats, no generalized fatigue, change in appetite Eyes: No visual changes, double vision, eye pain Ear, nose and throat: No hearing loss, ear pain, nasal congestion, sore throat Cardiovascular: No chest pain, palpitations Respiratory:  No  shortness of breath at rest or with exertion, wheezes GastrointestinaI: No nausea, vomiting, diarrhea, abdominal pain, fecal incontinence Genitourinary:  No dysuria, urinary retention or frequency Musculoskeletal:  + neck pain, back pain Integumentary: No rash, pruritus, skin lesions Neurological: as above Psychiatric: No depression, insomnia, anxiety Endocrine: No palpitations, fatigue, diaphoresis, mood swings, change in appetite, change in weight, increased thirst Hematologic/Lymphatic:  No anemia, purpura, petechiae. Allergic/Immunologic: no itchy/runny eyes, nasal congestion, recent allergic reactions, rashes  PHYSICAL EXAM: Vitals:   03/02/18 1247  BP: 110/78  Pulse: 74  SpO2: 98%   General: No acute distress Head:  Normocephalic/atraumatic Eyes: Fundoscopic exam shows bilateral sharp discs, no vessel changes, exudates, or hemorrhages Neck: supple, no paraspinal tenderness, full range of motion Back: No paraspinal tenderness Heart: regular rate and rhythm Lungs: Clear to auscultation bilaterally. Vascular: No carotid bruits. Skin/Extremities: No rash, no edema Neurological Exam: Mental status: alert and oriented to person, place, and time, no dysarthria or aphasia, Fund of knowledge is appropriate.  Recent and remote memory are intact. 3/3 delayed recall. Attention and concentration are normal.    Able to name objects and repeat phrases. Cranial nerves: CN I: not tested CN II: pupils equal, round and reactive to light, visual fields intact, fundi unremarkable. CN III, IV, VI:  full range of motion, no nystagmus, no ptosis CN V: facial sensation intact CN VII: upper and lower face symmetric CN VIII: hearing intact to finger rub CN IX, X: gag intact, uvula midline CN XI: sternocleidomastoid and trapezius muscles intact CN XII: tongue midline Bulk & Tone: normal, no fasciculations. Motor: 5/5 throughout with no pronator drift. Sensation: intact to light touch, cold, pin,  vibration and joint position sense.  No extinction to double simultaneous stimulation.  Romberg test negative Deep Tendon Reflexes: +2 on both UE, +1 both LE, no ankle clonus Plantar responses: downgoing bilaterally Cerebellar: no incoordination on finger to nose testing Gait: slow and cautious with walker, no ataxia Tremor: none  IMPRESSION: This is a pleasant 67 year old right-handed woman with a history of depression, anxiety, focal to bilateral tonic-clonic epilepsy, etiology unclear. MRI brain no acute changes. She was in status epilepticus recently with electrographic seizures arising from the right anterior frontal and paracentral region, then right-sided PLEDs. There were also occasional left posterior tempora/parietal epileptiform discharges seen. They deny any seizures since hospital discharge 2 weeks ago. She is having difficulties remembering the noon dose of Lyrica, we will streamline to BID dosing, take 150mg  BID, continue Keppra 500mg  BID and Phenobarbital 64.8mg  BID. Lyrica may help with headaches as well, she was advised to minimize Fioricet intake to 2-3 times a week to avoid rebound headaches. They do not feel Lexapro is helping as well and will restart Sertraline. Refer to Behavioral Medicine for psychiatry and therapy for depression and anxiety. [place] driving laws were discussed with the patient, and she knows to stop driving after a seizure, until 6 months seizure-free. She will follow-up in 3-4 months and knows to call for any changes.   Thank you for allowing me to participate in the care of this patient. Please do not hesitate to call for any questions or concerns.   Ellouise Newer, M.D.  CC: Dr. Via, Dr. Cruzita Lederer

## 2018-03-21 ENCOUNTER — Telehealth: Payer: Self-pay | Admitting: Neurology

## 2018-04-20 ENCOUNTER — Ambulatory Visit: Payer: Self-pay | Admitting: Neurology

## 2018-05-08 ENCOUNTER — Encounter (HOSPITAL_COMMUNITY): Payer: Self-pay | Admitting: Psychiatry

## 2018-05-08 ENCOUNTER — Ambulatory Visit (INDEPENDENT_AMBULATORY_CARE_PROVIDER_SITE_OTHER): Payer: Commercial Managed Care - PPO | Admitting: Psychiatry

## 2018-05-08 VITALS — BP 120/72 | HR 67 | Ht 58.5 in | Wt 92.4 lb

## 2018-05-08 DIAGNOSIS — F341 Dysthymic disorder: Secondary | ICD-10-CM

## 2018-05-08 DIAGNOSIS — Z62810 Personal history of physical and sexual abuse in childhood: Secondary | ICD-10-CM | POA: Diagnosis not present

## 2018-05-08 MED ORDER — DOXEPIN HCL 10 MG PO CAPS: 10.0000 mg | ORAL_CAPSULE | Freq: Every day | ORAL | 3 refills | Status: DC
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
## 2018-05-11 ENCOUNTER — Ambulatory Visit (HOSPITAL_COMMUNITY): Payer: Self-pay | Admitting: Psychiatry

## 2018-05-30 ENCOUNTER — Ambulatory Visit: Payer: Self-pay | Admitting: Neurology

## 2018-08-21 ENCOUNTER — Telehealth (HOSPITAL_COMMUNITY): Payer: Self-pay

## 2018-08-21 NOTE — Telephone Encounter (Signed)
Patient is calling because she would like to increase the Doxepin to 20 mg a night, patient states that 10 mg is not enough

## 2018-08-23 ENCOUNTER — Other Ambulatory Visit (HOSPITAL_COMMUNITY): Payer: Self-pay

## 2018-08-23 MED ORDER — DOXEPIN HCL 10 MG PO CAPS: 20.0000 mg | ORAL_CAPSULE | Freq: Every day | ORAL | 3 refills | Status: DC

## 2018-08-31 ENCOUNTER — Ambulatory Visit: Payer: Commercial Managed Care - PPO | Admitting: Neurology

## 2018-08-31 ENCOUNTER — Other Ambulatory Visit: Payer: Self-pay

## 2018-08-31 ENCOUNTER — Other Ambulatory Visit: Payer: Self-pay | Admitting: Neurology

## 2018-08-31 ENCOUNTER — Encounter: Payer: Self-pay | Admitting: Neurology

## 2018-08-31 VITALS — BP 110/74 | HR 83 | Ht 60.0 in | Wt 81.0 lb

## 2018-08-31 DIAGNOSIS — G40001 Localization-related (focal) (partial) idiopathic epilepsy and epileptic syndromes with seizures of localized onset, not intractable, with status epilepticus: Secondary | ICD-10-CM | POA: Diagnosis not present

## 2018-08-31 DIAGNOSIS — G43009 Migraine without aura, not intractable, without status migrainosus: Secondary | ICD-10-CM | POA: Diagnosis not present

## 2018-08-31 MED ORDER — PHENOBARBITAL 64.8 MG PO TABS: 64.8000 mg | ORAL_TABLET | Freq: Two times a day (BID) | ORAL | 5 refills | Status: DC

## 2018-08-31 MED ORDER — ELETRIPTAN HYDROBROMIDE 20 MG PO TABS: ORAL_TABLET | ORAL | 11 refills | Status: DC

## 2018-08-31 MED ORDER — PREGABALIN 150 MG PO CAPS: 150.0000 mg | ORAL_CAPSULE | Freq: Two times a day (BID) | ORAL | 5 refills | Status: DC

## 2018-08-31 MED ORDER — LEVETIRACETAM 500 MG PO TABS: 500.0000 mg | ORAL_TABLET | Freq: Two times a day (BID) | ORAL | 11 refills | Status: DC

## 2018-08-31 NOTE — Telephone Encounter (Signed)
Medication goes to [facility] [address] in [place]. Phone for [facility]:  [phone]. Please send all medication there. Thanks! Lyrica, Keppra, butalbital and one new one from today.

## 2018-08-31 NOTE — Telephone Encounter (Signed)
Forwarded to Dr. Aquino for approval 

## 2018-08-31 NOTE — Progress Notes (Signed)
NEUROLOGY FOLLOW UP OFFICE NOTE  Samantha Atkins [id number] [birth year]  HISTORY OF PRESENT ILLNESS: I had the pleasure of seeing Samantha Atkins in follow-up in the neurology clinic on 08/31/2018. She is alone in the office today. The patient was last seen 6 months ago after she was admitted for status epilepticus in June 2019. She was having electrographic seizuresfrom the right anterior frontal and paracentral region, then right-sided PLEDs. There were also occasional left posterior temporal/parietal epileptiform discharges seen. MRI brain no acute changes. She was discharged home on Keppra, Phenobarbital and Lyrica TID. On her initial visit, she reported difficulty with TID dosing and was instructed to take Lyrica 150mg  BID, continue Keppra 500mg  BID, and Phenobarbital 64.8mg  BID. She denies any seizures since her last visit. Her husband thinks she is better than before she had the seizures. She denies any staring/unresponsive episodes, gaps in time, olfactory/gustatory hallucinations, focal numbness/tingling/weakness, myoclonic jerks. She has migraines around 1-2 times a week. She has stopped the Fioricet. She feels the migraines are associated when she has waves of depression. She continues to deal with a lot of depression and sees Dr. Casimiro Needle and a psychotherapist. She is also happy to report she is doing yoga at home. She denies any falls, she only uses her walker outside the house.   History on Initial Assessment 03/02/2018: This is a pleasant 68 year old right-handed woman with a history of osteoporosis, depression, GERD, presenting for hospital follow-up after hospital admission for status epilepticus. Records were reviewed. She was admitted for a fall in July 2018 and was found to have a left hip fracture. She had no recollection of the fall and was getting ready for surgery when she had an episode of staring and unresponsiveness with some hand wringing movements. On further questioning, she had been having  these episodes for several years which her husband had attributed to her underlying psychiatric condition. She has severe depression and anxiety. Her husband reported multiple falls, they were in the bedroom one time and he noted an awkward pause in conversation then she "fell like a tree." No convulsive activity noted. She had an EEG which showed mild to moderate diffuse slowing, no epileptiform discharges seen. CT head showed diffuse cerebral and cerebellar atrophy, chronic microvascular disease. She was discharged on Keppra 500mg  BID. She was brought back to the hospital last 01/29/18 for status epilepticus. Her husband found her partway off the bed unresponsive, foaming at the mouth. She was given IV Versed, then had another seizure and was given IV Ativan. ER noted eyelid twitching and right gaze deviation, as well as right arm and leg twitching. She underwent 5 days of inpatient video EEG monitoring, initial EEG showed focal slowing on the right posterior region with runs of brief rhythmic activity in the left frontal region. She had worsening changes on EEG, with electrographic seizures arising from the right anterior frontal and paracentral region. Several seizure medications were started, with complete resolution of right PLEDs on addition of phenobarbital. There was also occasional left posterior tempora/parietal epileptiform discharges seen. I personally reviewed MRI brain without contrast which did not show any acute changes, there was moderate brain atrophy, mild chronic microvascular disease. She had a lumbar puncture with slightly elevated protein of 78, negative HSV. Over the course of her hospital stay, Keppra and Dilantin were discontinued, she was discharged home on Phenobarbital 60mg  BID and Lyrica 150mg  every 8 hours. Her husband denies any further seizures since hospital discharge 2 weeks ago. Her husband  fills her pillbox but she takes medications herself, occasionally missing the noon dose of  Lyrica. She denies any olfactory/gustatory hallucinations, deja vu, rising epigastric sensation, focal numbness/tingling/weakness, myoclonic jerks. She has tingling in both feet and reports a history of leg length discrepancy. She has a history of migraines with severe migraines 2-3 times a week, lasting 3-5 days. They used to be over the frontal region but lately have been in the back of her head, with nausea, vomiting, photo/phonophobia. She denies any migraine aura or visual obscurations. No family history of migraines. She has a lot of neck pain and shoulder pain. No dizziness, diplopia, dysarthria/dysphagia, bowel/bladder dysfunction. She initially had some urinary incontinence which is better. She has been using her walker since a fall in 2013 where she broke her right leg. She has been dealing with a lot of stress and depression, worsening her headaches. She takes Fioricet every 6 hours as needed and states she does not take it daily. She reports severe depression and insomnia, she reports Sertraline was stopped in the hospital and asks about resuming medication. Melatonin did not help with sleep.   Epilepsy Risk Factors:  She had a normal birth and early development.  There is no history of febrile convulsions, CNS infections such as meningitis/encephalitis, significant traumatic brain injury, neurosurgical procedures, or family history of seizures.  PAST MEDICAL HISTORY: Past Medical History:  Diagnosis Date  . Anxiety   . Arthritis    hands  . Cancer (HCC)    HISTORY OF OVARIAN CANCER  . Depression   . Fracture of humerus, proximal, left, closed 08/07/2015  . Fracture, humerus closed 07-31-15 fell   left  . GERD (gastroesophageal reflux disease)   . Headache   . Insomnia   . Migraines   . Osteoporosis   . S/P right hip fracture 1990   fractures X 2  . Seizures ([place])     MEDICATIONS: Current Outpatient Medications on File Prior to Visit  Medication Sig Dispense Refill  .  butalbital-acetaminophen-caffeine (FIORICET, ESGIC) 50-325-40 MG tablet Take 1 tablet by mouth every 6 (six) hours as needed for migraine. (Patient not taking: Reported on 05/08/2018) 20 tablet 0  . calcium citrate-vitamin D (CITRACAL+D) 315-200 MG-UNIT tablet Take 1 tablet by mouth 2 (two) times daily.    . cholecalciferol (VITAMIN D) 1000 units tablet Take 1,000 Units by mouth daily.    Marland Kitchen doxepin (SINEQUAN) 10 MG capsule Take 2 capsules (20 mg total) by mouth at bedtime. 60 capsule 3  . escitalopram (LEXAPRO) 10 MG tablet Take 10 mg by mouth daily.    . feeding supplement (BOOST HIGH PROTEIN) LIQD Take 1 Container by mouth daily.    . ferrous sulfate 325 (65 FE) MG EC tablet Take 1 tablet (325 mg total) by mouth every other day. (Patient taking differently: Take 325 mg by mouth daily with breakfast. ) 60 tablet 0  . HYDROcodone-acetaminophen (NORCO) 5-325 MG tablet Take 1-2 tablets by mouth every 6 (six) hours as needed. (Patient taking differently: Take 1-2 tablets by mouth every 6 (six) hours as needed for moderate pain. ) 20 tablet 0  . levETIRAcetam (KEPPRA) 500 MG tablet Take 1 tablet (500 mg total) by mouth 2 (two) times daily. 60 tablet 11  . LORazepam (ATIVAN) 1 MG tablet Take 1 tablet (1 mg total) by mouth every 4 (four) hours as needed for anxiety. 30 tablet 0  . methocarbamol (ROBAXIN) 500 MG tablet Take 1 tablet (500 mg total) by mouth every 6 (  six) hours as needed for muscle spasms. 30 tablet 0  . Multiple Vitamin (MULTIVITAMIN WITH MINERALS) TABS tablet Take 1 tablet by mouth daily.    Marland Kitchen omeprazole (PRILOSEC) 20 MG capsule Take 20 mg by mouth daily.    Marland Kitchen PHENobarbital (LUMINAL) 64.8 MG tablet Take 1 tablet (64.8 mg total) by mouth 2 (two) times daily. 60 tablet 5  . potassium chloride SA (K-DUR,KLOR-CON) 20 MEQ tablet Take 20 mEq by mouth daily.    . pregabalin (LYRICA) 150 MG capsule Take 1 capsule (150 mg total) by mouth 2 (two) times daily. 60 capsule 5  . psyllium  (HYDROCIL/METAMUCIL) 95 % PACK Take 1 packet by mouth daily.    Marland Kitchen senna-docusate (SENOKOT-S) 8.6-50 MG tablet Take 1 tablet by mouth at bedtime. 30 tablet 0  . zinc gluconate 50 MG tablet Take 50 mg by mouth daily.     No current facility-administered medications on file prior to visit.     ALLERGIES: Allergies  Allergen Reactions  . Dust Mite Extract Cough    FAMILY HISTORY: Family History  Problem Relation Age of Onset  . Hip fracture Mother   . Heart Problems Father   . Alcohol abuse Father   . Drug abuse Father   . Anxiety disorder Father   . Parkinson's disease Brother   . Healthy Daughter     SOCIAL HISTORY: Social History   Socioeconomic History  . Marital status: Married    Spouse name: Not on file  . Number of children: Not on file  . Years of education: Not on file  . Highest education level: Not on file  Occupational History  . Not on file  Social Needs  . Financial resource strain: Not on file  . Food insecurity:    Worry: Not on file    Inability: Not on file  . Transportation needs:    Medical: Not on file    Non-medical: Not on file  Tobacco Use  . Smoking status: Never Smoker  . Smokeless tobacco: Never Used  Substance and Sexual Activity  . Alcohol use: No  . Drug use: No  . Sexual activity: Not Currently  Lifestyle  . Physical activity:    Days per week: Not on file    Minutes per session: Not on file  . Stress: Not on file  Relationships  . Social connections:    Talks on phone: Not on file    Gets together: Not on file    Attends religious service: Not on file    Active member of club or organization: Not on file    Attends meetings of clubs or organizations: Not on file    Relationship status: Not on file  . Intimate partner violence:    Fear of current or ex partner: Not on file    Emotionally abused: Not on file    Physically abused: Not on file    Forced sexual activity: Not on file  Other Topics Concern  . Not on file    Social History Narrative   ** Merged History Encounter **        REVIEW OF SYSTEMS: Constitutional: No fevers, chills, or sweats, no generalized fatigue, change in appetite Eyes: No visual changes, double vision, eye pain Ear, nose and throat: No hearing loss, ear pain, nasal congestion, sore throat Cardiovascular: No chest pain, palpitations Respiratory:  No shortness of breath at rest or with exertion, wheezes GastrointestinaI: No nausea, vomiting, diarrhea, abdominal pain, fecal incontinence Genitourinary:  No  dysuria, urinary retention or frequency Musculoskeletal:  No neck pain, back pain Integumentary: No rash, pruritus, skin lesions Neurological: as above Psychiatric: No depression, insomnia, anxiety Endocrine: No palpitations, fatigue, diaphoresis, mood swings, change in appetite, change in weight, increased thirst Hematologic/Lymphatic:  No anemia, purpura, petechiae. Allergic/Immunologic: no itchy/runny eyes, nasal congestion, recent allergic reactions, rashes  PHYSICAL EXAM: Vitals:   08/31/18 0946  BP: 110/74  Pulse: 83  SpO2: 97%   General: No acute distress Head:  Normocephalic/atraumatic Neck: supple, no paraspinal tenderness, full range of motion Heart:  Regular rate and rhythm Lungs:  Clear to auscultation bilaterally Back: No paraspinal tenderness Skin/Extremities: No rash, no edema Neurological Exam: alert and oriented to person, place, and time. No aphasia or dysarthria. Fund of knowledge is appropriate.  Recent and remote memory are intact.  Attention and concentration are normal.    Able to name objects and repeat phrases. Cranial nerves: Pupils equal, round, reactive to light. Extraocular movements intact with no nystagmus. Visual fields full. Facial sensation intact. No facial asymmetry. Tongue, uvula, palate midline.  Motor: Bulk and tone normal, muscle strength 5/5 throughout with no pronator drift.  Sensation to light touch intact.  No extinction to  double simultaneous stimulation.  Deep tendon reflexes +1 throughout, toes downgoing.  Finger to nose testing intact.  Gait slow and cautious due to left leg length discrepancy, no ataxia.  IMPRESSION: This is a pleasant 68 yo RH woman with a history of depression, anxiety, focal to bilateral tonic-clonic epilepsy, etiology unclear. MRI brain no acute changes. She was in status epilepticus in June 2019 with electrographic seizures arising from the right anterior frontal and paracentral region, then right-sided PLEDs. There were also occasional left posterior tempora/parietal epileptiform discharges seen. She denies any seizures since June 2019. She is on Keppra 500mg  BID, Phenobarbital 64.8mg  BID, and Lyrica 150mg  BID. Lyrica is also for migraine prophylaxis, she has stopped Fioricet intake after we had discussed rebound headaches. She will try Relpax for migraine rescue. Side effects discussed. Continue follow-up with Behavioral Health. She has not been driving and is aware of [place] driving laws to stop driving after a seizure until 6 months seizure-free. She will follow-up in 6 months, if she continues to do well we will plan to slowly wean off one of her seizure medications to reduce polypharmacy. She knows to call for any changes.   Thank you for allowing me to participate in her care.  Please do not hesitate to call for any questions or concerns.  The duration of this appointment visit was 30 minutes of face-to-face time with the patient.  Greater than 50% of this time was spent in counseling, explanation of diagnosis, planning of further management, and coordination of care.   Ellouise Newer, M.D.   CC: Dr. Sabra Heck

## 2018-08-31 NOTE — Patient Instructions (Signed)
Great seeing you!  1. Continue all your medications. 2. Try the Relpax 20mg : take 1 tablet at onset of migraine, do not take more than 2-3 a week 3. Let us know which pharmacy to send the prescriptions to 4. Follow-up in 6 months, call for any changes  Seizure Precautions: 1. If medication has been prescribed for you to prevent seizures, take it exactly as directed.  Do not stop taking the medicine without talking to your doctor first, even if you have not had a seizure in a long time.   2. Avoid activities in which a seizure would cause danger to yourself or to others.  Don't operate dangerous machinery, swim alone, or climb in high or dangerous places, such as on ladders, roofs, or girders.  Do not drive unless your doctor says you may.  3. If you have any warning that you may have a seizure, lay down in a safe place where you can't hurt yourself.    4.  No driving for 6 months from last seizure, as per [facility].   Please refer to the following link on the [facility] website for more information: [email or web address]   5.  Maintain good sleep hygiene. Avoid alcohol.  6.  Contact your doctor if you have any problems that may be related to the medicine you are taking.  7.  Call 911 and bring the patient back to the ED if:        A.  The seizure lasts longer than 5 minutes.       B.  The patient doesn't awaken shortly after the seizure  C.  The patient has new problems such as difficulty seeing, speaking or moving  D.  The patient was injured during the seizure  E.  The patient has a temperature over 102 F (39C)  F.  The patient vomited and now is having trouble breathing

## 2018-09-04 ENCOUNTER — Other Ambulatory Visit (HOSPITAL_COMMUNITY): Payer: Self-pay | Admitting: Psychiatry

## 2018-09-14 ENCOUNTER — Ambulatory Visit (INDEPENDENT_AMBULATORY_CARE_PROVIDER_SITE_OTHER): Payer: Commercial Managed Care - PPO | Admitting: Psychiatry

## 2018-09-14 ENCOUNTER — Encounter (HOSPITAL_COMMUNITY): Payer: Self-pay | Admitting: Psychiatry

## 2018-09-14 VITALS — BP 129/70 | HR 70 | Ht 60.0 in | Wt 90.0 lb

## 2018-09-14 DIAGNOSIS — F4323 Adjustment disorder with mixed anxiety and depressed mood: Secondary | ICD-10-CM | POA: Diagnosis not present

## 2018-09-14 MED ORDER — ESCITALOPRAM OXALATE 10 MG PO TABS: 10.0000 mg | ORAL_TABLET | Freq: Every day | ORAL | 4 refills | Status: DC

## 2018-09-14 MED ORDER — DOXEPIN HCL 10 MG PO CAPS: 20.0000 mg | ORAL_CAPSULE | Freq: Every day | ORAL | 6 refills | Status: DC

## 2018-09-14 NOTE — Progress Notes (Signed)
Psychiatric Initial Adult Assessment   Patient Identification: Samantha Atkins MRN:  [id number] Date of Evaluation:  09/14/2018 Referral Source: Dr  Michele Mcalpine Chief Complaint:  Chronic depression Visit Diagnosis adjustment disorder with depression  This is the second visit with a 68 year old white female who now is doing much better.  When she came she described moderate dysphoria that did not really affect her functioning.  In essence it was not really all that persistent or pervasive.  She did have a number of associated symptoms including disturbances in sleep.  With the use of doxepin she is much better.  The most important intervention was that she started psychotherapy with Dr. Tomi Bamberger.  The patient sense of worth and value is improved.  Her energy level is better.  Since I saw her she did twist her ankle and now it is wrapped up but fortunately she did not break anything.  Her health overall is stable.  Financially she is stable.  She does have osteoporosis and over the years has had multiple orthopedic procedures.  The patient sees Dr. Delice Lesch for the treatment of her seizures.  Since her last visit she has had no seizures taking Keppra and Lyrica.  The patient rents her home and likes it.  The patient enjoys life.  She has a cat, watches TV and reads a great deal.  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  The patient is not fatigued at all.  She denies any chest pain shortness of breath or any neurological symptoms.  Again noted is that she has not had a seizure since her last visit.    Associated Signs/Symptoms: Depression Symptoms:  depressed mood, (Hypo) Manic Symptoms:   Anxiety Symptoms:   Psychotic Symptoms:   PTSD Symptoms:   Past Psychiatric History: Prozac, Zoloft now on Lexapro 1 psychiatric hospitalization 2 decades ago  Previous Psychotropic Medications: Lexapro 10 mg, Ativan 1 mg as needed  Substance Abuse History in the last 12  months:  No.  Consequences of Substance Abuse:   Past Medical History:  Past Medical History:  Diagnosis Date  . Anxiety   . Arthritis    hands  . Cancer (HCC)    HISTORY OF OVARIAN CANCER  . Depression   . Fracture of humerus, proximal, left, closed 08/07/2015  . Fracture, humerus closed 07-31-15 fell   left  . GERD (gastroesophageal reflux disease)   . Headache   . Insomnia   . Migraines   . Osteoporosis   . S/P right hip fracture 1990   fractures X 2  . Seizures ([place])     Past Surgical History:  Procedure Laterality Date  . ABDOMINAL HYSTERECTOMY    . FEMUR FRACTURE SURGERY Left    fell, redo hip replacement  . JOINT REPLACEMENT Bilateral    hip  . ORIF DISTAL RADIUS FRACTURE Left 03-2015  . ORIF HUMERUS FRACTURE Left 08/07/2015   Procedure: LEFT OPEN REDUCTION INTERNAL FIXATION (ORIF) PROXIMAL HUMERUS FRACTURE;  Surgeon: Marchia Bond, MD;  Location: [facility];  Service: Orthopedics;  Laterality: Left;  . TONSILLECTOMY    . TOTAL HIP REVISION Left 02/14/2017   Procedure: LEFT ORIF PERIPROSTHETIC FEMORAL FRACTURE AND REVISION HIP ARTHROPLASTY;  Surgeon: Paralee Cancel, MD;  Location: [facility];  Service: Orthopedics;  Laterality: Left;    Family Psychiatric History:   Family History:  Family History  Problem Relation Age of Onset  . Hip fracture Mother   .  Heart Problems Father   . Alcohol abuse Father   . Drug abuse Father   . Anxiety disorder Father   . Parkinson's disease Brother   . Healthy Daughter     Social History:   Social History   Socioeconomic History  . Marital status: Married    Spouse name: Not on file  . Number of children: Not on file  . Years of education: Not on file  . Highest education level: Not on file  Occupational History  . Not on file  Social Needs  . Financial resource strain: Not on file  . Food insecurity:    Worry: Not on file    Inability: Not on file  . Transportation needs:    Medical: Not on file     Non-medical: Not on file  Tobacco Use  . Smoking status: Never Smoker  . Smokeless tobacco: Never Used  Substance and Sexual Activity  . Alcohol use: No  . Drug use: No  . Sexual activity: Not Currently  Lifestyle  . Physical activity:    Days per week: Not on file    Minutes per session: Not on file  . Stress: Not on file  Relationships  . Social connections:    Talks on phone: Not on file    Gets together: Not on file    Attends religious service: Not on file    Active member of club or organization: Not on file    Attends meetings of clubs or organizations: Not on file    Relationship status: Not on file  Other Topics Concern  . Not on file  Social History Narrative   ** Merged History Encounter **        Additional Social History:   Allergies:   Allergies  Allergen Reactions  . Dust Mite Extract Cough    Metabolic Disorder Labs: No results found for: HGBA1C, MPG No results found for: PROLACTIN No results found for: CHOL, TRIG, HDL, CHOLHDL, VLDL, LDLCALC   Current Medications: Current Outpatient Medications  Medication Sig Dispense Refill  . butalbital-acetaminophen-caffeine (FIORICET, ESGIC) 50-325-40 MG tablet Take 1 tablet by mouth every 6 (six) hours as needed for migraine. 20 tablet 0  . calcium citrate-vitamin D (CITRACAL+D) 315-200 MG-UNIT tablet Take 1 tablet by mouth 2 (two) times daily.    . cholecalciferol (VITAMIN D) 1000 units tablet Take 1,000 Units by mouth daily.    Marland Kitchen doxepin (SINEQUAN) 10 MG capsule Take 2 capsules (20 mg total) by mouth at bedtime. 60 capsule 6  . eletriptan (RELPAX) 20 MG tablet Take 1 tablet at onset of migraine. Do not take more than 2-3 a week 10 tablet 11  . escitalopram (LEXAPRO) 10 MG tablet Take 1 tablet (10 mg total) by mouth daily. 30 tablet 4  . feeding supplement (BOOST HIGH PROTEIN) LIQD Take 1 Container by mouth daily.    Marland Kitchen HYDROcodone-acetaminophen (NORCO) 5-325 MG tablet Take 1-2 tablets by mouth every 6  (six) hours as needed. (Patient taking differently: Take 1-2 tablets by mouth every 6 (six) hours as needed for moderate pain. ) 20 tablet 0  . levETIRAcetam (KEPPRA) 500 MG tablet Take 1 tablet (500 mg total) by mouth 2 (two) times daily. 60 tablet 11  . LORazepam (ATIVAN) 1 MG tablet Take 1 tablet (1 mg total) by mouth every 4 (four) hours as needed for anxiety. 30 tablet 0  . methocarbamol (ROBAXIN) 500 MG tablet Take 1 tablet (500 mg total) by mouth every 6 (six) hours  as needed for muscle spasms. 30 tablet 0  . Multiple Vitamin (MULTIVITAMIN WITH MINERALS) TABS tablet Take 1 tablet by mouth daily.    Marland Kitchen omeprazole (PRILOSEC) 20 MG capsule Take 20 mg by mouth daily.    Marland Kitchen PHENobarbital (LUMINAL) 64.8 MG tablet Take 1 tablet (64.8 mg total) by mouth 2 (two) times daily. 60 tablet 5  . potassium chloride SA (K-DUR,KLOR-CON) 20 MEQ tablet Take 20 mEq by mouth daily.    . pregabalin (LYRICA) 150 MG capsule Take 1 capsule (150 mg total) by mouth 2 (two) times daily. 60 capsule 5  . psyllium (HYDROCIL/METAMUCIL) 95 % PACK Take 1 packet by mouth daily.    Marland Kitchen senna-docusate (SENOKOT-S) 8.6-50 MG tablet Take 1 tablet by mouth at bedtime. 30 tablet 0  . zinc gluconate 50 MG tablet Take 50 mg by mouth daily.    . ferrous sulfate 325 (65 FE) MG EC tablet Take 1 tablet (325 mg total) by mouth every other day. (Patient taking differently: Take 325 mg by mouth daily with breakfast. ) 60 tablet 0   No current facility-administered medications for this visit.     Neurologic: Headache: No Seizure: Yes Paresthesias:No  Musculoskeletal: Strength & Muscle Tone: decreased Gait & Station: unable to stand Patient leans: N/A  Psychiatric Specialty Exam: ROS  Blood pressure 129/70, pulse 70, height 5' (1.524 m), weight 90 lb (40.8 kg), SpO2 99 %.Body mass index is 17.58 kg/m.  General Appearance: Fairly Groomed  Eye Contact:    Speech:  Clear and Coherent  Volume:  Normal  Mood:  Anxious  Affect:   Congruent  Thought Process:  Goal Directed  Orientation:  Full (Time, Place, and Person)  Thought Content:  Logical  Suicidal Thoughts:  No  Homicidal Thoughts:  No  Memory:  NA  Judgement:  Good  Insight:  Good  Psychomotor Activity:  Normal  Concentration:    Recall:  [place] of Knowledge:Good  Language: Good  Akathisia:  No  Handed:  Right  AIMS (if indicated):    Assets:  Desire for Improvement  ADL's:  Intact  Cognition: WNL  Sleep:      Treatment Plan Summary:  At this time this patient has 2 stable problems.  She has adjustment disorder with a depressed mood stating and takes Lexapro.  I truly doubt Lexapro does much more than a psychological benefit.  The most important intervention for her adjustment disorder is being in psychotherapy with an excellent therapist.  She is made an excellent connection with his therapist.  Her second problem is insomnia which is much better now taking doxepin 20 mg.  Overall plan is been successful in getting into therapy and getting on a sleeping aid that gets her more than 6 hours of sleep.  Because she gets a good amount of sleep her concentration is better and her energy is improved.  The patient certainly is not suicidal.  She is functioning very well.  She lives with her husband who has some cardiovascular problems but overall is generally stable.  The patient is very well and will return to see me in 5 months for a 30-minute visit then.  Her symptomatology would be considered minimal at this time.   Jerral Ralph, MD 2/7/202011:22 AM

## 2018-10-23 ENCOUNTER — Other Ambulatory Visit (HOSPITAL_COMMUNITY): Payer: Self-pay | Admitting: Psychiatry

## 2018-11-20 ENCOUNTER — Other Ambulatory Visit (HOSPITAL_COMMUNITY): Payer: Self-pay | Admitting: Psychiatry

## 2019-01-11 ENCOUNTER — Other Ambulatory Visit: Payer: Self-pay

## 2019-01-11 ENCOUNTER — Other Ambulatory Visit: Payer: Self-pay | Admitting: Neurology

## 2019-01-11 ENCOUNTER — Telehealth (INDEPENDENT_AMBULATORY_CARE_PROVIDER_SITE_OTHER): Payer: Commercial Managed Care - PPO | Admitting: Neurology

## 2019-01-11 DIAGNOSIS — G40001 Localization-related (focal) (partial) idiopathic epilepsy and epileptic syndromes with seizures of localized onset, not intractable, with status epilepticus: Secondary | ICD-10-CM

## 2019-01-11 DIAGNOSIS — G43009 Migraine without aura, not intractable, without status migrainosus: Secondary | ICD-10-CM

## 2019-01-11 MED ORDER — PREGABALIN 150 MG PO CAPS: 150.0000 mg | ORAL_CAPSULE | Freq: Two times a day (BID) | ORAL | 5 refills | Status: DC

## 2019-01-11 MED ORDER — LEVETIRACETAM 500 MG PO TABS: 500.0000 mg | ORAL_TABLET | Freq: Two times a day (BID) | ORAL | 11 refills | Status: DC

## 2019-01-11 MED ORDER — ELETRIPTAN HYDROBROMIDE 20 MG PO TABS: ORAL_TABLET | ORAL | 11 refills | Status: DC

## 2019-01-11 MED ORDER — PHENOBARBITAL 64.8 MG PO TABS: 64.8000 mg | ORAL_TABLET | Freq: Two times a day (BID) | ORAL | 5 refills | Status: DC

## 2019-01-11 NOTE — Progress Notes (Signed)
Virtual Visit via Telephone Note The purpose of this virtual visit is to provide medical care while limiting exposure to the novel coronavirus.    Consent was obtained for phone visit:  Yes.   Answered questions that patient had about telehealth interaction:  Yes.   I discussed the limitations, risks, security and privacy concerns of performing an evaluation and management service by telephone. I also discussed with the patient that there may be a patient responsible charge related to this service. The patient expressed understanding and agreed to proceed.  Pt location: Home Physician Location: office Name of referring provider:  Via, Lennette Bihari, MD I connected with .Samantha Atkins at patients initiation/request on 01/11/2019 at 11:30 AM EDT by telephone and verified that I am speaking with the correct person using two identifiers.  Pt MRN:  [id number] Pt DOB:  Oct 30, 1950   History of Present Illness:  The patient was last seen in January 2020 for seizures and migraines. Since her last visit, she continues to do well with no seizures or seizure-like symptoms since June 2019. She was admitted for status epilepticus at that time, with electrographic seizures from the right anterior frontal and paracentral region. There were also occasional left posterior temporal/parietal epileptiform discharges interictally. She required multiple AEDs for seizure control, and has been on Keppra 500mg  BID, Phenobarbital 64.8mg  BID, and Lyrica 150mg  BID for the past year with no side effects. She is in good spirits and denies any staring/unresponsive episodes, gaps in time, olfactory/gustatory hallucinations, focal numbness/tingling/weakness, myoclonic jerks. Her migraines are not so bad, she has a migraine every 1-2 weeks, and has found that Relpax helps. She usually goes in a dark room and tries to relax. She continues to do yoga. Her husband helps with medications. She denies any falls.   History on Initial Assessment  03/02/2018: This is a pleasant 68 year old right-handed woman with a history of osteoporosis, depression, GERD, presenting for hospital follow-up after hospital admission for status epilepticus. Records were reviewed. She was admitted for a fall in July 2018 and was found to have a left hip fracture. She had no recollection of the fall and was getting ready for surgery when she had an episode of staring and unresponsiveness with some hand wringing movements. On further questioning, she had been having these episodes for several years which her husband had attributed to her underlying psychiatric condition. She has severe depression and anxiety. Her husband reported multiple falls, they were in the bedroom one time and he noted an awkward pause in conversation then she "fell like a tree." No convulsive activity noted. She had an EEG which showed mild to moderate diffuse slowing, no epileptiform discharges seen. CT head showed diffuse cerebral and cerebellar atrophy, chronic microvascular disease. She was discharged on Keppra 500mg  BID. She was brought back to the hospital last 01/29/18 for status epilepticus. Her husband found her partway off the bed unresponsive, foaming at the mouth. She was given IV Versed, then had another seizure and was given IV Ativan. ER noted eyelid twitching and right gaze deviation, as well as right arm and leg twitching. She underwent 5 days of inpatient video EEG monitoring, initial EEG showed focal slowing on the right posterior region with runs of brief rhythmic activity in the left frontal region. She had worsening changes on EEG, with electrographic seizures arising from the right anterior frontal and paracentral region. Several seizure medications were started, with complete resolution of right PLEDs on addition of phenobarbital. There was  also occasional left posterior tempora/parietal epileptiform discharges seen. I personally reviewed MRI brain without contrast which did not show  any acute changes, there was moderate brain atrophy, mild chronic microvascular disease. She had a lumbar puncture with slightly elevated protein of 78, negative HSV. Over the course of her hospital stay, Keppra and Dilantin were discontinued, she was discharged home on Phenobarbital 60mg  BID and Lyrica 150mg  every 8 hours. Her husband denies any further seizures since hospital discharge 2 weeks ago. Her husband fills her pillbox but she takes medications herself, occasionally missing the noon dose of Lyrica. She denies any olfactory/gustatory hallucinations, deja vu, rising epigastric sensation, focal numbness/tingling/weakness, myoclonic jerks. She has tingling in both feet and reports a history of leg length discrepancy. She has a history of migraines with severe migraines 2-3 times a week, lasting 3-5 days. They used to be over the frontal region but lately have been in the back of her head, with nausea, vomiting, photo/phonophobia. She denies any migraine aura or visual obscurations. No family history of migraines. She has a lot of neck pain and shoulder pain. No dizziness, diplopia, dysarthria/dysphagia, bowel/bladder dysfunction. She initially had some urinary incontinence which is better. She has been using her walker since a fall in 2013 where she broke her right leg. She has been dealing with a lot of stress and depression, worsening her headaches. She takes Fioricet every 6 hours as needed and states she does not take it daily. She reports severe depression and insomnia, she reports Sertraline was stopped in the hospital and asks about resuming medication. Melatonin did not help with sleep.   Epilepsy Risk Factors:  She had a normal birth and early development.  There is no history of febrile convulsions, CNS infections such as meningitis/encephalitis, significant traumatic brain injury, neurosurgical procedures, or family history of seizures.   Observations/Objective:  Limited due to nature of phone  visit. Patient is awake, alert, pleasant, able to answer questions correctly with no dysarthria noted.   Assessment and Plan:   This is a pleasant 68 yo RH woman with a history of depression, anxiety, focal to bilateral tonic-clonic epilepsy, etiology unclear. MRI brain no acute changes. She was in status epilepticus in June 2019 with electrographic seizures arising from the right anterior frontal and paracentral region, then right-sided PLEDs. There were also occasional left posterior temporal/parietal epileptiform discharges seen. She continues to do well with no seizures since June 2019, no side effects. She is on 3 AEDs, we discussed continuation of Keppra 500mg  BID, Phenobarbital 64.8mg  BID, and Lyrica 150mg  BID for now, with plans to wean off Keppra on her next visit. Lyrica is also for migraine prophylaxis, she has prn Relpax for rescue. Continue follow-up with Behavioral Health. She has not been driving and is aware of [place] driving laws to stop driving after a seizure until 6 months seizure-free. She will follow-up in 4-6 months and knows to call for any changes.   Follow Up Instructions:   -I discussed the assessment and treatment plan with the patient. The patient was provided an opportunity to ask questions and all were answered. The patient agreed with the plan and demonstrated an understanding of the instructions.   The patient was advised to call back or seek an in-person evaluation if the symptoms worsen or if the condition fails to improve as anticipated.    Total Time spent in visit with the patient was:  10 minutes, of which 100% of the time was spent in counseling and/or coordinating  care on the above.   Pt understands and agrees with the plan of care outlined.     Cameron Sprang, MD

## 2019-01-17 ENCOUNTER — Ambulatory Visit: Payer: Self-pay | Admitting: Neurology

## 2019-01-18 ENCOUNTER — Encounter

## 2019-01-18 ENCOUNTER — Ambulatory Visit: Payer: Self-pay | Admitting: Neurology

## 2019-02-22 ENCOUNTER — Ambulatory Visit (INDEPENDENT_AMBULATORY_CARE_PROVIDER_SITE_OTHER): Payer: Commercial Managed Care - PPO | Admitting: Psychiatry

## 2019-02-22 ENCOUNTER — Other Ambulatory Visit: Payer: Self-pay

## 2019-02-22 DIAGNOSIS — F324 Major depressive disorder, single episode, in partial remission: Secondary | ICD-10-CM | POA: Diagnosis not present

## 2019-02-22 MED ORDER — ESCITALOPRAM OXALATE 20 MG PO TABS: 10.0000 mg | ORAL_TABLET | Freq: Every day | ORAL | 4 refills | Status: DC

## 2019-02-22 MED ORDER — DOXEPIN HCL 10 MG PO CAPS: 20.0000 mg | ORAL_CAPSULE | Freq: Every day | ORAL | 6 refills | Status: DC

## 2019-02-22 NOTE — Progress Notes (Signed)
Psychiatric Initial Adult Assessment   Patient Identification: Samantha Atkins MRN:  [id number] Date of Evaluation:  02/22/2019 Referral Source: Dr  Michele Mcalpine Chief Complaint:  Chronic depression Visit Diagnosis adjustment disorder with depression  Unfortunately there was some difficulty getting to this patient.  There were technical difficulties with her phone.  We had a short focus visit.  But if that is that the patient shared with me that in the last day or 2 she is been developing an increasing fever and malaise.  She will contact her primary care doctor.  Her temperature this morning was 99.0.  She also has been coughing.  The patient says for the last 1 week she has been feeling depressed.  Is on a daily basis.  In a close evaluation while she had denied episodes of depression on her last visits she now revealed an episode in her early 60s where she was depressed for over a month.  At that time it afflicted her sleep appetite and energy.  She lost her ability to enjoy.  She was not suicidal.  Unfortunately she sought no treatment.  This seems to be a clear episode of major depression.  It is noted that she is on low-dose Lexapro at this time.  She was having difficulty sleeping but this seems to be much better now that she is taking doxepin.  At this time even though she has had his past episode of depression years ago and she is feeling depressed daily her sleep and appetite are stable.  She still functioning quite well.  She does yoga.   Associated Signs/Symptoms: Depression Symptoms:  depressed mood, (Hypo) Manic Symptoms:   Anxiety Symptoms:   Psychotic Symptoms:   PTSD Symptoms:   Past Psychiatric History: Prozac, Zoloft now on Lexapro 1 psychiatric hospitalization 2 decades ago  Previous Psychotropic Medications: Lexapro 10 mg, Ativan 1 mg as needed  Substance Abuse History in the last 12 months:  No.  Consequences of Substance Abuse:   Past Medical History:  Past Medical History:   Diagnosis Date  . Anxiety   . Arthritis    hands  . Cancer (HCC)    HISTORY OF OVARIAN CANCER  . Depression   . Fracture of humerus, proximal, left, closed 08/07/2015  . Fracture, humerus closed 07-31-15 fell   left  . GERD (gastroesophageal reflux disease)   . Headache   . Insomnia   . Migraines   . Osteoporosis   . S/P right hip fracture 1990   fractures X 2  . Seizures ([place])     Past Surgical History:  Procedure Laterality Date  . ABDOMINAL HYSTERECTOMY    . FEMUR FRACTURE SURGERY Left    fell, redo hip replacement  . JOINT REPLACEMENT Bilateral    hip  . ORIF DISTAL RADIUS FRACTURE Left 03-2015  . ORIF HUMERUS FRACTURE Left 08/07/2015   Procedure: LEFT OPEN REDUCTION INTERNAL FIXATION (ORIF) PROXIMAL HUMERUS FRACTURE;  Surgeon: Marchia Bond, MD;  Location: [place] [facility];  Service: Orthopedics;  Laterality: Left;  . TONSILLECTOMY    . TOTAL HIP REVISION Left 02/14/2017   Procedure: LEFT ORIF PERIPROSTHETIC FEMORAL FRACTURE AND REVISION HIP ARTHROPLASTY;  Surgeon: Paralee Cancel, MD;  Location: [facility];  Service: Orthopedics;  Laterality: Left;    Family Psychiatric History:   Family History:  Family History  Problem Relation Age of Onset  . Hip fracture Mother   . Heart Problems Father   . Alcohol abuse Father   . Drug abuse Father   .  Anxiety disorder Father   . Parkinson's disease Brother   . Healthy Daughter     Social History:   Social History   Socioeconomic History  . Marital status: Married    Spouse name: Not on file  . Number of children: Not on file  . Years of education: Not on file  . Highest education level: Not on file  Occupational History  . Not on file  Social Needs  . Financial resource strain: Not on file  . Food insecurity    Worry: Not on file    Inability: Not on file  . Transportation needs    Medical: Not on file    Non-medical: Not on file  Tobacco Use  . Smoking status: Never Smoker  . Smokeless tobacco:  Never Used  Substance and Sexual Activity  . Alcohol use: No  . Drug use: No  . Sexual activity: Not Currently  Lifestyle  . Physical activity    Days per week: Not on file    Minutes per session: Not on file  . Stress: Not on file  Relationships  . Social Herbalist on phone: Not on file    Gets together: Not on file    Attends religious service: Not on file    Active member of club or organization: Not on file    Attends meetings of clubs or organizations: Not on file    Relationship status: Not on file  Other Topics Concern  . Not on file  Social History Narrative   ** Merged History Encounter **        Additional Social History:   Allergies:   Allergies  Allergen Reactions  . Dust Mite Extract Cough    Metabolic Disorder Labs: No results found for: HGBA1C, MPG No results found for: PROLACTIN No results found for: CHOL, TRIG, HDL, CHOLHDL, VLDL, LDLCALC   Current Medications: Current Outpatient Medications  Medication Sig Dispense Refill  . calcium citrate-vitamin D (CITRACAL+D) 315-200 MG-UNIT tablet Take 1 tablet by mouth 2 (two) times daily.    . cholecalciferol (VITAMIN D) 1000 units tablet Take 1,000 Units by mouth daily.    Marland Kitchen doxepin (SINEQUAN) 10 MG capsule Take 2 capsules (20 mg total) by mouth at bedtime. 60 capsule 6  . eletriptan (RELPAX) 20 MG tablet Take 1 tablet at onset of migraine. Do not take more than 2-3 a week 10 tablet 11  . escitalopram (LEXAPRO) 20 MG tablet Take 0.5 tablets (10 mg total) by mouth daily. 30 tablet 4  . feeding supplement (BOOST HIGH PROTEIN) LIQD Take 1 Container by mouth daily.    . ferrous sulfate 325 (65 FE) MG EC tablet Take 1 tablet (325 mg total) by mouth every other day. (Patient taking differently: Take 325 mg by mouth daily with breakfast. ) 60 tablet 0  . levETIRAcetam (KEPPRA) 500 MG tablet Take 1 tablet (500 mg total) by mouth 2 (two) times daily. 60 tablet 11  . LORazepam (ATIVAN) 1 MG tablet Take 1  tablet (1 mg total) by mouth every 4 (four) hours as needed for anxiety. (Patient not taking: Reported on 01/10/2019) 30 tablet 0  . Multiple Vitamin (MULTIVITAMIN WITH MINERALS) TABS tablet Take 1 tablet by mouth daily.    Marland Kitchen omeprazole (PRILOSEC) 20 MG capsule Take 20 mg by mouth daily.    Marland Kitchen PHENobarbital (LUMINAL) 64.8 MG tablet Take 1 tablet (64.8 mg total) by mouth 2 (two) times daily. 60 tablet 5  . potassium chloride  SA (K-DUR,KLOR-CON) 20 MEQ tablet Take 20 mEq by mouth daily.    . pregabalin (LYRICA) 150 MG capsule Take 1 capsule (150 mg total) by mouth 2 (two) times daily. 60 capsule 5  . psyllium (HYDROCIL/METAMUCIL) 95 % PACK Take 1 packet by mouth daily.    Marland Kitchen senna-docusate (SENOKOT-S) 8.6-50 MG tablet Take 1 tablet by mouth at bedtime. 30 tablet 0   No current facility-administered medications for this visit.     Neurologic: Headache: No Seizure: Yes Paresthesias:No  Musculoskeletal: Strength & Muscle Tone: decreased Gait & Station: unable to stand Patient leans: N/A  Psychiatric Specialty Exam: ROS  There were no vitals taken for this visit.There is no height or weight on file to calculate BMI.  General Appearance: Fairly Groomed  Eye Contact:    Speech:  Clear and Coherent  Volume:  Normal  Mood:  Anxious  Affect:  Congruent  Thought Process:  Goal Directed  Orientation:  Full (Time, Place, and Person)  Thought Content:  Logical  Suicidal Thoughts:  No  Homicidal Thoughts:  No  Memory:  NA  Judgement:  Good  Insight:  Good  Psychomotor Activity:  Normal  Concentration:    Recall:  [place] of Knowledge:Good  Language: Good  Akathisia:  No  Handed:  Right  AIMS (if indicated):    Assets:  Desire for Improvement  ADL's:  Intact  Cognition: WNL  Sleep:      Treatment Plan Summary:  At this time her #1 problem seems really to be major depression residual.  At this time we will go ahead and increase her Lexapro from a dose of 10 mg to a higher dose of  20.  Her second problem is insomnia.  She is taking doxepin and getting a good response.  Patient clearly has something going on medically will contact her primary care doctor.  Patient is not suicidal.  Overall she is functioning fairly well.  She will get a return appointment to see me in 2 months.   Jerral Ralph, MD 7/17/202010:49 AM

## 2019-02-22 NOTE — Telephone Encounter (Signed)
error 

## 2019-05-02 ENCOUNTER — Ambulatory Visit (INDEPENDENT_AMBULATORY_CARE_PROVIDER_SITE_OTHER): Payer: Commercial Managed Care - PPO | Admitting: Psychiatry

## 2019-05-02 ENCOUNTER — Other Ambulatory Visit: Payer: Self-pay

## 2019-05-02 DIAGNOSIS — F324 Major depressive disorder, single episode, in partial remission: Secondary | ICD-10-CM

## 2019-05-02 MED ORDER — HYDROXYZINE PAMOATE 25 MG PO CAPS: ORAL_CAPSULE | ORAL | 5 refills | Status: DC

## 2019-05-02 NOTE — Progress Notes (Signed)
Pain management  Psychiatric Initial Adult Assessment   Patient Identification: Samantha Atkins MRN:  [id number] Date of Evaluation:  05/02/2019 Referral Source: Dr  Michele Mcalpine Chief Complaint:  Chronic depression Visit Diagnosis adjustment disorder with depression  Today the patient seems to be distinctly different.  She is slow mentating.  Her husband reports essentially apraxias.  The patient seems to be having trouble functioning in a cognitive way.  This is definitely different than her last visit.  She is much less articulate.  She complains of extreme fatigue and body pain.  When I interviewed her on her last visit she had a fever or malaise and was recommended to go to see her primary care doctor.  She never went.  She is much more fatigued at this time and her language production is dramatically different.  She is having problems with thinking and concentrating.  She says she has chronic depression which is not much different.  Her husband, Samantha Atkins who I spoke to works at home and says that she has some days like this where she is really out of it.  He does not seem to imply that this is that much different than her baseline but I have never heard her in the way she was today.  She has an upcoming appointment with Dr. Delice Lesch her neurologist within a month.  The patient denies shortness of breath or temperature at this time.  She has no cough.  She has an erratic headache.  She denies any distinct focal neurological complaints.  She has no visual changes.  It is noted that she had a [place] seizure last year.  This is according to her.  The patient is not suicidal.  Associated Signs/Symptoms: Depression Symptoms:  depressed mood, (Hypo) Manic Symptoms:   Anxiety Symptoms:   Psychotic Symptoms:   PTSD Symptoms:   Past Psychiatric History: Prozac, Zoloft now on Lexapro 1 psychiatric hospitalization 2 decades ago  Previous Psychotropic Medications: Lexapro 10 mg, Ativan 1 mg as needed  Substance  Abuse History in the last 12 months:  No.  Consequences of Substance Abuse:   Past Medical History:  Past Medical History:  Diagnosis Date  . Anxiety   . Arthritis    hands  . Cancer (HCC)    HISTORY OF OVARIAN CANCER  . Depression   . Fracture of humerus, proximal, left, closed 08/07/2015  . Fracture, humerus closed 07-31-15 fell   left  . GERD (gastroesophageal reflux disease)   . Headache   . Insomnia   . Migraines   . Osteoporosis   . S/P right hip fracture 1990   fractures X 2  . Seizures ([place])     Past Surgical History:  Procedure Laterality Date  . ABDOMINAL HYSTERECTOMY    . FEMUR FRACTURE SURGERY Left    fell, redo hip replacement  . JOINT REPLACEMENT Bilateral    hip  . ORIF DISTAL RADIUS FRACTURE Left 03-2015  . ORIF HUMERUS FRACTURE Left 08/07/2015   Procedure: LEFT OPEN REDUCTION INTERNAL FIXATION (ORIF) PROXIMAL HUMERUS FRACTURE;  Surgeon: Marchia Bond, MD;  Location: [facility];  Service: Orthopedics;  Laterality: Left;  . TONSILLECTOMY    . TOTAL HIP REVISION Left 02/14/2017   Procedure: LEFT ORIF PERIPROSTHETIC FEMORAL FRACTURE AND REVISION HIP ARTHROPLASTY;  Surgeon: Paralee Cancel, MD;  Location: [facility];  Service: Orthopedics;  Laterality: Left;    Family Psychiatric History:   Family History:  Family History  Problem Relation Age of Onset  .  Hip fracture Mother   . Heart Problems Father   . Alcohol abuse Father   . Drug abuse Father   . Anxiety disorder Father   . Parkinson's disease Brother   . Healthy Daughter     Social History:   Social History   Socioeconomic History  . Marital status: Married    Spouse name: Not on file  . Number of children: Not on file  . Years of education: Not on file  . Highest education level: Not on file  Occupational History  . Not on file  Social Needs  . Financial resource strain: Not on file  . Food insecurity    Worry: Not on file    Inability: Not on file  . Transportation needs     Medical: Not on file    Non-medical: Not on file  Tobacco Use  . Smoking status: Never Smoker  . Smokeless tobacco: Never Used  Substance and Sexual Activity  . Alcohol use: No  . Drug use: No  . Sexual activity: Not Currently  Lifestyle  . Physical activity    Days per week: Not on file    Minutes per session: Not on file  . Stress: Not on file  Relationships  . Social Herbalist on phone: Not on file    Gets together: Not on file    Attends religious service: Not on file    Active member of club or organization: Not on file    Attends meetings of clubs or organizations: Not on file    Relationship status: Not on file  Other Topics Concern  . Not on file  Social History Narrative   ** Merged History Encounter **        Additional Social History:   Allergies:   Allergies  Allergen Reactions  . Dust Mite Extract Cough    Metabolic Disorder Labs: No results found for: HGBA1C, MPG No results found for: PROLACTIN No results found for: CHOL, TRIG, HDL, CHOLHDL, VLDL, LDLCALC   Current Medications: Current Outpatient Medications  Medication Sig Dispense Refill  . calcium citrate-vitamin D (CITRACAL+D) 315-200 MG-UNIT tablet Take 1 tablet by mouth 2 (two) times daily.    . cholecalciferol (VITAMIN D) 1000 units tablet Take 1,000 Units by mouth daily.    Marland Kitchen doxepin (SINEQUAN) 10 MG capsule Take 2 capsules (20 mg total) by mouth at bedtime. 60 capsule 6  . eletriptan (RELPAX) 20 MG tablet Take 1 tablet at onset of migraine. Do not take more than 2-3 a week 10 tablet 11  . escitalopram (LEXAPRO) 20 MG tablet Take 0.5 tablets (10 mg total) by mouth daily. 30 tablet 4  . feeding supplement (BOOST HIGH PROTEIN) LIQD Take 1 Container by mouth daily.    . ferrous sulfate 325 (65 FE) MG EC tablet Take 1 tablet (325 mg total) by mouth every other day. (Patient taking differently: Take 325 mg by mouth daily with breakfast. ) 60 tablet 0  . hydrOXYzine (VISTARIL) 25 MG  capsule 1  qhs 30 capsule 5  . levETIRAcetam (KEPPRA) 500 MG tablet Take 1 tablet (500 mg total) by mouth 2 (two) times daily. 60 tablet 11  . LORazepam (ATIVAN) 1 MG tablet Take 1 tablet (1 mg total) by mouth every 4 (four) hours as needed for anxiety. (Patient not taking: Reported on 01/10/2019) 30 tablet 0  . Multiple Vitamin (MULTIVITAMIN WITH MINERALS) TABS tablet Take 1 tablet by mouth daily.    Marland Kitchen omeprazole (PRILOSEC)  20 MG capsule Take 20 mg by mouth daily.    Marland Kitchen PHENobarbital (LUMINAL) 64.8 MG tablet Take 1 tablet (64.8 mg total) by mouth 2 (two) times daily. 60 tablet 5  . potassium chloride SA (K-DUR,KLOR-CON) 20 MEQ tablet Take 20 mEq by mouth daily.    . pregabalin (LYRICA) 150 MG capsule Take 1 capsule (150 mg total) by mouth 2 (two) times daily. 60 capsule 5  . psyllium (HYDROCIL/METAMUCIL) 95 % PACK Take 1 packet by mouth daily.    Marland Kitchen senna-docusate (SENOKOT-S) 8.6-50 MG tablet Take 1 tablet by mouth at bedtime. 30 tablet 0   No current facility-administered medications for this visit.     Neurologic: Headache: No Seizure: Yes Paresthesias:No  Musculoskeletal: Strength & Muscle Tone: decreased Gait & Station: unable to stand Patient leans: N/A  Psychiatric Specialty Exam: ROS  There were no vitals taken for this visit.There is no height or weight on file to calculate BMI.  General Appearance: Fairly Groomed  Eye Contact:    Speech:  Clear and Coherent  Volume:  Normal  Mood:  Anxious  Affect:  Congruent  Thought Process:  Goal Directed  Orientation:  Full (Time, Place, and Person)  Thought Content:  Logical  Suicidal Thoughts:  No  Homicidal Thoughts:  No  Memory:  NA  Judgement:  Good  Insight:  Good  Psychomotor Activity:  Normal  Concentration:    Recall:  [place] of Knowledge:Good  Language: Good  Akathisia:  No  Handed:  Right  AIMS (if indicated):    Assets:  Desire for Improvement  ADL's:  Intact  Cognition: WNL  Sleep:      Treatment Plan  Summary:  At this time she is a history of major depression.  She seems to be cognitively different.  There have been recent recommendations that actually the Lexapro in an elderly person should not be greater than 10 mg.  At this time I am choosing to reduce her dose from 20-10 for 4 days and then to discontinue Lexapro completely.  I have also asked her to discontinue the doxepin 10 mg which does not seem to have a consistent benefit.  I would like to reduce psychotropic medicines as best I can and reevaluate her in 1 to 2 months after she sees her neurologist.  Her fatigue and her dysfunction might be related to her result of having a coronavirus that she recovered from.  I would suspect this would should be tested.  The only medicine she will take for me at this time will be Vistaril 25 mg at night for sleep. Jerral Ralph, MD 9/24/20202:07 PM

## 2019-06-04 ENCOUNTER — Telehealth (HOSPITAL_COMMUNITY): Payer: Self-pay

## 2019-06-04 NOTE — Telephone Encounter (Signed)
Patient called to make you aware that she's not able to see her primary care doctor before her appointment with you on 06/19/19. She let them know to put her in on a cancellation if they get one. Also, she stated that she's seeing her neurologist tomorrow 06/05/19. Thank you.

## 2019-06-05 ENCOUNTER — Other Ambulatory Visit: Payer: Self-pay

## 2019-06-05 ENCOUNTER — Ambulatory Visit: Payer: Commercial Managed Care - PPO | Admitting: Neurology

## 2019-06-05 ENCOUNTER — Encounter: Payer: Self-pay | Admitting: Neurology

## 2019-06-05 VITALS — BP 138/63 | HR 75 | Ht 59.0 in | Wt 88.0 lb

## 2019-06-05 DIAGNOSIS — G43009 Migraine without aura, not intractable, without status migrainosus: Secondary | ICD-10-CM

## 2019-06-05 DIAGNOSIS — G40001 Localization-related (focal) (partial) idiopathic epilepsy and epileptic syndromes with seizures of localized onset, not intractable, with status epilepticus: Secondary | ICD-10-CM

## 2019-06-05 MED ORDER — PREGABALIN 150 MG PO CAPS: 150.0000 mg | ORAL_CAPSULE | Freq: Two times a day (BID) | ORAL | 5 refills | Status: DC

## 2019-06-05 MED ORDER — PHENOBARBITAL 64.8 MG PO TABS: 64.8000 mg | ORAL_TABLET | Freq: Two times a day (BID) | ORAL | 5 refills | Status: DC

## 2019-06-05 NOTE — Progress Notes (Signed)
NEUROLOGY FOLLOW UP OFFICE NOTE  Samantha Atkins [id number] 06/16/1951  HISTORY OF PRESENT ILLNESS: I had the pleasure of seeing Farrie Usrey in follow-up in the neurology clinic on 06/05/2019.  The patient was last seen 4 months ago for seizures. She is alone in the office today. She continues to do well seizure-free since June 2019, at that time she was admitted for status epilepticus with electrographic seizures from the right anterior frontal and paracentral region. There were also occasional left posterior temporal/parietal epileptiform discharges interictally. She is on Levetiracetam 500mg  BID, Phenobarbital 64.8mg  BID, and Lyrica 150mg  BID, no side effects. She denies any staring/unresponsive episodes, gaps in time, olfactory/gustatory hallucinations, focal numbness/tingling/weakness, myoclonic jerks. She has a history of migraines and reports the headaches are not so bad, she takes prn Relpax with good response. She has noticed that she occasionally would not remember a word, she knows it in her head and knows the first letter. She has noticed these words are always a noun.   History on Initial Assessment 03/02/2018: This is a pleasant 68 year old right-handed woman with a history of osteoporosis, depression, GERD, presenting for hospital follow-up after hospital admission for status epilepticus. Records were reviewed. She was admitted for a fall in July 2018 and was found to have a left hip fracture. She had no recollection of the fall and was getting ready for surgery when she had an episode of staring and unresponsiveness with some hand wringing movements. On further questioning, she had been having these episodes for several years which her husband had attributed to her underlying psychiatric condition. She has severe depression and anxiety. Her husband reported multiple falls, they were in the bedroom one time and he noted an awkward pause in conversation then she "fell like a tree." No convulsive  activity noted. She had an EEG which showed mild to moderate diffuse slowing, no epileptiform discharges seen. CT head showed diffuse cerebral and cerebellar atrophy, chronic microvascular disease. She was discharged on Keppra 500mg  BID. She was brought back to the hospital last 01/29/18 for status epilepticus. Her husband found her partway off the bed unresponsive, foaming at the mouth. She was given IV Versed, then had another seizure and was given IV Ativan. ER noted eyelid twitching and right gaze deviation, as well as right arm and leg twitching. She underwent 5 days of inpatient video EEG monitoring, initial EEG showed focal slowing on the right posterior region with runs of brief rhythmic activity in the left frontal region. She had worsening changes on EEG, with electrographic seizures arising from the right anterior frontal and paracentral region. Several seizure medications were started, with complete resolution of right PLEDs on addition of phenobarbital. There was also occasional left posterior tempora/parietal epileptiform discharges seen. I personally reviewed MRI brain without contrast which did not show any acute changes, there was moderate brain atrophy, mild chronic microvascular disease. She had a lumbar puncture with slightly elevated protein of 78, negative HSV. Over the course of her hospital stay, Keppra and Dilantin were discontinued, she was discharged home on Phenobarbital 60mg  BID and Lyrica 150mg  every 8 hours. Her husband denies any further seizures since hospital discharge 2 weeks ago. Her husband fills her pillbox but she takes medications herself, occasionally missing the noon dose of Lyrica. She denies any olfactory/gustatory hallucinations, deja vu, rising epigastric sensation, focal numbness/tingling/weakness, myoclonic jerks. She has tingling in both feet and reports a history of leg length discrepancy. She has a history of migraines with  severe migraines 2-3 times a week, lasting  3-5 days. They used to be over the frontal region but lately have been in the back of her head, with nausea, vomiting, photo/phonophobia. She denies any migraine aura or visual obscurations. No family history of migraines. She has a lot of neck pain and shoulder pain. No dizziness, diplopia, dysarthria/dysphagia, bowel/bladder dysfunction. She initially had some urinary incontinence which is better. She has been using her walker since a fall in 2013 where she broke her right leg. She has been dealing with a lot of stress and depression, worsening her headaches. She takes Fioricet every 6 hours as needed and states she does not take it daily. She reports severe depression and insomnia, she reports Sertraline was stopped in the hospital and asks about resuming medication. Melatonin did not help with sleep.   Epilepsy Risk Factors:  She had a normal birth and early development.  There is no history of febrile convulsions, CNS infections such as meningitis/encephalitis, significant traumatic brain injury, neurosurgical procedures, or family history of seizures.   PAST MEDICAL HISTORY: Past Medical History:  Diagnosis Date  . Anxiety   . Arthritis    hands  . Cancer (HCC)    HISTORY OF OVARIAN CANCER  . Depression   . Fracture of humerus, proximal, left, closed 08/07/2015  . Fracture, humerus closed 07-31-15 fell   left  . GERD (gastroesophageal reflux disease)   . Headache   . Insomnia   . Migraines   . Osteoporosis   . S/P right hip fracture 1990   fractures X 2  . Seizures ([place])     MEDICATIONS: Current Outpatient Medications on File Prior to Visit  Medication Sig Dispense Refill  . calcium citrate-vitamin D (CITRACAL+D) 315-200 MG-UNIT tablet Take 1 tablet by mouth 2 (two) times daily.    . cholecalciferol (VITAMIN D) 1000 units tablet Take 1,000 Units by mouth daily.    Marland Kitchen doxepin (SINEQUAN) 10 MG capsule Take 2 capsules (20 mg total) by mouth at bedtime. 60 capsule 6  . eletriptan  (RELPAX) 20 MG tablet Take 1 tablet at onset of migraine. Do not take more than 2-3 a week 10 tablet 11  . feeding supplement (BOOST HIGH PROTEIN) LIQD Take 1 Container by mouth daily.    . hydrOXYzine (VISTARIL) 25 MG capsule 1  qhs 30 capsule 5  . levETIRAcetam (KEPPRA) 500 MG tablet Take 1 tablet (500 mg total) by mouth 2 (two) times daily. 60 tablet 11  . LORazepam (ATIVAN) 1 MG tablet Take 1 tablet (1 mg total) by mouth every 4 (four) hours as needed for anxiety. 30 tablet 0  . Multiple Vitamin (MULTIVITAMIN WITH MINERALS) TABS tablet Take 1 tablet by mouth daily.    Marland Kitchen omeprazole (PRILOSEC) 20 MG capsule Take 20 mg by mouth daily.    Marland Kitchen PHENobarbital (LUMINAL) 64.8 MG tablet Take 1 tablet (64.8 mg total) by mouth 2 (two) times daily. 60 tablet 5  . potassium chloride SA (K-DUR,KLOR-CON) 20 MEQ tablet Take 20 mEq by mouth daily.    . pregabalin (LYRICA) 150 MG capsule Take 1 capsule (150 mg total) by mouth 2 (two) times daily. 60 capsule 5  . psyllium (HYDROCIL/METAMUCIL) 95 % PACK Take 1 packet by mouth daily.    Marland Kitchen senna-docusate (SENOKOT-S) 8.6-50 MG tablet Take 1 tablet by mouth at bedtime. 30 tablet 0  . ferrous sulfate 325 (65 FE) MG EC tablet Take 1 tablet (325 mg total) by mouth every other day. (Patient taking  differently: Take 325 mg by mouth daily with breakfast. ) 60 tablet 0   No current facility-administered medications on file prior to visit.     ALLERGIES: Allergies  Allergen Reactions  . Dust Mite Extract Cough    FAMILY HISTORY: Family History  Problem Relation Age of Onset  . Hip fracture Mother   . Heart Problems Father   . Alcohol abuse Father   . Drug abuse Father   . Anxiety disorder Father   . Parkinson's disease Brother   . Healthy Daughter     SOCIAL HISTORY: Social History   Socioeconomic History  . Marital status: Married    Spouse name: Not on file  . Number of children: Not on file  . Years of education: Not on file  . Highest education  level: Not on file  Occupational History  . Not on file  Social Needs  . Financial resource strain: Not on file  . Food insecurity    Worry: Not on file    Inability: Not on file  . Transportation needs    Medical: Not on file    Non-medical: Not on file  Tobacco Use  . Smoking status: Never Smoker  . Smokeless tobacco: Never Used  Substance and Sexual Activity  . Alcohol use: No  . Drug use: No  . Sexual activity: Not Currently  Lifestyle  . Physical activity    Days per week: Not on file    Minutes per session: Not on file  . Stress: Not on file  Relationships  . Social Herbalist on phone: Not on file    Gets together: Not on file    Attends religious service: Not on file    Active member of club or organization: Not on file    Attends meetings of clubs or organizations: Not on file    Relationship status: Not on file  . Intimate partner violence    Fear of current or ex partner: Not on file    Emotionally abused: Not on file    Physically abused: Not on file    Forced sexual activity: Not on file  Other Topics Concern  . Not on file  Social History Narrative   Right handed      Associates Degree      Lives with husband in one story home        REVIEW OF SYSTEMS: Constitutional: No fevers, chills, or sweats, no generalized fatigue, change in appetite Eyes: No visual changes, double vision, eye pain Ear, nose and throat: No hearing loss, ear pain, nasal congestion, sore throat Cardiovascular: No chest pain, palpitations Respiratory:  No shortness of breath at rest or with exertion, wheezes GastrointestinaI: No nausea, vomiting, diarrhea, abdominal pain, fecal incontinence Genitourinary:  No dysuria, urinary retention or frequency Musculoskeletal:  No neck pain, back pain Integumentary: No rash, pruritus, skin lesions Neurological: as above Psychiatric: No depression, insomnia, anxiety Endocrine: No palpitations, fatigue, diaphoresis, mood swings,  change in appetite, change in weight, increased thirst Hematologic/Lymphatic:  No anemia, purpura, petechiae. Allergic/Immunologic: no itchy/runny eyes, nasal congestion, recent allergic reactions, rashes  PHYSICAL EXAM: Vitals:   06/05/19 1532  BP: 138/63  Pulse: 75  SpO2: 96%   General: No acute distress Head:  Normocephalic/atraumatic Skin/Extremities: No rash, no edema Neurological Exam: alert and oriented to person, place, and time. No aphasia or dysarthria. Fund of knowledge is appropriate.  Recent and remote memory are intact.  Attention and concentration are normal.  Able to name objects and repeat phrases. Cranial nerves: Pupils equal, round, reactive to light.  Extraocular movements intact with no nystagmus. Visual fields full. No facial asymmetry. Motor: Bulk and tone normal, muscle strength 5/5 throughout with no pronator drift. Finger to nose testing intact.  Gait narrow-based and steady, mild difficulty with tandem walk due to left leg length discrepancy.    Assessment and Plan:   This is a pleasant 68 yo RH woman with a history of depression, anxiety, focal to bilateral tonic-clonic epilepsy, etiology unclear. MRI brain no acute changes. She was in status epilepticus in June 2019 with electrographic seizures arising from the right anterior frontal and paracentral region, then right-sided PLEDs. There were also occasional left posterior temporal/parietal epileptiform discharges seen. She has been seizure-free since June 2019 on 3 AEDs, and is interested in weaning off one. We discussed that phenobarbital has the most potential long-term side effects, however it was phenobarbital that significantly quieted down seizures. We agreed to slowly wean off Levetiracetam first, continue Phenobarbital 64.8mg  BID, and Lyrica 150mg  BID (which she has been taking for migraine prophylaxis as well). She has Relpax for migraine rescue. She has not been driving and is aware of [place] driving laws to  stop driving after a seizure until 6 months seizure-free. She will follow-up in 4 months and knows to call for any changes.   Thank you for allowing me to participate in her care.  Please do not hesitate to call for any questions or concerns.   Ellouise Newer, M.D.   CC: Dr. Sabra Heck

## 2019-06-05 NOTE — Patient Instructions (Signed)
Great seeing you! 1. Start weaning off the Keppra (Levetiracetam) 500mg : take 1/2 tab in AM, 1 tab in PM for a week, then reduce to 1/2 tablet twice a day for a week, then reduce to 1/2 tablet every night for a week, then stop  2. Continue Phenobarbital and Lyrica  3. Follow-up in 4 months, call for any changes  Seizure Precautions: 1. If medication has been prescribed for you to prevent seizures, take it exactly as directed.  Do not stop taking the medicine without talking to your doctor first, even if you have not had a seizure in a long time.   2. Avoid activities in which a seizure would cause danger to yourself or to others.  Don't operate dangerous machinery, swim alone, or climb in high or dangerous places, such as on ladders, roofs, or girders.  Do not drive unless your doctor says you may.  3. If you have any warning that you may have a seizure, lay down in a safe place where you can't hurt yourself.    4.  No driving for 6 months from last seizure, as per [facility].   Please refer to the following link on the [facility] website for more information: http://www.epilepsyfoundation.org/answerplace/Social/driving/drivingu.cfm   5.  Maintain good sleep hygiene. Avoid alcohol.  6.  Contact your doctor if you have any problems that may be related to the medicine you are taking.  7.  Call 911 and bring the patient back to the ED if:        A.  The seizure lasts longer than 5 minutes.       B.  The patient doesn't awaken shortly after the seizure  C.  The patient has new problems such as difficulty seeing, speaking or moving  D.  The patient was injured during the seizure  E.  The patient has a temperature over 102 F (39C)  F.  The patient vomited and now is having trouble breathing

## 2019-06-12 NOTE — Telephone Encounter (Signed)
Message relayed.

## 2019-06-19 ENCOUNTER — Ambulatory Visit (INDEPENDENT_AMBULATORY_CARE_PROVIDER_SITE_OTHER): Payer: Commercial Managed Care - PPO | Admitting: Psychiatry

## 2019-06-19 ENCOUNTER — Other Ambulatory Visit: Payer: Self-pay

## 2019-06-19 DIAGNOSIS — F341 Dysthymic disorder: Secondary | ICD-10-CM

## 2019-06-19 NOTE — Progress Notes (Signed)
Pain management  Psychiatric Initial Adult Assessment   Patient Identification: Samantha Atkins MRN:  [id number] Date of Evaluation:  06/19/2019 Referral Source: Dr  Michele Mcalpine Chief Complaint:  Chronic depression Visit Diagnosis adjustment disorder with depression  Today the patient is seen first alone and then with her husband [name].  This is actually a phone call conversation.  Overall patient seems to be somewhat better than her last visit.  According to [name] she is less depressed.  Unfortunately she was under the care of Dr. Park Breed in psychotherapy but they seem to have a falling out.  At this time the patient is sleeping better taking Vistaril.  Noted is that she is off Lexapro and she is off of doxepin.  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  She has few will vegetative symptoms.  Her appetite is good her energy level is good and she can still think and concentrate.  She is not suicidal now she is never been suicidal.  She has a lot of physical complaints may related to multiple breaks over the last year or 2.  She has broken her arm twice her leg and had a hip replacement.  She has significant osteoporosis.  Recently she saw a neurologist.  She has complaints of chronic fatigue, body pain and malaise.  Today though she is better than she was on her last visit.  Her language is better.  She is able to express herself better.  She acknowledges that she does need to be in psychotherapy and to go ahead and call the 2 other therapist names that I gave her in the past. Associated Signs/Symptoms: Depression Symptoms:  depressed mood, (Hypo) Manic Symptoms:   Anxiety Symptoms:   Psychotic Symptoms:   PTSD Symptoms:   Past Psychiatric History: Prozac, Zoloft now on Lexapro 1 psychiatric hospitalization 2 decades ago  Previous Psychotropic Medications: Lexapro 10 mg, Ativan 1 mg as needed  Substance Abuse History in the last 12 months:  No.  Consequences of Substance Abuse:   Past  Medical History:  Past Medical History:  Diagnosis Date  . Anxiety   . Arthritis    hands  . Cancer (HCC)    HISTORY OF OVARIAN CANCER  . Depression   . Fracture of humerus, proximal, left, closed 08/07/2015  . Fracture, humerus closed 07-31-15 fell   left  . GERD (gastroesophageal reflux disease)   . Headache   . Insomnia   . Migraines   . Osteoporosis   . S/P right hip fracture 1990   fractures X 2  . Seizures ([place])     Past Surgical History:  Procedure Laterality Date  . ABDOMINAL HYSTERECTOMY    . FEMUR FRACTURE SURGERY Left    fell, redo hip replacement  . JOINT REPLACEMENT Bilateral    hip  . ORIF DISTAL RADIUS FRACTURE Left 03-2015  . ORIF HUMERUS FRACTURE Left 08/07/2015   Procedure: LEFT OPEN REDUCTION INTERNAL FIXATION (ORIF) PROXIMAL HUMERUS FRACTURE;  Surgeon: Marchia Bond, MD;  Location: [facility];  Service: Orthopedics;  Laterality: Left;  . TONSILLECTOMY    . TOTAL HIP REVISION Left 02/14/2017   Procedure: LEFT ORIF PERIPROSTHETIC FEMORAL FRACTURE AND REVISION HIP ARTHROPLASTY;  Surgeon: Paralee Cancel, MD;  Location: [facility];  Service: Orthopedics;  Laterality: Left;    Family Psychiatric History:   Family History:  Family History  Problem Relation Age of Onset  . Hip fracture Mother   . Heart Problems Father   . Alcohol abuse  Father   . Drug abuse Father   . Anxiety disorder Father   . Parkinson's disease Brother   . Healthy Daughter     Social History:   Social History   Socioeconomic History  . Marital status: Married    Spouse name: Not on file  . Number of children: Not on file  . Years of education: Not on file  . Highest education level: Not on file  Occupational History  . Not on file  Social Needs  . Financial resource strain: Not on file  . Food insecurity    Worry: Not on file    Inability: Not on file  . Transportation needs    Medical: Not on file    Non-medical: Not on file  Tobacco Use  . Smoking status:  Never Smoker  . Smokeless tobacco: Never Used  Substance and Sexual Activity  . Alcohol use: No  . Drug use: No  . Sexual activity: Not Currently  Lifestyle  . Physical activity    Days per week: Not on file    Minutes per session: Not on file  . Stress: Not on file  Relationships  . Social Herbalist on phone: Not on file    Gets together: Not on file    Attends religious service: Not on file    Active member of club or organization: Not on file    Attends meetings of clubs or organizations: Not on file    Relationship status: Not on file  Other Topics Concern  . Not on file  Social History Narrative   Right handed      Associates Degree      Lives with husband in one story home        Additional Social History:   Allergies:   Allergies  Allergen Reactions  . Dust Mite Extract Cough    Metabolic Disorder Labs: No results found for: HGBA1C, MPG No results found for: PROLACTIN No results found for: CHOL, TRIG, HDL, CHOLHDL, VLDL, LDLCALC   Current Medications: Current Outpatient Medications  Medication Sig Dispense Refill  . calcium citrate-vitamin D (CITRACAL+D) 315-200 MG-UNIT tablet Take 1 tablet by mouth 2 (two) times daily.    . cholecalciferol (VITAMIN D) 1000 units tablet Take 1,000 Units by mouth daily.    Marland Kitchen eletriptan (RELPAX) 20 MG tablet Take 1 tablet at onset of migraine. Do not take more than 2-3 a week 10 tablet 11  . feeding supplement (BOOST HIGH PROTEIN) LIQD Take 1 Container by mouth daily.    . ferrous sulfate 325 (65 FE) MG EC tablet Take 1 tablet (325 mg total) by mouth every other day. (Patient taking differently: Take 325 mg by mouth daily with breakfast. ) 60 tablet 0  . hydrOXYzine (VISTARIL) 25 MG capsule 1  qhs 30 capsule 5  . LORazepam (ATIVAN) 1 MG tablet Take 1 tablet (1 mg total) by mouth every 4 (four) hours as needed for anxiety. 30 tablet 0  . Multiple Vitamin (MULTIVITAMIN WITH MINERALS) TABS tablet Take 1 tablet by  mouth daily.    Marland Kitchen omeprazole (PRILOSEC) 20 MG capsule Take 20 mg by mouth daily.    Marland Kitchen PHENobarbital (LUMINAL) 64.8 MG tablet Take 1 tablet (64.8 mg total) by mouth 2 (two) times daily. 60 tablet 5  . potassium chloride SA (K-DUR,KLOR-CON) 20 MEQ tablet Take 20 mEq by mouth daily.    . pregabalin (LYRICA) 150 MG capsule Take 1 capsule (150 mg total) by mouth  2 (two) times daily. 60 capsule 5  . psyllium (HYDROCIL/METAMUCIL) 95 % PACK Take 1 packet by mouth daily.    Marland Kitchen senna-docusate (SENOKOT-S) 8.6-50 MG tablet Take 1 tablet by mouth at bedtime. 30 tablet 0   No current facility-administered medications for this visit.     Neurologic: Headache: No Seizure: Yes Paresthesias:No  Musculoskeletal: Strength & Muscle Tone: decreased Gait & Station: unable to stand Patient leans: N/A  Psychiatric Specialty Exam: ROS  There were no vitals taken for this visit.There is no height or weight on file to calculate BMI.  General Appearance: Fairly Groomed  Eye Contact:    Speech:  Clear and Coherent  Volume:  Normal  Mood:  Anxious  Affect:  Congruent  Thought Process:  Goal Directed  Orientation:  Full (Time, Place, and Person)  Thought Content:  Logical  Suicidal Thoughts:  No  Homicidal Thoughts:  No  Memory:  NA  Judgement:  Good  Insight:  Good  Psychomotor Activity:  Normal  Concentration:    Recall:  [place] of Knowledge:Good  Language: Good  Akathisia:  No  Handed:  Right  AIMS (if indicated):    Assets:  Desire for Improvement  ADL's:  Intact  Cognition: WNL  Sleep:      Treatment Plan Summary:  At this time I think this patient either has an adjustment disorder with a depressed mood or dysthymic disorder.  Given that she likely could have a letter I would like to treat this patient with desipramine.  I will begin her on 25 mg in the morning but first I will confirm that she has a normal EKG.  I will not be giving this patient Wellbutrin since she has had a seizure  in the last year.  Desipramine might help her energy.  Her second problem is insomnia.  At this time she is doing well on Vistaril.  This patient will be seen again in 2-1/2 months. Jerral Ralph, MD 11/11/20202:47 PM

## 2019-06-20 ENCOUNTER — Ambulatory Visit (HOSPITAL_COMMUNITY): Payer: Commercial Managed Care - PPO | Admitting: Psychiatry

## 2019-06-21 ENCOUNTER — Other Ambulatory Visit (HOSPITAL_COMMUNITY): Payer: Self-pay

## 2019-06-21 MED ORDER — LEVOMILNACIPRAN HCL ER 20 MG PO CP24: 20.0000 mg | ORAL_CAPSULE | Freq: Every day | ORAL | 0 refills | Status: DC

## 2019-06-21 NOTE — Progress Notes (Unsigned)
Dr. Casimiro Needle called and asked me to send in a prescription of [place] for this patient, rx sent to pharmacy

## 2019-07-15 ENCOUNTER — Other Ambulatory Visit (HOSPITAL_COMMUNITY): Payer: Self-pay | Admitting: Psychiatry

## 2019-07-19 ENCOUNTER — Other Ambulatory Visit (HOSPITAL_COMMUNITY): Payer: Self-pay | Admitting: *Deleted

## 2019-07-19 MED ORDER — LEVOMILNACIPRAN HCL ER 20 MG PO CP24: 20.0000 mg | ORAL_CAPSULE | Freq: Every day | ORAL | 2 refills | Status: DC

## 2019-07-31 ENCOUNTER — Ambulatory Visit (HOSPITAL_COMMUNITY): Payer: Commercial Managed Care - PPO | Admitting: Psychiatry

## 2019-08-13 ENCOUNTER — Ambulatory Visit (INDEPENDENT_AMBULATORY_CARE_PROVIDER_SITE_OTHER): Payer: 59 | Admitting: Psychiatry

## 2019-08-13 ENCOUNTER — Other Ambulatory Visit: Payer: Self-pay

## 2019-08-13 DIAGNOSIS — G4700 Insomnia, unspecified: Secondary | ICD-10-CM | POA: Diagnosis not present

## 2019-08-13 DIAGNOSIS — F341 Dysthymic disorder: Secondary | ICD-10-CM | POA: Diagnosis not present

## 2019-08-13 MED ORDER — HYDROXYZINE PAMOATE 25 MG PO CAPS: ORAL_CAPSULE | ORAL | 5 refills | Status: DC

## 2019-08-13 MED ORDER — LEVOMILNACIPRAN HCL ER 20 MG PO CP24: 20.0000 mg | ORAL_CAPSULE | Freq: Every day | ORAL | 2 refills | Status: DC

## 2019-08-13 NOTE — Progress Notes (Signed)
Pain management  Psychiatric Initial Adult Assessment   Patient Identification: Samantha Atkins MRN:  [id number] Date of Evaluation:  08/13/2019 Referral Source: Dr  Michele Mcalpine Chief Complaint:  Chronic depression Visit Diagnosis adjustment disorder with depression  Today the patient is doing very well.  Her mood seems to be improved.  We clarified her diagnosis of dysthymic disorder.  She actually is sleeping fairly well.  She does take naps but she does not seem to be bothered by it.  She is eating well.  It seems that her energy level is improved.  It should be noted that at this time she is on Fetzima 20 mg.  She seems to accept that it seems to be somewhat helpful.  She is agreed to continue taking it.  She denies the use of drugs or alcohol.  She is not suicidal.  She is functioning fairly well.  Her health seems to be fairly good. Associated Signs/Symptoms: Depression Symptoms:  depressed mood, (Hypo) Manic Symptoms:   Anxiety Symptoms:   Psychotic Symptoms:   PTSD Symptoms:   Past Psychiatric History: Prozac, Zoloft now on Lexapro 1 psychiatric hospitalization 2 decades ago  Previous Psychotropic Medications: Lexapro 10 mg, Ativan 1 mg as needed  Substance Abuse History in the last 12 months:  No.  Consequences of Substance Abuse:   Past Medical History:  Past Medical History:  Diagnosis Date  . Anxiety   . Arthritis    hands  . Cancer (HCC)    HISTORY OF OVARIAN CANCER  . Depression   . Fracture of humerus, proximal, left, closed 08/07/2015  . Fracture, humerus closed 07-31-15 fell   left  . GERD (gastroesophageal reflux disease)   . Headache   . Insomnia   . Migraines   . Osteoporosis   . S/P right hip fracture 1990   fractures X 2  . Seizures ([place])     Past Surgical History:  Procedure Laterality Date  . ABDOMINAL HYSTERECTOMY    . FEMUR FRACTURE SURGERY Left    fell, redo hip replacement  . JOINT REPLACEMENT Bilateral    hip  . ORIF DISTAL RADIUS FRACTURE Left  03-2015  . ORIF HUMERUS FRACTURE Left 08/07/2015   Procedure: LEFT OPEN REDUCTION INTERNAL FIXATION (ORIF) PROXIMAL HUMERUS FRACTURE;  Surgeon: Marchia Bond, MD;  Location: [place];  Service: Orthopedics;  Laterality: Left;  . TONSILLECTOMY    . TOTAL HIP REVISION Left 02/14/2017   Procedure: LEFT ORIF PERIPROSTHETIC FEMORAL FRACTURE AND REVISION HIP ARTHROPLASTY;  Surgeon: Paralee Cancel, MD;  Location: [facility];  Service: Orthopedics;  Laterality: Left;    Family Psychiatric History:   Family History:  Family History  Problem Relation Age of Onset  . Hip fracture Mother   . Heart Problems Father   . Alcohol abuse Father   . Drug abuse Father   . Anxiety disorder Father   . Parkinson's disease Brother   . Healthy Daughter     Social History:   Social History   Socioeconomic History  . Marital status: Married    Spouse name: Not on file  . Number of children: Not on file  . Years of education: Not on file  . Highest education level: Not on file  Occupational History  . Not on file  Tobacco Use  . Smoking status: Never Smoker  . Smokeless tobacco: Never Used  Substance and Sexual Activity  . Alcohol use: No  . Drug use: No  . Sexual activity: Not Currently  Other Topics Concern  . Not on file  Social History Narrative   Right handed      Associates Degree      Lives with husband in one story home       Social Determinants of Health   Financial Resource Strain:   . Difficulty of Paying Living Expenses: Not on file  Food Insecurity:   . Worried About Charity fundraiser in the Last Year: Not on file  . Ran Out of Food in the Last Year: Not on file  Transportation Needs:   . Lack of Transportation (Medical): Not on file  . Lack of Transportation (Non-Medical): Not on file  Physical Activity:   . Days of Exercise per Week: Not on file  . Minutes of Exercise per Session: Not on file  Stress:   . Feeling of Stress : Not on file  Social  Connections:   . Frequency of Communication with Friends and Family: Not on file  . Frequency of Social Gatherings with Friends and Family: Not on file  . Attends Religious Services: Not on file  . Active Member of Clubs or Organizations: Not on file  . Attends Archivist Meetings: Not on file  . Marital Status: Not on file    Additional Social History:   Allergies:   Allergies  Allergen Reactions  . Dust Mite Extract Cough    Metabolic Disorder Labs: No results found for: HGBA1C, MPG No results found for: PROLACTIN No results found for: CHOL, TRIG, HDL, CHOLHDL, VLDL, LDLCALC   Current Medications: Current Outpatient Medications  Medication Sig Dispense Refill  . calcium citrate-vitamin D (CITRACAL+D) 315-200 MG-UNIT tablet Take 1 tablet by mouth 2 (two) times daily.    . cholecalciferol (VITAMIN D) 1000 units tablet Take 1,000 Units by mouth daily.    Marland Kitchen eletriptan (RELPAX) 20 MG tablet Take 1 tablet at onset of migraine. Do not take more than 2-3 a week 10 tablet 11  . feeding supplement (BOOST HIGH PROTEIN) LIQD Take 1 Container by mouth daily.    . ferrous sulfate 325 (65 FE) MG EC tablet Take 1 tablet (325 mg total) by mouth every other day. (Patient taking differently: Take 325 mg by mouth daily with breakfast. ) 60 tablet 0  . hydrOXYzine (VISTARIL) 25 MG capsule 1  qhs 30 capsule 5  . Levomilnacipran HCl ER 20 MG CP24 Take 20 mg by mouth daily. 30 capsule 2  . LORazepam (ATIVAN) 1 MG tablet Take 1 tablet (1 mg total) by mouth every 4 (four) hours as needed for anxiety. 30 tablet 0  . Multiple Vitamin (MULTIVITAMIN WITH MINERALS) TABS tablet Take 1 tablet by mouth daily.    Marland Kitchen omeprazole (PRILOSEC) 20 MG capsule Take 20 mg by mouth daily.    Marland Kitchen PHENobarbital (LUMINAL) 64.8 MG tablet Take 1 tablet (64.8 mg total) by mouth 2 (two) times daily. 60 tablet 5  . potassium chloride SA (K-DUR,KLOR-CON) 20 MEQ tablet Take 20 mEq by mouth daily.    . pregabalin (LYRICA) 150  MG capsule Take 1 capsule (150 mg total) by mouth 2 (two) times daily. 60 capsule 5  . psyllium (HYDROCIL/METAMUCIL) 95 % PACK Take 1 packet by mouth daily.    Marland Kitchen senna-docusate (SENOKOT-S) 8.6-50 MG tablet Take 1 tablet by mouth at bedtime. 30 tablet 0   No current facility-administered medications for this visit.    Neurologic: Headache: No Seizure: Yes Paresthesias:No  Musculoskeletal: Strength & Muscle Tone: decreased Gait & Station:  unable to stand Patient leans: N/A  Psychiatric Specialty Exam: ROS  There were no vitals taken for this visit.There is no height or weight on file to calculate BMI.  General Appearance: Fairly Groomed  Eye Contact:    Speech:  Clear and Coherent  Volume:  Normal  Mood:  Anxious  Affect:  Congruent  Thought Process:  Goal Directed  Orientation:  Full (Time, Place, and Person)  Thought Content:  Logical  Suicidal Thoughts:  No  Homicidal Thoughts:  No  Memory:  NA  Judgement:  Good  Insight:  Good  Psychomotor Activity:  Normal  Concentration:    Recall:  [place] of Knowledge:Good  Language: Good  Akathisia:  No  Handed:  Right  AIMS (if indicated):    Assets:  Desire for Improvement  ADL's:  Intact  Cognition: WNL  Sleep:      Treatment Plan Summary:  At this time the patient has 2 problems.  One is that she has dysthymic disorder and she is taking Fetzima and seems to gotten somewhat of a response.  Her second problem is that of insomnia.  She takes Vistaril for this and sleeps very well.  For the most part she is functioning well and doing well.  She has no complaints today.  Today was a short visit and will reevaluate her in approximately 4 to 5 months from now. Jerral Ralph, MD 1/5/20214:48 PM

## 2019-09-12 ENCOUNTER — Telehealth: Payer: Self-pay | Admitting: Neurology

## 2019-09-12 ENCOUNTER — Encounter (HOSPITAL_COMMUNITY): Payer: Self-pay | Admitting: *Deleted

## 2019-09-12 MED ORDER — PHENOBARBITAL 64.8 MG PO TABS: 64.8000 mg | ORAL_TABLET | Freq: Two times a day (BID) | ORAL | 3 refills | Status: DC

## 2019-09-12 MED ORDER — PREGABALIN 150 MG PO CAPS: 150.0000 mg | ORAL_CAPSULE | Freq: Two times a day (BID) | ORAL | 3 refills | Status: DC

## 2019-09-12 NOTE — Telephone Encounter (Signed)
90-day Rx sent for both meds

## 2019-09-12 NOTE — Telephone Encounter (Signed)
Patient has a new insurance and they would like [place] to call in the phenobartial 64.8 mg and the lyrica 150 mg to [facility] on [address] but they want  a 90 day supply of both it is cheaper that way

## 2019-09-12 NOTE — Telephone Encounter (Signed)
Please advise on refills.  

## 2019-09-30 ENCOUNTER — Encounter: Payer: Self-pay | Admitting: Neurology

## 2019-09-30 ENCOUNTER — Other Ambulatory Visit: Payer: Self-pay

## 2019-09-30 ENCOUNTER — Telehealth (INDEPENDENT_AMBULATORY_CARE_PROVIDER_SITE_OTHER): Payer: Self-pay | Admitting: Neurology

## 2019-09-30 VITALS — Ht 60.0 in | Wt 90.0 lb

## 2019-09-30 DIAGNOSIS — G43009 Migraine without aura, not intractable, without status migrainosus: Secondary | ICD-10-CM

## 2019-09-30 DIAGNOSIS — G40001 Localization-related (focal) (partial) idiopathic epilepsy and epileptic syndromes with seizures of localized onset, not intractable, with status epilepticus: Secondary | ICD-10-CM

## 2019-09-30 MED ORDER — LEVETIRACETAM 500 MG PO TABS: ORAL_TABLET | ORAL | 3 refills | Status: DC

## 2019-09-30 NOTE — Progress Notes (Signed)
Telephone (Audio) Visit The purpose of this telephone visit is to provide medical care while limiting exposure to the novel coronavirus.    Consent was obtained for telephone visit:  Yes.   Answered questions that patient had about telehealth interaction:  Yes.   I discussed the limitations, risks, security and privacy concerns of performing an evaluation and management service by telephone. I also discussed with the patient that there may be a patient responsible charge related to this service. The patient expressed understanding and agreed to proceed.  Pt location: Home Physician Location: office Name of referring provider:  Kathyrn Lass, MD I connected with .Lawerance Cruel at patients initiation/request on 09/30/2019 at  2:00 PM EST by telephone and verified that I am speaking with the correct person using two identifiers.  Pt MRN:  [id number] Pt DOB:  [birth year]   History of Present Illness:  The patient had a telephone visit on 09/30/2019. She was last seen in the neurology clinic 4 months ago for seizures. She is alone for today's visit. On her last visit, she reported doing well seizure-free since June 2019 when she was admitted for status epilepticus with electrographic seizures arising from the right anterior frontal and paracentral region. There were also occasional left posterior temporal/parietal epileptiform discharges interictally. Since she was doing well, we agreed to wean off Levetiracetam 500mg  BID. She continues on Phenobarbital 64.8mg  BID and Lyrica 150mg  BID. Since her last visit, she feels she may be having seizures at night, she has episodes of being very restless, flipping around in bed and waking up feeling cloudy. They occur once a week or every couple of weeks. She feels they started when LEV was weaned off. She wakes up drenched in sweat, no tongue bite or incontincence. Her husband hads not mentioned any staring/unresponsive episodes. She denies any gaps in time,  olfactory/gustatory hallucinations, focal numbness/tingling/weakness. She has not had much headaches, stating they have really dissipated. Her anxiety and depression are up. She denies any falls, she does not use any assistive devices except brings her walker when she goes out.    History on Initial Assessment 03/02/2018: This is a pleasant 69 year old right-handed woman with a history of osteoporosis, depression, GERD, presenting for hospital follow-up after hospital admission for status epilepticus. Records were reviewed. She was admitted for a fall in July 2018 and was found to have a left hip fracture. She had no recollection of the fall and was getting ready for surgery when she had an episode of staring and unresponsiveness with some hand wringing movements. On further questioning, she had been having these episodes for several years which her husband had attributed to her underlying psychiatric condition. She has severe depression and anxiety. Her husband reported multiple falls, they were in the bedroom one time and he noted an awkward pause in conversation then she "fell like a tree." No convulsive activity noted. She had an EEG which showed mild to moderate diffuse slowing, no epileptiform discharges seen. CT head showed diffuse cerebral and cerebellar atrophy, chronic microvascular disease. She was discharged on Keppra 500mg  BID. She was brought back to the hospital last 01/29/18 for status epilepticus. Her husband found her partway off the bed unresponsive, foaming at the mouth. She was given IV Versed, then had another seizure and was given IV Ativan. ER noted eyelid twitching and right gaze deviation, as well as right arm and leg twitching. She underwent 5 days of inpatient video EEG monitoring, initial EEG showed focal slowing  on the right posterior region with runs of brief rhythmic activity in the left frontal region. She had worsening changes on EEG, with electrographic seizures arising from the  right anterior frontal and paracentral region. Several seizure medications were started, with complete resolution of right PLEDs on addition of phenobarbital. There was also occasional left posterior tempora/parietal epileptiform discharges seen. I personally reviewed MRI brain without contrast which did not show any acute changes, there was moderate brain atrophy, mild chronic microvascular disease. She had a lumbar puncture with slightly elevated protein of 78, negative HSV. Over the course of her hospital stay, Keppra and Dilantin were discontinued, she was discharged home on Phenobarbital 60mg  BID and Lyrica 150mg  every 8 hours. Her husband denies any further seizures since 69 year old right-handed woman. Her husband fills her pillbox but she takes medications herself, occasionally missing the noon dose of Lyrica. She denies any olfactory/gustatory hallucinations, deja vu, rising epigastric sensation, focal numbness/tingling/weakness, myoclonic jerks. She has tingling in both feet and reports a history of leg length discrepancy. She has a history of migraines with severe migraines 2-3 times a week, lasting 3-5 days. They used to be over the frontal region but lately have been in the back of her head, with nausea, vomiting, photo/phonophobia. She denies any migraine aura or visual obscurations. No family history of migraines. She has a lot of neck pain and shoulder pain. No dizziness, diplopia, dysarthria/dysphagia, bowel/bladder dysfunction. She initially had some urinary incontinence which is better. She has been using her walker since a fall in 2013 where she broke her right leg. She has been dealing with a lot of stress and depression, worsening her headaches. She takes Fioricet every 6 hours as needed and states she does not take it daily. She reports severe depression and insomnia, she reports Sertraline was stopped in the hospital and asks about resuming medication. Melatonin did not help with sleep.    Epilepsy Risk Factors:  She had a normal birth and early development.  There is no history of febrile convulsions, CNS infections such as meningitis/encephalitis, significant traumatic brain injury, neurosurgical procedures, or family history of seizures.   MEDICATIONS: Current Outpatient Medications on File Prior to Visit  Medication Sig Dispense Refill  . calcium citrate-vitamin D (CITRACAL+D) 315-200 MG-UNIT tablet Take 1 tablet by mouth 2 (two) times daily.    . cholecalciferol (VITAMIN D) 1000 units tablet Take 1,000 Units by mouth daily.    Marland Kitchen eletriptan (RELPAX) 20 MG tablet Take 1 tablet at onset of migraine. Do not take more than 2-3 a week 10 tablet 11  . feeding supplement (BOOST HIGH PROTEIN) LIQD Take 1 Container by mouth daily.    . ferrous sulfate 325 (65 FE) MG EC tablet Take 1 tablet (325 mg total) by mouth every other day. (Patient taking differently: Take 325 mg by mouth daily with breakfast. ) 60 tablet 0  . hydrOXYzine (VISTARIL) 25 MG capsule 1  qhs 30 capsule 5  . Levomilnacipran HCl ER 20 MG CP24 Take 20 mg by mouth daily. 30 capsule 2  . LORazepam (ATIVAN) 1 MG tablet Take 1 tablet (1 mg total) by mouth every 4 (four) hours as needed for anxiety. 30 tablet 0  . Multiple Vitamin (MULTIVITAMIN WITH MINERALS) TABS tablet Take 1 tablet by mouth daily.    Marland Kitchen omeprazole (PRILOSEC) 20 MG capsule Take 20 mg by mouth daily.    Marland Kitchen PHENobarbital (LUMINAL) 64.8 MG tablet Take 1 tablet (64.8 mg total) by mouth 2 (two)  times daily. 180 tablet 3  . potassium chloride SA (K-DUR,KLOR-CON) 20 MEQ tablet Take 20 mEq by mouth daily.    . pregabalin (LYRICA) 150 MG capsule Take 1 capsule (150 mg total) by mouth 2 (two) times daily. 180 capsule 3  . psyllium (HYDROCIL/METAMUCIL) 95 % PACK Take 1 packet by mouth daily.    Marland Kitchen senna-docusate (SENOKOT-S) 8.6-50 MG tablet Take 1 tablet by mouth at bedtime. 30 tablet 0   No current facility-administered medications on file prior to visit.      Observations/Objective:   Vitals:   09/30/19 0937  Weight: 90 lb (40.8 kg)  Height: 5' (1.524 m)   Exam limited due to nature of telephone visit. Patient is awake, alert, able to answer questions without dysarthria or confusion.   Assessment and Plan:   This is a pleasant 69 yo RH woman with a history of depression, anxiety, focal to bilateral tonic-clonic epilepsy, etiology unclear. MRI brain no acute changes. She was in status epilepticus in June 2019 with electrographic seizures arising from the right anterior frontal and paracentral region, then right-sided PLEDs. There were also occasional left posterior temporal/parietal epileptiform discharges seen. She had been seizure-free since June 2019 on 3 AEDs, we discussed streamlining medications and weaned off Levetiracetam, however she has started to have episodes of restlessness at night, waking up feeling cloudy, unclear if seizures, however these started as we weaned off LEV. We discussed restarting low dose Levetiracetam 500mg  qhs, continue Phenobarbital 64.8mg  BID, and Lyrica 150mg  BID (which she has been taking for migraine prophylaxis as well). She has Relpax for migraine rescue. She is aware of [place] driving laws to stop driving after a seizure until 6 months seizure-free. She will follow-up in 6 months and knows to call for any changes.    Follow Up Instructions:   -I discussed the assessment and treatment plan with the patient. The patient was provided an opportunity to ask questions and all were answered. The patient agreed with the plan and demonstrated an understanding of the instructions.   The patient was advised to call back or seek an in-person evaluation if the symptoms worsen or if the condition fails to improve as anticipated.    Total Time spent in visit with the patient was:  9:33 minutes, of which 100% of the time was spent in counseling and/or coordinating care on the above.   Pt understands and agrees with the plan of  care outlined.     Cameron Sprang, MD

## 2019-10-24 NOTE — Telephone Encounter (Signed)
Opened in error

## 2019-12-01 ENCOUNTER — Other Ambulatory Visit (HOSPITAL_COMMUNITY): Payer: Self-pay | Admitting: Psychiatry

## 2019-12-04 ENCOUNTER — Other Ambulatory Visit: Payer: Self-pay | Admitting: Neurology

## 2019-12-04 MED ORDER — PHENOBARBITAL 64.8 MG PO TABS: 64.8000 mg | ORAL_TABLET | Freq: Two times a day (BID) | ORAL | 3 refills | Status: DC

## 2019-12-11 ENCOUNTER — Ambulatory Visit (HOSPITAL_COMMUNITY): Payer: 59 | Admitting: Psychiatry

## 2019-12-18 ENCOUNTER — Other Ambulatory Visit: Payer: Self-pay

## 2019-12-18 ENCOUNTER — Telehealth (INDEPENDENT_AMBULATORY_CARE_PROVIDER_SITE_OTHER): Payer: 59 | Admitting: Psychiatry

## 2019-12-18 DIAGNOSIS — F341 Dysthymic disorder: Secondary | ICD-10-CM | POA: Diagnosis not present

## 2019-12-18 MED ORDER — FETZIMA 40 MG PO CP24: 40.0000 mg | ORAL_CAPSULE | ORAL | 2 refills | Status: AC

## 2019-12-18 MED ORDER — HYDROXYZINE PAMOATE 25 MG PO CAPS: ORAL_CAPSULE | ORAL | 5 refills | Status: DC

## 2019-12-18 MED ORDER — LEVOMILNACIPRAN HCL ER 20 MG PO CP24: 20.0000 mg | ORAL_CAPSULE | Freq: Every day | ORAL | 2 refills | Status: DC

## 2019-12-18 MED ORDER — FETZIMA 40 MG PO CP24: 40.0000 mg | ORAL_CAPSULE | ORAL | 2 refills | Status: DC

## 2019-12-18 NOTE — Progress Notes (Signed)
Pain management  Psychiatric Initial Adult Assessment   Patient Identification: Samantha Atkins MRN:  [id number] Date of Evaluation:  12/18/2019 Referral Source: Dr  Michele Mcalpine Chief Complaint:  Chronic depression Visit Diagnosis adjustment disorder with depression  Today the patient shares that she feels worse.  She is having problems with her hip.  The reality of having hip surgery is becoming more more evident.  The patient is in pain.  The more importantly is that she does not have the flexibility to do yoga.  She says yoga really helps her emotionally as well as physically.  As a whole she feels dysphoric and sad.  She has periods where she is very depressed for a day or 2 then feels tired afterwards and then recovers.  She never has depression longer than just a day or 2.  But she feels bad during those times very bad.  She says his fatigue is depression.  She never gets suicidal.  This all occurred since her hip pain is worsened the reality of surgery looks apparent.  Patient says she is eating okay.  Her energy level is fair.  Her thinking is clear.  She denies use of alcohol or drugs has never been psychotic.  She claims that the antidepressant has been very helpful and only lately has she felt somewhat of a decline.  Importantly she is not sleeping well. Associated Signs/Symptoms: Depression Symptoms:  depressed mood, (Hypo) Manic Symptoms:   Anxiety Symptoms:   Psychotic Symptoms:   PTSD Symptoms:   Past Psychiatric History: Prozac, Zoloft now on Lexapro 1 psychiatric hospitalization 2 decades ago  Previous Psychotropic Medications: Lexapro 10 mg, Ativan 1 mg as needed  Substance Abuse History in the last 12 months:  No.  Consequences of Substance Abuse:   Past Medical History:  Past Medical History:  Diagnosis Date  . Anxiety   . Arthritis    hands  . Cancer (HCC)    HISTORY OF OVARIAN CANCER  . Depression   . Fracture of humerus, proximal, left, closed 08/07/2015  . Fracture,  humerus closed 07-31-15 fell   left  . GERD (gastroesophageal reflux disease)   . Headache   . Insomnia   . Migraines   . Osteoporosis   . S/P right hip fracture 1990   fractures X 2  . Seizures ([place])     Past Surgical History:  Procedure Laterality Date  . ABDOMINAL HYSTERECTOMY    . FEMUR FRACTURE SURGERY Left    fell, redo hip replacement  . JOINT REPLACEMENT Bilateral    hip  . ORIF DISTAL RADIUS FRACTURE Left 03-2015  . ORIF HUMERUS FRACTURE Left 08/07/2015   Procedure: LEFT OPEN REDUCTION INTERNAL FIXATION (ORIF) PROXIMAL HUMERUS FRACTURE;  Surgeon: Marchia Bond, MD;  Location: [facility];  Service: Orthopedics;  Laterality: Left;  . TONSILLECTOMY    . TOTAL HIP REVISION Left 02/14/2017   Procedure: LEFT ORIF PERIPROSTHETIC FEMORAL FRACTURE AND REVISION HIP ARTHROPLASTY;  Surgeon: Paralee Cancel, MD;  Location: [facility];  Service: Orthopedics;  Laterality: Left;    Family Psychiatric History:   Family History:  Family History  Problem Relation Age of Onset  . Hip fracture Mother   . Heart Problems Father   . Alcohol abuse Father   . Drug abuse Father   . Anxiety disorder Father   . Parkinson's disease Brother   . Healthy Daughter     Social History:   Social History   Socioeconomic History  . Marital status: Married  Spouse name: Not on file  . Number of children: Not on file  . Years of education: Not on file  . Highest education level: Not on file  Occupational History  . Not on file  Tobacco Use  . Smoking status: Never Smoker  . Smokeless tobacco: Never Used  Substance and Sexual Activity  . Alcohol use: No  . Drug use: No  . Sexual activity: Not Currently  Other Topics Concern  . Not on file  Social History Narrative   Right handed      Associates Degree      Lives with husband in one story home       Social Determinants of Health   Financial Resource Strain:   . Difficulty of Paying Living Expenses:   Food Insecurity:    . Worried About Charity fundraiser in the Last Year:   . Arboriculturist in the Last Year:   Transportation Needs:   . Film/video editor (Medical):   Marland Kitchen Lack of Transportation (Non-Medical):   Physical Activity:   . Days of Exercise per Week:   . Minutes of Exercise per Session:   Stress:   . Feeling of Stress :   Social Connections:   . Frequency of Communication with Friends and Family:   . Frequency of Social Gatherings with Friends and Family:   . Attends Religious Services:   . Active Member of Clubs or Organizations:   . Attends Archivist Meetings:   Marland Kitchen Marital Status:     Additional Social History:   Allergies:   Allergies  Allergen Reactions  . Dust Mite Extract Cough    Metabolic Disorder Labs: No results found for: HGBA1C, MPG No results found for: PROLACTIN No results found for: CHOL, TRIG, HDL, CHOLHDL, VLDL, LDLCALC   Current Medications: Current Outpatient Medications  Medication Sig Dispense Refill  . calcium citrate-vitamin D (CITRACAL+D) 315-200 MG-UNIT tablet Take 1 tablet by mouth 2 (two) times daily.    . cholecalciferol (VITAMIN D) 1000 units tablet Take 1,000 Units by mouth daily.    Marland Kitchen eletriptan (RELPAX) 20 MG tablet Take 1 tablet at onset of migraine. Do not take more than 2-3 a week 10 tablet 11  . feeding supplement (BOOST HIGH PROTEIN) LIQD Take 1 Container by mouth daily.    . ferrous sulfate 325 (65 FE) MG EC tablet Take 1 tablet (325 mg total) by mouth every other day. (Patient taking differently: Take 325 mg by mouth daily with breakfast. ) 60 tablet 0  . hydrOXYzine (VISTARIL) 25 MG capsule 2  qhs 60 capsule 5  . levETIRAcetam (KEPPRA) 500 MG tablet Take 1 tablet at bedtime 90 tablet 3  . Levomilnacipran HCl ER (FETZIMA) 40 MG CP24 Take 40 mg by mouth 1 day or 1 dose for 1 dose. 30 capsule 2  . Levomilnacipran HCl ER 20 MG CP24 Take 20 mg by mouth daily. 30 capsule 2  . LORazepam (ATIVAN) 1 MG tablet Take 1 tablet (1 mg  total) by mouth every 4 (four) hours as needed for anxiety. 30 tablet 0  . Multiple Vitamin (MULTIVITAMIN WITH MINERALS) TABS tablet Take 1 tablet by mouth daily.    Marland Kitchen omeprazole (PRILOSEC) 20 MG capsule Take 20 mg by mouth daily.    Marland Kitchen PHENobarbital (LUMINAL) 64.8 MG tablet Take 1 tablet (64.8 mg total) by mouth 2 (two) times daily. 180 tablet 3  . potassium chloride SA (K-DUR,KLOR-CON) 20 MEQ tablet Take 20 mEq by  mouth daily.    . pregabalin (LYRICA) 150 MG capsule Take 1 capsule (150 mg total) by mouth 2 (two) times daily. 180 capsule 3  . psyllium (HYDROCIL/METAMUCIL) 95 % PACK Take 1 packet by mouth daily.    Marland Kitchen senna-docusate (SENOKOT-S) 8.6-50 MG tablet Take 1 tablet by mouth at bedtime. 30 tablet 0   No current facility-administered medications for this visit.    Neurologic: Headache: No Seizure: Yes Paresthesias:No  Musculoskeletal: Strength & Muscle Tone: decreased Gait & Station: unable to stand Patient leans: N/A  Psychiatric Specialty Exam: ROS  There were no vitals taken for this visit.There is no height or weight on file to calculate BMI.  General Appearance: Fairly Groomed  Eye Contact:    Speech:  Clear and Coherent  Volume:  Normal  Mood:  Anxious  Affect:  Congruent  Thought Process:  Goal Directed  Orientation:  Full (Time, Place, and Person)  Thought Content:  Logical  Suicidal Thoughts:  No  Homicidal Thoughts:  No  Memory:  NA  Judgement:  Good  Insight:  Good  Psychomotor Activity:  Normal  Concentration:    Recall:  [place] of Knowledge:Good  Language: Good  Akathisia:  No  Handed:  Right  AIMS (if indicated):    Assets:  Desire for Improvement  ADL's:  Intact  Cognition: WNL  Sleep:      Treatment Plan Summary: 5/12/20213:56 PM   This patient has 2 problems.  She has persistent depression disorder and is responding to [facility].  At this time again increase the dose from 20 to 40 mg.  Her second problem is insomnia.  At this time we  will ask her to double her Hydroxacen taking 25 mg 2 at night.  This patient will be seen again in 2 months.  She is not suicidal.  She is functioning fairly well.

## 2020-01-13 ENCOUNTER — Other Ambulatory Visit: Payer: Self-pay | Admitting: Neurology

## 2020-01-17 ENCOUNTER — Other Ambulatory Visit (HOSPITAL_COMMUNITY): Payer: Self-pay | Admitting: Psychiatry

## 2020-01-29 ENCOUNTER — Encounter: Payer: Self-pay | Admitting: Neurology

## 2020-01-29 ENCOUNTER — Other Ambulatory Visit: Payer: Self-pay

## 2020-01-29 ENCOUNTER — Ambulatory Visit: Payer: Managed Care, Other (non HMO) | Admitting: Neurology

## 2020-01-29 VITALS — BP 128/78 | HR 70 | Ht 60.0 in | Wt 88.6 lb

## 2020-01-29 DIAGNOSIS — G40001 Localization-related (focal) (partial) idiopathic epilepsy and epileptic syndromes with seizures of localized onset, not intractable, with status epilepticus: Secondary | ICD-10-CM

## 2020-01-29 DIAGNOSIS — G43009 Migraine without aura, not intractable, without status migrainosus: Secondary | ICD-10-CM

## 2020-01-29 MED ORDER — PHENOBARBITAL 64.8 MG PO TABS: 64.8000 mg | ORAL_TABLET | Freq: Two times a day (BID) | ORAL | 3 refills | Status: DC

## 2020-01-29 MED ORDER — ELETRIPTAN HYDROBROMIDE 20 MG PO TABS: ORAL_TABLET | ORAL | 11 refills | Status: DC

## 2020-01-29 MED ORDER — LEVETIRACETAM 500 MG PO TABS: ORAL_TABLET | ORAL | 3 refills | Status: DC

## 2020-01-29 MED ORDER — PREGABALIN 150 MG PO CAPS: 150.0000 mg | ORAL_CAPSULE | Freq: Two times a day (BID) | ORAL | 3 refills | Status: DC

## 2020-01-29 NOTE — Progress Notes (Signed)
NEUROLOGY FOLLOW UP OFFICE NOTE  Kristyne Woodring [id number] 1951-06-11  HISTORY OF PRESENT ILLNESS: I had the pleasure of seeing Zyion Leidner in follow-up in the neurology clinic on 01/29/2020.  The patient was last evaluated in the neurology clinic via telephonic communication 4 months ago for seizures. On her last visit, she reported doing well seizure-free since June 2019 and we agreed to wean off Levetiracetam to streamline medications. She reported that once off LEV, she felt she may be having seizures at night, being very restless, then waking up feeling cloudy. We restarted Levetiracetam 500mg  qhs, in addition to Phenobarbital 64.8mg  BID and Lyrica 150mg  BID. She denies any further "flopping around and sweating," but also feels that these were happening around the time she had a walking pneumonia, so she is unsure if LEV is helping. She denies any side effects to medications. She denies any staring/unresponsive episodes, olfactory/gustatory hallucinations, focal numbness/tinglin. She has bilateral rods in her hips and femur, with left leg length discrepancy. No falls. She has had severe tendinitis running up and down her left side, it is taking her a longer time to recover so she has not been able to do her yoga. This is affecting her mood. She denies any migraines since last visit, she has not needed prn Relpax. She gets Prolia shots twice a year and takes daily calcium with vitamin D.   History on Initial Assessment 03/02/2018: This is a pleasant 69 year old right-handed woman with a history of osteoporosis, depression, GERD, presenting for hospital follow-up after hospital admission for status epilepticus. Records were reviewed. She was admitted for a fall in July 2018 and was found to have a left hip fracture. She had no recollection of the fall and was getting ready for surgery when she had an episode of staring and unresponsiveness with some hand wringing movements. On further questioning, she had  been having these episodes for several years which her husband had attributed to her underlying psychiatric condition. She has severe depression and anxiety. Her husband reported multiple falls, they were in the bedroom one time and he noted an awkward pause in conversation then she "fell like a tree." No convulsive activity noted. She had an EEG which showed mild to moderate diffuse slowing, no epileptiform discharges seen. CT head showed diffuse cerebral and cerebellar atrophy, chronic microvascular disease. She was discharged on Keppra 500mg  BID. She was brought back to the hospital last 01/29/18 for status epilepticus. Her husband found her partway off the bed unresponsive, foaming at the mouth. She was given IV Versed, then had another seizure and was given IV Ativan. ER noted eyelid twitching and right gaze deviation, as well as right arm and leg twitching. She underwent 5 days of inpatient video EEG monitoring, initial EEG showed focal slowing on the right posterior region with runs of brief rhythmic activity in the left frontal region. She had worsening changes on EEG, with electrographic seizures arising from the right anterior frontal and paracentral region. Several seizure medications were started, with complete resolution of right PLEDs on addition of phenobarbital. There was also occasional left posterior tempora/parietal epileptiform discharges seen. I personally reviewed MRI brain without contrast which did not show any acute changes, there was moderate brain atrophy, mild chronic microvascular disease. She had a lumbar puncture with slightly elevated protein of 78, negative HSV. Over the course of her hospital stay, Keppra and Dilantin were discontinued, she was discharged home on Phenobarbital 60mg  BID and Lyrica 150mg  every 8  hours. Her husband denies any further seizures since hospital discharge 2 weeks ago. Her husband fills her pillbox but she takes medications herself, occasionally missing the  noon dose of Lyrica. She denies any olfactory/gustatory hallucinations, deja vu, rising epigastric sensation, focal numbness/tingling/weakness, myoclonic jerks. She has tingling in both feet and reports a history of leg length discrepancy. She has a history of migraines with severe migraines 2-3 times a week, lasting 3-5 days. They used to be over the frontal region but lately have been in the back of her head, with nausea, vomiting, photo/phonophobia. She denies any migraine aura or visual obscurations. No family history of migraines. She has a lot of neck pain and shoulder pain. No dizziness, diplopia, dysarthria/dysphagia, bowel/bladder dysfunction. She initially had some urinary incontinence which is better. She has been using her walker since a fall in 2013 where she broke her right leg. She has been dealing with a lot of stress and depression, worsening her headaches. She takes Fioricet every 6 hours as needed and states she does not take it daily. She reports severe depression and insomnia, she reports Sertraline was stopped in the hospital and asks about resuming medication. Melatonin did not help with sleep.   Epilepsy Risk Factors:  She had a normal birth and early development.  There is no history of febrile convulsions, CNS infections such as meningitis/encephalitis, significant traumatic brain injury, neurosurgical procedures, or family history of seizures.    PAST MEDICAL HISTORY: Past Medical History:  Diagnosis Date  . Anxiety   . Arthritis    hands  . Cancer (HCC)    HISTORY OF OVARIAN CANCER  . Depression   . Fracture of humerus, proximal, left, closed 08/07/2015  . Fracture, humerus closed 07-31-15 fell   left  . GERD (gastroesophageal reflux disease)   . Headache   . Insomnia   . Migraines   . Osteoporosis   . S/P right hip fracture 1990   fractures X 2  . Seizures ([place])     MEDICATIONS: Current Outpatient Medications on File Prior to Visit  Medication Sig Dispense  Refill  . calcium citrate-vitamin D (CITRACAL+D) 315-200 MG-UNIT tablet Take 1 tablet by mouth 2 (two) times daily.    . cholecalciferol (VITAMIN D) 1000 units tablet Take 1,000 Units by mouth daily.    Marland Kitchen eletriptan (RELPAX) 20 MG tablet TAKE 1 TABLET AT ONSET OF MIGRAINE. DO NOT TAKE MORE THAN 2-3 A WEEK 10 tablet 0  . feeding supplement (BOOST HIGH PROTEIN) LIQD Take 1 Container by mouth daily.    . ferrous sulfate 325 (65 FE) MG EC tablet Take 1 tablet (325 mg total) by mouth every other day. (Patient taking differently: Take 325 mg by mouth daily with breakfast. ) 60 tablet 0  . FETZIMA 40 MG CP24 Take 1 capsule by mouth daily.    . hydrOXYzine (VISTARIL) 25 MG capsule TAKE 1 CAPSULE BY MOUTH EVERY DAY AT BEDTIME 90 capsule 2  . levETIRAcetam (KEPPRA) 500 MG tablet Take 1 tablet at bedtime 90 tablet 3  . Levomilnacipran HCl ER 20 MG CP24 Take 20 mg by mouth daily. 30 capsule 2  . LORazepam (ATIVAN) 1 MG tablet Take 1 tablet (1 mg total) by mouth every 4 (four) hours as needed for anxiety. 30 tablet 0  . Multiple Vitamin (MULTIVITAMIN WITH MINERALS) TABS tablet Take 1 tablet by mouth daily.    Marland Kitchen omeprazole (PRILOSEC) 20 MG capsule Take 20 mg by mouth daily.    Marland Kitchen PHENobarbital (LUMINAL)  64.8 MG tablet Take 1 tablet (64.8 mg total) by mouth 2 (two) times daily. 180 tablet 3  . potassium chloride SA (K-DUR,KLOR-CON) 20 MEQ tablet Take 20 mEq by mouth daily.    . pregabalin (LYRICA) 150 MG capsule Take 1 capsule (150 mg total) by mouth 2 (two) times daily. 180 capsule 3  . psyllium (HYDROCIL/METAMUCIL) 95 % PACK Take 1 packet by mouth daily.    Marland Kitchen senna-docusate (SENOKOT-S) 8.6-50 MG tablet Take 1 tablet by mouth at bedtime. 30 tablet 0   No current facility-administered medications on file prior to visit.    ALLERGIES: Allergies  Allergen Reactions  . Dust Mite Extract Cough    FAMILY HISTORY: Family History  Problem Relation Age of Onset  . Hip fracture Mother   . Heart Problems  Father   . Alcohol abuse Father   . Drug abuse Father   . Anxiety disorder Father   . Parkinson's disease Brother   . Healthy Daughter     SOCIAL HISTORY: Social History   Socioeconomic History  . Marital status: Married    Spouse name: Not on file  . Number of children: Not on file  . Years of education: Not on file  . Highest education level: Not on file  Occupational History  . Not on file  Tobacco Use  . Smoking status: Never Smoker  . Smokeless tobacco: Never Used  Vaping Use  . Vaping Use: Never used  Substance and Sexual Activity  . Alcohol use: No  . Drug use: No  . Sexual activity: Not Currently  Other Topics Concern  . Not on file  Social History Narrative   Right handed      Associates Degree      Lives with husband in one story home       Social Determinants of Health   Financial Resource Strain:   . Difficulty of Paying Living Expenses:   Food Insecurity:   . Worried About Charity fundraiser in the Last Year:   . Arboriculturist in the Last Year:   Transportation Needs:   . Film/video editor (Medical):   Marland Kitchen Lack of Transportation (Non-Medical):   Physical Activity:   . Days of Exercise per Week:   . Minutes of Exercise per Session:   Stress:   . Feeling of Stress :   Social Connections:   . Frequency of Communication with Friends and Family:   . Frequency of Social Gatherings with Friends and Family:   . Attends Religious Services:   . Active Member of Clubs or Organizations:   . Attends Archivist Meetings:   Marland Kitchen Marital Status:   Intimate Partner Violence:   . Fear of Current or Ex-Partner:   . Emotionally Abused:   Marland Kitchen Physically Abused:   . Sexually Abused:     PHYSICAL EXAM: Vitals:   01/29/20 1111  BP: 128/78  Pulse: 70  SpO2: 99%   General: No acute distress Head:  Normocephalic/atraumatic Skin/Extremities: No rash, no edema Neurological Exam: alert and oriented to person, place, and time. No aphasia or  dysarthria. Fund of knowledge is appropriate.  Recent and remote memory are intact.  Attention and concentration are normal. . Cranial nerves: Pupils equal, round, reactive to light. Extraocular movements intact with no nystagmus. Visual fields full. No facial asymmetry. Motor: Bulk and tone normal, muscle strength 5/5 throughout with no pronator drift.   Finger to nose testing intact.  Gait narrow-based and steady, she  has a walker but is able to ambulate a few steps without walker.    IMPRESSION: This is a pleasant 69 yo RH woman with a history of depression, anxiety, focal to bilateral tonic-clonic epilepsy, etiology unclear. MRI brain no acute changes. She was in status epilepticus in June 2019 with electrographic seizures arising from the right anterior frontal and paracentral region, then right-sided PLEDs. There were also occasional left posterior temporal/parietal epileptiform discharges seen. She was reporting episodes of feeling restless at night and waking up feeling cloudy when LEV was weaned off, but now feels she had been sick with pneumonia at that time, which may have caused symptoms. We agreed to continue on current doses of Levetiracetam 500mg  qhs, Phenobarbital 64.8mg  BID, and Lyrica 150mg  BID (also for migraine prophylaxis) for now. She is aware of [place] driving laws to stop driving until 6 months seizure-free. She has prn Relpax for migraines which are currently well-controlled. Follow-up in 6 months, she knows to call for any changes.    Thank you for allowing me to participate in her care.  Please do not hesitate to call for any questions or concerns.   Ellouise Newer, M.D.   CC: Dr. Sabra Heck

## 2020-01-29 NOTE — Patient Instructions (Signed)
Always good to see you! Continue all your medications, refills have been sent. Follow-up in 6 months, call for any changes.  Seizure Precautions: 1. If medication has been prescribed for you to prevent seizures, take it exactly as directed.  Do not stop taking the medicine without talking to your doctor first, even if you have not had a seizure in a long time.   2. Avoid activities in which a seizure would cause danger to yourself or to others.  Don't operate dangerous machinery, swim alone, or climb in high or dangerous places, such as on ladders, roofs, or girders.  Do not drive unless your doctor says you may.  3. If you have any warning that you may have a seizure, lay down in a safe place where you can't hurt yourself.    4.  No driving for 6 months from last seizure, as per [facility].   Please refer to the following link on the [place] website for more information: [email or web address]   5.  Maintain good sleep hygiene. Avoid alcohol.  6.  Contact your doctor if you have any problems that may be related to the medicine you are taking.  7.  Call 911 and bring the patient back to the ED if:        A.  The seizure lasts longer than 5 minutes.       B.  The patient doesn't awaken shortly after the seizure  C.  The patient has new problems such as difficulty seeing, speaking or moving  D.  The patient was injured during the seizure  E.  The patient has a temperature over 102 F (39C)  F.  The patient vomited and now is having trouble breathing

## 2020-02-25 ENCOUNTER — Other Ambulatory Visit (HOSPITAL_COMMUNITY): Payer: Self-pay | Admitting: *Deleted

## 2020-02-25 MED ORDER — FETZIMA 40 MG PO CP24: 1.0000 | ORAL_CAPSULE | Freq: Every day | ORAL | 0 refills | Status: DC

## 2020-02-26 ENCOUNTER — Telehealth (HOSPITAL_COMMUNITY): Payer: Self-pay | Admitting: *Deleted

## 2020-02-26 NOTE — Telephone Encounter (Signed)
Opened in error

## 2020-03-06 ENCOUNTER — Other Ambulatory Visit: Payer: Self-pay

## 2020-03-06 ENCOUNTER — Telehealth (INDEPENDENT_AMBULATORY_CARE_PROVIDER_SITE_OTHER): Payer: 59 | Admitting: Psychiatry

## 2020-03-06 DIAGNOSIS — F324 Major depressive disorder, single episode, in partial remission: Secondary | ICD-10-CM

## 2020-03-06 MED ORDER — HYDROXYZINE PAMOATE 25 MG PO CAPS: ORAL_CAPSULE | ORAL | 2 refills | Status: DC

## 2020-03-06 MED ORDER — FETZIMA 40 MG PO CP24: 1.0000 | ORAL_CAPSULE | Freq: Every day | ORAL | 2 refills | Status: DC

## 2020-03-06 NOTE — Progress Notes (Signed)
Pain management  Psychiatric Initial Adult Assessment   Patient Identification: Samantha Atkins MRN:  [id number] Date of Evaluation:  03/06/2020 Referral Source: Dr  Michele Mcalpine Chief Complaint:  Chronic depression Visit Diagnosis adjustment disorder with depression     At this time the patient is not doing all that well.  She is somewhat resilient but she is having a lot of pain.  This is pain in her foot.  She has some form of tendinitis or some type of inflammatory process.  She does not have a broken bone.  Patient tries to stay active as she can.  She is sleeping fairly well and she is eating well.  She has a cat that she takes care of.  On her last visit we increased her Fetzima to 40 mg which she says is helpful.  She says she feels a little bit better.  She also takes Vistaril 25 mg 2 at night that helps her sleep.  Her appetite is good and her energy level is only fair.  She drinks no alcohol uses no drugs.  She does not have chest pain shortness of breath.  She has no neurological symptoms.  She is not in therapy.  She still is dealing with the issue that she is having physical problems which really Effexor.  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  This patient will continue taking her medicines and she will see me again in a few months.   Associated Signs/Symptoms: Depression Symptoms:  depressed mood, (Hypo) Manic Symptoms:   Anxiety Symptoms:   Psychotic Symptoms:   PTSD Symptoms:   Past Psychiatric History: Prozac, Zoloft now on Lexapro 1 psychiatric hospitalization 2 decades ago  Previous Psychotropic Medications: Lexapro 10 mg, Ativan 1 mg as needed  Substance Abuse History in the last 12 months:  No.  Consequences of Substance Abuse:   Past Medical History:  Past Medical History:  Diagnosis Date   Anxiety    Arthritis    hands   Cancer ([facility])    HISTORY OF OVARIAN CANCER   Depression    Fracture of  humerus, proximal, left, closed 08/07/2015   Fracture, humerus closed 07-31-15 fell   left   GERD (gastroesophageal reflux disease)    Headache    Insomnia    Migraines    Osteoporosis    S/P right hip fracture 1990   fractures X 2   Seizures ([facility])     Past Surgical History:  Procedure Laterality Date   ABDOMINAL HYSTERECTOMY     FEMUR FRACTURE SURGERY Left    fell, redo hip replacement   JOINT REPLACEMENT Bilateral    hip   ORIF DISTAL RADIUS FRACTURE Left 03-2015   ORIF HUMERUS FRACTURE Left 08/07/2015   Procedure: LEFT OPEN REDUCTION INTERNAL FIXATION (ORIF) PROXIMAL HUMERUS FRACTURE;  Surgeon: Marchia Bond, MD;  Location: [facility];  Service: Orthopedics;  Laterality: Left;   TONSILLECTOMY     TOTAL HIP REVISION Left 02/14/2017   Procedure: LEFT ORIF PERIPROSTHETIC FEMORAL FRACTURE AND REVISION HIP ARTHROPLASTY;  Surgeon: Paralee Cancel, MD;  Location: [facility];  Service: Orthopedics;  Laterality: Left;    Family Psychiatric History:   Family History:  Family History  Problem Relation Age of Onset   Hip fracture Mother    Heart Problems Father    Alcohol abuse Father    Drug abuse Father    Anxiety disorder Father  Parkinson's disease Brother    Healthy Daughter     Social History:   Social History   Socioeconomic History   Marital status: Married    Spouse name: Not on file   Number of children: Not on file   Years of education: Not on file   Highest education level: Not on file  Occupational History   Not on file  Tobacco Use   Smoking status: Never Smoker   Smokeless tobacco: Never Used  Vaping Use   Vaping Use: Never used  Substance and Sexual Activity   Alcohol use: No   Drug use: No   Sexual activity: Not Currently  Other Topics Concern   Not on file  Social History Narrative   Right handed      Associates Degree      Lives with husband in one story home       Social Determinants of Health    Financial Resource Strain:    Difficulty of Paying Living Expenses:   Food Insecurity:    Worried About Charity fundraiser in the Last Year:    Arboriculturist in the Last Year:   Transportation Needs:    Film/video editor (Medical):    Lack of Transportation (Non-Medical):   Physical Activity:    Days of Exercise per Week:    Minutes of Exercise per Session:   Stress:    Feeling of Stress :   Social Connections:    Frequency of Communication with Friends and Family:    Frequency of Social Gatherings with Friends and Family:    Attends Religious Services:    Active Member of Clubs or Organizations:    Attends Archivist Meetings:    Marital Status:     Additional Social History:   Allergies:   Allergies  Allergen Reactions   Dust Mite Extract Cough    Metabolic Disorder Labs: No results found for: HGBA1C, MPG No results found for: PROLACTIN No results found for: CHOL, TRIG, HDL, CHOLHDL, VLDL, LDLCALC   Current Medications: Current Outpatient Medications  Medication Sig Dispense Refill   calcium citrate-vitamin D (CITRACAL+D) 315-200 MG-UNIT tablet Take 1 tablet by mouth 2 (two) times daily.     cholecalciferol (VITAMIN D) 1000 units tablet Take 1,000 Units by mouth daily.     eletriptan (RELPAX) 20 MG tablet May repeat in 2 hours if headache persists or recurs. 10 tablet 11   feeding supplement (BOOST HIGH PROTEIN) LIQD Take 1 Container by mouth daily.     ferrous sulfate 325 (65 FE) MG EC tablet Take 1 tablet (325 mg total) by mouth every other day. (Patient taking differently: Take 325 mg by mouth daily with breakfast. ) 60 tablet 0   FETZIMA 40 MG CP24 Take 1 capsule by mouth daily. 90 capsule 2   hydrOXYzine (VISTARIL) 25 MG capsule 2  qhs 180 capsule 2   levETIRAcetam (KEPPRA) 500 MG tablet Take 1 tablet at bedtime 90 tablet 3   LORazepam (ATIVAN) 1 MG tablet Take 1 tablet (1 mg total) by mouth every 4 (four) hours as  needed for anxiety. 30 tablet 0   Multiple Vitamin (MULTIVITAMIN WITH MINERALS) TABS tablet Take 1 tablet by mouth daily.     omeprazole (PRILOSEC) 20 MG capsule Take 20 mg by mouth daily.     PHENobarbital (LUMINAL) 64.8 MG tablet Take 1 tablet (64.8 mg total) by mouth 2 (two) times daily. 180 tablet 3   potassium chloride SA (K-DUR,KLOR-CON)  20 MEQ tablet Take 20 mEq by mouth daily.     pregabalin (LYRICA) 150 MG capsule Take 1 capsule (150 mg total) by mouth 2 (two) times daily. 180 capsule 3   psyllium (HYDROCIL/METAMUCIL) 95 % PACK Take 1 packet by mouth daily.     senna-docusate (SENOKOT-S) 8.6-50 MG tablet Take 1 tablet by mouth at bedtime. 30 tablet 0   No current facility-administered medications for this visit.    Neurologic: Headache: No Seizure: Yes Paresthesias:No  Musculoskeletal: Strength & Muscle Tone: decreased Gait & Station: unable to stand Patient leans: N/A  Psychiatric Specialty Exam: ROS  There were no vitals taken for this visit.There is no height or weight on file to calculate BMI.  General Appearance: Fairly Groomed  Eye Contact:    Speech:  Clear and Coherent  Volume:  Normal  Mood:  Anxious  Affect:  Congruent  Thought Process:  Goal Directed  Orientation:  Full (Time, Place, and Person)  Thought Content:  Logical  Suicidal Thoughts:  No  Homicidal Thoughts:  No  Memory:  NA  Judgement:  Good  Insight:  Good  Psychomotor Activity:  Normal  Concentration:    Recall:  [place] of Knowledge:Good  Language: Good  Akathisia:  No  Handed:  Right  AIMS (if indicated):    Assets:  Desire for Improvement  ADL's:  Intact  Cognition: WNL  Sleep:      Treatment Plan Summary: 7/30/202110:33 AM   This patient has 2 problems she has a major depressive episode and she is doing somewhat better on a higher dose of Fetzima.  She takes 40 mg.  Her second problem is insomnia.  The patient does well on Vistaril 25 mg 2 at night.  Clearly the  patient is dealing with pain and limitations from it.  She assures me that she has good head on her shoulder and that she is been worse pain in the past.  This patient will be seen again in 2 to 3 months.

## 2020-03-14 ENCOUNTER — Other Ambulatory Visit: Payer: Self-pay | Admitting: Neurology

## 2020-03-19 ENCOUNTER — Other Ambulatory Visit: Payer: Self-pay | Admitting: Neurology

## 2020-06-05 ENCOUNTER — Ambulatory Visit (HOSPITAL_COMMUNITY): Payer: 59 | Admitting: Psychiatry

## 2020-06-05 ENCOUNTER — Other Ambulatory Visit: Payer: Self-pay

## 2020-06-05 ENCOUNTER — Telehealth (INDEPENDENT_AMBULATORY_CARE_PROVIDER_SITE_OTHER): Payer: 59 | Admitting: Psychiatry

## 2020-06-05 DIAGNOSIS — F324 Major depressive disorder, single episode, in partial remission: Secondary | ICD-10-CM

## 2020-06-05 MED ORDER — HYDROXYZINE PAMOATE 25 MG PO CAPS: ORAL_CAPSULE | ORAL | 2 refills | Status: DC

## 2020-06-05 MED ORDER — FETZIMA 40 MG PO CP24: 1.0000 | ORAL_CAPSULE | Freq: Every day | ORAL | 2 refills | Status: DC

## 2020-06-05 NOTE — Progress Notes (Signed)
Pain management  Psychiatric Initial Adult Assessment   Patient Identification: Samantha Atkins MRN:  [id number] Date of Evaluation:  06/05/2020 Referral Source: Dr  Michele Mcalpine Chief Complaint:  Chronic depression Visit Diagnosis adjustment disorder with depression   Today the patient is doing fair.  She is dealing with the fact that she has had multiple fractures of her foot and ankle.  She has fractured her tibia.  Therefore she can barely put weight on it.  She does have a boot and she does walk around a little bit.  She is doing her own physical therapy as best she can.  Her husband [name] is very involved in taking care of her.  The patient's daughter is in [place] and takes care of the patient's mother.  Patient does feel dysphoric but she acknowledges it is really related to her physical state.  Generally she is actually sleeping fairly well says the Vistaril is very helpful.  Her appetite is good.  She feels fatigued.  She was feeling bored and empty but not so now.  She was feeling that way when she could not get out of bed.  Now he should get out of bed she feels better about that.  Patient watches a lot of TV.  She drinks no alcohol uses no drugs and she is not psychotic.  The patient denies being suicidal.  She knows this will be a long recovery but she says she is slowly getting better.  She is on the mend.  Patient takes her medicines just as prescribed.   Associated Signs/Symptoms: Depression Symptoms:  depressed mood, (Hypo) Manic Symptoms:   Anxiety Symptoms:   Psychotic Symptoms:   PTSD Symptoms:   Past Psychiatric History: Prozac, Zoloft now on Lexapro 1 psychiatric hospitalization 2 decades ago  Previous Psychotropic Medications: Lexapro 10 mg, Ativan 1 mg as needed  Substance Abuse History in the last 12 months:  No.  Consequences of Substance Abuse:   Past Medical History:  Past Medical History:  Diagnosis Date  . Anxiety   . Arthritis    hands  . Cancer ([facility])     HISTORY OF OVARIAN CANCER  . Depression   . Fracture of humerus, proximal, left, closed 08/07/2015  . Fracture, humerus closed 07-31-15 fell   left  . GERD (gastroesophageal reflux disease)   . Headache   . Insomnia   . Migraines   . Osteoporosis   . S/P right hip fracture 1990   fractures X 2  . Seizures ([facility])     Past Surgical History:  Procedure Laterality Date  . ABDOMINAL HYSTERECTOMY    . FEMUR FRACTURE SURGERY Left    fell, redo hip replacement  . JOINT REPLACEMENT Bilateral    hip  . ORIF DISTAL RADIUS FRACTURE Left 03-2015  . ORIF HUMERUS FRACTURE Left 08/07/2015   Procedure: LEFT OPEN REDUCTION INTERNAL FIXATION (ORIF) PROXIMAL HUMERUS FRACTURE;  Surgeon: Marchia Bond, MD;  Location: [facility];  Service: Orthopedics;  Laterality: Left;  . TONSILLECTOMY    . TOTAL HIP REVISION Left 02/14/2017   Procedure: LEFT ORIF PERIPROSTHETIC FEMORAL FRACTURE AND REVISION HIP ARTHROPLASTY;  Surgeon: Paralee Cancel, MD;  Location: [facility];  Service: Orthopedics;  Laterality: Left;    Family Psychiatric History:   Family History:  Family History  Problem Relation Age of Onset  . Hip fracture Mother   . Heart Problems Father   . Alcohol abuse Father   . Drug abuse Father   . Anxiety disorder Father   .  Parkinson's disease Brother   . Healthy Daughter     Social History:   Social History   Socioeconomic History  . Marital status: Married    Spouse name: Not on file  . Number of children: Not on file  . Years of education: Not on file  . Highest education level: Not on file  Occupational History  . Not on file  Tobacco Use  . Smoking status: Never Smoker  . Smokeless tobacco: Never Used  Vaping Use  . Vaping Use: Never used  Substance and Sexual Activity  . Alcohol use: No  . Drug use: No  . Sexual activity: Not Currently  Other Topics Concern  . Not on file  Social History Narrative   Right handed      Associates Degree      Lives with  husband in one story home       Social Determinants of Health   Financial Resource Strain:   . Difficulty of Paying Living Expenses: Not on file  Food Insecurity:   . Worried About Charity fundraiser in the Last Year: Not on file  . Ran Out of Food in the Last Year: Not on file  Transportation Needs:   . Lack of Transportation (Medical): Not on file  . Lack of Transportation (Non-Medical): Not on file  Physical Activity:   . Days of Exercise per Week: Not on file  . Minutes of Exercise per Session: Not on file  Stress:   . Feeling of Stress : Not on file  Social Connections:   . Frequency of Communication with Friends and Family: Not on file  . Frequency of Social Gatherings with Friends and Family: Not on file  . Attends Religious Services: Not on file  . Active Member of Clubs or Organizations: Not on file  . Attends Archivist Meetings: Not on file  . Marital Status: Not on file    Additional Social History:   Allergies:   Allergies  Allergen Reactions  . Dust Mite Extract Cough    Metabolic Disorder Labs: No results found for: HGBA1C, MPG No results found for: PROLACTIN No results found for: CHOL, TRIG, HDL, CHOLHDL, VLDL, LDLCALC   Current Medications: Current Outpatient Medications  Medication Sig Dispense Refill  . calcium citrate-vitamin D (CITRACAL+D) 315-200 MG-UNIT tablet Take 1 tablet by mouth 2 (two) times daily.    . cholecalciferol (VITAMIN D) 1000 units tablet Take 1,000 Units by mouth daily.    Marland Kitchen eletriptan (RELPAX) 20 MG tablet May repeat in 2 hours if headache persists or recurs. 10 tablet 11  . feeding supplement (BOOST HIGH PROTEIN) LIQD Take 1 Container by mouth daily.    . ferrous sulfate 325 (65 FE) MG EC tablet Take 1 tablet (325 mg total) by mouth every other day. (Patient taking differently: Take 325 mg by mouth daily with breakfast. ) 60 tablet 0  . FETZIMA 40 MG CP24 Take 1 capsule by mouth daily. 90 capsule 2  . hydrOXYzine  (VISTARIL) 25 MG capsule 2  qhs 180 capsule 2  . levETIRAcetam (KEPPRA) 500 MG tablet Take 1 tablet at bedtime 90 tablet 3  . LORazepam (ATIVAN) 1 MG tablet Take 1 tablet (1 mg total) by mouth every 4 (four) hours as needed for anxiety. 30 tablet 0  . Multiple Vitamin (MULTIVITAMIN WITH MINERALS) TABS tablet Take 1 tablet by mouth daily.    Marland Kitchen omeprazole (PRILOSEC) 20 MG capsule Take 20 mg by mouth daily.    Marland Kitchen  PHENobarbital (LUMINAL) 64.8 MG tablet Take 1 tablet (64.8 mg total) by mouth 2 (two) times daily. 180 tablet 3  . potassium chloride SA (K-DUR,KLOR-CON) 20 MEQ tablet Take 20 mEq by mouth daily.    . pregabalin (LYRICA) 150 MG capsule Take 1 capsule (150 mg total) by mouth 2 (two) times daily. 180 capsule 3  . psyllium (HYDROCIL/METAMUCIL) 95 % PACK Take 1 packet by mouth daily.    Marland Kitchen senna-docusate (SENOKOT-S) 8.6-50 MG tablet Take 1 tablet by mouth at bedtime. 30 tablet 0   No current facility-administered medications for this visit.    Neurologic: Headache: No Seizure: Yes Paresthesias:No  Musculoskeletal: Strength & Muscle Tone: decreased Gait & Station: unable to stand Patient leans: N/A  Psychiatric Specialty Exam: ROS  There were no vitals taken for this visit.There is no height or weight on file to calculate BMI.  General Appearance: Fairly Groomed  Eye Contact:    Speech:  Clear and Coherent  Volume:  Normal  Mood:  Anxious  Affect:  Congruent  Thought Process:  Goal Directed  Orientation:  Full (Time, Place, and Person)  Thought Content:  Logical  Suicidal Thoughts:  No  Homicidal Thoughts:  No  Memory:  NA  Judgement:  Good  Insight:  Good  Psychomotor Activity:  Normal  Concentration:    Recall:  [place] of Knowledge:Good  Language: Good  Akathisia:  No  Handed:  Right  AIMS (if indicated):    Assets:  Desire for Improvement  ADL's:  Intact  Cognition: WNL  Sleep:      Treatment Plan Summary: 10/29/202111:31 AM   This patient has 2  problems.  The first is that of major depression mild.  She takes Fetzima 40 mg and says it is very helpful.  Her second problem is insomnia.  She says taking the Vistaril at night is very helpful.  Overall the medications seem to be beneficial.  This patient will be reevaluated in 3 months.

## 2020-06-15 ENCOUNTER — Other Ambulatory Visit: Payer: Self-pay | Admitting: Neurology

## 2020-06-21 ENCOUNTER — Other Ambulatory Visit: Payer: Self-pay | Admitting: Neurology

## 2020-08-31 ENCOUNTER — Telehealth (INDEPENDENT_AMBULATORY_CARE_PROVIDER_SITE_OTHER): Payer: Managed Care, Other (non HMO) | Admitting: Neurology

## 2020-08-31 ENCOUNTER — Encounter: Payer: Self-pay | Admitting: Neurology

## 2020-08-31 ENCOUNTER — Other Ambulatory Visit: Payer: Self-pay

## 2020-08-31 VITALS — Ht 62.0 in | Wt 88.0 lb

## 2020-08-31 DIAGNOSIS — G43009 Migraine without aura, not intractable, without status migrainosus: Secondary | ICD-10-CM | POA: Diagnosis not present

## 2020-08-31 DIAGNOSIS — G40001 Localization-related (focal) (partial) idiopathic epilepsy and epileptic syndromes with seizures of localized onset, not intractable, with status epilepticus: Secondary | ICD-10-CM | POA: Diagnosis not present

## 2020-08-31 MED ORDER — LEVETIRACETAM 500 MG PO TABS: ORAL_TABLET | ORAL | 3 refills | Status: DC

## 2020-08-31 MED ORDER — PREGABALIN 150 MG PO CAPS: 150.0000 mg | ORAL_CAPSULE | Freq: Two times a day (BID) | ORAL | 3 refills | Status: DC

## 2020-08-31 MED ORDER — PHENOBARBITAL 64.8 MG PO TABS: 64.8000 mg | ORAL_TABLET | Freq: Two times a day (BID) | ORAL | 3 refills | Status: AC

## 2020-08-31 MED ORDER — ELETRIPTAN HYDROBROMIDE 20 MG PO TABS: ORAL_TABLET | ORAL | 11 refills | Status: AC

## 2020-08-31 NOTE — Progress Notes (Signed)
Telephone (Audio) Visit The purpose of this telephone visit is to provide medical care while limiting exposure to the novel coronavirus.    Consent was obtained for telephone visit:  Yes.   Answered questions that patient had about telehealth interaction:  Yes.   I discussed the limitations, risks, security and privacy concerns of performing an evaluation and management service by telephone. I also discussed with the patient that there may be a patient responsible charge related to this service. The patient expressed understanding and agreed to proceed.  Pt location: Home Physician Location: office Name of referring provider:  Kathyrn Lass, MD I connected with .Lawerance Cruel at patients initiation/request on 08/31/2020 at  8:30 AM EST by telephone and verified that I am speaking with the correct person using two identifiers.  Pt MRN:  [id number] Pt DOB:  1951/03/25   History of Present Illness:  The patient had a telephone visit on 08/31/2020. Unable to connect via video due to technical difficulties. She was last seen in the neurology clinic 7 months ago for seizures. Since her last visit, she continues to deny any seizures since June 2019. We had tried to wean off Levetiracetam previously but she felt very restless, waking up feeling cloudy. Levetiracetam 500mg  qhs was restarted, however on her last visit she reports that the symptoms she was having may have been due to pneumonia and not stopping medication. She is also on Phenobarbital 64.8mg  BID and Lyrica 150mg  BID without side effects. She denies any staring/unresponsive episodes. She has migraines every now and then, "not a big deal." She has prn Relpax for rescue. She seems slightly confused during the phone visit this morning, possibly drowsy, but after gathering herself would answer questions appropriately. When asked if her husband had any questions for me, she asks her husband "she's asking if she has any questions for you." She states  she has been really, really undone after fracturing her tibia at the beginning of this month, her leg is still in a cast.   History on Initial Assessment 03/02/2018: This is a pleasant 70 year old right-handed woman with a history of osteoporosis, depression, GERD, presenting for hospital follow-up after hospital admission for status epilepticus. Records were reviewed. She was admitted for a fall in July 2018 and was found to have a left hip fracture. She had no recollection of the fall and was getting ready for surgery when she had an episode of staring and unresponsiveness with some hand wringing movements. On further questioning, she had been having these episodes for several years which her husband had attributed to her underlying psychiatric condition. She has severe depression and anxiety. Her husband reported multiple falls, they were in the bedroom one time and he noted an awkward pause in conversation then she "fell like a tree." No convulsive activity noted. She had an EEG which showed mild to moderate diffuse slowing, no epileptiform discharges seen. CT head showed diffuse cerebral and cerebellar atrophy, chronic microvascular disease. She was discharged on Keppra 500mg  BID. She was brought back to the hospital last 01/29/18 for status epilepticus. Her husband found her partway off the bed unresponsive, foaming at the mouth. She was given IV Versed, then had another seizure and was given IV Ativan. ER noted eyelid twitching and right gaze deviation, as well as right arm and leg twitching. She underwent 5 days of inpatient video EEG monitoring, initial EEG showed focal slowing on the right posterior region with runs of brief rhythmic activity in  the left frontal region. She had worsening changes on EEG, with electrographic seizures arising from the right anterior frontal and paracentral region. Several seizure medications were started, with complete resolution of right PLEDs on addition of phenobarbital.  There was also occasional left posterior tempora/parietal epileptiform discharges seen. I personally reviewed MRI brain without contrast which did not show any acute changes, there was moderate brain atrophy, mild chronic microvascular disease. She had a lumbar puncture with slightly elevated protein of 78, negative HSV. Over the course of her hospital stay, Keppra and Dilantin were discontinued, she was discharged home on Phenobarbital 60mg  BID and Lyrica 150mg  every 8 hours. Her husband denies any further seizures since hospital discharge 2 weeks ago. Her husband fills her pillbox but she takes medications herself, occasionally missing the noon dose of Lyrica. She denies any olfactory/gustatory hallucinations, deja vu, rising epigastric sensation, focal numbness/tingling/weakness, myoclonic jerks. She has tingling in both feet and reports a history of leg length discrepancy. She has a history of migraines with severe migraines 2-3 times a week, lasting 3-5 days. They used to be over the frontal region but lately have been in the back of her head, with nausea, vomiting, photo/phonophobia. She denies any migraine aura or visual obscurations. No family history of migraines. She has a lot of neck pain and shoulder pain. No dizziness, diplopia, dysarthria/dysphagia, bowel/bladder dysfunction. She initially had some urinary incontinence which is better. She has been using her walker since a fall in 2013 where she broke her right leg. She has been dealing with a lot of stress and depression, worsening her headaches. She takes Fioricet every 6 hours as needed and states she does not take it daily. She reports severe depression and insomnia, she reports Sertraline was stopped in the hospital and asks about resuming medication. Melatonin did not help with sleep.   Epilepsy Risk Factors:  She had a normal birth and early development.  There is no history of febrile convulsions, CNS infections such as  meningitis/encephalitis, significant traumatic brain injury, neurosurgical procedures, or family history of seizures.     Observations/Objective:   Vitals:   08/31/20 0814  Weight: 88 lb (39.9 kg)  Height: 5\' 2"  (1.575 m)   Exam limited due to nature of phone visit. She seems slightly confused during the phone visit this morning, possibly drowsy, but after gathering herself would answer questions appropriately. When asked if her husband had any questions for me, she asks her husband "she's asking if she has any questions for you."   Assessment and Plan:   This is a pleasant 70 yo RH woman with a history of depression, anxiety, focal to bilateral tonic-clonic epilepsy, etiology unclear. MRI brain no acute changes. She was in status epilepticus in June 2019 with electrographic seizures arising from the right anterior frontal and paracentral region, then right-sided PLEDs. There were also occasional left posterior temporal/parietal epileptiform discharges seen. She denies any seizures since June 2019 on Levetiracetam 500mg  qhs, Phenobarbital 64.8mg  BID, and Lyrica 150mg  BID (also for migraine prophylaxis) for now. Migraines fairly well-controlled as well. She has prn Relpax. Continue on current medications for now, we may consider weaning off Levetiracetam again in the future. She is aware of [place] driving laws to stop driving until 6 months seizure-free. She has prn Relpax for migraines which are currently well-controlled. Follow-up in 6 months, she knows to call for any changes.    Follow Up Instructions:   -I discussed the assessment and treatment plan with the patient. The  patient was provided an opportunity to ask questions and all were answered. The patient agreed with the plan and demonstrated an understanding of the instructions.   The patient was advised to call back or seek an in-person evaluation if the symptoms worsen or if the condition fails to improve as anticipated.    Total Time  spent in visit with the patient was:  8:29 minutes, of which 100% of the time was spent in counseling and/or coordinating care on the above.   Pt understands and agrees with the plan of care outlined.     Cameron Sprang, MD

## 2020-09-07 ENCOUNTER — Other Ambulatory Visit (HOSPITAL_COMMUNITY): Payer: Self-pay | Admitting: Psychiatry

## 2020-09-25 ENCOUNTER — Other Ambulatory Visit: Payer: Self-pay

## 2020-09-25 ENCOUNTER — Ambulatory Visit (INDEPENDENT_AMBULATORY_CARE_PROVIDER_SITE_OTHER): Payer: 59 | Admitting: Psychiatry

## 2020-09-25 DIAGNOSIS — F325 Major depressive disorder, single episode, in full remission: Secondary | ICD-10-CM | POA: Diagnosis not present

## 2020-09-25 MED ORDER — HYDROXYZINE PAMOATE 25 MG PO CAPS: ORAL_CAPSULE | ORAL | 2 refills | Status: AC

## 2020-09-25 MED ORDER — FETZIMA 40 MG PO CP24: 1.0000 | ORAL_CAPSULE | Freq: Every day | ORAL | 2 refills | Status: AC

## 2020-09-25 NOTE — Progress Notes (Signed)
Pain management  Psychiatric Initial Adult Assessment   Patient Identification: Samantha Atkins MRN:  [id number] Date of Evaluation:  09/25/2020 Referral Source: Dr  Michele Mcalpine Chief Complaint:  Chronic depression Visit Diagnosis adjustment disorder with depression   Today the patient is doing fairly well.  She claims that she is getting a little bit more depressed and anxious but in the close evaluation there is barely any different than her baseline.  The patient rarely ever has depression more than a few hours or a day or so.  Overall the patient is sleeping and eating well. Ms. things are getting better.  Her foot is better.  She is no longer wearing the boot.  She is on a walker.  She seems more engageable in her family.  The patient is in good spirits.  The patient's other issue however as she has some visual issues she has glaucoma and she is seeing an ophthalmologist in the next week.  The patient denies use of alcohol or drugs.  She has never been psychotic.  She is functioning very well.  She takes her medicines as prescribed.  She is calm and compliant her thoughts are clear and organized. Associated Signs/Symptoms: Depression Symptoms:  depressed mood, (Hypo) Manic Symptoms:   Anxiety Symptoms:   Psychotic Symptoms:   PTSD Symptoms:   Past Psychiatric History: Prozac, Zoloft now on Lexapro 1 psychiatric hospitalization 2 decades ago  Previous Psychotropic Medications: Lexapro 10 mg, Ativan 1 mg as needed  Substance Abuse History in the last 12 months:  No.  Consequences of Substance Abuse:   Past Medical History:  Past Medical History:  Diagnosis Date  . Anxiety   . Arthritis    hands  . Cancer (HCC)    HISTORY OF OVARIAN CANCER  . Depression   . Fracture of humerus, proximal, left, closed 08/07/2015  . Fracture, humerus closed 07-31-15 fell   left  . GERD (gastroesophageal reflux disease)   . Headache   . Insomnia   . Migraines   . Osteoporosis   . S/P right hip  fracture 1990   fractures X 2  . Seizures ([place])     Past Surgical History:  Procedure Laterality Date  . ABDOMINAL HYSTERECTOMY    . FEMUR FRACTURE SURGERY Left    fell, redo hip replacement  . JOINT REPLACEMENT Bilateral    hip  . ORIF DISTAL RADIUS FRACTURE Left 03-2015  . ORIF HUMERUS FRACTURE Left 08/07/2015   Procedure: LEFT OPEN REDUCTION INTERNAL FIXATION (ORIF) PROXIMAL HUMERUS FRACTURE;  Surgeon: Marchia Bond, MD;  Location: [facility];  Service: Orthopedics;  Laterality: Left;  . TONSILLECTOMY    . TOTAL HIP REVISION Left 02/14/2017   Procedure: LEFT ORIF PERIPROSTHETIC FEMORAL FRACTURE AND REVISION HIP ARTHROPLASTY;  Surgeon: Paralee Cancel, MD;  Location: [facility];  Service: Orthopedics;  Laterality: Left;    Family Psychiatric History:   Family History:  Family History  Problem Relation Age of Onset  . Hip fracture Mother   . Heart Problems Father   . Alcohol abuse Father   . Drug abuse Father   . Anxiety disorder Father   . Parkinson's disease Brother   . Healthy Daughter     Social History:   Social History   Socioeconomic History  . Marital status: Married    Spouse name: Not on file  . Number of children: Not on file  . Years of education: Not on file  . Highest education level: Not on file  Occupational History  . Not on file  Tobacco Use  . Smoking status: Never Smoker  . Smokeless tobacco: Never Used  Vaping Use  . Vaping Use: Never used  Substance and Sexual Activity  . Alcohol use: No  . Drug use: No  . Sexual activity: Not Currently  Other Topics Concern  . Not on file  Social History Narrative   Right handed      Associates Degree      Lives with husband in one story home       Social Determinants of Health   Financial Resource Strain: Not on file  Food Insecurity: Not on file  Transportation Needs: Not on file  Physical Activity: Not on file  Stress: Not on file  Social Connections: Not on file    Additional  Social History:   Allergies:   Allergies  Allergen Reactions  . Dust Mite Extract Cough    Metabolic Disorder Labs: No results found for: HGBA1C, MPG No results found for: PROLACTIN No results found for: CHOL, TRIG, HDL, CHOLHDL, VLDL, LDLCALC   Current Medications: Current Outpatient Medications  Medication Sig Dispense Refill  . calcium citrate-vitamin D (CITRACAL+D) 315-200 MG-UNIT tablet Take 1 tablet by mouth 2 (two) times daily.    . cholecalciferol (VITAMIN D) 1000 units tablet Take 1,000 Units by mouth daily.    Marland Kitchen eletriptan (RELPAX) 20 MG tablet May repeat in 2 hours if headache persists or recurs. 10 tablet 11  . feeding supplement (BOOST HIGH PROTEIN) LIQD Take 1 Container by mouth daily.    . ferrous sulfate 325 (65 FE) MG EC tablet Take 1 tablet (325 mg total) by mouth every other day. (Patient taking differently: Take 325 mg by mouth daily with breakfast.) 60 tablet 0  . FETZIMA 40 MG CP24 Take 1 capsule by mouth daily. 90 capsule 2  . hydrOXYzine (VISTARIL) 25 MG capsule 2  qhs 180 capsule 2  . levETIRAcetam (KEPPRA) 500 MG tablet Take 1 tablet at bedtime 90 tablet 3  . LORazepam (ATIVAN) 1 MG tablet Take 1 tablet (1 mg total) by mouth every 4 (four) hours as needed for anxiety. 30 tablet 0  . Multiple Vitamin (MULTIVITAMIN WITH MINERALS) TABS tablet Take 1 tablet by mouth daily.    Marland Kitchen omeprazole (PRILOSEC) 20 MG capsule Take 20 mg by mouth daily.    Marland Kitchen PHENobarbital (LUMINAL) 64.8 MG tablet Take 1 tablet (64.8 mg total) by mouth 2 (two) times daily. 180 tablet 3  . potassium chloride SA (K-DUR,KLOR-CON) 20 MEQ tablet Take 20 mEq by mouth daily.    . pregabalin (LYRICA) 150 MG capsule Take 1 capsule (150 mg total) by mouth 2 (two) times daily. 180 capsule 3  . psyllium (HYDROCIL/METAMUCIL) 95 % PACK Take 1 packet by mouth daily.    Marland Kitchen senna-docusate (SENOKOT-S) 8.6-50 MG tablet Take 1 tablet by mouth at bedtime. 30 tablet 0   No current facility-administered medications  for this visit.    Neurologic: Headache: No Seizure: Yes Paresthesias:No  Musculoskeletal: Strength & Muscle Tone: decreased Gait & Station: unable to stand Patient leans: N/A  Psychiatric Specialty Exam: ROS  There were no vitals taken for this visit.There is no height or weight on file to calculate BMI.  General Appearance: Fairly Groomed  Eye Contact:    Speech:  Clear and Coherent  Volume:  Normal  Mood:  Anxious  Affect:  Congruent  Thought Process:  Goal Directed  Orientation:  Full (Time, Place, and Person)  Thought  Content:  Logical  Suicidal Thoughts:  No  Homicidal Thoughts:  No  Memory:  NA  Judgement:  Good  Insight:  Good  Psychomotor Activity:  Normal  Concentration:    Recall:  Platea of Knowledge:Good  Language: Good  Akathisia:  No  Handed:  Right  AIMS (if indicated):    Assets:  Desire for Improvement  ADL's:  Intact  Cognition: WNL  Sleep:      Treatment Plan Summary: 2/18/202211:28 AM   This patient's first problem is that of major clinical depression.  At this time it is in remission.  She takes Fetzima 40 mg and does very well.  Her second problem is insomnia.  She takes some hydroxyzine at night for sleep and it works very well.  The patient has not oversedated.  She has had no falls.  She is actually functioning fairly well.  She will return to see me in 3 months.

## 2020-09-28 ENCOUNTER — Other Ambulatory Visit: Payer: Self-pay | Admitting: Neurology

## 2020-12-15 ENCOUNTER — Telehealth: Payer: Self-pay | Admitting: Neurology

## 2020-12-15 DIAGNOSIS — G40001 Localization-related (focal) (partial) idiopathic epilepsy and epileptic syndromes with seizures of localized onset, not intractable, with status epilepticus: Secondary | ICD-10-CM

## 2020-12-15 NOTE — Telephone Encounter (Signed)
Patient called in wanting to let Dr. Delice Lesch know she has been having seizures about the last 6 weeks. She will have them "over and over". Multiple back to back almost every day.

## 2020-12-16 NOTE — Telephone Encounter (Signed)
Notified pt medication instructions by Dr. Carles Collet.

## 2020-12-16 NOTE — Telephone Encounter (Signed)
Pt c/o: seizure Missed medications?  No. Sleep deprived?  Yes.   sleeps only 3 or 4 hours a night Alcohol intake?  No.  Any increase in stress? Always stressed HX of anxiety  Back to their usual baseline self?  Yes.  . If no, advise go to ER Current medications prescribed by Dr. Delice Lesch:  Keppra 500mg  Take 1 tablet at bedtime Phenobarbital Take 1 tablet (64.8 mg total) by mouth 2 (two) times daily lyrica TAKE 1 CAPSULE BY MOUTH TWICE A DAY  Was shocked that they have been starting up, been daily. Unsure of any trigger. Husband stated that she had big seizures. One she had a fall laying on floor face down. Another on laying on her back in the floor. She only knows what her husband has told her, with the falls each time husband has picked her up and put her in the bed, no injuries noted.   Scheduled for cataract surgery for Friday. Pt CX surgery

## 2020-12-16 NOTE — Telephone Encounter (Signed)
Have her increase her Keppra to 500 mg bid for now and Dr. Delice Lesch can advise if anything further.

## 2020-12-20 NOTE — Telephone Encounter (Signed)
Pls let her know I'd like to do an EEG, pls order 1-hour EEG and have her schedule, thanks

## 2020-12-21 NOTE — Telephone Encounter (Signed)
Pt called and informed that Dr Delice Lesch would like to order an EEG and that someone from the front office would be calling her to get it scheduled

## 2020-12-21 NOTE — Telephone Encounter (Signed)
Pt is sch for the 1 hour EEG on 12-23-20

## 2020-12-21 NOTE — Addendum Note (Signed)
Addended by: Jake Seats on: 12/21/2020 09:04 AM   Modules accepted: Orders

## 2020-12-23 ENCOUNTER — Ambulatory Visit: Payer: Managed Care, Other (non HMO) | Admitting: Neurology

## 2020-12-23 ENCOUNTER — Other Ambulatory Visit: Payer: Self-pay

## 2020-12-23 ENCOUNTER — Other Ambulatory Visit (INDEPENDENT_AMBULATORY_CARE_PROVIDER_SITE_OTHER): Payer: Managed Care, Other (non HMO)

## 2020-12-23 DIAGNOSIS — G40001 Localization-related (focal) (partial) idiopathic epilepsy and epileptic syndromes with seizures of localized onset, not intractable, with status epilepticus: Secondary | ICD-10-CM

## 2020-12-23 DIAGNOSIS — R4182 Altered mental status, unspecified: Secondary | ICD-10-CM

## 2020-12-23 LAB — COMPREHENSIVE METABOLIC PANEL
ALT: 19 U/L (ref 0–35)
AST: 20 U/L (ref 0–37)
Albumin: 4.7 g/dL (ref 3.5–5.2)
Alkaline Phosphatase: 142 U/L — ABNORMAL HIGH (ref 39–117)
BUN: 11 mg/dL (ref 6–23)
CO2: 25 mEq/L (ref 19–32)
Calcium: 9.7 mg/dL (ref 8.4–10.5)
Chloride: 95 mEq/L — ABNORMAL LOW (ref 96–112)
Creatinine, Ser: 0.6 mg/dL (ref 0.40–1.20)
GFR: 91.61 mL/min (ref >60.00)
Glucose, Bld: 91 mg/dL (ref 70–99)
Potassium: 3.8 mEq/L (ref 3.5–5.1)
Sodium: 138 mEq/L (ref 135–145)
Total Bilirubin: 0.5 mg/dL (ref 0.2–1.2)
Total Protein: 8.1 g/dL (ref 6.0–8.3)

## 2020-12-23 LAB — CBC
HCT: 40.3 % (ref 36.0–46.0)
Hemoglobin: 14 g/dL (ref 12.0–15.0)
MCHC: 34.7 g/dL (ref 30.0–36.0)
MCV: 100.3 fl — ABNORMAL HIGH (ref 78.0–100.0)
Platelets: 543 10*3/uL — ABNORMAL HIGH (ref 150.0–400.0)
RBC: 4.02 Mil/uL (ref 3.87–5.11)
RDW: 13.2 % (ref 11.5–15.5)
WBC: 5.9 10*3/uL (ref 4.0–10.5)

## 2020-12-23 LAB — AMMONIA: Ammonia: 33 umol/L (ref 11–35)

## 2020-12-24 LAB — URINALYSIS
Bilirubin Urine: NEGATIVE
Hgb urine dipstick: NEGATIVE
Ketones, ur: >=80 — AB
Leukocytes,Ua: NEGATIVE
Nitrite: NEGATIVE
Specific Gravity, Urine: 1.02 (ref 1.000–1.030)
Total Protein, Urine: NEGATIVE
Urine Glucose: NEGATIVE
Urobilinogen, UA: 0.2 (ref 0.0–1.0)
pH: 6 (ref 5.0–8.0)

## 2020-12-25 ENCOUNTER — Encounter: Payer: Self-pay | Admitting: Neurology

## 2020-12-25 ENCOUNTER — Other Ambulatory Visit: Payer: Self-pay

## 2020-12-25 ENCOUNTER — Ambulatory Visit: Payer: Managed Care, Other (non HMO) | Admitting: Neurology

## 2020-12-25 VITALS — BP 185/107 | HR 62 | Ht 62.0 in | Wt 87.8 lb

## 2020-12-25 DIAGNOSIS — G40001 Localization-related (focal) (partial) idiopathic epilepsy and epileptic syndromes with seizures of localized onset, not intractable, with status epilepticus: Secondary | ICD-10-CM

## 2020-12-25 LAB — PHENOBARBITAL LEVEL: Phenobarbital, Serum: 12.6 mg/L — ABNORMAL LOW (ref 15.0–40.0)

## 2020-12-25 MED ORDER — LEVETIRACETAM 500 MG PO TABS: ORAL_TABLET | ORAL | 3 refills | Status: AC

## 2020-12-25 NOTE — Patient Instructions (Signed)
1. Increase Keppra 500mg : take 1 tablet twice a day  2. Stop the Tramadol  3. Continue all your medications  4. Schedule MRI brain with and without contrast  5. Schedule 48-hour EEG  6. Follow-up after tests, call for any changes   Seizure Precautions: 1. If medication has been prescribed for you to prevent seizures, take it exactly as directed.  Do not stop taking the medicine without talking to your doctor first, even if you have not had a seizure in a long time.   2. Avoid activities in which a seizure would cause danger to yourself or to others.  Don't operate dangerous machinery, swim alone, or climb in high or dangerous places, such as on ladders, roofs, or girders.  Do not drive unless your doctor says you may.  3. If you have any warning that you may have a seizure, lay down in a safe place where you can't hurt yourself.    4.  No driving for 6 months from last seizure, as per [facility].   Please refer to the following link on the [facility] website for more information: http://[email or web address]/[email or web address]/drivingu.cfm   5.  Maintain good sleep hygiene. Avoid alcohol.  6.  Contact your doctor if you have any problems that may be related to the medicine you are taking.  7.  Call 911 and bring the patient back to the ED if:        A.  The seizure lasts longer than 5 minutes.       B.  The patient doesn't awaken shortly after the seizure  C.  The patient has new problems such as difficulty seeing, speaking or moving  D.  The patient was injured during the seizure  E.  The patient has a temperature over 102 F (39C)  F.  The patient vomited and now is having trouble breathing

## 2020-12-25 NOTE — Progress Notes (Signed)
NEUROLOGY FOLLOW UP OFFICE NOTE  Samantha Atkins [id number] [birth year]  HISTORY OF PRESENT ILLNESS: I had the pleasure of seeing Samantha Atkins in follow-up in the neurology clinic on 12/25/2020. She is accompanied by her husband who helps supplement the history today. The patient was last evaluated by phone 4 months ago for seizures.  and is accompanied by her husband who helps supplement the history today.  Records and images were personally reviewed where available.  Since her last visit, she called our office on 12/15/20 to report recurrence of seizures after being seizure-free since 2019. She reported having multiple back to back almost daily, "over and over" in the past 6 weeks. Her husband reported big seizures where she would fall. One of them she fell face down. Her husband picks her up and puts her to bed. She reported poor sleep (3-4 hours) and always being anxious. She had been on Levetiracetam 500mg  qhs, Phenobarbital 64.8mg  BID, and Lyrica 150mg  BID. She was instructed to increase Levetiracetam to 500mg  BID but has not done it yet. Her EEG done 2 days ago did not show any epileptiform discharges or electrographic seizures, there was high voltage activity with mild background slowing. Bloodwork showed elevated platelet count 543, MCV 100.3, alkaline phosphatase 143, CMP, ammonia. UA no infection, there was note of elevated ketones (normal glucose). Phenobarbital level is pending.  She and her husband deny any convulsions since 2019. Her husband however reports intermittent episodes over the past year where "she is losing motor control." She seems very weak, barely able to stand, shivering/shaking. She would be conscious and verbal but there is a distinct behavioral change. She would only make 4-word sentences and would be unable to name things, saying "bring me that thing" repeatedly. She would be argumentative and more obstinate than usual. She would not follow instructions, with a weird combination  of being obsessed with something and unable to focus. These would last for several hours. A few months ago she had a Monday doctor appointment, then on Saturday started getting ready for it. Her husband reminded her it was not until Monday, she said okay, then 5 minutes later started getting dressed again. Initially they were occurring once a month, but more frequently the past couple of months. Around 3 weekends ago she fell 3 times in 24 hours. The first fall occurred when she got out of bed, it looked like she rolled off the bed. Later on he heard her fall while she was putting together presents, and found her with one foot in a cardboard box. A few hours later he heard a massive thump and found her laying on her back with her walker face down, her eyeglasses were 8 feet away. She was confused but conscious, unaware of what was going on. She sustained 2 black eyes. No nocturnal events. He states the staring spells in the past are not occurring, but there is a little bit of that where there is a different personality and she does not remember. They note she had a fall 7 weeks ago and broke the left ulna, taking Tramadol on and off since then. He also reports she is not very good with taking medications regularly, at least once a week she forgets them. She rarely takes the Ativan (once a month).    History on Initial Assessment 03/02/2018: This is a pleasant 70 year old right-handed woman with a history of osteoporosis, depression, GERD, presenting for hospital follow-up after hospital admission for status epilepticus. Records  were reviewed. She was admitted for a fall in July 2018 and was found to have a left hip fracture. She had no recollection of the fall and was getting ready for surgery when she had an episode of staring and unresponsiveness with some hand wringing movements. On further questioning, she had been having these episodes for several years which her husband had attributed to her underlying  psychiatric condition. She has severe depression and anxiety. Her husband reported multiple falls, they were in the bedroom one time and he noted an awkward pause in conversation then she "fell like a tree." No convulsive activity noted. She had an EEG which showed mild to moderate diffuse slowing, no epileptiform discharges seen. CT head showed diffuse cerebral and cerebellar atrophy, chronic microvascular disease. She was discharged on Keppra 500mg  BID. She was brought back to the hospital last 01/29/18 for status epilepticus. Her husband found her partway off the bed unresponsive, foaming at the mouth. She was given IV Versed, then had another seizure and was given IV Ativan. ER noted eyelid twitching and right gaze deviation, as well as right arm and leg twitching. She underwent 5 days of inpatient video EEG monitoring, initial EEG showed focal slowing on the right posterior region with runs of brief rhythmic activity in the left frontal region. She had worsening changes on EEG, with electrographic seizures arising from the right anterior frontal and paracentral region. Several seizure medications were started, with complete resolution of right PLEDs on addition of phenobarbital. There was also occasional left posterior tempora/parietal epileptiform discharges seen. I personally reviewed MRI brain without contrast which did not show any acute changes, there was moderate brain atrophy, mild chronic microvascular disease. She had a lumbar puncture with slightly elevated protein of 78, negative HSV. Over the course of her hospital stay, Keppra and Dilantin were discontinued, she was discharged home on Phenobarbital 60mg  BID and Lyrica 150mg  every 8 hours. Her husband denies any further seizures since hospital discharge 2 weeks ago. Her husband fills her pillbox but she takes medications herself, occasionally missing the noon dose of Lyrica. She denies any olfactory/gustatory hallucinations, deja vu, rising  epigastric sensation, focal numbness/tingling/weakness, myoclonic jerks. She has tingling in both feet and reports a history of leg length discrepancy. She has a history of migraines with severe migraines 2-3 times a week, lasting 3-5 days. They used to be over the frontal region but lately have been in the back of her head, with nausea, vomiting, photo/phonophobia. She denies any migraine aura or visual obscurations. No family history of migraines. She has a lot of neck pain and shoulder pain. No dizziness, diplopia, dysarthria/dysphagia, bowel/bladder dysfunction. She initially had some urinary incontinence which is better. She has been using her walker since a fall in 2013 where she broke her right leg. She has been dealing with a lot of stress and depression, worsening her headaches. She takes Fioricet every 6 hours as needed and states she does not take it daily. She reports severe depression and insomnia, she reports Sertraline was stopped in the hospital and asks about resuming medication. Melatonin did not help with sleep.   Epilepsy Risk Factors:  She had a normal birth and early development.  There is no history of febrile convulsions, CNS infections such as meningitis/encephalitis, significant traumatic brain injury, neurosurgical procedures, or family history of seizures.    PAST MEDICAL HISTORY: Past Medical History:  Diagnosis Date  . Anxiety   . Arthritis    hands  . Cancer ([place])  HISTORY OF OVARIAN CANCER  . Depression   . Fracture of humerus, proximal, left, closed 08/07/2015  . Fracture, humerus closed 07-31-15 fell   left  . GERD (gastroesophageal reflux disease)   . Headache   . Insomnia   . Migraines   . Osteoporosis   . S/P right hip fracture 1990   fractures X 2  . Seizures ([place])     MEDICATIONS: Current Outpatient Medications on File Prior to Visit  Medication Sig Dispense Refill  . pregabalin (LYRICA) 150 MG capsule TAKE 1 CAPSULE BY MOUTH TWICE A DAY 180  capsule 5  . calcium citrate-vitamin D (CITRACAL+D) 315-200 MG-UNIT tablet Take 1 tablet by mouth 2 (two) times daily.    . cholecalciferol (VITAMIN D) 1000 units tablet Take 1,000 Units by mouth daily.    Marland Kitchen eletriptan (RELPAX) 20 MG tablet May repeat in 2 hours if headache persists or recurs. 10 tablet 11  . feeding supplement (BOOST HIGH PROTEIN) LIQD Take 1 Container by mouth daily.    . ferrous sulfate 325 (65 FE) MG EC tablet Take 1 tablet (325 mg total) by mouth every other day. (Patient taking differently: Take 325 mg by mouth daily with breakfast.) 60 tablet 0  . FETZIMA 40 MG CP24 Take 1 capsule by mouth daily. 90 capsule 2  . hydrOXYzine (VISTARIL) 25 MG capsule 2  qhs 180 capsule 2  . levETIRAcetam (KEPPRA) 500 MG tablet Take 1 tablet at bedtime 90 tablet 3  . LORazepam (ATIVAN) 1 MG tablet Take 1 tablet (1 mg total) by mouth every 4 (four) hours as needed for anxiety. 30 tablet 0  . Multiple Vitamin (MULTIVITAMIN WITH MINERALS) TABS tablet Take 1 tablet by mouth daily.    Marland Kitchen omeprazole (PRILOSEC) 20 MG capsule Take 20 mg by mouth daily.    Marland Kitchen PHENobarbital (LUMINAL) 64.8 MG tablet Take 1 tablet (64.8 mg total) by mouth 2 (two) times daily. 180 tablet 3  . potassium chloride SA (K-DUR,KLOR-CON) 20 MEQ tablet Take 20 mEq by mouth daily.    . psyllium (HYDROCIL/METAMUCIL) 95 % PACK Take 1 packet by mouth daily.    Marland Kitchen senna-docusate (SENOKOT-S) 8.6-50 MG tablet Take 1 tablet by mouth at bedtime. 30 tablet 0   No current facility-administered medications on file prior to visit.    ALLERGIES: Allergies  Allergen Reactions  . Dust Mite Extract Cough    FAMILY HISTORY: Family History  Problem Relation Age of Onset  . Hip fracture Mother   . Heart Problems Father   . Alcohol abuse Father   . Drug abuse Father   . Anxiety disorder Father   . Parkinson's disease Brother   . Healthy Daughter     SOCIAL HISTORY: Social History   Socioeconomic History  . Marital status: Married     Spouse name: Not on file  . Number of children: Not on file  . Years of education: Not on file  . Highest education level: Not on file  Occupational History  . Not on file  Tobacco Use  . Smoking status: Never Smoker  . Smokeless tobacco: Never Used  Vaping Use  . Vaping Use: Never used  Substance and Sexual Activity  . Alcohol use: No  . Drug use: No  . Sexual activity: Not Currently  Other Topics Concern  . Not on file  Social History Narrative   Right handed      Associates Degree      Lives with husband in one story home  Social Determinants of Health   Financial Resource Strain: Not on file  Food Insecurity: Not on file  Transportation Needs: Not on file  Physical Activity: Not on file  Stress: Not on file  Social Connections: Not on file  Intimate Partner Violence: Not on file     PHYSICAL EXAM: Vitals:   12/25/20 0842  BP: (!) 185/107  Pulse: 62  SpO2: 99%   General: No acute distress Head:  Normocephalic/atraumatic Skin/Extremities: No rash, no edema, left arm in brace Neurological Exam: alert and awake. No aphasia or dysarthria. Fund of knowledge is reduced.  Recent and remote memory are impaired.  Attention and concentration are normal.   Cranial nerves: Pupils equal, round. Extraocular movements intact with no nystagmus. Visual fields full.  No facial asymmetry.  Motor: Bulk and tone normal, muscle strength 5/5 throughout with no pronator drift.   Finger to nose testing intact.  Gait slow and cautious with walker.    IMPRESSION: This is a pleasant 70 yo RH woman with a history of depression, anxiety, focal to bilateral tonic-clonic epilepsy, etiology unclear. MRI brain no acute changes. She was in status epilepticus in June 2019 with electrographic seizures arising from the right anterior frontal and paracentral region, then right-sided PLEDs. There were also occasional left posterior temporal/parietal epileptiform discharges seen. No convulsions  since June 2019, however her husband reports that over the past month there have been episodes of confusion, weakness, change in personality. These have increased recently with an increase in falls. Her routine EEG did not show any epileptiform discharges but showed high voltage diffuse slowing. A 48-hour EEG will be done to further evaluate symptoms. Increase Levetiracetam to 500mg  BID, continue Phenobarbital 64.8mg  BID and Lyrica 150mg  BID (also for migraine prophylaxis). She will be scheduled for MRI brain with and without contrast to assess for underlying structural abnormality. We discussed how Tramadol can lower seizure threshold, she was advised to stop Tramadol. We also discussed avoidance of seizure triggers, including missing medications and sleep deprivation. Follow-up after tests, call for any changes.   Thank you for allowing me to participate in her care.  Please do not hesitate to call for any questions or concerns.   Ellouise Newer, M.D.   CC: Dr. Sabra Heck

## 2020-12-30 ENCOUNTER — Telehealth (HOSPITAL_COMMUNITY): Payer: 59 | Admitting: Psychiatry

## 2021-01-08 ENCOUNTER — Other Ambulatory Visit: Payer: Self-pay

## 2021-01-08 ENCOUNTER — Telehealth (HOSPITAL_COMMUNITY): Payer: 59 | Admitting: Psychiatry

## 2021-01-11 ENCOUNTER — Other Ambulatory Visit: Payer: Self-pay

## 2021-01-11 ENCOUNTER — Ambulatory Visit: Payer: Managed Care, Other (non HMO) | Admitting: Neurology

## 2021-01-11 DIAGNOSIS — G40001 Localization-related (focal) (partial) idiopathic epilepsy and epileptic syndromes with seizures of localized onset, not intractable, with status epilepticus: Secondary | ICD-10-CM

## 2021-01-12 ENCOUNTER — Observation Stay (HOSPITAL_COMMUNITY): Payer: Managed Care, Other (non HMO)

## 2021-01-12 ENCOUNTER — Emergency Department (HOSPITAL_COMMUNITY): Payer: Managed Care, Other (non HMO)

## 2021-01-12 ENCOUNTER — Telehealth: Payer: Self-pay | Admitting: Neurology

## 2021-01-12 ENCOUNTER — Inpatient Hospital Stay (HOSPITAL_COMMUNITY): Payer: Managed Care, Other (non HMO)

## 2021-01-12 ENCOUNTER — Inpatient Hospital Stay (HOSPITAL_COMMUNITY)
Admission: EM | Admit: 2021-01-12 | Discharge: 2021-02-05 | DRG: 100 | Disposition: E | Payer: Managed Care, Other (non HMO) | Attending: Critical Care Medicine | Admitting: Critical Care Medicine

## 2021-01-12 DIAGNOSIS — R29710 NIHSS score 10: Secondary | ICD-10-CM | POA: Diagnosis present

## 2021-01-12 DIAGNOSIS — E875 Hyperkalemia: Secondary | ICD-10-CM | POA: Diagnosis not present

## 2021-01-12 DIAGNOSIS — Z4659 Encounter for fitting and adjustment of other gastrointestinal appliance and device: Secondary | ICD-10-CM

## 2021-01-12 DIAGNOSIS — G9341 Metabolic encephalopathy: Secondary | ICD-10-CM | POA: Diagnosis not present

## 2021-01-12 DIAGNOSIS — D638 Anemia in other chronic diseases classified elsewhere: Secondary | ICD-10-CM | POA: Diagnosis present

## 2021-01-12 DIAGNOSIS — I5042 Chronic combined systolic (congestive) and diastolic (congestive) heart failure: Secondary | ICD-10-CM | POA: Diagnosis present

## 2021-01-12 DIAGNOSIS — L8990 Pressure ulcer of unspecified site, unspecified stage: Secondary | ICD-10-CM | POA: Insufficient documentation

## 2021-01-12 DIAGNOSIS — T380X5A Adverse effect of glucocorticoids and synthetic analogues, initial encounter: Secondary | ICD-10-CM | POA: Diagnosis not present

## 2021-01-12 DIAGNOSIS — T426X6A Underdosing of other antiepileptic and sedative-hypnotic drugs, initial encounter: Secondary | ICD-10-CM | POA: Diagnosis present

## 2021-01-12 DIAGNOSIS — Z8543 Personal history of malignant neoplasm of ovary: Secondary | ICD-10-CM

## 2021-01-12 DIAGNOSIS — Z91128 Patient's intentional underdosing of medication regimen for other reason: Secondary | ICD-10-CM

## 2021-01-12 DIAGNOSIS — E43 Unspecified severe protein-calorie malnutrition: Secondary | ICD-10-CM | POA: Diagnosis present

## 2021-01-12 DIAGNOSIS — F32A Depression, unspecified: Secondary | ICD-10-CM | POA: Diagnosis present

## 2021-01-12 DIAGNOSIS — G0481 Other encephalitis and encephalomyelitis: Secondary | ICD-10-CM | POA: Diagnosis present

## 2021-01-12 DIAGNOSIS — Z9911 Dependence on respirator [ventilator] status: Secondary | ICD-10-CM | POA: Diagnosis not present

## 2021-01-12 DIAGNOSIS — R112 Nausea with vomiting, unspecified: Secondary | ICD-10-CM | POA: Diagnosis present

## 2021-01-12 DIAGNOSIS — E876 Hypokalemia: Secondary | ICD-10-CM | POA: Diagnosis present

## 2021-01-12 DIAGNOSIS — Z681 Body mass index (BMI) 19 or less, adult: Secondary | ICD-10-CM | POA: Diagnosis not present

## 2021-01-12 DIAGNOSIS — J9602 Acute respiratory failure with hypercapnia: Secondary | ICD-10-CM | POA: Diagnosis present

## 2021-01-12 DIAGNOSIS — M7989 Other specified soft tissue disorders: Secondary | ICD-10-CM | POA: Diagnosis not present

## 2021-01-12 DIAGNOSIS — I5040 Unspecified combined systolic (congestive) and diastolic (congestive) heart failure: Secondary | ICD-10-CM | POA: Diagnosis present

## 2021-01-12 DIAGNOSIS — Z66 Do not resuscitate: Secondary | ICD-10-CM | POA: Diagnosis not present

## 2021-01-12 DIAGNOSIS — I9589 Other hypotension: Secondary | ICD-10-CM | POA: Diagnosis present

## 2021-01-12 DIAGNOSIS — Z9114 Patient's other noncompliance with medication regimen: Secondary | ICD-10-CM

## 2021-01-12 DIAGNOSIS — Z820 Family history of epilepsy and other diseases of the nervous system: Secondary | ICD-10-CM | POA: Diagnosis not present

## 2021-01-12 DIAGNOSIS — R64 Cachexia: Secondary | ICD-10-CM | POA: Diagnosis present

## 2021-01-12 DIAGNOSIS — D72828 Other elevated white blood cell count: Secondary | ICD-10-CM | POA: Diagnosis not present

## 2021-01-12 DIAGNOSIS — E559 Vitamin D deficiency, unspecified: Secondary | ICD-10-CM | POA: Diagnosis present

## 2021-01-12 DIAGNOSIS — Z20822 Contact with and (suspected) exposure to covid-19: Secondary | ICD-10-CM | POA: Diagnosis present

## 2021-01-12 DIAGNOSIS — D62 Acute posthemorrhagic anemia: Secondary | ICD-10-CM | POA: Diagnosis present

## 2021-01-12 DIAGNOSIS — Z96643 Presence of artificial hip joint, bilateral: Secondary | ICD-10-CM | POA: Diagnosis present

## 2021-01-12 DIAGNOSIS — R5381 Other malaise: Secondary | ICD-10-CM | POA: Diagnosis not present

## 2021-01-12 DIAGNOSIS — R414 Neurologic neglect syndrome: Secondary | ICD-10-CM | POA: Diagnosis present

## 2021-01-12 DIAGNOSIS — E871 Hypo-osmolality and hyponatremia: Secondary | ICD-10-CM | POA: Diagnosis not present

## 2021-01-12 DIAGNOSIS — G40909 Epilepsy, unspecified, not intractable, without status epilepticus: Secondary | ICD-10-CM | POA: Diagnosis not present

## 2021-01-12 DIAGNOSIS — G8384 Todd's paralysis (postepileptic): Secondary | ICD-10-CM | POA: Diagnosis present

## 2021-01-12 DIAGNOSIS — L538 Other specified erythematous conditions: Secondary | ICD-10-CM | POA: Diagnosis not present

## 2021-01-12 DIAGNOSIS — D7589 Other specified diseases of blood and blood-forming organs: Secondary | ICD-10-CM | POA: Diagnosis not present

## 2021-01-12 DIAGNOSIS — G40101 Localization-related (focal) (partial) symptomatic epilepsy and epileptic syndromes with simple partial seizures, not intractable, with status epilepticus: Principal | ICD-10-CM | POA: Diagnosis present

## 2021-01-12 DIAGNOSIS — R29810 Facial weakness: Secondary | ICD-10-CM | POA: Diagnosis present

## 2021-01-12 DIAGNOSIS — E162 Hypoglycemia, unspecified: Secondary | ICD-10-CM | POA: Diagnosis not present

## 2021-01-12 DIAGNOSIS — Z79899 Other long term (current) drug therapy: Secondary | ICD-10-CM

## 2021-01-12 DIAGNOSIS — R569 Unspecified convulsions: Secondary | ICD-10-CM | POA: Diagnosis present

## 2021-01-12 DIAGNOSIS — Z452 Encounter for adjustment and management of vascular access device: Secondary | ICD-10-CM

## 2021-01-12 DIAGNOSIS — F419 Anxiety disorder, unspecified: Secondary | ICD-10-CM | POA: Diagnosis present

## 2021-01-12 DIAGNOSIS — C801 Malignant (primary) neoplasm, unspecified: Secondary | ICD-10-CM

## 2021-01-12 DIAGNOSIS — J9601 Acute respiratory failure with hypoxia: Secondary | ICD-10-CM | POA: Diagnosis present

## 2021-01-12 DIAGNOSIS — K922 Gastrointestinal hemorrhage, unspecified: Secondary | ICD-10-CM | POA: Diagnosis present

## 2021-01-12 DIAGNOSIS — E872 Acidosis: Secondary | ICD-10-CM | POA: Diagnosis present

## 2021-01-12 DIAGNOSIS — G40901 Epilepsy, unspecified, not intractable, with status epilepticus: Secondary | ICD-10-CM | POA: Diagnosis not present

## 2021-01-12 DIAGNOSIS — Z90710 Acquired absence of both cervix and uterus: Secondary | ICD-10-CM

## 2021-01-12 DIAGNOSIS — M810 Age-related osteoporosis without current pathological fracture: Secondary | ICD-10-CM | POA: Diagnosis present

## 2021-01-12 DIAGNOSIS — R451 Restlessness and agitation: Secondary | ICD-10-CM | POA: Diagnosis not present

## 2021-01-12 DIAGNOSIS — R680 Hypothermia, not associated with low environmental temperature: Secondary | ICD-10-CM | POA: Diagnosis not present

## 2021-01-12 DIAGNOSIS — J9600 Acute respiratory failure, unspecified whether with hypoxia or hypercapnia: Secondary | ICD-10-CM

## 2021-01-12 DIAGNOSIS — K219 Gastro-esophageal reflux disease without esophagitis: Secondary | ICD-10-CM | POA: Diagnosis present

## 2021-01-12 DIAGNOSIS — Z01818 Encounter for other preprocedural examination: Secondary | ICD-10-CM

## 2021-01-12 LAB — PROTIME-INR
INR: 1 (ref 0.8–1.2)
Prothrombin Time: 13.2 seconds (ref 11.4–15.2)

## 2021-01-12 LAB — I-STAT CHEM 8, ED
BUN: 11 mg/dL (ref 8–23)
Calcium, Ion: 0.91 mmol/L — ABNORMAL LOW (ref 1.15–1.40)
Chloride: 102 mmol/L (ref 98–111)
Creatinine, Ser: 0.5 mg/dL (ref 0.44–1.00)
Glucose, Bld: 204 mg/dL — ABNORMAL HIGH (ref 70–99)
HCT: 46 % (ref 36.0–46.0)
Hemoglobin: 15.6 g/dL — ABNORMAL HIGH (ref 12.0–15.0)
Potassium: 2.8 mmol/L — ABNORMAL LOW (ref 3.5–5.1)
Sodium: 136 mmol/L (ref 135–145)
TCO2: 15 mmol/L — ABNORMAL LOW (ref 22–32)

## 2021-01-12 LAB — COMPREHENSIVE METABOLIC PANEL
ALT: 30 U/L (ref 0–44)
AST: 33 U/L (ref 15–41)
Albumin: 3.8 g/dL (ref 3.5–5.0)
Alkaline Phosphatase: 114 U/L (ref 38–126)
Anion gap: 24 — ABNORMAL HIGH (ref 5–15)
BUN: 11 mg/dL (ref 8–23)
CO2: 14 mmol/L — ABNORMAL LOW (ref 22–32)
Calcium: 8.7 mg/dL — ABNORMAL LOW (ref 8.9–10.3)
Chloride: 99 mmol/L (ref 98–111)
Creatinine, Ser: 0.95 mg/dL (ref 0.44–1.00)
GFR, Estimated: >60 mL/min (ref >60)
Glucose, Bld: 206 mg/dL — ABNORMAL HIGH (ref 70–99)
Potassium: 2.9 mmol/L — ABNORMAL LOW (ref 3.5–5.1)
Sodium: 137 mmol/L (ref 135–145)
Total Bilirubin: 0.4 mg/dL (ref 0.3–1.2)
Total Protein: 7.3 g/dL (ref 6.5–8.1)

## 2021-01-12 LAB — CBC
HCT: 45.7 % (ref 36.0–46.0)
Hemoglobin: 15.2 g/dL — ABNORMAL HIGH (ref 12.0–15.0)
MCH: 34.8 pg — ABNORMAL HIGH (ref 26.0–34.0)
MCHC: 33.3 g/dL (ref 30.0–36.0)
MCV: 104.6 fL — ABNORMAL HIGH (ref 80.0–100.0)
Platelets: 433 10*3/uL — ABNORMAL HIGH (ref 150–400)
RBC: 4.37 MIL/uL (ref 3.87–5.11)
RDW: 12.4 % (ref 11.5–15.5)
WBC: 9.3 10*3/uL (ref 4.0–10.5)
nRBC: 0 % (ref 0.0–0.2)

## 2021-01-12 LAB — DIFFERENTIAL
Abs Immature Granulocytes: 0.05 10*3/uL (ref 0.00–0.07)
Basophils Absolute: 0 10*3/uL (ref 0.0–0.1)
Basophils Relative: 0 %
Eosinophils Absolute: 0 10*3/uL (ref 0.0–0.5)
Eosinophils Relative: 0 %
Immature Granulocytes: 1 %
Lymphocytes Relative: 8 %
Lymphs Abs: 0.8 10*3/uL (ref 0.7–4.0)
Monocytes Absolute: 0.4 10*3/uL (ref 0.1–1.0)
Monocytes Relative: 4 %
Neutro Abs: 8 10*3/uL — ABNORMAL HIGH (ref 1.7–7.7)
Neutrophils Relative %: 87 %

## 2021-01-12 LAB — RESP PANEL BY RT-PCR (FLU A&B, COVID) ARPGX2
Influenza A by PCR: NEGATIVE
Influenza B by PCR: NEGATIVE
SARS Coronavirus 2 by RT PCR: NEGATIVE

## 2021-01-12 LAB — POCT I-STAT 7, (LYTES, BLD GAS, ICA,H+H)
Acid-base deficit: 3 mmol/L — ABNORMAL HIGH (ref 0.0–2.0)
Bicarbonate: 21.4 mmol/L (ref 20.0–28.0)
Calcium, Ion: 1.14 mmol/L — ABNORMAL LOW (ref 1.15–1.40)
HCT: 32 % — ABNORMAL LOW (ref 36.0–46.0)
Hemoglobin: 10.9 g/dL — ABNORMAL LOW (ref 12.0–15.0)
O2 Saturation: 100 %
Patient temperature: 98
Potassium: 4.2 mmol/L (ref 3.5–5.1)
Sodium: 137 mmol/L (ref 135–145)
TCO2: 22 mmol/L (ref 22–32)
pCO2 arterial: 36 mmHg (ref 32.0–48.0)
pH, Arterial: 7.38 (ref 7.350–7.450)
pO2, Arterial: 250 mmHg — ABNORMAL HIGH (ref 83.0–108.0)

## 2021-01-12 LAB — TSH: TSH: 1.576 u[IU]/mL (ref 0.350–4.500)

## 2021-01-12 LAB — MRSA PCR SCREENING: MRSA by PCR: NEGATIVE

## 2021-01-12 LAB — CBG MONITORING, ED: Glucose-Capillary: 198 mg/dL — ABNORMAL HIGH (ref 70–99)

## 2021-01-12 LAB — PHENOBARBITAL LEVEL: Phenobarbital: 14.2 ug/mL — ABNORMAL LOW (ref 15.0–30.0)

## 2021-01-12 LAB — PROCALCITONIN: Procalcitonin: 3.37 ng/mL

## 2021-01-12 LAB — HIV ANTIBODY (ROUTINE TESTING W REFLEX): HIV Screen 4th Generation wRfx: NONREACTIVE

## 2021-01-12 LAB — APTT: aPTT: 24 seconds (ref 24–36)

## 2021-01-12 LAB — ETHANOL: Alcohol, Ethyl (B): <10 mg/dL (ref <10)

## 2021-01-12 LAB — PHOSPHORUS: Phosphorus: 1.9 mg/dL — ABNORMAL LOW (ref 2.5–4.6)

## 2021-01-12 LAB — MAGNESIUM: Magnesium: 2.1 mg/dL (ref 1.7–2.4)

## 2021-01-12 LAB — LACTIC ACID, PLASMA: Lactic Acid, Venous: 1.6 mmol/L (ref 0.5–1.9)

## 2021-01-12 LAB — PHENYTOIN LEVEL, TOTAL: Phenytoin Lvl: 14.5 ug/mL (ref 10.0–20.0)

## 2021-01-12 LAB — GLUCOSE, CAPILLARY: Glucose-Capillary: 97 mg/dL (ref 70–99)

## 2021-01-12 MED ORDER — PHENOBARBITAL SODIUM 65 MG/ML IJ SOLN
300.0000 mg | INTRAMUSCULAR | Status: AC
  Administered 2021-01-12: 300 mg via INTRAVENOUS
  Filled 2021-01-12: qty 4.6

## 2021-01-12 MED ORDER — CHLORHEXIDINE GLUCONATE CLOTH 2 % EX PADS: 6.0000 | MEDICATED_PAD | Freq: Every day | CUTANEOUS | Status: DC

## 2021-01-12 MED ORDER — SODIUM CHLORIDE 0.9 % IV SOLN
20.0000 mg/kg | Freq: Once | INTRAVENOUS | Status: AC
  Administered 2021-01-12: 778 mg via INTRAVENOUS
  Filled 2021-01-12: qty 15.56

## 2021-01-12 MED ORDER — PHENOBARBITAL 64.8 MG PO TABS: 64.8000 mg | ORAL_TABLET | Freq: Two times a day (BID) | ORAL | Status: DC

## 2021-01-12 MED ORDER — MIDAZOLAM HCL 2 MG/2ML IJ SOLN
INTRAMUSCULAR | Status: AC
  Filled 2021-01-12: qty 2

## 2021-01-12 MED ORDER — POLYETHYLENE GLYCOL 3350 17 G PO PACK
17.0000 g | PACK | Freq: Every day | ORAL | Status: DC
  Administered 2021-01-14 – 2021-01-24 (×9): 17 g
  Filled 2021-01-12 (×10): qty 1

## 2021-01-12 MED ORDER — PROPOFOL 1000 MG/100ML IV EMUL
0.0000 ug/kg/min | INTRAVENOUS | Status: DC
  Administered 2021-01-12: 10 ug/kg/min via INTRAVENOUS
  Filled 2021-01-12: qty 100

## 2021-01-12 MED ORDER — LEVETIRACETAM IN NACL 1000 MG/100ML IV SOLN
1000.0000 mg | Freq: Two times a day (BID) | INTRAVENOUS | Status: DC
  Administered 2021-01-12: 1000 mg via INTRAVENOUS
  Filled 2021-01-12: qty 100

## 2021-01-12 MED ORDER — LEVETIRACETAM 500 MG PO TABS: 500.0000 mg | ORAL_TABLET | Freq: Two times a day (BID) | ORAL | Status: DC

## 2021-01-12 MED ORDER — ORAL CARE MOUTH RINSE
15.0000 mL | OROMUCOSAL | Status: DC
  Administered 2021-01-13 – 2021-01-27 (×143): 15 mL via OROMUCOSAL

## 2021-01-12 MED ORDER — SODIUM CHLORIDE 0.9 % IV SOLN
75.0000 mL/h | INTRAVENOUS | Status: DC
  Administered 2021-01-12: 75 mL/h via INTRAVENOUS

## 2021-01-12 MED ORDER — ONDANSETRON HCL 4 MG/2ML IJ SOLN: 4.0000 mg | Freq: Four times a day (QID) | INTRAMUSCULAR | Status: DC | PRN

## 2021-01-12 MED ORDER — POLYETHYLENE GLYCOL 3350 17 G PO PACK: 17.0000 g | PACK | Freq: Every day | ORAL | Status: DC | PRN

## 2021-01-12 MED ORDER — SODIUM CHLORIDE 0.9 % IV SOLN
INTRAVENOUS | Status: DC | PRN
  Administered 2021-01-12: 1000 mL via INTRAVENOUS

## 2021-01-12 MED ORDER — DOCUSATE SODIUM 100 MG PO CAPS: 100.0000 mg | ORAL_CAPSULE | Freq: Two times a day (BID) | ORAL | Status: DC | PRN

## 2021-01-12 MED ORDER — PREGABALIN 75 MG PO CAPS
150.0000 mg | ORAL_CAPSULE | Freq: Two times a day (BID) | ORAL | Status: DC
  Administered 2021-01-14 – 2021-01-15 (×3): 150 mg
  Filled 2021-01-12 (×3): qty 2

## 2021-01-12 MED ORDER — ETOMIDATE 2 MG/ML IV SOLN
INTRAVENOUS | Status: AC
  Filled 2021-01-12: qty 20

## 2021-01-12 MED ORDER — CHLORHEXIDINE GLUCONATE 0.12% ORAL RINSE (MEDLINE KIT)
15.0000 mL | Freq: Two times a day (BID) | OROMUCOSAL | Status: DC
  Administered 2021-01-12 – 2021-01-26 (×29): 15 mL via OROMUCOSAL

## 2021-01-12 MED ORDER — PHENOBARBITAL SODIUM 65 MG/ML IJ SOLN
64.8000 mg | Freq: Two times a day (BID) | INTRAMUSCULAR | Status: DC
  Administered 2021-01-12: 64.8 mg via INTRAVENOUS

## 2021-01-12 MED ORDER — ROCURONIUM BROMIDE 10 MG/ML (PF) SYRINGE
1.0000 mg/kg | PREFILLED_SYRINGE | Freq: Once | INTRAVENOUS | Status: AC
  Administered 2021-01-12: 38.9 mg via INTRAVENOUS

## 2021-01-12 MED ORDER — PHENYTOIN 50 MG PO CHEW
150.0000 mg | CHEWABLE_TABLET | Freq: Two times a day (BID) | ORAL | Status: DC
  Filled 2021-01-12: qty 3

## 2021-01-12 MED ORDER — SODIUM CHLORIDE 0.9 % IV SOLN
200.0000 mg | Freq: Once | INTRAVENOUS | Status: AC
  Administered 2021-01-12: 200 mg via INTRAVENOUS
  Filled 2021-01-12 (×2): qty 20

## 2021-01-12 MED ORDER — ROCURONIUM BROMIDE 10 MG/ML (PF) SYRINGE
PREFILLED_SYRINGE | INTRAVENOUS | Status: AC
  Filled 2021-01-12: qty 10

## 2021-01-12 MED ORDER — FENTANYL CITRATE (PF) 100 MCG/2ML IJ SOLN
25.0000 ug | INTRAMUSCULAR | Status: DC | PRN
  Administered 2021-01-20: 25 ug via INTRAVENOUS
  Filled 2021-01-12 (×3): qty 2

## 2021-01-12 MED ORDER — PHENOBARBITAL 64.8 MG PO TABS: 64.8000 mg | ORAL_TABLET | Freq: Once | ORAL | Status: DC

## 2021-01-12 MED ORDER — LORAZEPAM 2 MG/ML IJ SOLN
1.0000 mg | INTRAMUSCULAR | Status: DC | PRN
  Administered 2021-01-12: 1 mg via INTRAVENOUS
  Administered 2021-01-12 – 2021-01-16 (×10): 2 mg via INTRAVENOUS
  Filled 2021-01-12 (×17): qty 1

## 2021-01-12 MED ORDER — HEPARIN SODIUM (PORCINE) 5000 UNIT/ML IJ SOLN
5000.0000 [IU] | Freq: Three times a day (TID) | INTRAMUSCULAR | Status: DC
  Administered 2021-01-12 – 2021-01-13 (×2): 5000 [IU] via SUBCUTANEOUS
  Filled 2021-01-12 (×2): qty 1

## 2021-01-12 MED ORDER — CALCIUM GLUCONATE-NACL 1-0.675 GM/50ML-% IV SOLN
1.0000 g | Freq: Once | INTRAVENOUS | Status: AC
  Administered 2021-01-12: 1000 mg via INTRAVENOUS
  Filled 2021-01-12: qty 50

## 2021-01-12 MED ORDER — ETOMIDATE 2 MG/ML IV SOLN
20.0000 mg | Freq: Once | INTRAVENOUS | Status: AC
  Administered 2021-01-12: 20 mg via INTRAVENOUS

## 2021-01-12 MED ORDER — SODIUM CHLORIDE 0.9 % IV SOLN
150.0000 mg | Freq: Two times a day (BID) | INTRAVENOUS | Status: DC
  Administered 2021-01-12 – 2021-01-15 (×6): 150 mg via INTRAVENOUS
  Filled 2021-01-12 (×10): qty 3

## 2021-01-12 MED ORDER — LEVETIRACETAM IN NACL 1000 MG/100ML IV SOLN: 1000.0000 mg | Freq: Two times a day (BID) | INTRAVENOUS | Status: DC

## 2021-01-12 MED ORDER — IOHEXOL 350 MG/ML SOLN
100.0000 mL | Freq: Once | INTRAVENOUS | Status: AC
  Administered 2021-01-12: 100 mL via INTRAVENOUS

## 2021-01-12 MED ORDER — SODIUM CHLORIDE 0.45 % IV SOLN: INTRAVENOUS | Status: DC

## 2021-01-12 MED ORDER — ENOXAPARIN SODIUM 40 MG/0.4ML IJ SOSY: 40.0000 mg | PREFILLED_SYRINGE | INTRAMUSCULAR | Status: DC

## 2021-01-12 MED ORDER — PROPOFOL 1000 MG/100ML IV EMUL
50.0000 ug/kg/min | INTRAVENOUS | Status: DC
  Administered 2021-01-13: 70 ug/kg/min via INTRAVENOUS
  Administered 2021-01-13: 50 ug/kg/min via INTRAVENOUS
  Filled 2021-01-12 (×2): qty 100

## 2021-01-12 MED ORDER — SODIUM CHLORIDE 0.45 % IV SOLN
INTRAVENOUS | Status: DC
  Filled 2021-01-12 (×2): qty 1000

## 2021-01-12 MED ORDER — FENTANYL CITRATE (PF) 100 MCG/2ML IJ SOLN
25.0000 ug | INTRAMUSCULAR | Status: DC | PRN
  Administered 2021-01-12 – 2021-01-16 (×9): 100 ug via INTRAVENOUS
  Filled 2021-01-12 (×8): qty 2

## 2021-01-12 MED ORDER — PHENOBARBITAL SODIUM 65 MG/ML IJ SOLN
64.8000 mg | Freq: Once | INTRAMUSCULAR | Status: DC
  Filled 2021-01-12: qty 1

## 2021-01-12 MED ORDER — LORAZEPAM 2 MG/ML IJ SOLN
INTRAMUSCULAR | Status: AC
  Filled 2021-01-12: qty 1

## 2021-01-12 MED ORDER — POTASSIUM CHLORIDE 10 MEQ/100ML IV SOLN
10.0000 meq | INTRAVENOUS | Status: AC
  Administered 2021-01-12 (×3): 10 meq via INTRAVENOUS
  Filled 2021-01-12: qty 100

## 2021-01-12 MED ORDER — SODIUM CHLORIDE 0.9 % IV SOLN: INTRAVENOUS | Status: DC

## 2021-01-12 MED ORDER — LEVETIRACETAM IN NACL 500 MG/100ML IV SOLN
500.0000 mg | Freq: Two times a day (BID) | INTRAVENOUS | Status: DC
  Filled 2021-01-12: qty 100

## 2021-01-12 MED ORDER — LEVETIRACETAM IN NACL 1000 MG/100ML IV SOLN
1000.0000 mg | Freq: Once | INTRAVENOUS | Status: AC
  Administered 2021-01-12: 1000 mg via INTRAVENOUS
  Filled 2021-01-12: qty 100

## 2021-01-12 MED ORDER — DOCUSATE SODIUM 50 MG/5ML PO LIQD: 100.0000 mg | Freq: Two times a day (BID) | ORAL | Status: DC | PRN

## 2021-01-12 MED ORDER — POTASSIUM CHLORIDE IN NACL 40-0.9 MEQ/L-% IV SOLN
INTRAVENOUS | Status: DC
  Filled 2021-01-12 (×2): qty 1000

## 2021-01-12 MED ORDER — LEVETIRACETAM 500 MG PO TABS: 1000.0000 mg | ORAL_TABLET | Freq: Two times a day (BID) | ORAL | Status: DC

## 2021-01-12 MED ORDER — PANTOPRAZOLE SODIUM 40 MG IV SOLR
40.0000 mg | Freq: Every day | INTRAVENOUS | Status: DC
  Administered 2021-01-12: 40 mg via INTRAVENOUS
  Filled 2021-01-12: qty 40

## 2021-01-12 MED ORDER — PREGABALIN 75 MG PO CAPS
150.0000 mg | ORAL_CAPSULE | Freq: Two times a day (BID) | ORAL | Status: DC
  Filled 2021-01-12: qty 2

## 2021-01-12 MED ORDER — MIDAZOLAM HCL 2 MG/2ML IJ SOLN
2.0000 mg | Freq: Once | INTRAMUSCULAR | Status: AC
  Administered 2021-01-12: 2 mg via INTRAVENOUS

## 2021-01-12 MED ORDER — PHENOBARBITAL SODIUM 65 MG/ML IJ SOLN: 65.0000 mg | Freq: Two times a day (BID) | INTRAMUSCULAR | Status: DC

## 2021-01-12 MED ORDER — DOCUSATE SODIUM 50 MG/5ML PO LIQD
100.0000 mg | Freq: Two times a day (BID) | ORAL | Status: DC
  Administered 2021-01-14 – 2021-01-22 (×17): 100 mg
  Filled 2021-01-12 (×17): qty 10

## 2021-01-12 NOTE — ED Provider Notes (Signed)
[facility] EMERGENCY DEPARTMENT Provider Note   CSN: [id number] Arrival date & time: 01/07/2021  5102     History Chief Complaint  Patient presents with  . Seizures  . Stroke Symptoms    Samantha Atkins is a 70 y.o. female.  HPI     This is a 70 year old female with a past medical history significant for seizures, ovarian cancer who presents with seizure-like activity.  Per EMS they were called out for seizure-like activity.  Patient currently on a 48-hour EEG monitor.  Last seen normal around 5:30 AM per EMS.  She received 5 mg of IM Versed but continued to seize in route.  She subsequently received 2.5 mg of IV Versed after discussing with me over the radio.  Upon arrival to the ED, patient with rhythmic movements of the right upper extremity and roving eye movements.  She was subsequently given 2 additional milligrams of IV Ativan.  She became more responsive.  She is following commands.  She clearly had a left-sided facial droop, left-sided neglect, left upper extremity weakness.  Code stroke was initiated at that time.  Last seen normal 5:30 AM.  Level 5 caveat for acuity of condition   Past Medical History:  Diagnosis Date  . Anxiety   . Arthritis    hands  . Cancer (HCC)    HISTORY OF OVARIAN CANCER  . Depression   . Fracture of humerus, proximal, left, closed 08/07/2015  . Fracture, humerus closed 07-31-15 fell   left  . GERD (gastroesophageal reflux disease)   . Headache   . Insomnia   . Migraines   . Osteoporosis   . S/P right hip fracture 1990   fractures X 2  . Seizures [facility])     Patient Active Problem List   Diagnosis Date Noted  . Chronic bilateral pleural effusions   . Acute combined systolic and diastolic CHF, NYHA class 1 ([place])   . Polysubstance abuse ([place])   . Acute blood loss anemia   . Seizures ([place]) 01/29/2018  . Status epilepticus ([place])   . Hypotension   . Acute respiratory failure with hypoxia ([place])   . Endotracheally  intubated   . Protein-calorie malnutrition, severe 02/15/2017  . Hip fracture ([place]) 02/12/2017  . Osteoporosis   . Migraines   . Depression   . Fracture of humerus, proximal, left, closed 08/07/2015    Past Surgical History:  Procedure Laterality Date  . ABDOMINAL HYSTERECTOMY    . FEMUR FRACTURE SURGERY Left    fell, redo hip replacement  . JOINT REPLACEMENT Bilateral    hip  . ORIF DISTAL RADIUS FRACTURE Left 03-2015  . ORIF HUMERUS FRACTURE Left 08/07/2015   Procedure: LEFT OPEN REDUCTION INTERNAL FIXATION (ORIF) PROXIMAL HUMERUS FRACTURE;  Surgeon: Marchia Bond, MD;  Location: [place];  Service: Orthopedics;  Laterality: Left;  . TONSILLECTOMY    . TOTAL HIP REVISION Left 02/14/2017   Procedure: LEFT ORIF PERIPROSTHETIC FEMORAL FRACTURE AND REVISION HIP ARTHROPLASTY;  Surgeon: Paralee Cancel, MD;  Location: [facility];  Service: Orthopedics;  Laterality: Left;     OB History    Gravida  0   Para  0   Term  0   Preterm  0   AB  0   Living        SAB  0   IAB  0   Ectopic  0   Multiple      Live Births  Family History  Problem Relation Age of Onset  . Hip fracture Mother   . Heart Problems Father   . Alcohol abuse Father   . Drug abuse Father   . Anxiety disorder Father   . Parkinson's disease Brother   . Healthy Daughter     Social History   Tobacco Use  . Smoking status: Never Smoker  . Smokeless tobacco: Never Used  Vaping Use  . Vaping Use: Never used  Substance Use Topics  . Alcohol use: No  . Drug use: No    Home Medications Prior to Admission medications   Medication Sig Start Date End Date Taking? Authorizing Provider  calcium citrate-vitamin D (CITRACAL+D) 315-200 MG-UNIT tablet Take 1 tablet by mouth 2 (two) times daily.    [provider]  cholecalciferol (VITAMIN D) 1000 units tablet Take 1,000 Units by mouth daily.    [provider]  eletriptan (RELPAX) 20 MG tablet May repeat in  2 hours if headache persists or recurs. 08/31/20   Cameron Sprang, MD  feeding supplement (BOOST HIGH PROTEIN) LIQD Take 1 Container by mouth daily.    [provider]  ferrous sulfate 325 (65 FE) MG EC tablet Take 1 tablet (325 mg total) by mouth every other day. Patient taking differently: Take 325 mg by mouth daily with breakfast. 02/17/17 01/29/20  Florencia Reasons, MD  FETZIMA 40 MG CP24 Take 1 capsule by mouth daily. 09/25/20   Plovsky, Berneta Sages, MD  hydrOXYzine (VISTARIL) 25 MG capsule 2  qhs 09/25/20   Plovsky, Berneta Sages, MD  ibuprofen (ADVIL) 800 MG tablet Take 800 mg by mouth 2 (two) times daily. 12/10/20   [provider]  levETIRAcetam (KEPPRA) 500 MG tablet Take 1 tablet twice a day 12/25/20   Cameron Sprang, MD  LORazepam (ATIVAN) 1 MG tablet Take 1 tablet (1 mg total) by mouth every 4 (four) hours as needed for anxiety. 02/13/18   Georgette Shell, MD  Multiple Vitamin (MULTIVITAMIN WITH MINERALS) TABS tablet Take 1 tablet by mouth daily.    [provider]  omeprazole (PRILOSEC) 20 MG capsule Take 20 mg by mouth daily.    [provider]  PHENobarbital (LUMINAL) 64.8 MG tablet Take 1 tablet (64.8 mg total) by mouth 2 (two) times daily. 08/31/20   Cameron Sprang, MD  potassium chloride SA (K-DUR,KLOR-CON) 20 MEQ tablet Take 20 mEq by mouth daily.    [provider]  pregabalin (LYRICA) 150 MG capsule TAKE 1 CAPSULE BY MOUTH TWICE A DAY Patient taking differently: daily. 09/28/20   Cameron Sprang, MD  psyllium (HYDROCIL/METAMUCIL) 95 % PACK Take 1 packet by mouth daily.    [provider]  senna-docusate (SENOKOT-S) 8.6-50 MG tablet Take 1 tablet by mouth at bedtime. 02/17/17   Florencia Reasons, MD  traMADol (ULTRAM) 50 MG tablet Take 50 mg by mouth every 6 (six) hours. 12/18/20   [provider]    Allergies    Dust mite extract  Review of Systems   Review of Systems  Unable to perform ROS: Acuity of condition    Physical Exam Updated  Vital Signs BP (!) 142/86   Pulse (!) 103   Temp (!) 97.3 F (36.3 C) (Oral)   Resp 13   SpO2 99%   Physical Exam Vitals and nursing note reviewed.  Constitutional:      Appearance: She is well-developed.     Comments: ABCs intact, ill-appearing  HENT:     Head: Normocephalic and  atraumatic.     Comments: EEG leads in place    Mouth/Throat:     Mouth: Mucous membranes are dry.  Eyes:     Pupils: Pupils are equal, round, and reactive to light.  Cardiovascular:     Rate and Rhythm: Normal rate and regular rhythm.     Heart sounds: Normal heart sounds.  Pulmonary:     Effort: Pulmonary effort is normal. No respiratory distress.     Breath sounds: No wheezing.  Abdominal:     General: Bowel sounds are normal.     Palpations: Abdomen is soft.  Musculoskeletal:     Cervical back: Neck supple.     Right lower leg: No edema.     Left lower leg: No edema.  Skin:    General: Skin is warm and dry.  Neurological:     Mental Status: She is alert.     Comments: Unable to assess orientation, left neglect, left facial droop, follows simple commands, flaccid paralysis left upper extremity  Psychiatric:     Comments: Unable to assess     ED Results / Procedures / Treatments   Labs (all labs ordered are listed, but only abnormal results are displayed) Labs Reviewed  CBC - Abnormal; Notable for the following components:      Result Value   Hemoglobin 15.2 (*)    MCV 104.6 (*)    MCH 34.8 (*)    Platelets 433 (*)    All other components within normal limits  DIFFERENTIAL - Abnormal; Notable for the following components:   Neutro Abs 8.0 (*)    All other components within normal limits  I-STAT CHEM 8, ED - Abnormal; Notable for the following components:   Potassium 2.8 (*)    Glucose, Bld 204 (*)    Calcium, Ion 0.91 (*)    TCO2 15 (*)    Hemoglobin 15.6 (*)    All other components within normal limits  CBG MONITORING, ED - Abnormal; Notable for the following components:    Glucose-Capillary 198 (*)    All other components within normal limits  RESP PANEL BY RT-PCR (FLU A&B, COVID) ARPGX2  PROTIME-INR  APTT  ETHANOL  COMPREHENSIVE METABOLIC PANEL  RAPID URINE DRUG SCREEN, HOSP PERFORMED  URINALYSIS, ROUTINE W REFLEX MICROSCOPIC    EKG EKG Interpretation  Date/Time:  Tuesday January 12 2021 06:30:04 EDT Ventricular Rate:  103 PR Interval:  112 QRS Duration: 114 QT Interval:  383 QTC Calculation: 502 R Axis:   -89 Text Interpretation: Sinus tachycardia Right atrial enlargement Incomplete right bundle branch block Abnormal R-wave progression, late transition Inferior infarct, old Prolonged QT interval Confirmed by Thayer Jew [phone]) on 01/15/2021 6:33:23 AM   Radiology CT CEREBRAL PERFUSION W CONTRAST  Result Date: 01/28/2021 CLINICAL DATA:  Unresponsive. Possible seizure although focally weak on the left with neglect. EXAM: CT ANGIOGRAPHY HEAD AND NECK CT PERFUSION BRAIN TECHNIQUE: Multidetector CT imaging of the head and neck was performed using the standard protocol during bolus administration of intravenous contrast. Multiplanar CT image reconstructions and MIPs were obtained to evaluate the vascular anatomy. Carotid stenosis measurements (when applicable) are obtained utilizing NASCET criteria, using the distal internal carotid diameter as the denominator. Multiphase CT imaging of the brain was performed following IV bolus contrast injection. Subsequent parametric perfusion maps were calculated using RAPID software. CONTRAST:  Dose is not yet known on this in progress study COMPARISON:  Preceding head CT. FINDINGS: CTA NECK FINDINGS Aortic arch: Atheromatous plaque.  Three vessel  branching. Right carotid system: Mild plaque at the bifurcation without stenosis or ulceration. ICA tortuosity with mild kinking. Left carotid system: No evidence of dissection, stenosis (50% or greater) or occlusion. Vertebral arteries: No proximal subclavian stenosis. Both  vertebral arteries are patent to the dura. Skeleton: Chronic C2 bilateral pedicle fracture with C2-3 and C3-4 anterolisthesis. These findings were also reported on a 2017 cervical spine CT. The anterolisthesis has increased from 2017. Other neck: No emergent finding Upper chest: Minimal ground-glass density in the right upper lobe which is likely inflammatory. Review of the MIP images confirms the above findings CTA HEAD FINDINGS Anterior circulation: No significant stenosis, proximal occlusion, aneurysm, or vascular malformation. Posterior circulation: No significant stenosis, proximal occlusion, aneurysm, or vascular malformation. Venous sinuses: Non-opacified Anatomic variants: None significant Review of the MIP images confirms the above findings CT Brain Perfusion Findings: ASPECTS: 10 CBF (<30%) Volume: 78mL Perfusion (Tmax>6.0s) volume: 52mL Mismatch Volume: 24mL IMPRESSION: 1. No emergent large vessel occlusion or proximal flow limiting stenosis. 2. Ischemic CT perfusion values in the posterior right hemisphere but no correlative arterial stenosis or occlusion. 3. Mild atherosclerosis. Electronically Signed   By: Monte Fantasia M.D.   On: 01/22/2021 07:10   CT HEAD CODE STROKE WO CONTRAST  Result Date: 01/23/2021 CLINICAL DATA:  Code stroke.  Left-sided paralysis EXAM: CT HEAD WITHOUT CONTRAST TECHNIQUE: Contiguous axial images were obtained from the base of the skull through the vertex without intravenous contrast. COMPARISON:  01/28/2018 FINDINGS: Brain: No evidence of acute infarction, hemorrhage, hydrocephalus, extra-axial collection or mass lesion/mass effect. Generalized atrophy and periventricular chronic small vessel ischemia. Chronic appearing lacune at the posterior left putamen. Vascular: No focal hyperdense vessel. Skull: Normal. Negative for fracture or focal lesion. Sinuses/Orbits: No acute finding. Other: These results were communicated to Dr Lorrin Goodell at 6:46 amon 06/14/2022by text page via  the [facility] messaging system. ASPECTS [facility] Stroke Program Early CT Score) - Ganglionic level infarction (caudate, lentiform nuclei, internal capsule, insula, M1-M3 cortex): 7 - Supraganglionic infarction (M4-M6 cortex): 3 Total score (0-10 with 10 being normal): 10 IMPRESSION: 1. No acute finding. 2. Generalized atrophy and chronic small vessel ischemia. Electronically Signed   By: Monte Fantasia M.D.   On: 01/11/2021 06:47   CT ANGIO HEAD CODE STROKE  Result Date: 01/19/2021 CLINICAL DATA:  Unresponsive. Possible seizure although focally weak on the left with neglect. EXAM: CT ANGIOGRAPHY HEAD AND NECK CT PERFUSION BRAIN TECHNIQUE: Multidetector CT imaging of the head and neck was performed using the standard protocol during bolus administration of intravenous contrast. Multiplanar CT image reconstructions and MIPs were obtained to evaluate the vascular anatomy. Carotid stenosis measurements (when applicable) are obtained utilizing NASCET criteria, using the distal internal carotid diameter as the denominator. Multiphase CT imaging of the brain was performed following IV bolus contrast injection. Subsequent parametric perfusion maps were calculated using RAPID software. CONTRAST:  Dose is not yet known on this in progress study COMPARISON:  Preceding head CT. FINDINGS: CTA NECK FINDINGS Aortic arch: Atheromatous plaque.  Three vessel branching. Right carotid system: Mild plaque at the bifurcation without stenosis or ulceration. ICA tortuosity with mild kinking. Left carotid system: No evidence of dissection, stenosis (50% or greater) or occlusion. Vertebral arteries: No proximal subclavian stenosis. Both vertebral arteries are patent to the dura. Skeleton: Chronic C2 bilateral pedicle fracture with C2-3 and C3-4 anterolisthesis. These findings were also reported on a 2017 cervical spine CT. The anterolisthesis has increased from 2017. Other neck: No emergent finding Upper chest: Minimal ground-glass  density  in the right upper lobe which is likely inflammatory. Review of the MIP images confirms the above findings CTA HEAD FINDINGS Anterior circulation: No significant stenosis, proximal occlusion, aneurysm, or vascular malformation. Posterior circulation: No significant stenosis, proximal occlusion, aneurysm, or vascular malformation. Venous sinuses: Non-opacified Anatomic variants: None significant Review of the MIP images confirms the above findings CT Brain Perfusion Findings: ASPECTS: 10 CBF (<30%) Volume: 52mL Perfusion (Tmax>6.0s) volume: 64mL Mismatch Volume: 65mL IMPRESSION: 1. No emergent large vessel occlusion or proximal flow limiting stenosis. 2. Ischemic CT perfusion values in the posterior right hemisphere but no correlative arterial stenosis or occlusion. 3. Mild atherosclerosis. Electronically Signed   By: Monte Fantasia M.D.   On: 01/31/2021 07:10   CT ANGIO NECK CODE STROKE  Result Date: 01/10/2021 CLINICAL DATA:  Unresponsive. Possible seizure although focally weak on the left with neglect. EXAM: CT ANGIOGRAPHY HEAD AND NECK CT PERFUSION BRAIN TECHNIQUE: Multidetector CT imaging of the head and neck was performed using the standard protocol during bolus administration of intravenous contrast. Multiplanar CT image reconstructions and MIPs were obtained to evaluate the vascular anatomy. Carotid stenosis measurements (when applicable) are obtained utilizing NASCET criteria, using the distal internal carotid diameter as the denominator. Multiphase CT imaging of the brain was performed following IV bolus contrast injection. Subsequent parametric perfusion maps were calculated using RAPID software. CONTRAST:  Dose is not yet known on this in progress study COMPARISON:  Preceding head CT. FINDINGS: CTA NECK FINDINGS Aortic arch: Atheromatous plaque.  Three vessel branching. Right carotid system: Mild plaque at the bifurcation without stenosis or ulceration. ICA tortuosity with mild kinking. Left carotid  system: No evidence of dissection, stenosis (50% or greater) or occlusion. Vertebral arteries: No proximal subclavian stenosis. Both vertebral arteries are patent to the dura. Skeleton: Chronic C2 bilateral pedicle fracture with C2-3 and C3-4 anterolisthesis. These findings were also reported on a 2017 cervical spine CT. The anterolisthesis has increased from 2017. Other neck: No emergent finding Upper chest: Minimal ground-glass density in the right upper lobe which is likely inflammatory. Review of the MIP images confirms the above findings CTA HEAD FINDINGS Anterior circulation: No significant stenosis, proximal occlusion, aneurysm, or vascular malformation. Posterior circulation: No significant stenosis, proximal occlusion, aneurysm, or vascular malformation. Venous sinuses: Non-opacified Anatomic variants: None significant Review of the MIP images confirms the above findings CT Brain Perfusion Findings: ASPECTS: 10 CBF (<30%) Volume: 51mL Perfusion (Tmax>6.0s) volume: 4mL Mismatch Volume: 44mL IMPRESSION: 1. No emergent large vessel occlusion or proximal flow limiting stenosis. 2. Ischemic CT perfusion values in the posterior right hemisphere but no correlative arterial stenosis or occlusion. 3. Mild atherosclerosis. Electronically Signed   By: Monte Fantasia M.D.   On: 01/17/2021 07:10    Procedures .Critical Care Performed by: Merryl Hacker, MD Authorized by: Merryl Hacker, MD   Critical care provider statement:    Critical care time (minutes):  45   Critical care was necessary to treat or prevent imminent or life-threatening deterioration of the following conditions:  CNS failure or compromise   Critical care was time spent personally by me on the following activities:  Discussions with consultants, evaluation of patient's response to treatment, examination of patient, ordering and performing treatments and interventions, ordering and review of laboratory studies, ordering and review of  radiographic studies, pulse oximetry, re-evaluation of patient's condition, obtaining history from patient or surrogate and review of old charts     Medications Ordered in ED Medications  iohexol (OMNIPAQUE) 350 MG/ML  injection 100 mL (100 mLs Intravenous Contrast Given 01/26/2021 0720)    ED Course  I have reviewed the triage vital signs and the nursing notes.  Pertinent labs & imaging results that were available during my care of the patient were reviewed by me and considered in my medical decision making (see chart for details).    MDM Rules/Calculators/A&P                          Patient presents with reported seizure activity at home.  Appeared to be seizing on my initial evaluation.  She improved significantly with 2 mg of Ativan and then can follow commands.  She had significant left-sided deficits.  Differential includes acute stroke versus Todd's paralysis given seizure activity.  Given the profound nature of her deficit, code stroke was initiated for mobilization of resources in acute neurology evaluation.  CT scan shows no evidence of bleeding.  Per neurology, further imaging obtained.  I spoke to the patient's husband who stated that she had not been taking her [place] since Saturday.  They loaded her with IV Keppra.  Clinically improving.  High suspicion for Todd's paralysis with lower suspicion for acute stroke.  Patient will need admission as she required multiple doses of benzodiazepine to break her seizure. Final Clinical Impression(s) / ED Diagnoses Final diagnoses:  Seizure [facility])    Rx / [place] Orders ED Discharge Orders    None       Jeremian Whitby, Barbette Hair, MD 01/10/2021 [phone]

## 2021-01-12 NOTE — Procedures (Signed)
Central Venous Catheter Insertion Procedure Note  Zaynah Chawla  [id number]  09-08-1950  Date:01/26/2021  Time:10:36 PM   Provider Performing:Dason Mosley Cleon Dew   Procedure: Insertion of Non-tunneled Central Venous [phone]) with US guidance (84696)   Indication(s) Medication administration  Consent Risks of the procedure as well as the alternatives and risks of each were explained to the patient and/or caregiver.  Consent for the procedure was obtained and is signed in the bedside chart  Anesthesia Topical only with 1% lidocaine   Timeout Verified patient identification, verified procedure, site/side was marked, verified correct patient position, special equipment/implants available, medications/allergies/relevant history reviewed, required imaging and test results available.  Sterile Technique Maximal sterile technique including full sterile barrier drape, hand hygiene, sterile gown, sterile gloves, mask, hair covering, sterile ultrasound probe cover (if used).  Procedure Description Area of catheter insertion was cleaned with chlorhexidine and draped in sterile fashion.  With real-time ultrasound guidance a central venous catheter was placed into the left internal jugular vein. Nonpulsatile blood flow and easy flushing noted in all ports.  The catheter was sutured in place and sterile dressing applied.  Complications/Tolerance None; patient tolerated the procedure well. Chest X-ray is ordered to verify placement for internal jugular or subclavian cannulation.   Chest x-ray is not ordered for femoral cannulation.  EBL Minimal  Specimen(s) None  [employer]., MSN, APRN, AGACNP-BC [facility]  01/07/2021 , 10:36 PM  Please see [email or web address] for pager details  If no response, please call [phone] After hours, please call Elink at [phone]

## 2021-01-12 NOTE — Procedures (Signed)
Intubation Procedure Note  Samantha Atkins  [id number]  01-10-1951  Date:01/26/2021  Time:10:33 PM   Provider Performing:Jasiel Apachito Chauncey Cruel Iona Beard    Procedure: Intubation (31500)  Indication(s) Respiratory Failure  Consent Risks of the procedure as well as the alternatives and risks of each were explained to the patient and/or caregiver.  Consent for the procedure was obtained and is signed in the bedside chart   Anesthesia Etomidate, Versed, Fentanyl and Rocuronium. See MAR for medication.   Time Out Verified patient identification, verified procedure, site/side was marked, verified correct patient position, special equipment/implants available, medications/allergies/relevant history reviewed, required imaging and test results available.   Sterile Technique Usual hand hygeine, masks, and gloves were used   Procedure Description Patient positioned in bed supine.  Sedation given as noted above.  Patient was intubated with endotracheal tube using Glidescope.  View was Grade 1 full glottis .  Number of attempts was 1.  Colorimetric CO2 detector was consistent with tracheal placement.   Complications/Tolerance None; patient tolerated the procedure well. Chest X-ray is ordered to verify placement.   EBL Minimal   Specimen(s) None  [employer]mond [employer]., MSN, APRN, AGACNP-BC [facility]  01/29/2021 , 10:35 PM  Please see [email or web address] for pager details  If no response, please call [phone] After hours, please call Elink at [phone]

## 2021-01-12 NOTE — Progress Notes (Signed)
Phenytoin Initial Consult Indication: IV maintenance dosing for focal seizures  Allergies  Allergen Reactions  . Dust Mite Extract Cough    Patient Measurements: Weight: 38.9 kg (85 lb 12.8 oz)   Body mass index is 15.69 kg/m.   Vital signs: Temp: 99.6 F (37.6 C) (06/07 1311) Temp Source: Rectal (06/07 1311) BP: 151/85 (06/07 1530) Pulse Rate: 104 (06/07 1530)  Labs: Lab Results  Component Value Date/Time   Albumin 3.8 01/06/2021 0634   Lab Results  Component Value Date   PHENYTOIN 4.3 (L) 02/10/2018   Estimated Creatinine Clearance: 40.8 mL/min (by C-G formula based on SCr of 0.5 mg/dL).   Medications:  (Not in a hospital admission)  . heparin injection (subcutaneous)  5,000 Units Subcutaneous Q8H  . pantoprazole (PROTONIX) IV  40 mg Intravenous QHS  . PHENObarbital  300 mg Intravenous STAT  . phenytoin  150 mg Oral BID  . pregabalin  150 mg Oral BID      Assessment: Patient presented in status epilepticus, seizures now resolved. Patient given fosphenytoin 20mg /kg loading dose 6/7 at 1330  Corrected phenytoin level (if needed): TBD Seizure activity: patient appears to have ceased seizing post loading medications. Significant potential drug interactions: phenobarbital   Goals of care:  Total phenytoin level: 10-20 mcg/ml Free phenytoin level: 1-2 mcg/ml  Plan:  Phenytoin level scheduled two hours following completion of IV loading dose  Anticipated maintenance dose: phenytonin 150mg  IV/PO twice daily starting 6/7 at 2200 Pharmacy will continue to follow regarding obtaining total phenytoin levels and dose adjustments as indicated.   Norina Buzzard, PharmD PGY1 Pharmacy Resident 01/20/2021 3:53 PM

## 2021-01-12 NOTE — ED Provider Notes (Signed)
Patient signed to me by Dr. Dina Rich pending neurology recommendations.  Per their recommendations, patient will be admitted for further observation   Lacretia Leigh, MD 01/30/2021 [phone]

## 2021-01-12 NOTE — Consult Note (Signed)
NEUROLOGY CONSULTATION NOTE   Date of service: January 12, 2021 Patient Name: Samantha Atkins MRN:  [id number] DOB:  12-04-1950 Reason for consult: "Stroke code" Requesting Provider: Merryl Hacker, MD _ _ _   _ __   _ __ _ _  __ __   _ __   __ _  History of Present Illness  Samantha Atkins is a 70 y.o. female with PMH significant for GERD, Depression, Insomnia, Migraines, Seizures on Keppra, Phenobard and [place] who presents with seizure.  Husband reports the patient has been anxious all weekend and has been in the bed for most of the day.  Around 7 PM last night, she started throwing up and has been throwing up every few hours since.  She threw up lasted around 5 AM on 01/31/2021.  Husband left to get some water for her and heard some collateral and by the time he got here, found her to be in full-blown seizure.  He called EMS and she was brought into the emergency department.  In route, she had a 2-minute seizure episode was given Versed.  She had another seizure in the ED and was given Ativan.  Husband reports that she has been noncompliant with the medication and has not taken any Keppra or phenobarb since Saturday.  Husband reports that she has been having episodes that lasts from several minutes to hours with personality changes, difficulty following commands, poor motor control, unable to talk a lot.  She cannot understanding anything complex and does not speak more than a few words during these episodes.  Reports they saw the neurologist recently for these episodes and she was put on a 48-hour ambulatory EEG.  In the ED, she was noted to have plegia on the left side with neglect and a right gaze deviation and a stroke code was called.  Discussed with husband and he reports that she has never had anything like this in the past.  LKW of 0530 MRS: 3 TPA: not offered, suspect this is likely post ictal todds rather than stroke. Thrombectoym: Not offered, no LVO NIHSS components Score: Comment  1a  Level of Conscious 0[x]  1[]  2[]  3[]      1b LOC Questions 0[x]  1[]  2[]       1c LOC Commands 0[x]  1[]  2[]       2 Best Gaze 0[]  1[]  2[x]       3 Visual 0[]  1[]  2[x]  3[]      4 Facial Palsy 0[x]  1[]  2[]  3[]      5a Motor Arm - left 0[]  1[x]  2[]  3[]  4[]  UN[]    5b Motor Arm - Right 0[x]  1[]  2[]  3[]  4[]  UN[]    6a Motor Leg - Left 0[]  1[]  2[x]  3[]  4[]  UN[]    6b Motor Leg - Right 0[x]  1[]  2[]  3[]  4[]  UN[]    7 Limb Ataxia 0[x]  1[]  2[]  3[]  UN[]     8 Sensory 0[]  1[x]  2[]  UN[]      9 Best Language 0[x]  1[]  2[]  3[]      10 Dysarthria 0[x]  1[]  2[]  UN[]      11 Extinct. and Inattention 0[]  1[]  2[x]       TOTAL: 10       ROS   Constitutional Denies weight loss, fever and chills.   HEENT Denies changes in vision and hearing.   Respiratory Denies SOB and cough.   CV Denies palpitations and CP   GI Denies abdominal pain, nausea, vomiting and diarrhea.   GU Denies dysuria and urinary frequency.   MSK Denies  myalgia and joint pain.   Skin Denies rash and pruritus.   Neurological Denies headache and syncope.   Psychiatric Denies recent changes in mood. Denies anxiety and depression.    Past History   Past Medical History:  Diagnosis Date  . Anxiety   . Arthritis    hands  . Cancer (HCC)    HISTORY OF OVARIAN CANCER  . Depression   . Fracture of humerus, proximal, left, closed 08/07/2015  . Fracture, humerus closed 07-31-15 fell   left  . GERD (gastroesophageal reflux disease)   . Headache   . Insomnia   . Migraines   . Osteoporosis   . S/P right hip fracture 1990   fractures X 2  . Seizures ([place])    Past Surgical History:  Procedure Laterality Date  . ABDOMINAL HYSTERECTOMY    . FEMUR FRACTURE SURGERY Left    fell, redo hip replacement  . JOINT REPLACEMENT Bilateral    hip  . ORIF DISTAL RADIUS FRACTURE Left 03-2015  . ORIF HUMERUS FRACTURE Left 08/07/2015   Procedure: LEFT OPEN REDUCTION INTERNAL FIXATION (ORIF) PROXIMAL HUMERUS FRACTURE;  Surgeon: Marchia Bond, MD;  Location: [facility];  Service: Orthopedics;  Laterality: Left;  . TONSILLECTOMY    . TOTAL HIP REVISION Left 02/14/2017   Procedure: LEFT ORIF PERIPROSTHETIC FEMORAL FRACTURE AND REVISION HIP ARTHROPLASTY;  Surgeon: Paralee Cancel, MD;  Location: [facility];  Service: Orthopedics;  Laterality: Left;   Family History  Problem Relation Age of Onset  . Hip fracture Mother   . Heart Problems Father   . Alcohol abuse Father   . Drug abuse Father   . Anxiety disorder Father   . Parkinson's disease Brother   . Healthy Daughter    Social History   Socioeconomic History  . Marital status: Married    Spouse name: Not on file  . Number of children: Not on file  . Years of education: Not on file  . Highest education level: Not on file  Occupational History  . Not on file  Tobacco Use  . Smoking status: Never Smoker  . Smokeless tobacco: Never Used  Vaping Use  . Vaping Use: Never used  Substance and Sexual Activity  . Alcohol use: No  . Drug use: No  . Sexual activity: Not Currently  Other Topics Concern  . Not on file  Social History Narrative   Right handed      Associates Degree      Lives with husband in one story home       Social Determinants of Health   Financial Resource Strain: Not on file  Food Insecurity: Not on file  Transportation Needs: Not on file  Physical Activity: Not on file  Stress: Not on file  Social Connections: Not on file   Allergies  Allergen Reactions  . Dust Mite Extract Cough    Medications  (Not in a hospital admission)    Vitals   Vitals:   01/06/2021 0630  BP: (!) 142/86  Pulse: (!) 103  Resp: 13  Temp: (!) 97.3 F (36.3 C)  TempSrc: Oral  SpO2: 99%     There is no height or weight on file to calculate BMI.  Physical Exam   General: Laying comfortably in bed; in no acute distress.  HENT: Normal oropharynx and mucosa. Normal external appearance of ears and nose.  Neck: Supple, no pain or tenderness  CV: No JVD. No peripheral  edema.  Pulmonary: Symmetric Chest rise.  Normal respiratory effort.  Abdomen: Soft to touch, non-tender.  Ext: No cyanosis, edema, or deformity  Skin: No rash. Normal palpation of skin.   Musculoskeletal: Normal digits and nails by inspection. No clubbing.   Neurologic Examination  Mental status/Cognition: Alert, oriented to self. Speech/language: Fluent, comprehension intact, object naming intact, repetition intact.  Cranial nerves:   CN II Pupils equal and reactive to light, no VF deficits    CN III,IV,VI R gaze deviation   CN V normal sensation in V1, V2, and V3 segments bilaterally    CN VII no asymmetry, no nasolabial fold flattening   CN VIII normal hearing to speech    CN IX & X normal palatal elevation, no uvular deviation    CN XI 5/5 head turn and 5/5 shoulder shrug bilaterally    CN XII midline tongue protrusion    Motor:  Muscle bulk: poor, tone normal, pronator drift LUE drift. Mvmt Root Nerve  Muscle Right Left Comments  SA C5/6 Ax Deltoid 5 3   EF C5/6 Mc Biceps 5 3   EE C6/7/8 Rad Triceps 5 3   WF C6/7 Med FCR     WE C7/8 PIN ECU     F Ab C8/T1 U ADM/FDI 5 3   HF L1/2/3 Fem Illopsoas 4+ 2   KE L2/3/4 Fem Quad     DF L4/5 D Peron Tib Ant 5 2   PF S1/2 Tibial Grc/Sol 5 2    Reflexes:  Right Left Comments  Pectoralis      Biceps (C5/6) 1 1   Brachioradialis (C5/6) 1 1    Triceps (C6/7) 1 1    Patellar (L3/4) 1 1    Achilles (S1)      Hoffman      Plantar     Jaw jerk    Sensation:  Light touch Intact on the Right, exteincion on the left.   Pin prick    Temperature    Vibration   Proprioception    Coordination/Complex Motor:  - Finger to Nose intact on the Right. - Heel to shin unable to do. - Rapid alternating movement are slowed - Gait: Deferred.  Labs   CBC:  Recent Labs  Lab 01/31/2021 0640  HGB 15.6*  HCT 25.9    Basic Metabolic Panel:  Lab Results  Component Value Date   NA 136 01/18/2021   K 2.8 (L) 01/15/2021   CO2 25  12/23/2020   GLUCOSE 204 (H) 01/25/2021   BUN 11 01/08/2021   CREATININE 0.50 01/15/2021   CALCIUM 9.7 12/23/2020   GFRNONAA >60 02/13/2018   GFRAA >60 02/13/2018   Lipid Panel: No results found for: LDLCALC HgbA1c: No results found for: HGBA1C Urine Drug Screen:     Component Value Date/Time   LABOPIA NONE DETECTED 01/29/2018 0041   COCAINSCRNUR NONE DETECTED 01/29/2018 0041   LABBENZ POSITIVE (A) 01/29/2018 0041   AMPHETMU NONE DETECTED 01/29/2018 0041   THCU POSITIVE (A) 01/29/2018 0041   LABBARB (A) 01/29/2018 0041    Result not available. Reagent lot number recalled by manufacturer.    Alcohol Level     Component Value Date/Time   ETH <10 01/29/2018 0041    CT Head without contrast: CTH was negative for a large hypodensity concerning for a large territory infarct or hyperdensity concerning for an ICH.  CT angio Head and Neck with contrast: No LVO.  CT Perfusion: No mismatch.  MRI Brain: pending  rEEG:  pending  Impression   Samantha Atkins is a 70 y.o. female with PMH significant for GERD, Depression, Insomnia, Migraines, Seizures on Keppra, Phenobard and [place] who presents with seizure in the setting of non compliance with Keppra and Phenobarb. Her neurologic examination is notable for L sided weakness and R gaze deviation. CTH, CTA, CTP with no LVO, no obvious mismatch, if this was truly a stroke, I would expect a large part of the R hemisphere involved to cause such significant deficit. 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  I do not think that she is actively seizing right now and there was notable improvement in her left sided weakness between by initial evaluation and evaluation right after the CT scans.  Impression: Seizure Todds paresis Medication non compliance.  Recommendations  - I would recommend monitoring her closely for improvement in the left sided weakness. - Will give her Keppra load of 1000mg  IV once - Resume home Keppra  and Phenobarb - Due to the long half life of Phenobarb, I do not think that she needs a loading dose. - Seizure precautions - Ativan 2mg  IV PRN for seizure lasting more than 2 mins. ______________________________________________________________________  This patient is critically ill and at significant risk of neurological worsening, death and care requires constant monitoring of vital signs, hemodynamics,respiratory and cardiac monitoring, neurological assessment, discussion with family, other specialists and medical decision making of high complexity. I spent 60 minutes of neurocritical care time  in the care of  this patient. This was time spent independent of any time provided by nurse practitioner or PA.  Donnetta Simpers [facility] Pager Number [phone] 02/03/2021  7:39 AM  Thank you for the opportunity to take part in the care of this patient. If you have any further questions, please contact the neurology consultation attending.  Signed,  Lakeline Pager Number [phone] _ _ _   _ __   _ __ _ _  __ __   _ __   __ _

## 2021-01-12 NOTE — ED Notes (Addendum)
Pt began having seizure like activity, with twitching face, nystagmus, drooling, and arm contractures. Pt was able to verbally respond "yes" when asked if she could hear her name being called. Pt does not respond with any other words. She will follow some very simple commands. This activity was ongoing for at least 2-3 minutes before 2mg  of Ativan were administered. Pt still having twitching activity. Pt's mouth suctioned. Husband remains at bedside. Dr Harvest Forest messaged and came to bedside to evaluate.

## 2021-01-12 NOTE — H&P (Addendum)
History and Physical    Samantha Atkins WNU:[id number] DOB: 24-Dec-1950 DOA: 02/03/2021  Referring MD/NP/PA: Lacretia Leigh, MD PCP: Kathyrn Lass, MD  Consultants: Ellouise Newer, MD Patient coming from: Home via EMS  Chief Complaint: Seizure  I have personally briefly reviewed patient's old medical records in [place]   HPI: Samantha Atkins is a 70 y.o. female with medical history significant of seizures, anxiety, depression, and ovarian cancer who presented for seizure.  History is obtained from the patient's husband is present at bedside.  Apparently around 7 PM last night patient began to have several episodes of nausea and vomiting throughout the night.  She last threw up around 5 AM.  Her husband had went to go get some water and other things and heard patient knocking over things on the nightstand.  When he went back in the room patient was having a full blown generalized tonic-clonic seizure for which she says that she was foaming at the mouth and not responding.  EMS was called and was witnessed having a 2-minute seizure with them as well and given Versed.  Patient husband notes that she has been having the intermittent episodes that last several minutes with difficulty following commands, changes in speech, weakness, and personality changes over the last month.  Previously had been seizure-free since 2019 prior to seizures restarting on 12/15/2020.  She had been followed by Dr. Delice Lesch who had set her up with 48-hour EEG monitoring and wanted to obtain a MRI of the brain with and without contrast and her Keppra has been increased to 500 mg twice daily recently on 5/20.  ED Course: Upon admission to the emergency department patient was seen as a code stroke.  CT scan of the head without contrast noted no acute abnormalities.  Neurology evaluated the patient and suspected symptoms were related to seizure and findings.  Afebrile, pulse 100-1 19, respirations 19-22, blood pressures 142/86-177/97, and  O2 saturation maintained on room air.  Labs significant for hemoglobin 15.2, platelets 433, potassium 2.9, chloride 99, CO2 14, calcium 8.7, glucose 206, and anion gap 24.  Influenza and COVID-19 screening were negative.  CT angiogram of the head and neck with perfusion did not note any emergent large vessel occlusion, but did note ischemic CT perfusion values in the posterior right hemisphere no correlated arterial stenosis or occlusion.  Patient was given Keppra 1500 mg IV and continued on home regimen dose of Keppra and phenobarbital.  TRH called to admit.  Review of Systems  Unable to perform ROS: Mental status change    Past Medical History:  Diagnosis Date  . Anxiety   . Arthritis    hands  . Cancer (HCC)    HISTORY OF OVARIAN CANCER  . Depression   . Fracture of humerus, proximal, left, closed 08/07/2015  . Fracture, humerus closed 07-31-15 fell   left  . GERD (gastroesophageal reflux disease)   . Headache   . Insomnia   . Migraines   . Osteoporosis   . S/P right hip fracture 1990   fractures X 2  . Seizures ([place])     Past Surgical History:  Procedure Laterality Date  . ABDOMINAL HYSTERECTOMY    . FEMUR FRACTURE SURGERY Left    fell, redo hip replacement  . JOINT REPLACEMENT Bilateral    hip  . ORIF DISTAL RADIUS FRACTURE Left 03-2015  . ORIF HUMERUS FRACTURE Left 08/07/2015   Procedure: LEFT OPEN REDUCTION INTERNAL FIXATION (ORIF) PROXIMAL HUMERUS FRACTURE;  Surgeon: Vonna Kotyk  Mardelle Matte, MD;  Location: [facility];  Service: Orthopedics;  Laterality: Left;  . TONSILLECTOMY    . TOTAL HIP REVISION Left 02/14/2017   Procedure: LEFT ORIF PERIPROSTHETIC FEMORAL FRACTURE AND REVISION HIP ARTHROPLASTY;  Surgeon: Paralee Cancel, MD;  Location: [facility];  Service: Orthopedics;  Laterality: Left;     reports that she has never smoked. She has never used smokeless tobacco. She reports that she does not drink alcohol and does not use drugs.  Allergies  Allergen Reactions   . Dust Mite Extract Cough    Family History  Problem Relation Age of Onset  . Hip fracture Mother   . Heart Problems Father   . Alcohol abuse Father   . Drug abuse Father   . Anxiety disorder Father   . Parkinson's disease Brother   . Healthy Daughter     Prior to Admission medications   Medication Sig Start Date End Date Taking? Authorizing Provider  calcium citrate-vitamin D (CITRACAL+D) 315-200 MG-UNIT tablet Take 1 tablet by mouth 2 (two) times daily.    [provider]  cholecalciferol (VITAMIN D) 1000 units tablet Take 1,000 Units by mouth daily.    [provider]  eletriptan (RELPAX) 20 MG tablet May repeat in 2 hours if headache persists or recurs. 08/31/20   Cameron Sprang, MD  feeding supplement (BOOST HIGH PROTEIN) LIQD Take 1 Container by mouth daily.    [provider]  ferrous sulfate 325 (65 FE) MG EC tablet Take 1 tablet (325 mg total) by mouth every other day. Patient taking differently: Take 325 mg by mouth daily with breakfast. 02/17/17 01/29/20  Florencia Reasons, MD  FETZIMA 40 MG CP24 Take 1 capsule by mouth daily. 09/25/20   Plovsky, Berneta Sages, MD  hydrOXYzine (VISTARIL) 25 MG capsule 2  qhs 09/25/20   Plovsky, Berneta Sages, MD  ibuprofen (ADVIL) 800 MG tablet Take 800 mg by mouth 2 (two) times daily. 12/10/20   [provider]  levETIRAcetam (KEPPRA) 500 MG tablet Take 1 tablet twice a day 12/25/20   Cameron Sprang, MD  LORazepam (ATIVAN) 1 MG tablet Take 1 tablet (1 mg total) by mouth every 4 (four) hours as needed for anxiety. 02/13/18   Georgette Shell, MD  Multiple Vitamin (MULTIVITAMIN WITH MINERALS) TABS tablet Take 1 tablet by mouth daily.    [provider]  omeprazole (PRILOSEC) 20 MG capsule Take 20 mg by mouth daily.    [provider]  PHENobarbital (LUMINAL) 64.8 MG tablet Take 1 tablet (64.8 mg total) by mouth 2 (two) times daily. 08/31/20   Cameron Sprang, MD  potassium chloride SA (K-DUR,KLOR-CON) 20 MEQ tablet  Take 20 mEq by mouth daily.    [provider]  pregabalin (LYRICA) 150 MG capsule TAKE 1 CAPSULE BY MOUTH TWICE A DAY Patient taking differently: daily. 09/28/20   Cameron Sprang, MD  psyllium (HYDROCIL/METAMUCIL) 95 % PACK Take 1 packet by mouth daily.    [provider]  senna-docusate (SENOKOT-S) 8.6-50 MG tablet Take 1 tablet by mouth at bedtime. 02/17/17   Florencia Reasons, MD  traMADol (ULTRAM) 50 MG tablet Take 50 mg by mouth every 6 (six) hours. 12/18/20   [provider]    Physical Exam:  Constitutional: Elderly female who appears to be in some distress Vitals:   01/31/2021 0738 01/14/2021 0800 02/01/2021 0815 01/07/2021 0830  BP:  (!) 152/90 (!) 153/95 (!) 177/97  Pulse:  (!) 103 (!) 104 (!) 119  Resp:  20 (!) 22 19  Temp:      TempSrc:      SpO2:  99% 98% 98%  Weight: 38.9 kg      Eyes: PERRL, lids and conjunctivae normal ENMT: Mucous membranes are dry. Posterior pharynx clear of any exudate or lesions.   Neck: normal, supple, no masses, no thyromegaly Respiratory: clear to auscultation bilaterally, no wheezing, no crackles. Normal respiratory effort. No accessory muscle use.  Cardiovascular: Tachycardic no murmurs / rubs / gallops. No extremity edema. 2+ pedal pulses. No carotid bruits.  Abdomen: no tenderness, no masses palpated. No hepatosplenomegaly. Bowel sounds positive.  Musculoskeletal: no clubbing / cyanosis.  No acute deformity appreciated Skin: no rashes, lesions, ulcers. No induration Neurologic: Witnessed presumed seizure with head turn to the left with left left forward gaze case and twitching of bilateral upper and lower extremities.  After given 2 mg of Ativan IV patient noted to be able to answer some questions Psychiatric: Unable to assess as patient not currently back to baseline.    Labs on Admission: I have personally reviewed following labs and imaging studies  CBC: Recent Labs  Lab 02/04/2021 0634 01/21/2021 0640  WBC 9.3  --    NEUTROABS 8.0*  --   HGB 15.2* 15.6*  HCT 45.7 46.0  MCV 104.6*  --   PLT 433*  --    Basic Metabolic Panel: Recent Labs  Lab 01/11/2021 0634 01/07/2021 0640  NA 137 136  K 2.9* 2.8*  CL 99 102  CO2 14*  --   GLUCOSE 206* 204*  BUN 11 11  CREATININE 0.95 0.50  CALCIUM 8.7*  --    GFR: Estimated Creatinine Clearance: 40.8 mL/min (by C-G formula based on SCr of 0.5 mg/dL). Liver Function Tests: Recent Labs  Lab 01/08/2021 0634  AST 33  ALT 30  ALKPHOS 114  BILITOT 0.4  PROT 7.3  ALBUMIN 3.8   No results for input(s): LIPASE, AMYLASE in the last 168 hours. No results for input(s): AMMONIA in the last 168 hours. Coagulation Profile: Recent Labs  Lab 01/30/2021 0634  INR 1.0   Cardiac Enzymes: No results for input(s): CKTOTAL, CKMB, CKMBINDEX, TROPONINI in the last 168 hours. BNP (last 3 results) No results for input(s): PROBNP in the last 8760 hours. HbA1C: No results for input(s): HGBA1C in the last 72 hours. CBG: Recent Labs  Lab 01/20/2021 0630  GLUCAP 198*   Lipid Profile: No results for input(s): CHOL, HDL, LDLCALC, TRIG, CHOLHDL, LDLDIRECT in the last 72 hours. Thyroid Function Tests: No results for input(s): TSH, T4TOTAL, FREET4, T3FREE, THYROIDAB in the last 72 hours. Anemia Panel: No results for input(s): VITAMINB12, FOLATE, FERRITIN, TIBC, IRON, RETICCTPCT in the last 72 hours. Urine analysis:    Component Value Date/Time   COLORURINE YELLOW 12/23/2020 1623   APPEARANCEUR CLEAR 12/23/2020 1623   LABSPEC 1.020 12/23/2020 1623   PHURINE 6.0 12/23/2020 1623   GLUCOSEU NEGATIVE 12/23/2020 1623   HGBUR NEGATIVE 12/23/2020 1623   BILIRUBINUR NEGATIVE 12/23/2020 1623   KETONESUR >=80 (A) 12/23/2020 1623   PROTEINUR NEGATIVE 01/29/2018 1721   UROBILINOGEN 0.2 12/23/2020 1623   NITRITE NEGATIVE 12/23/2020 1623   LEUKOCYTESUR NEGATIVE 12/23/2020 1623   Sepsis Labs: Recent Results (from the past 240 hour(s))  Resp Panel by RT-PCR (Flu A&B, Covid)  Nasopharyngeal Swab     Status: None   Collection Time: 01/18/2021  6:31 AM   Specimen: Nasopharyngeal Swab; Nasopharyngeal(NP) swabs in vial transport medium  Result Value Ref Range Status  SARS Coronavirus 2 by RT PCR NEGATIVE NEGATIVE Final    Comment: (NOTE) SARS-CoV-2 target nucleic acids are NOT DETECTED.  The SARS-CoV-2 RNA is generally detectable in upper respiratory specimens during the acute phase of infection. The lowest concentration of SARS-CoV-2 viral copies this assay can detect is 138 copies/mL. A negative result does not preclude SARS-Cov-2 infection and should not be used as the sole basis for treatment or other patient management decisions. A negative result may occur with  improper specimen collection/handling, submission of specimen other than nasopharyngeal swab, presence of viral mutation(s) within the areas targeted by this assay, and inadequate number of viral copies(<138 copies/mL). A negative result must be combined with clinical observations, patient history, and epidemiological information. The expected result is Negative.  Fact Sheet for Patients:  EntrepreneurPulse.com.au  Fact Sheet for Healthcare Providers:  IncredibleEmployment.be  This test is no t yet approved or cleared by the [place] FDA and  has been authorized for detection and/or diagnosis of SARS-CoV-2 by FDA under an Emergency Use Authorization (EUA). This EUA will remain  in effect (meaning this test can be used) for the duration of the COVID-19 declaration under Section 564(b)(1) of the Act, 21 U.S.C.section 360bbb-3(b)(1), unless the authorization is terminated  or revoked sooner.       Influenza A by PCR NEGATIVE NEGATIVE Final   Influenza B by PCR NEGATIVE NEGATIVE Final    Comment: (NOTE) The Xpert Xpress SARS-CoV-2/FLU/RSV plus assay is intended as an aid in the diagnosis of influenza from Nasopharyngeal swab specimens and should not be  used as a sole basis for treatment. Nasal washings and aspirates are unacceptable for Xpert Xpress SARS-CoV-2/FLU/RSV testing.  Fact Sheet for Patients: EntrepreneurPulse.com.au  Fact Sheet for Healthcare Providers: IncredibleEmployment.be  This test is not yet approved or cleared by the [place] FDA and has been authorized for detection and/or diagnosis of SARS-CoV-2 by FDA under an Emergency Use Authorization (EUA). This EUA will remain in effect (meaning this test can be used) for the duration of the COVID-19 declaration under Section 564(b)(1) of the Act, 21 U.S.C. section 360bbb-3(b)(1), unless the authorization is terminated or revoked.  Performed at [facility], [place] [address], [place], [place] [address]      Radiological Exams on Admission: CT CEREBRAL PERFUSION W CONTRAST  Result Date: 01/26/2021 CLINICAL DATA:  Unresponsive. Possible seizure although focally weak on the left with neglect. EXAM: CT ANGIOGRAPHY HEAD AND NECK CT PERFUSION BRAIN TECHNIQUE: Multidetector CT imaging of the head and neck was performed using the standard protocol during bolus administration of intravenous contrast. Multiplanar CT image reconstructions and MIPs were obtained to evaluate the vascular anatomy. Carotid stenosis measurements (when applicable) are obtained utilizing NASCET criteria, using the distal internal carotid diameter as the denominator. Multiphase CT imaging of the brain was performed following IV bolus contrast injection. Subsequent parametric perfusion maps were calculated using RAPID software. CONTRAST:  Dose is not yet known on this in progress study COMPARISON:  Preceding head CT. FINDINGS: CTA NECK FINDINGS Aortic arch: Atheromatous plaque.  Three vessel branching. Right carotid system: Mild plaque at the bifurcation without stenosis or ulceration. ICA tortuosity with mild kinking. Left carotid system: No evidence of dissection,  stenosis (50% or greater) or occlusion. Vertebral arteries: No proximal subclavian stenosis. Both vertebral arteries are patent to the dura. Skeleton: Chronic C2 bilateral pedicle fracture with C2-3 and C3-4 anterolisthesis. These findings were also reported on a 2017 cervical spine CT. The anterolisthesis has increased from 2017.  Other neck: No emergent finding Upper chest: Minimal ground-glass density in the right upper lobe which is likely inflammatory. Review of the MIP images confirms the above findings CTA HEAD FINDINGS Anterior circulation: No significant stenosis, proximal occlusion, aneurysm, or vascular malformation. Posterior circulation: No significant stenosis, proximal occlusion, aneurysm, or vascular malformation. Venous sinuses: Non-opacified Anatomic variants: None significant Review of the MIP images confirms the above findings CT Brain Perfusion Findings: ASPECTS: 10 CBF (<30%) Volume: 17mL Perfusion (Tmax>6.0s) volume: 38mL Mismatch Volume: 18mL IMPRESSION: 1. No emergent large vessel occlusion or proximal flow limiting stenosis. 2. Ischemic CT perfusion values in the posterior right hemisphere but no correlative arterial stenosis or occlusion. 3. Mild atherosclerosis. Electronically Signed   By: Monte Fantasia M.D.   On: 01/15/2021 07:10   CT HEAD CODE STROKE WO CONTRAST  Result Date: 01/20/2021 CLINICAL DATA:  Code stroke.  Left-sided paralysis EXAM: CT HEAD WITHOUT CONTRAST TECHNIQUE: Contiguous axial images were obtained from the base of the skull through the vertex without intravenous contrast. COMPARISON:  01/28/2018 FINDINGS: Brain: No evidence of acute infarction, hemorrhage, hydrocephalus, extra-axial collection or mass lesion/mass effect. Generalized atrophy and periventricular chronic small vessel ischemia. Chronic appearing lacune at the posterior left putamen. Vascular: No focal hyperdense vessel. Skull: Normal. Negative for fracture or focal lesion. Sinuses/Orbits: No acute  finding. Other: These results were communicated to Dr Lorrin Goodell at 6:46 amon 06/27/2022by text page via the [facility] messaging system. ASPECTS [facility] Stroke Program Early CT Score) - Ganglionic level infarction (caudate, lentiform nuclei, internal capsule, insula, M1-M3 cortex): 7 - Supraganglionic infarction (M4-M6 cortex): 3 Total score (0-10 with 10 being normal): 10 IMPRESSION: 1. No acute finding. 2. Generalized atrophy and chronic small vessel ischemia. Electronically Signed   By: Monte Fantasia M.D.   On: 01/18/2021 06:47   CT ANGIO HEAD CODE STROKE  Result Date: 01/21/2021 CLINICAL DATA:  Unresponsive. Possible seizure although focally weak on the left with neglect. EXAM: CT ANGIOGRAPHY HEAD AND NECK CT PERFUSION BRAIN TECHNIQUE: Multidetector CT imaging of the head and neck was performed using the standard protocol during bolus administration of intravenous contrast. Multiplanar CT image reconstructions and MIPs were obtained to evaluate the vascular anatomy. Carotid stenosis measurements (when applicable) are obtained utilizing NASCET criteria, using the distal internal carotid diameter as the denominator. Multiphase CT imaging of the brain was performed following IV bolus contrast injection. Subsequent parametric perfusion maps were calculated using RAPID software. CONTRAST:  Dose is not yet known on this in progress study COMPARISON:  Preceding head CT. FINDINGS: CTA NECK FINDINGS Aortic arch: Atheromatous plaque.  Three vessel branching. Right carotid system: Mild plaque at the bifurcation without stenosis or ulceration. ICA tortuosity with mild kinking. Left carotid system: No evidence of dissection, stenosis (50% or greater) or occlusion. Vertebral arteries: No proximal subclavian stenosis. Both vertebral arteries are patent to the dura. Skeleton: Chronic C2 bilateral pedicle fracture with C2-3 and C3-4 anterolisthesis. These findings were also reported on a 2017 cervical spine CT. The  anterolisthesis has increased from 2017. Other neck: No emergent finding Upper chest: Minimal ground-glass density in the right upper lobe which is likely inflammatory. Review of the MIP images confirms the above findings CTA HEAD FINDINGS Anterior circulation: No significant stenosis, proximal occlusion, aneurysm, or vascular malformation. Posterior circulation: No significant stenosis, proximal occlusion, aneurysm, or vascular malformation. Venous sinuses: Non-opacified Anatomic variants: None significant Review of the MIP images confirms the above findings CT Brain Perfusion Findings: ASPECTS: 10 CBF (<30%) Volume: 52mL Perfusion (Tmax>6.0s) volume:  34mL Mismatch Volume: 82mL IMPRESSION: 1. No emergent large vessel occlusion or proximal flow limiting stenosis. 2. Ischemic CT perfusion values in the posterior right hemisphere but no correlative arterial stenosis or occlusion. 3. Mild atherosclerosis. Electronically Signed   By: Monte Fantasia M.D.   On: 01/25/2021 07:10   CT ANGIO NECK CODE STROKE  Result Date: 01/31/2021 CLINICAL DATA:  Unresponsive. Possible seizure although focally weak on the left with neglect. EXAM: CT ANGIOGRAPHY HEAD AND NECK CT PERFUSION BRAIN TECHNIQUE: Multidetector CT imaging of the head and neck was performed using the standard protocol during bolus administration of intravenous contrast. Multiplanar CT image reconstructions and MIPs were obtained to evaluate the vascular anatomy. Carotid stenosis measurements (when applicable) are obtained utilizing NASCET criteria, using the distal internal carotid diameter as the denominator. Multiphase CT imaging of the brain was performed following IV bolus contrast injection. Subsequent parametric perfusion maps were calculated using RAPID software. CONTRAST:  Dose is not yet known on this in progress study COMPARISON:  Preceding head CT. FINDINGS: CTA NECK FINDINGS Aortic arch: Atheromatous plaque.  Three vessel branching. Right carotid  system: Mild plaque at the bifurcation without stenosis or ulceration. ICA tortuosity with mild kinking. Left carotid system: No evidence of dissection, stenosis (50% or greater) or occlusion. Vertebral arteries: No proximal subclavian stenosis. Both vertebral arteries are patent to the dura. Skeleton: Chronic C2 bilateral pedicle fracture with C2-3 and C3-4 anterolisthesis. These findings were also reported on a 2017 cervical spine CT. The anterolisthesis has increased from 2017. Other neck: No emergent finding Upper chest: Minimal ground-glass density in the right upper lobe which is likely inflammatory. Review of the MIP images confirms the above findings CTA HEAD FINDINGS Anterior circulation: No significant stenosis, proximal occlusion, aneurysm, or vascular malformation. Posterior circulation: No significant stenosis, proximal occlusion, aneurysm, or vascular malformation. Venous sinuses: Non-opacified Anatomic variants: None significant Review of the MIP images confirms the above findings CT Brain Perfusion Findings: ASPECTS: 10 CBF (<30%) Volume: 8mL Perfusion (Tmax>6.0s) volume: 70mL Mismatch Volume: 75mL IMPRESSION: 1. No emergent large vessel occlusion or proximal flow limiting stenosis. 2. Ischemic CT perfusion values in the posterior right hemisphere but no correlative arterial stenosis or occlusion. 3. Mild atherosclerosis. Electronically Signed   By: Monte Fantasia M.D.   On: 01/11/2021 07:10    EKG: Independently reviewed.  Sinus tachycardia 103 bpm with QTC 502  Assessment/Plan Recurrent seizures with Todd's paralysis: Patient presents after having generalized tonic-clonic seizure at home.  Noted to have additional seizures in route with EMS and in the emergency department.  Initial CT scan of the brain without any acute abnormalities.  Patient was evaluated by neurology and was loaded with Keppra 1500 mg IV with orders to continue current home regimen.  Patient noted to have recurrent  seizures while in emergency department given additional 2 mg of Ativan.  Concern for patient being in status epilepticus.  Patient has been being followed in outpatient setting and is not clear cause of her acute worsening of symptoms. -Admit to a medical telemetry bed -Seizure precautions -Neuro checks -N.p.o.  -Continuous EEG monitoring -Check MRI of the brain with and without contrast-Continue Keppra and phenobarbital dose IV -PT/OT/speech therapy to evaluate and treat -Appreciate neurology consultative services,  will follow-up for any further recommendations or medication changes  Metabolic acidosis with elevated anion gap: On admission CO2 is 14 with anion gap of 24.  Patient was not suspected to be in DKA with glucose of 206. Question cause of symptoms. -Added  on lactic acid  Hypokalemia: Acute.  On admission potassium noted to be 2.9.  Suspect secondary to nausea and vomiting. -0.45% normal saline IV fluids with 40 mEq of potassium chloride IV and 100 mL/h x 15 hours -Continue to monitor potassium levels and replace as needed  Hypocalcemia: Acute.  Calcium 8.7. -Give 1 g of calcium gluconate IV -Continue to monitor and replace as as needed  Nausea and vomiting: Patient began having nausea and vomiting yesterday evening around 7 PM -Antiemetics as needed  Combined systolic and diastolic CHF: Chronic.  On physical exam lung sounds clear.  Chest x-ray was otherwise noted to be clear last echocardiogram revealed EF 50 to 55% with grade 1 diastolic dysfunction back in 2019. -Strict intake and output  -Daily weights   Underweight: BMI 15.69 kg/m -Add-on TSH check preop.  DVT prophylaxis: heparin Code Status: Full Family Communication: Husband updated at bedside Disposition Plan: To be determined Consults called: Neurology Admission status: Observation  Norval Morton MD [facility]   If 7PM-7AM, please contact night-coverage   01/31/2021, 9:00 AM

## 2021-01-12 NOTE — Progress Notes (Signed)
Neurology Significant Event Note  At 11:45AM pt developed clinical focal seizure w/ L face and arm rhythmic twitching. Initially altered but not unresponsive. Focal seizure activity continued for 1+ hr despite:  - 3mg  total ativan - S/p 1000mg  LEV, gave additional 1000mg  LEV + increased maint to 1000mg  q 12 hrs - Scheduled PHB 64.8 - Fosphenytoin load 20 PE/kg  Hooked up to EEG which confirmed R focal seizure activity. She was lethargic and uncomfortable throughout but able to answer orientation questions correctly and even calculate change. Vitals remained stable. Seizure did not generalize.   # Focal motor status (ongoing) - will treat but not as aggressively as other types of status epilepticus 2/2 low risk of hypoxic injury - Pt transferred to ICU for close monitoring - Continue LEV 1000mg  bid, PHB 64.8mg  bid,  phenytoin per pharmacy consult - cEEG - MRI brain wwo when able to obtain - NPO - Sz precautions  Will continue to follow.   Su Monks, MD [facility] [phone]  If 7pm- 7am, please page neurology on call as listed in [place].

## 2021-01-12 NOTE — ED Notes (Signed)
Attempted to call report

## 2021-01-12 NOTE — Progress Notes (Signed)
Pt transferred to 4N17 with EEG equipment without complications. Pt is back on EEG monitoring

## 2021-01-12 NOTE — Progress Notes (Signed)
vLTM EEG started following spot EEG. Notified Neuro

## 2021-01-12 NOTE — Progress Notes (Addendum)
Subjective: Clinical seizures have resolved since Dilantin load.  Husband at bedside.  ROS: negative except above  Examination  Vital signs in last 24 hours: Temp:  [97.3 F (36.3 C)-99.6 F (37.6 C)] 99.6 F (37.6 C) (06/07 1311) Pulse Rate:  [99-119] 104 (06/07 1530) Resp:  [13-43] 21 (06/07 1530) BP: (136-185)/(78-104) 151/85 (06/07 1530) SpO2:  [91 %-100 %] 100 % (06/07 1530) Weight:  [38.9 kg] 38.9 kg (06/07 0738)  General: lying in bed, not in apparent distress, CVS: pulse-normal rate and rhythm RS: breathing comfortably, CTA B Extremities: normal, warm Neuro: Awake, alert, oriented to place and person, able to follow simple one-step commands, forced right gaze deviation with left visual and spatial hemineglect, 5/5 in right upper and lower extremity, 3/5 in left lower extremity, 0/5 in left upper extremity  Basic Metabolic Panel: Recent Labs  Lab 01/23/2021 0634 01/07/2021 0640 01/24/2021 1055  NA 137 136  --   K 2.9* 2.8*  --   CL 99 102  --   CO2 14*  --   --   GLUCOSE 206* 204*  --   BUN 11 11  --   CREATININE 0.95 0.50  --   CALCIUM 8.7*  --   --   MG  --   --  2.1    CBC: Recent Labs  Lab 01/21/2021 0634 01/29/2021 0640  WBC 9.3  --   NEUTROABS 8.0*  --   HGB 15.2* 15.6*  HCT 45.7 46.0  MCV 104.6*  --   PLT 433*  --      Coagulation Studies: Recent Labs    01/29/2021 0634  LABPROT 13.2  INR 1.0    Imaging CT head without contrast 02/02/2021: No acute abnormality.   ASSESSMENT AND PLAN: 70 year old female who presented with focal convulsive status epilepticus which has improved with Dilantin load but continues to have nonconvulsive status epilepticus on EEG  Focal convulsive status epilepticus -Right facial and upper extremity twitching has resolved since Dilantin load but continues to have nonconvulsive status epilepticus  Recommendations -As patient is awake, alert and oriented, we will continue to treat status epilepticus with gradually adding  additional AEDs and try to avoid intubation -Patient had similar admission previously in 2019 and phenobarbital helped with seizure control at that point.  Therefore, we will load with phenobarbital 300 mg once -If seizures persist, will consider loading with Vimpat -I will also resume patient's home pregabalin 150 mg twice daily, continue Keppra 1000 mg twice daily -If status epilepticus resolves but patient continues to have epileptiform discharges, can consider increasing Keppra dosing -I also discussed with husband at bedside that it she starts having clinical seizures again or has worsening mental status, we would need to proceed with intubation followed by burst suppression with propofol/Versed.  Husband understands and agrees with the plan -Continue LTM EEG while we are adjusting medications -Management of rest of the comorbidities per primary team  CRITICAL CARE Performed by: Lora Havens   Total critical care time: 34minutes  Critical care time was exclusive of separately billable procedures and treating other patients.  Critical care was necessary to treat or prevent imminent or life-threatening deterioration.  Critical care was time spent personally by me on the following activities: development of treatment plan with patient and/or surrogate as well as nursing, discussions with consultants, evaluation of patient's response to treatment, examination of patient, obtaining history from patient or surrogate, ordering and performing treatments and interventions, ordering and review of laboratory studies, ordering and  review of radiographic studies, pulse oximetry and re-evaluation of patient's condition.   Zeb Comfort Epilepsy [facility] For questions after 5pm please refer to AMION to reach the Neurologist on call

## 2021-01-12 NOTE — Procedures (Signed)
Patient Name: Samantha Atkins  MRN: [id number]  Epilepsy Attending: Lora Havens  Referring Physician/Provider: Dr. Su Monks Date: 01/22/2021 Duration: 24.02 minutes  Patient history: 70 year old female presented with focal convulsive status epilepticus.  EEG to evaluate for seizures.  Level of alertness: Awake  AEDs during EEG study: Ativan, Keppra, phenobarbital  Technical aspects: This EEG study was done with scalp electrodes positioned according to the 10-20 International system of electrode placement. Electrical activity was acquired at a sampling rate of 500Hz  and reviewed with a high frequency filter of 70Hz  and a low frequency filter of 1Hz . EEG data were recorded continuously and digitally stored.   Description: Patient was noted to have left face and arm rhythmic twitching. Concomitant EEG showed focal convulsive status epilepticus arising from right centro-temporo-parietal region.  EEG also showed continuous generalized 3 to 5 Hz theta-delta slowing.  Hyperventilation and photic stimulation were not performed.     ABNORMALITY -Focal convulsive status epilepticus,right centro-temporo-parietal region.  -Continuous slow, generalized  IMPRESSION: This study showed evidence of focal convulsive status epilepticus arising from right centro-temporo-parietal region during which patient was noted to have left face and arm rhythmic twitching.  Additionally, there is evidence of moderate to severe diffuse encephalopathy likely related to seizures.   Traevon Meiring [name]

## 2021-01-12 NOTE — Progress Notes (Signed)
STAT EEG completed, results pending

## 2021-01-12 NOTE — Progress Notes (Signed)
PT Cancellation Note  Patient Details Name: Samantha Atkins MRN: [id number] DOB: 12-14-1950   Cancelled Treatment:    Reason Eval/Treat Not Completed: Patient at procedure or test/unavailable (EEG in progress). Will check back as time allows.   Leighton Roach, PT  Acute Rehab Services  Pager [phone] Office [place] 01/15/2021, 2:46 PM

## 2021-01-12 NOTE — H&P (Signed)
NAME:  Samantha Atkins, MRN:  [id number], DOB:  1950-10-29, LOS: 0 ADMISSION DATE:  01/29/2021, CONSULTATION DATE:  01/25/2021 REFERRING MD:  Tamala Julian, CHIEF COMPLAINT:  Recurrent Seizures   History of Present Illness:  Samantha Atkins is a 70 y.o. female with medical history significant for  seizures, anxiety, depression, and ovarian cancer who presented for seizure.  History is obtained from the patient's husband is present at bedside.  Apparently around 7 PM last night patient began to have several episodes of nausea and vomiting throughout the night.  She last threw up around 5 AM.  Her husband had went to go get some water and other things and heard patient knocking over things on the nightstand.  When he went back in the room patient was having a full blown generalized tonic-clonic seizure for which she says that she was foaming at the mouth and not responding.  EMS was called and was witnessed having a 2-minute seizure with them as well and given Versed.  Patient husband notes that she has been having the intermittent episodes that last several minutes with difficulty following commands, changes in speech, weakness, and personality changes over the last month.  Previously had been seizure-free since 2019 prior to seizures restarting on 12/15/2020.  She had been followed by Dr. Delice Lesch who had set her up with 48-hour EEG monitoring and wanted to obtain a MRI of the brain with and without contrast and her Keppra has been increased to 500 mg twice daily recently on 5/20.  ED Course: Upon admission to the emergency department patient was seen as a code stroke.  CT scan of the head without contrast noted no acute abnormalities.  Neurology evaluated the patient and suspected symptoms were related to seizure and findings.  Afebrile, pulse 100-1 19, respirations 19-22, blood pressures 142/86-177/97, and O2 saturation maintained on room air.  Labs significant for hemoglobin 15.2, platelets 433, potassium 2.9, chloride 99, CO2  14, calcium 8.7, glucose 206, and anion gap 24.  Influenza and COVID-19 screening were negative.  CT angiogram of the head and neck with perfusion did not note any emergent large vessel occlusion, but did note ischemic CT perfusion values in the posterior right hemisphere no correlated arterial stenosis or occlusion.  Patient was given Keppra 1500 mg IV and continued on home regimen dose of Keppra and phenobarbital.  TRH called to admit.originally, however EEG revealed active seizures and focal seizures  that did not resolve with Keppra x 2, Ativan 3 mg Phenytoin, and Phenobarbital. Pt is continuing to have L sided seizures. She is awake and currently protecting her airway.  Neurology asked that we admit to the ICU due to difficult to resolve seizures. Plan is for ICU monitoring , and if she decompensates, or mental status changes,  , or is no longer protecting her airway, intubation and seizure suppression.   Pertinent  Medical History   Past Medical History:  Diagnosis Date  . Anxiety   . Arthritis    hands  . Cancer (HCC)    HISTORY OF OVARIAN CANCER  . Depression   . Fracture of humerus, proximal, left, closed 08/07/2015  . Fracture, humerus closed 07-31-15 fell   left  . GERD (gastroesophageal reflux disease)   . Headache   . Insomnia   . Migraines   . Osteoporosis   . S/P right hip fracture 1990   fractures X 2  . Seizures ([place])     Significant Hospital Events: Including procedures, antibiotic start and  stop dates in addition to other pertinent events   . 01/28/2021 Admission to [facility]  . 02/02/2021 New onset seizures  Interim History / Subjective:  Pt. Is currently having L sided clinical seizures. seizures. Per nursing, since she was given ativan 2 mg she has been able to talk and answer questions, but her speech is slurred. She is tired of the seizures. States she feels like this has been going on a long time. She is tired. Na 136 K 2.8 Ionized calcium 0.91 Mag 2.1  Focal  seizure activity ( Confirmed by EEG)  continued for 1+ hr despite:  - 3mg  total ativan - S/p 1000mg  LEV, gave additional 1000mg  LEV + increased maint to 1000mg  q 12 hrs - Scheduled PHB 64.8 - Fosphenytoin load 20 PE/kg   Objective   Blood pressure (!) 170/94, pulse (!) 113, temperature 99.6 F (37.6 C), temperature source Rectal, resp. rate (!) 21, weight 38.9 kg, SpO2 92 %.        Intake/Output Summary (Last 24 hours) at 01/17/2021 1347 Last data filed at 01/11/2021 1320 Gross per 24 hour  Intake 146.39 ml  Output --  Net 146.39 ml   Filed Weights   01/07/2021 0738  Weight: 38.9 kg    Examination: General: Awake and alert, answering questions, but continues to have left sided jerking/ seizures HENT: NCAT, PERRLA, No JVD, No LAD  Lungs: Bilateral chest excursion, Clear throughout and diminished per bases. Cardiovascular: S1, S2, RRR, No RMG, ST per tele Abdomen: Soft and flat, BS +, ND, NT, very thin, Body mass index is 15.69 kg/m. Extremities: No obvious deformities, tin with minimal muscle mass, currently with L arm jerking Neuro: Awake, answering questions,following commands as able GU: Foley Cath  Labs/imaging that I havepersonally reviewed  (right click and "Reselect all SmartList Selections" daily)   Labs significant for hemoglobin 15.2, platelets 433, potassium 2.9, chloride 99, CO2 14, calcium 8.7, glucose 206, and anion gap 24.  Influenza and COVID-19 screening were negative Mag 2.1 Resolved Hospital Problem list     Assessment & Plan:  Recurrent Seizures with Todd's Paralysis Generalized tonic-clonic seizure   Plan Admit to ICU for close monitoring of neuro status Frequent Neuro Checks Call MD for decline in mental status NPO for now Seizure Precautions Continuous EEG monitoring Continue LEV 1000mg  bid, PHB 64.8mg  bid,  phenytoin per pharmacy consult Fosphenytoin load 20 PE/kg>> completed MRI Brain per Neuro when able to obtain Trend Mag and maintain >  2.0 Trend K and maintain > 4.0 Check CK and trend Close monitoring for non-protection of airway  Hypocalcemia. Hypokalemia Suspect 2/2 nausea and vomiting 1 gram calcium gluconate given Plan Trend K and Calcium Replete as needed Zofran/ antiemetics as needed for nausea prn   Combined systolic and diastolic CHF: Chronic Last echo EF 99-37%, grade 1 diastolic dysfunction Plan CXR in am and prn Strict I&O Daily weights Trend BNP  BMI of 15.69 Plan Check TSH>> 1.758 Consider Tube feeds while NPO   Best practice (right click and "Reselect all SmartList Selections" daily)  Diet:  NPO Pain/Anxiety/Delirium protocol (if indicated): No VAP protocol (if indicated): Yes DVT prophylaxis: Subcutaneous Heparin GI prophylaxis: PPI Glucose control:  SSI Yes Central venous access:  N/A Arterial line:  N/A Foley:  N/A Mobility:  bed rest  PT consulted: N/A Last date of multidisciplinary goals of care discussion [pending] Code Status:  full code Disposition: Neuro ICU  Labs   CBC: Recent Labs  Lab 02/03/2021 0634 02/02/2021 0640  WBC 9.3  --   NEUTROABS 8.0*  --   HGB 15.2* 15.6*  HCT 45.7 46.0  MCV 104.6*  --   PLT 433*  --     Basic Metabolic Panel: Recent Labs  Lab 01/25/2021 0634 01/29/2021 0640 02/01/2021 1055  NA 137 136  --   K 2.9* 2.8*  --   CL 99 102  --   CO2 14*  --   --   GLUCOSE 206* 204*  --   BUN 11 11  --   CREATININE 0.95 0.50  --   CALCIUM 8.7*  --   --   MG  --   --  2.1   GFR: Estimated Creatinine Clearance: 40.8 mL/min (by C-G formula based on SCr of 0.5 mg/dL). Recent Labs  Lab 01/11/2021 0634  WBC 9.3    Liver Function Tests: Recent Labs  Lab 01/24/2021 0634  AST 33  ALT 30  ALKPHOS 114  BILITOT 0.4  PROT 7.3  ALBUMIN 3.8   No results for input(s): LIPASE, AMYLASE in the last 168 hours. No results for input(s): AMMONIA in the last 168 hours.  ABG    Component Value Date/Time   PHART 7.573 (H) 02/09/2018 0440   PCO2ART 31.8 (L)  02/09/2018 0440   PO2ART 144 (H) 02/09/2018 0440   HCO3 29.5 (H) 02/09/2018 0440   TCO2 15 (L) 01/31/2021 0640   ACIDBASEDEF 6.0 (H) 01/29/2018 0358   O2SAT 99.5 02/09/2018 0440     Coagulation Profile: Recent Labs  Lab 01/29/2021 0634  INR 1.0    Cardiac Enzymes: No results for input(s): CKTOTAL, CKMB, CKMBINDEX, TROPONINI in the last 168 hours.  HbA1C: No results found for: HGBA1C  CBG: Recent Labs  Lab 01/13/2021 0630  GLUCAP 198*    Review of Systems:   Unable to obtain. See Hx. Present ilness  Past Medical History:  She,  has a past medical history of Anxiety, Arthritis, Cancer ([place]), Depression, Fracture of humerus, proximal, left, closed (08/07/2015), Fracture, humerus closed (07-31-15 fell), GERD (gastroesophageal reflux disease), Headache, Insomnia, Migraines, Osteoporosis, S/P right hip fracture (1990), and Seizures ([place]).   Surgical History:   Past Surgical History:  Procedure Laterality Date  . ABDOMINAL HYSTERECTOMY    . FEMUR FRACTURE SURGERY Left    fell, redo hip replacement  . JOINT REPLACEMENT Bilateral    hip  . ORIF DISTAL RADIUS FRACTURE Left 03-2015  . ORIF HUMERUS FRACTURE Left 08/07/2015   Procedure: LEFT OPEN REDUCTION INTERNAL FIXATION (ORIF) PROXIMAL HUMERUS FRACTURE;  Surgeon: Marchia Bond, MD;  Location: [facility];  Service: Orthopedics;  Laterality: Left;  . TONSILLECTOMY    . TOTAL HIP REVISION Left 02/14/2017   Procedure: LEFT ORIF PERIPROSTHETIC FEMORAL FRACTURE AND REVISION HIP ARTHROPLASTY;  Surgeon: Paralee Cancel, MD;  Location: [facility];  Service: Orthopedics;  Laterality: Left;     Social History:   reports that she has never smoked. She has never used smokeless tobacco. She reports that she does not drink alcohol and does not use drugs.   Family History:  Her family history includes Alcohol abuse in her father; Anxiety disorder in her father; Drug abuse in her father; Healthy in her daughter; Heart Problems in her  father; Hip fracture in her mother; Parkinson's disease in her brother.   Allergies Allergies  Allergen Reactions  . Dust Mite Extract Cough     Home Medications  Prior to Admission medications   Medication Sig Start Date End Date Taking? Authorizing Provider  calcium citrate-vitamin D (CITRACAL+D) 315-200 MG-UNIT tablet Take 1 tablet by mouth 2 (two) times daily.   Yes [provider]  Calcium-Magnesium-Zinc (CAL-MAG-ZINC PO) Take 1 tablet by mouth daily.   Yes [provider]  Cholecalciferol (VITAMIN D-3) 25 MCG (1000 UT) CAPS Take 1,000 Units by mouth daily.   Yes [provider]  eletriptan (RELPAX) 20 MG tablet May repeat in 2 hours if headache persists or recurs. Patient taking differently: Take 20 mg by mouth once as needed for migraine (and may repeat in 2 hours if headache persists or recurs). 08/31/20  Yes Cameron Sprang, MD  ferrous sulfate 325 (65 FE) MG EC tablet Take 1 tablet (325 mg total) by mouth every other day. Patient taking differently: Take 325 mg by mouth daily with breakfast. 02/17/17 01/29/20 Yes Florencia Reasons, MD  FETZIMA 40 MG CP24 Take 1 capsule by mouth daily. Patient taking differently: Take 40 mg by mouth daily. 09/25/20  Yes Plovsky, Berneta Sages, MD  hydrOXYzine (VISTARIL) 25 MG capsule 2  qhs Patient taking differently: Take 50 mg by mouth at bedtime. 09/25/20  Yes Plovsky, Berneta Sages, MD  ibuprofen (ADVIL) 800 MG tablet Take 800 mg by mouth 2 (two) times daily. 12/10/20  Yes [provider]  levETIRAcetam (KEPPRA) 500 MG tablet Take 1 tablet twice a day Patient taking differently: Take 500 mg by mouth 2 (two) times daily. 12/25/20  Yes Cameron Sprang, MD  LORazepam (ATIVAN) 1 MG tablet Take 1 tablet (1 mg total) by mouth every 4 (four) hours as needed for anxiety. 02/13/18  Yes Georgette Shell, MD  Multiple Vitamins-Minerals (CENTRUM SILVER 50+WOMEN) TABS Take 1 tablet by mouth daily with breakfast.   Yes [provider]   PHENobarbital (LUMINAL) 64.8 MG tablet Take 1 tablet (64.8 mg total) by mouth 2 (two) times daily. 08/31/20  Yes Cameron Sprang, MD  potassium chloride SA (K-DUR,KLOR-CON) 20 MEQ tablet Take 20 mEq by mouth daily.   Yes [provider]  pregabalin (LYRICA) 150 MG capsule TAKE 1 CAPSULE BY MOUTH TWICE A DAY Patient taking differently: Take 150 mg by mouth 2 (two) times daily. 09/28/20  Yes Cameron Sprang, MD  PRILOSEC OTC 20 MG tablet Take 20 mg by mouth daily before breakfast.   Yes [provider]  psyllium (HYDROCIL/METAMUCIL) 95 % PACK Take 2 packets by mouth daily.   Yes [provider]  senna (SENOKOT) 8.6 MG tablet Take 1 tablet by mouth in the morning.   Yes [provider]  traMADol (ULTRAM) 50 MG tablet Take 50 mg by mouth every 6 (six) hours as needed (for pain). 12/18/20  Yes [provider]  cholecalciferol (VITAMIN D) 1000 units tablet Take 1,000 Units by mouth daily. Patient not taking: Reported on 01/15/2021    [provider]  feeding supplement (BOOST HIGH PROTEIN) LIQD Take 1 Container by mouth daily. Patient not taking: Reported on 02/03/2021    [provider]  Multiple Vitamin (MULTIVITAMIN WITH MINERALS) TABS tablet Take 1 tablet by mouth daily. Patient not taking: Reported on 01/26/2021    [provider]  omeprazole (PRILOSEC) 20 MG capsule Take 20 mg by mouth daily. Patient not taking: Reported on 01/15/2021    [provider]  Potassium Chloride ER 20 MEQ TBCR Take 10 mEq by mouth daily. Patient not taking: Reported on 01/19/2021 01/06/21   [provider]  senna-docusate (SENOKOT-S) 8.6-50 MG tablet Take 1 tablet by mouth at bedtime. Patient not taking: Reported  on 02/03/2021 02/17/17   Florencia Reasons, MD     Critical care time: 77 minutes    Magdalen Spatz, MSN, AGACNP-BC [place] for personal pager PCCM on call pager [phone] hospital Use  only 01/09/2021 4:00 PM

## 2021-01-12 NOTE — ED Notes (Signed)
Two of Ativan given at [id number]. Verbal order by Dr. Dina Rich

## 2021-01-12 NOTE — Progress Notes (Signed)
Brief Neuro Update:  Have been reviewing cEEG. Patient has been in non convulsive status and now having clinical seizures with R facial twitching and R gaze deviation on my evaluation. She is on Keppra, Dilantin, Phenobarb and Vimpat just given.  On my eval, she has R gaze preference with Left Hemineglect.  We will get PCCM to intubate her and put her in burst suppression on Propofol.   [place] Pager Number [phone]

## 2021-01-12 NOTE — Telephone Encounter (Signed)
Noted  

## 2021-01-12 NOTE — ED Triage Notes (Signed)
Pt BIB EMS for seizures. EMS reports that pt has hx of seizures and has been on seizure meds for 3 years. Family reports that pt has been having breakthrough seizures for a few weeks.  At [facility] reports that husband witnessed a seizure and pt had another seizure when EMS arrived that lasted 2 minutes.  Pt given 5 of Midazolam IM and 2.5 of Midazolam IV by EMS. 20 LAC Vitals with EMS 140/90 HR 108 CBG 242 Pt given 2 of Ativan (by [employer]) when pt arrived to room. Upon arrival to the ED pt had right sided forced gaze, L side facial droop, L arm drift, and L leg drift. Code stroke called

## 2021-01-13 ENCOUNTER — Encounter (HOSPITAL_COMMUNITY): Payer: Self-pay | Admitting: Pulmonary Disease

## 2021-01-13 ENCOUNTER — Inpatient Hospital Stay (HOSPITAL_COMMUNITY): Payer: Managed Care, Other (non HMO)

## 2021-01-13 DIAGNOSIS — D62 Acute posthemorrhagic anemia: Secondary | ICD-10-CM

## 2021-01-13 DIAGNOSIS — K922 Gastrointestinal hemorrhage, unspecified: Secondary | ICD-10-CM

## 2021-01-13 DIAGNOSIS — G40901 Epilepsy, unspecified, not intractable, with status epilepticus: Secondary | ICD-10-CM

## 2021-01-13 DIAGNOSIS — G40909 Epilepsy, unspecified, not intractable, without status epilepticus: Secondary | ICD-10-CM | POA: Diagnosis not present

## 2021-01-13 LAB — URINALYSIS, ROUTINE W REFLEX MICROSCOPIC
Bacteria, UA: NONE SEEN
Bilirubin Urine: NEGATIVE
Glucose, UA: NEGATIVE mg/dL
Ketones, ur: 80 mg/dL — AB
Leukocytes,Ua: NEGATIVE
Nitrite: NEGATIVE
Protein, ur: 30 mg/dL — AB
Specific Gravity, Urine: 1.016 (ref 1.005–1.030)
pH: 6 (ref 5.0–8.0)

## 2021-01-13 LAB — POTASSIUM: Potassium: 4.2 mmol/L (ref 3.5–5.1)

## 2021-01-13 LAB — PHENOBARBITAL LEVEL: Phenobarbital: 19.4 ug/mL (ref 15.0–30.0)

## 2021-01-13 LAB — CBC
HCT: 31.8 % — ABNORMAL LOW (ref 36.0–46.0)
Hemoglobin: 10.6 g/dL — ABNORMAL LOW (ref 12.0–15.0)
MCH: 35.1 pg — ABNORMAL HIGH (ref 26.0–34.0)
MCHC: 33.3 g/dL (ref 30.0–36.0)
MCV: 105.3 fL — ABNORMAL HIGH (ref 80.0–100.0)
Platelets: 271 10*3/uL (ref 150–400)
RBC: 3.02 MIL/uL — ABNORMAL LOW (ref 3.87–5.11)
RDW: 13 % (ref 11.5–15.5)
WBC: 5.9 10*3/uL (ref 4.0–10.5)
nRBC: 0 % (ref 0.0–0.2)

## 2021-01-13 LAB — MAGNESIUM: Magnesium: 1.8 mg/dL (ref 1.7–2.4)

## 2021-01-13 LAB — COMPREHENSIVE METABOLIC PANEL
ALT: 22 U/L (ref 0–44)
AST: 33 U/L (ref 15–41)
Albumin: 3 g/dL — ABNORMAL LOW (ref 3.5–5.0)
Alkaline Phosphatase: 87 U/L (ref 38–126)
Anion gap: 8 (ref 5–15)
BUN: 7 mg/dL — ABNORMAL LOW (ref 8–23)
CO2: 23 mmol/L (ref 22–32)
Calcium: 7.7 mg/dL — ABNORMAL LOW (ref 8.9–10.3)
Chloride: 108 mmol/L (ref 98–111)
Creatinine, Ser: 0.59 mg/dL (ref 0.44–1.00)
GFR, Estimated: >60 mL/min (ref >60)
Glucose, Bld: 77 mg/dL (ref 70–99)
Potassium: 3.8 mmol/L (ref 3.5–5.1)
Sodium: 139 mmol/L (ref 135–145)
Total Bilirubin: 0.5 mg/dL (ref 0.3–1.2)
Total Protein: 5.5 g/dL — ABNORMAL LOW (ref 6.5–8.1)

## 2021-01-13 LAB — CK: Total CK: 702 U/L — ABNORMAL HIGH (ref 38–234)

## 2021-01-13 LAB — BRAIN NATRIURETIC PEPTIDE: B Natriuretic Peptide: 109.4 pg/mL — ABNORMAL HIGH (ref 0.0–100.0)

## 2021-01-13 LAB — RAPID URINE DRUG SCREEN, HOSP PERFORMED
Amphetamines: NOT DETECTED
Barbiturates: POSITIVE — AB
Benzodiazepines: POSITIVE — AB
Cocaine: NOT DETECTED
Opiates: NOT DETECTED
Tetrahydrocannabinol: POSITIVE — AB

## 2021-01-13 LAB — HEMOGLOBIN AND HEMATOCRIT, BLOOD
HCT: 33.4 % — ABNORMAL LOW (ref 36.0–46.0)
Hemoglobin: 11 g/dL — ABNORMAL LOW (ref 12.0–15.0)

## 2021-01-13 LAB — GLUCOSE, CAPILLARY
Glucose-Capillary: 98 mg/dL (ref 70–99)
Glucose-Capillary: 77 mg/dL (ref 70–99)
Glucose-Capillary: 254 mg/dL — ABNORMAL HIGH (ref 70–99)
Glucose-Capillary: 66 mg/dL — ABNORMAL LOW (ref 70–99)
Glucose-Capillary: 101 mg/dL — ABNORMAL HIGH (ref 70–99)
Glucose-Capillary: 79 mg/dL (ref 70–99)
Glucose-Capillary: 83 mg/dL (ref 70–99)

## 2021-01-13 LAB — PHOSPHORUS: Phosphorus: 1.5 mg/dL — ABNORMAL LOW (ref 2.5–4.6)

## 2021-01-13 LAB — TRIGLYCERIDES: Triglycerides: 135 mg/dL (ref <150)

## 2021-01-13 LAB — STREP PNEUMONIAE URINARY ANTIGEN: Strep Pneumo Urinary Antigen: NEGATIVE

## 2021-01-13 LAB — PREALBUMIN: Prealbumin: 21.4 mg/dL (ref 18–38)

## 2021-01-13 MED ORDER — PERAMPANEL 2 MG PO TABS
12.0000 mg | ORAL_TABLET | Freq: Once | ORAL | Status: DC
  Filled 2021-01-13: qty 6

## 2021-01-13 MED ORDER — CALCIUM GLUCONATE-NACL 1-0.675 GM/50ML-% IV SOLN
1.0000 g | Freq: Once | INTRAVENOUS | Status: AC
  Administered 2021-01-13: 1000 mg via INTRAVENOUS
  Filled 2021-01-13: qty 50

## 2021-01-13 MED ORDER — SODIUM CHLORIDE 0.9 % IV SOLN
100.0000 mg | Freq: Two times a day (BID) | INTRAVENOUS | Status: DC
  Administered 2021-01-13 – 2021-01-17 (×10): 100 mg via INTRAVENOUS
  Filled 2021-01-13 (×12): qty 10

## 2021-01-13 MED ORDER — CHLORHEXIDINE GLUCONATE CLOTH 2 % EX PADS
6.0000 | MEDICATED_PAD | Freq: Every day | CUTANEOUS | Status: DC
  Administered 2021-01-14 – 2021-01-27 (×14): 6 via TOPICAL

## 2021-01-13 MED ORDER — SODIUM CHLORIDE 0.9 % IV SOLN
7.5000 mg/kg/h | INTRAVENOUS | Status: DC
  Administered 2021-01-13 (×3): 5 mg/kg/h via INTRAVENOUS
  Administered 2021-01-14: 7.5 mg/kg/h via INTRAVENOUS
  Administered 2021-01-14: 5 mg/kg/h via INTRAVENOUS
  Administered 2021-01-15 – 2021-01-17 (×6): 7.5 mg/kg/h via INTRAVENOUS
  Filled 2021-01-13 (×3): qty 25
  Filled 2021-01-13: qty 5
  Filled 2021-01-13 (×4): qty 25
  Filled 2021-01-13: qty 5
  Filled 2021-01-13 (×4): qty 25

## 2021-01-13 MED ORDER — MIDAZOLAM BOLUS VIA INFUSION
5.0000 mg | Freq: Once | INTRAVENOUS | Status: AC
  Administered 2021-01-13: 5 mg via INTRAVENOUS

## 2021-01-13 MED ORDER — DEXTROSE 50 % IV SOLN: 12.5000 g | Freq: Once | INTRAVENOUS | Status: AC

## 2021-01-13 MED ORDER — SODIUM CHLORIDE 0.9 % IV SOLN
70.0000 mg/h | INTRAVENOUS | Status: DC
  Administered 2021-01-13 (×2): 40 mg/h via INTRAVENOUS
  Filled 2021-01-13 (×4): qty 50

## 2021-01-13 MED ORDER — PHENOBARBITAL SODIUM 130 MG/ML IJ SOLN: 130.0000 mg | Freq: Two times a day (BID) | INTRAMUSCULAR | Status: DC

## 2021-01-13 MED ORDER — PERAMPANEL 2 MG PO TABS
12.0000 mg | ORAL_TABLET | Freq: Once | ORAL | Status: AC
  Administered 2021-01-13: 12 mg

## 2021-01-13 MED ORDER — SODIUM CHLORIDE 0.9 % IV SOLN: 20.0000 mg/h | INTRAVENOUS | Status: DC

## 2021-01-13 MED ORDER — POTASSIUM PHOSPHATES 15 MMOLE/5ML IV SOLN
30.0000 mmol | Freq: Once | INTRAVENOUS | Status: AC
  Administered 2021-01-13: 30 mmol via INTRAVENOUS
  Filled 2021-01-13: qty 10

## 2021-01-13 MED ORDER — MIDAZOLAM 50MG/50ML (1MG/ML) PREMIX INFUSION
20.0000 mg/h | INTRAVENOUS | Status: DC
  Administered 2021-01-13: 5 mg/h via INTRAVENOUS
  Administered 2021-01-13: 3 mg/h via INTRAVENOUS
  Filled 2021-01-13 (×4): qty 50

## 2021-01-13 MED ORDER — SODIUM CHLORIDE 0.9 % IV SOLN
1.0000 mg/kg/h | INTRAVENOUS | Status: DC
  Administered 2021-01-13: 1 mg/kg/h via INTRAVENOUS
  Filled 2021-01-13: qty 5

## 2021-01-13 MED ORDER — SODIUM CHLORIDE 0.9 % IV SOLN
130.0000 mg | Freq: Once | INTRAVENOUS | Status: DC
  Filled 2021-01-13: qty 1

## 2021-01-13 MED ORDER — PERAMPANEL 8 MG PO TABS
12.0000 mg | ORAL_TABLET | Freq: Once | ORAL | Status: DC
  Filled 2021-01-13 (×2): qty 1

## 2021-01-13 MED ORDER — SODIUM CHLORIDE 0.9 % IV SOLN
2000.0000 mg | Freq: Two times a day (BID) | INTRAVENOUS | Status: DC
  Administered 2021-01-13 – 2021-01-18 (×10): 2000 mg via INTRAVENOUS
  Filled 2021-01-13 (×11): qty 20

## 2021-01-13 MED ORDER — SODIUM CHLORIDE 0.9 % IV SOLN
130.0000 mg | Freq: Two times a day (BID) | INTRAVENOUS | Status: DC
  Administered 2021-01-13 – 2021-01-18 (×11): 130 mg via INTRAVENOUS
  Filled 2021-01-13 (×13): qty 1

## 2021-01-13 MED ORDER — SODIUM CHLORIDE 0.9 % IV SOLN
1.5000 mg/kg/h | INTRAVENOUS | Status: DC
  Administered 2021-01-13: 1.5 mg/kg/h via INTRAVENOUS

## 2021-01-13 MED ORDER — SODIUM CHLORIDE 0.9 % IV SOLN
2.0000 mg/kg/h | INTRAVENOUS | Status: DC
  Filled 2021-01-13: qty 5

## 2021-01-13 MED ORDER — MAGNESIUM SULFATE 4 GM/100ML IV SOLN
4.0000 g | Freq: Once | INTRAVENOUS | Status: AC
  Administered 2021-01-13: 4 g via INTRAVENOUS
  Filled 2021-01-13: qty 100

## 2021-01-13 MED ORDER — SODIUM CHLORIDE 0.9 % IV SOLN
50.0000 mg/h | INTRAVENOUS | Status: DC
  Administered 2021-01-13: 70 mg/h via INTRAVENOUS
  Administered 2021-01-14: 100 mg/h via INTRAVENOUS
  Administered 2021-01-14: 90 mg/h via INTRAVENOUS
  Administered 2021-01-14 – 2021-01-17 (×12): 100 mg/h via INTRAVENOUS
  Administered 2021-01-17: 50 mg/h via INTRAVENOUS
  Administered 2021-01-17: 100 mg/h via INTRAVENOUS
  Administered 2021-01-18: 50 mg/h via INTRAVENOUS
  Filled 2021-01-13 (×22): qty 100

## 2021-01-13 MED ORDER — DEXTROSE 50 % IV SOLN
INTRAVENOUS | Status: AC
  Administered 2021-01-13: 12.5 g via INTRAVENOUS
  Filled 2021-01-13: qty 50

## 2021-01-13 MED ORDER — PANTOPRAZOLE SODIUM 40 MG IV SOLR
40.0000 mg | Freq: Two times a day (BID) | INTRAVENOUS | Status: DC
  Administered 2021-01-13 – 2021-01-15 (×5): 40 mg via INTRAVENOUS
  Filled 2021-01-13 (×5): qty 40

## 2021-01-13 NOTE — Progress Notes (Signed)
maint complete.  

## 2021-01-13 NOTE — Progress Notes (Signed)
Subjective: Clinical seizures recorded overnight and eventually patient was intubated, propofol and Versed was started.  Unfortunately, seizures have been refractory.  Husband at bedside this morning states she had a similar presentation in 2019 when she was in the hospital for almost a week  ROS: Unable to obtain due to poor mental status  Examination  Vital signs in last 24 hours: Temp:  [97.7 F (36.5 C)-100.5 F (38.1 C)] 97.7 F (36.5 C) (06/08 1200) Pulse Rate:  [74-118] 74 (06/08 0741) Resp:  [13-32] 15 (06/08 0741) BP: (103-197)/(58-125) 126/76 (06/08 0730) SpO2:  [91 %-100 %] 100 % (06/08 0730) FiO2 (%):  [40 %-60 %] 40 % (06/08 1103) Weight:  [39.6 kg] 39.6 kg (06/08 0500)  General: lying in bed, not in apparent distress CVS: pulse-normal rate and rhythm RS: Intubated, CTAB Extremities: normal, warm Neuro: Comatose, does not open eyes to noxious stimuli, PERRLA, corneal reflex absent, gag reflex absent, does not withdraw to noxious stimuli in any extremity  Basic Metabolic Panel: Recent Labs  Lab 02/04/2021 0634 01/28/2021 0640 01/11/2021 1055 01/06/2021 1426 01/22/2021 2326 01/29/2021 2327 01/13/21 0524  NA 137 136  --   --  137  --  139  K 2.9* 2.8*  --   --  4.2 4.2 3.8  CL 99 102  --   --   --   --  108  CO2 14*  --   --   --   --   --  23  GLUCOSE 206* 204*  --   --   --   --  77  BUN 11 11  --   --   --   --  7*  CREATININE 0.95 0.50  --   --   --   --  0.59  CALCIUM 8.7*  --   --   --   --   --  7.7*  MG  --   --  2.1  --   --   --  1.8  PHOS  --   --   --  1.9*  --   --  1.5*    CBC: Recent Labs  Lab 01/09/2021 0634 02/01/2021 0640 01/16/2021 2326 01/13/21 0733  WBC 9.3  --   --  5.9  NEUTROABS 8.0*  --   --   --   HGB 15.2* 15.6* 10.9* 10.6*  HCT 45.7 46.0 32.0* 31.8*  MCV 104.6*  --   --  105.3*  PLT 433*  --   --  271     Coagulation Studies: Recent Labs    01/17/2021 0634  LABPROT 13.2  INR 1.0    Imaging CT head without contrast 01/23/2021: No  acute abnormality CTA head and neck 01/30/2021: No large vessel occlusion CT perfusion 01/06/2021: Ischemic CT perfusion in posterior right hemisphere  ASSESSMENT AND PLAN: 70 year old female who presented with focal convulsive status epilepticus which has improved with Dilantin load but continues to have nonconvulsive status epilepticus on EEG  Focal status epilepticus, refractory -Patient's seizures initially improved after Dilantin.  However started having facial twitching again yesterday evening and eventually was intubated and sedated.  However even with propofol and Versed, patient continues to have seizures in right centro- temporo-parietal region -Unclear etiology of status epilepticus, no evidence of infection so far (UA clean, no growth on blood cultures, chest x-ray clear)  Recommendations -Patient already on propofol and Versed and still seizing.  Also becoming hypotensive -Therefore, will transition to ketamine and Versed, titrate to  burst suppression -We will also load with perampanel 12 mg once -We will increase Keppra to 2000 mg twice daily, increase phenobarbital to 130 mg twice daily, continue lacosamide 100 mg twice daily, continue Dilantin 150 mg twice daily -Phenytoin and phenobarbital level ordered and pending -Pregabalin was ordered but patient had coffee-ground emesis this morning, therefore few medications have been held.  Will resume when possible -Once seizures improved, will plan for MRI brain with and without contrast and possibly a lumbar puncture to look for autoimmune, neoplastic-paraneoplastic etiologies - Continue LTM EEG while we are adjusting medications -Management of rest of the comorbidities per primary team  CRITICAL CARE Performed by: Lora Havens   Total critical care time: 79minutes  Critical care time was exclusive of separately billable procedures and treating other patients.  Critical care was necessary to treat or prevent imminent or  life-threatening deterioration.  Critical care was time spent personally by me on the following activities: development of treatment plan with patient and/or surrogate as well as nursing, discussions with consultants, evaluation of patient's response to treatment, examination of patient, obtaining history from patient or surrogate, ordering and performing treatments and interventions, ordering and review of laboratory studies, ordering and review of radiographic studies, pulse oximetry and re-evaluation of patient's condition.     Zeb Comfort Epilepsy [facility] For questions after 5pm please refer to AMION to reach the Neurologist on call

## 2021-01-13 NOTE — Progress Notes (Signed)
OGT with coffee ground output. CCM aware, CBC noted. Tube meds held per MD. Tube feeds also deferred for today, OG remains to LIS. MAY give perampanel via tube today per Dr. Hortense Ramal and Dr. Tacy Learn.

## 2021-01-13 NOTE — Progress Notes (Signed)
PT Cancellation Note  Patient Details Name: Samantha Atkins MRN: [id number] DOB: Oct 08, 1950   Cancelled Treatment:    Reason Eval/Treat Not Completed: Patient not medically ready (continuous EEG/ multiple sedation medications / intubated)  Wyona Almas, PT, DPT [place] Pager [phone] Office [phone]    Deno Etienne 01/13/2021, 7:44 AM

## 2021-01-13 NOTE — Progress Notes (Signed)
SLP Cancellation Note  Patient Details Name: Samantha Atkins MRN: [id number] DOB: 02-Sep-1950   Cancelled treatment:       Reason Eval/Treat Not Completed: Patient not medically ready, intubated    Shakeel Disney, Katherene Ponto 01/13/2021, 8:55 AM

## 2021-01-13 NOTE — Progress Notes (Signed)
[facility] notified tech the study was off line.  Study is now on line, confirm with [facility] event button pushed.

## 2021-01-13 NOTE — Progress Notes (Signed)
OT Cancellation Note  Patient Details Name: Samantha Atkins MRN: [id number] DOB: 05/28/1951   Cancelled Treatment:    Reason Eval/Treat Not Completed: Patient not medically ready (continuous EEG/ multiple sedation medications / intubated) Ot to attempt at a more appropriate time.   Billey Chang, OTR/L  [facility] Pager: [phone] Office: [phone] .  01/13/2021, 7:16 AM

## 2021-01-13 NOTE — Progress Notes (Signed)
August Progress Note Patient Name: Samantha Atkins DOB: 1951/05/26 MRN: [id number]   Date of Service  01/13/2021  HPI/Events of Note  Patient is having breakthrough seizures on maximum Propofol infusion. She needs a Foley catheter.  eICU Interventions  New Patient Evaluation. Foley catheter ordered. Versed infusion ordered to be added to Propofol for the purpose of achieving burst suppression.        Kerry Kass Tauri Ethington 01/13/2021, 12:05 AM

## 2021-01-13 NOTE — Procedures (Signed)
Patient Name: Samantha Atkins  MRN: [id number]  Epilepsy Attending: Lora Havens  Referring Physician/Provider: Dr. Su Monks Duration: 02/04/2021 1347 to 01/13/2021 1347  Patient history: 70 year old female presented with focal convulsive status epilepticus.  EEG to evaluate for seizures.  Level of alertness: Awake, asleep/sedated  AEDs during EEG study: Ativan, Keppra, phenobarbital, PHT, LCM, Propofol, versed  Technical aspects: This EEG study was done with scalp electrodes positioned according to the 10-20 International system of electrode placement. Electrical activity was acquired at a sampling rate of 500Hz  and reviewed with a high frequency filter of 70Hz  and a low frequency filter of 1Hz . EEG data were recorded continuously and digitally stored.   Description: Patient was initially noted to have left face and arm rhythmic twitching. Concomitant EEG showed focal convulsive status epilepticus arising from right centro-temporo-parietal region. EEG also showed continuous generalized 3 to 5 Hz theta-delta slowing.  Multiple AEDs were added after which clinical seizures stopped.  However there was no significant change in EEG. Eventually propofol was started around 2130 after which EEG showed brief 1 to 3 seconds of generalized EEG attenuation.  As sedation was titrated, the periods of EEG attenuation became longer lasting around 2 to 5 seconds. EEG continued to show highly epileptiform bursts in right centro- temporo-parietal region as well as polymorphic mixed frequencies with predominantly 5 to 8 Hz theta-alpha activity.  ABNORMALITY -Focal non-convulsive status epilepticus,right centro-temporo-parietal region.  -Burst suppression with highly epileptiform bursts, ,right centro-temporo-parietal region.   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 Additionally, there is severe to profound diffuse  encephalopathy likely related to seizures, sedation.   [name]

## 2021-01-13 NOTE — Progress Notes (Addendum)
NAME:  Samantha Atkins, MRN:  [id number], DOB:  1951/03/11, LOS: 1 ADMISSION DATE:  02/03/2021, CONSULTATION DATE:  01/15/2021 REFERRING MD:  Tamala Julian, CHIEF COMPLAINT:  Recurrent Seizures   History of Present Illness:  Samantha Atkins is a 70 y.o. female with medical history significant for  seizures, anxiety, depression, and ovarian cancer who presented for seizure.  History is obtained from the patient's husband is present at bedside.  Apparently around 7 PM last night patient began to have several episodes of nausea and vomiting throughout the night.  She last threw up around 5 AM.  Her husband had went to go get some water and other things and heard patient knocking over things on the nightstand.  When he went back in the room patient was having a full blown generalized tonic-clonic seizure for which she says that she was foaming at the mouth and not responding.  EMS was called and was witnessed having a 2-minute seizure with them as well and given Versed.  Patient husband notes that she has been having the intermittent episodes that last several minutes with difficulty following commands, changes in speech, weakness, and personality changes over the last month.  Previously had been seizure-free since 2019 prior to seizures restarting on 12/15/2020.  She had been followed by Dr. Delice Lesch who had set her up with 48-hour EEG monitoring and wanted to obtain a MRI of the brain with and without contrast and her Keppra has been increased to 500 mg twice daily recently on 5/20.  ED Course: Upon admission to the emergency department patient was seen as a code stroke.  CT scan of the head without contrast noted no acute abnormalities.  Neurology evaluated the patient and suspected symptoms were related to seizure and findings.  Afebrile, pulse 100-1 19, respirations 19-22, blood pressures 142/86-177/97, and O2 saturation maintained on room air.  Labs significant for hemoglobin 15.2, platelets 433, potassium 2.9, chloride 99, CO2  14, calcium 8.7, glucose 206, and anion gap 24.  Influenza and COVID-19 screening were negative.  CT angiogram of the head and neck with perfusion did not note any emergent large vessel occlusion, but did note ischemic CT perfusion values in the posterior right hemisphere no correlated arterial stenosis or occlusion.  Patient was given Keppra 1500 mg IV and continued on home regimen dose of Keppra and phenobarbital.  TRH called to admit.originally, however EEG revealed active seizures and focal seizures  that did not resolve with Keppra x 2, Ativan 3 mg Phenytoin, and Phenobarbital. Pt is continuing to have L sided seizures. She is awake and currently protecting her airway.  Neurology asked that we admit to the ICU due to difficult to resolve seizures. Plan is for ICU monitoring , and if she decompensates, or mental status changes,  , or is no longer protecting her airway, intubation and seizure suppression.   Significant Hospital Events: Including procedures, antibiotic start and stop dates in addition to other pertinent events   . 01/26/2021 Admission to [facility]  . 01/30/2021 status epilepticus . 6/8 ETT  Interim History / Subjective:  Patient remained in status epilepticus, requiring propofol and Versed infusion.  She was intubated overnight for airway protection considering she was requiring high amount of sedation to achieve burst suppression This morning EEG showed continuous seizures, Versed was increased and she was continued on propofol, Keppra, Vimpat, phenobarbital and phenytoin  Objective   Blood pressure 126/76, pulse 74, temperature 98.7 F (37.1 C), temperature source Axillary, resp. rate 15, height  5\' 3"  (1.6 m), weight 39.6 kg, SpO2 100 %.    Vent Mode: PRVC FiO2 (%):  [40 %-60 %] 40 % Set Rate:  [15 bmp] 15 bmp Vt Set:  [420 mL] 420 mL PEEP:  [5 cmH20] 5 cmH20 Plateau Pressure:  [12 cmH20-13 cmH20] 13 cmH20   Intake/Output Summary (Last 24 hours) at 01/13/2021 0939 Last data filed at  01/13/2021 0700 Gross per 24 hour  Intake 2824.06 ml  Output 650 ml  Net 2174.06 ml   Filed Weights   01/24/2021 0738 01/13/21 0500  Weight: 38.9 kg 39.6 kg    Examination: General: Critically ill-appearing elderly Caucasian female, lying in the bed, orally intubated  HENT: NCAT, PERRLA, No JVD, No LAD, ETT and OGT in place Lungs: Bilateral chest excursion, Clear throughout and diminished per bases. Cardiovascular: S1, S2, RRR, No RMG, ST per tele Abdomen: Soft and flat, BS +, ND, NT, very thin,  Extremities: No obvious deformities, tin with minimal muscle mass, currently with L arm jerking Neuro: Sedated, not following commands Skin: No rash  Labs/imaging that I havepersonally reviewed  (right click and "Reselect all SmartList Selections" daily)   Labs significant for hemoglobin 15.2, platelets 433, sodium 139 potassium 3.9, CO2 23, calcium 7.7, Mag 1.8, Phos 1.5 Resolved Hospital Problem list     Assessment & Plan:  Status epilepticus Continue neuro check Patient continued to have seizures based on continuous EEG despite being on multiple antiepileptic meds including Keppra, phenytoin, Vimpat, phenobarbital propofol and Versed infusion Neurology is following NPO for now Seizure Precautions Continuous EEG monitoring MRI Brain per Neuro when able to obtain Trend Mag and maintain > 2.0 Trend K and maintain > 4.0 Titrate Versed and propofol to achieve burst suppression  Acute respiratory insufficiency due to status epilepticus requiring deep IV sedation Continue lung protective ventilation SAT and SBT as tolerated  Hypocalcemia, Hypokalemia, hypomagnesemia and hypophosphatemia Continue supplement electrolyte and monitor  Acute upper GI bleeding Acute blood loss anemia Patient is having coffee-ground emesis Her hemoglobin is down from 14 on admission to 10.6 Continue Protonix 40 mg twice daily IV Monitor H&H every 8 hours  Chronic HFpEF No signs of volume  overload Monitor intake and output and daily weight  Cachexia Nutritionist following Continue tube feeds and supplement   Best practice (right click and "Reselect all SmartList Selections" daily)  Diet:  NPO tube feeds Pain/Anxiety/Delirium protocol (if indicated):  VAP protocol (if indicated): Yes DVT prophylaxis: Subcutaneous Heparin GI prophylaxis: PPI Glucose control:  SSI Yes Central venous access: Yes, needed Arterial line:  N/A Foley:  N/A Mobility:  bed rest  PT consulted: N/A Last date of multidisciplinary goals of care discussion [pending] Code Status:  full code Disposition: ICU  Labs   CBC: Recent Labs  Lab 01/18/2021 0634 01/06/2021 0640 01/31/2021 2326 01/13/21 0733  WBC 9.3  --   --  5.9  NEUTROABS 8.0*  --   --   --   HGB 15.2* 15.6* 10.9* 10.6*  HCT 45.7 46.0 32.0* 31.8*  MCV 104.6*  --   --  105.3*  PLT 433*  --   --  811    Basic Metabolic Panel: Recent Labs  Lab 01/23/2021 0634 01/24/2021 0640 01/24/2021 1055 01/17/2021 1426 01/28/2021 2326 01/09/2021 2327 01/13/21 0524  NA 137 136  --   --  137  --  139  K 2.9* 2.8*  --   --  4.2 4.2 3.8  CL 99 102  --   --   --   --  108  CO2 14*  --   --   --   --   --  23  GLUCOSE 206* 204*  --   --   --   --  77  BUN 11 11  --   --   --   --  7*  CREATININE 0.95 0.50  --   --   --   --  0.59  CALCIUM 8.7*  --   --   --   --   --  7.7*  MG  --   --  2.1  --   --   --  1.8  PHOS  --   --   --  1.9*  --   --  1.5*   GFR: Estimated Creatinine Clearance: 41.5 mL/min (by C-G formula based on SCr of 0.59 mg/dL). Recent Labs  Lab 01/20/2021 0634 01/14/2021 1426 01/10/2021 1438 01/13/21 0733  PROCALCITON  --  3.37  --   --   WBC 9.3  --   --  5.9  LATICACIDVEN  --   --  1.6  --     Liver Function Tests: Recent Labs  Lab 01/30/2021 0634 01/13/21 0524  AST 33 33  ALT 30 22  ALKPHOS 114 87  BILITOT 0.4 0.5  PROT 7.3 5.5*  ALBUMIN 3.8 3.0*   No results for input(s): LIPASE, AMYLASE in the last 168 hours. No  results for input(s): AMMONIA in the last 168 hours.  ABG    Component Value Date/Time   PHART 7.380 01/11/2021 2326   PCO2ART 36.0 01/20/2021 2326   PO2ART 250 (H) 01/17/2021 2326   HCO3 21.4 01/29/2021 2326   TCO2 22 01/11/2021 2326   ACIDBASEDEF 3.0 (H) 01/09/2021 2326   O2SAT 100.0 01/11/2021 2326     Coagulation Profile: Recent Labs  Lab 01/19/2021 0634  INR 1.0    Cardiac Enzymes: Recent Labs  Lab 01/13/21 0524  CKTOTAL 702*    HbA1C: No results found for: HGBA1C  CBG: Recent Labs  Lab 02/02/2021 0630 01/25/2021 2311 01/13/21 0315 01/13/21 0724  GLUCAP 198* 97 98 77    Total critical care time: 52 minutes  Performed by: [place] care time was exclusive of separately billable procedures and treating other patients.   Critical care was necessary to treat or prevent imminent or life-threatening deterioration.   Critical care was time spent personally by me on the following activities: development of treatment plan with patient and/or surrogate as well as nursing, discussions with consultants, evaluation of patient's response to treatment, examination of patient, obtaining history from patient or surrogate, ordering and performing treatments and interventions, ordering and review of laboratory studies, ordering and review of radiographic studies, pulse oximetry and re-evaluation of patient's condition.   Jacky Kindle MD [facility] See Amion for pager If no response to pager, please call [phone] until 7pm After 7pm, Please call E-link [phone]

## 2021-01-13 NOTE — Progress Notes (Signed)
Initial Nutrition Assessment  DOCUMENTATION CODES:   Underweight,Severe malnutrition in context of chronic illness  INTERVENTION:   - Discussed pt with MD; plan is to hold off on starting tube feeds at this time due to coffee-ground output from OGT  When medically able, recommend initiation of tube feeds via OGT: - Recommend staring Vital AF 1.2 @ 20 ml/hr and advancing by 10 ml q 8 hours to goal rate of 50 ml/hr (1200 ml/day)  Recommended tube feeding regimen at goal provides 1440 kcal, 90 grams of protein, and 973 ml of H2O.  Once enteral nutrition started, monitor magnesium, potassium, and phosphorus daily for at least 3 days, MD to replete as needed, as pt is at risk for refeeding syndrome given severe malnutrition.  - Recommend adding MVI with minerals daily  NUTRITION DIAGNOSIS:   Severe Malnutrition related to chronic illness (anxiety, depression) as evidenced by severe muscle depletion,severe fat depletion.  GOAL:   Patient will meet greater than or equal to 90% of their needs  MONITOR:   Vent status,Labs,Weight trends,I & O's  REASON FOR ASSESSMENT:   Ventilator,Consult Enteral/tube feeding initiation and management,Assessment of nutrition requirement/status  ASSESSMENT:   70 year old female who presented to the ED on 6/07 with seizures and stroke-like symptoms. PMH of seizures, GERD, anxiety, depression, insomnia, migraines, and ovarian cancer. Pt admitted with recurrent seizures with Todd's paralysis.   6/07 - intubated, burst suppression  Consult received for tube feeding initiation and management. Pt with OG tube in place, currently to low intermittent suction. Discussed pt with RN who reports pt with coffee-ground output from OG tube. Discussed with MD who would like to hold off on starting tube feeds today. RD to leave tube feeding recommendations.  Spoke with pt's husband at bedside. Pt's husband provided diet and weight history for pt. Pt's husband reports  pt with a "long history of anorexia" but that this has not been formally diagnosed. He believes that pt has been dealing with an eating disorder for many years based on her family history and pt's mental health history as well as pt's eating behaviors. Pt's husband reports pt has had severe depression since he has known her.  Pt's husband shares that at baseline, pt does not eat much even on a "good day." Pt's husband shares that pt will often go "weeks at a time" only consuming wheat tortillas and Luna bars. Pt has recently started adding bagged salads to her diet.  Pt's husband reports that pt takes many supplements including iron, MVI, calcium, magnesium/zinc/calium, and vitamin D. He reports pt has "severe osteoporosis" and that her bones are "as brittle as talc." Pt's husband reports that pt typically ambulates with a walker but spends a lot of time in bed. Most recently, pt's husband has felt uncomfortable with pt using walker so he has been having her use a wheelchair.  Pt's husband reports that when he and pt first met about 20 years ago, she weighed 110 lbs. Pt's husband states that pt's weight loss started about 9 years ago after she was bedbound after a fall and fracture. He reports that lately she has been weighing in the upper 80 lb range.  Reviewed weight history in chart. Over the last 2 years, pt's weight has fluctuated back and forth between 36.7-40.8 kg with no real trends noted.  Patient is currently intubated on ventilator support MV: 5.9 L/min Temp (24hrs), Avg:99.1 F (37.3 C), Min:97.7 F (36.5 C), Max:100.5 F (38.1 C)  Drips: Versed Ketamine NS: 10  ml/hr  Medications reviewed and include: colace, IV protonix, miralax, IV keppra, IV vimpat, IV magnesium sulfate 4 grams once, IV dilantin, IV phenobarbital, IV potassium phosphate 30 mmol once  Labs reviewed: phosphorus 1.5 CBG's: 66-254 x 24 hours  UOP: 500 ml x 12 hours OGT: 150 ml x 12 hours I/O's: +2.1 L since  admit  NUTRITION - FOCUSED PHYSICAL EXAM:  Flowsheet Row Most Recent Value  Orbital Region Severe depletion  Upper Arm Region Severe depletion  Thoracic and Lumbar Region Severe depletion  Buccal Region Unable to assess  Temple Region Moderate depletion  Clavicle Bone Region Severe depletion  Clavicle and Acromion Bone Region Severe depletion  Scapular Bone Region Severe depletion  Dorsal Hand Severe depletion  Patellar Region Severe depletion  Anterior Thigh Region Severe depletion  Posterior Calf Region Severe depletion  Edema (RD Assessment) None  Hair Reviewed  Eyes Unable to assess  Mouth Unable to assess  Skin Reviewed  Nails Reviewed       Diet Order:   Diet Order            Diet NPO time specified  Diet effective now                 EDUCATION NEEDS:   No education needs have been identified at this time  Skin:  Skin Assessment: Reviewed RN Assessment  Last BM:  no documented BM  Height:   Ht Readings from Last 1 Encounters:  01/13/21 _0  (1.6 m)    Weight:   Wt Readings from Last 1 Encounters:  01/13/21 39.6 kg    BMI:  Body mass index is 15.46 kg/m.  Estimated Nutritional Needs:   Kcal:  1300-1500  Protein:  65-85 grams  Fluid:  >/= 1.5 L/day    Gustavus Bryant, MS, RD, LDN Inpatient Clinical Dietitian Please see AMiON for contact information.

## 2021-01-14 ENCOUNTER — Inpatient Hospital Stay (HOSPITAL_COMMUNITY): Payer: Managed Care, Other (non HMO)

## 2021-01-14 DIAGNOSIS — G40909 Epilepsy, unspecified, not intractable, without status epilepticus: Secondary | ICD-10-CM | POA: Diagnosis not present

## 2021-01-14 DIAGNOSIS — R569 Unspecified convulsions: Secondary | ICD-10-CM

## 2021-01-14 DIAGNOSIS — J9601 Acute respiratory failure with hypoxia: Secondary | ICD-10-CM

## 2021-01-14 LAB — COMPREHENSIVE METABOLIC PANEL
ALT: 19 U/L (ref 0–44)
AST: 23 U/L (ref 15–41)
Albumin: 2.5 g/dL — ABNORMAL LOW (ref 3.5–5.0)
Alkaline Phosphatase: 96 U/L (ref 38–126)
Anion gap: 12 (ref 5–15)
BUN: <5 mg/dL — ABNORMAL LOW (ref 8–23)
CO2: 17 mmol/L — ABNORMAL LOW (ref 22–32)
Calcium: 6.8 mg/dL — ABNORMAL LOW (ref 8.9–10.3)
Chloride: 110 mmol/L (ref 98–111)
Creatinine, Ser: 0.51 mg/dL (ref 0.44–1.00)
GFR, Estimated: >60 mL/min (ref >60)
Glucose, Bld: 54 mg/dL — ABNORMAL LOW (ref 70–99)
Potassium: 3.5 mmol/L (ref 3.5–5.1)
Sodium: 139 mmol/L (ref 135–145)
Total Bilirubin: 0.7 mg/dL (ref 0.3–1.2)
Total Protein: 4.7 g/dL — ABNORMAL LOW (ref 6.5–8.1)

## 2021-01-14 LAB — PROTEIN AND GLUCOSE, CSF
Glucose, CSF: 44 mg/dL (ref 40–70)
Total  Protein, CSF: 30 mg/dL (ref 15–45)

## 2021-01-14 LAB — CSF CELL COUNT WITH DIFFERENTIAL
RBC Count, CSF: 1700 /mm3 — ABNORMAL HIGH
RBC Count, CSF: 2725 /mm3 — ABNORMAL HIGH
Tube #: 1
Tube #: 4
WBC, CSF: 1 /mm3 (ref 0–5)
WBC, CSF: 2 /mm3 (ref 0–5)

## 2021-01-14 LAB — CALCIUM, IONIZED: Calcium, Ionized, Serum: 4.4 mg/dL — ABNORMAL LOW (ref 4.5–5.6)

## 2021-01-14 LAB — GLUCOSE, CAPILLARY
Glucose-Capillary: 77 mg/dL (ref 70–99)
Glucose-Capillary: 62 mg/dL — ABNORMAL LOW (ref 70–99)
Glucose-Capillary: 98 mg/dL (ref 70–99)
Glucose-Capillary: 122 mg/dL — ABNORMAL HIGH (ref 70–99)
Glucose-Capillary: 133 mg/dL — ABNORMAL HIGH (ref 70–99)

## 2021-01-14 LAB — CBC
HCT: 30.7 % — ABNORMAL LOW (ref 36.0–46.0)
Hemoglobin: 10.1 g/dL — ABNORMAL LOW (ref 12.0–15.0)
MCH: 35.1 pg — ABNORMAL HIGH (ref 26.0–34.0)
MCHC: 32.9 g/dL (ref 30.0–36.0)
MCV: 106.6 fL — ABNORMAL HIGH (ref 80.0–100.0)
Platelets: 244 10*3/uL (ref 150–400)
RBC: 2.88 MIL/uL — ABNORMAL LOW (ref 3.87–5.11)
RDW: 13.1 % (ref 11.5–15.5)
WBC: 4.2 10*3/uL (ref 4.0–10.5)
nRBC: 0 % (ref 0.0–0.2)

## 2021-01-14 LAB — HEMOGLOBIN AND HEMATOCRIT, BLOOD
HCT: 32.6 % — ABNORMAL LOW (ref 36.0–46.0)
HCT: 33.7 % — ABNORMAL LOW (ref 36.0–46.0)
Hemoglobin: 10.6 g/dL — ABNORMAL LOW (ref 12.0–15.0)
Hemoglobin: 10.9 g/dL — ABNORMAL LOW (ref 12.0–15.0)

## 2021-01-14 LAB — LEGIONELLA PNEUMOPHILA SEROGP 1 UR AG: L. pneumophila Serogp 1 Ur Ag: NEGATIVE

## 2021-01-14 LAB — MAGNESIUM
Magnesium: 1.8 mg/dL (ref 1.7–2.4)
Magnesium: 1.6 mg/dL — ABNORMAL LOW (ref 1.7–2.4)

## 2021-01-14 LAB — PHOSPHORUS
Phosphorus: 2 mg/dL — ABNORMAL LOW (ref 2.5–4.6)
Phosphorus: 2.9 mg/dL (ref 2.5–4.6)

## 2021-01-14 MED ORDER — KETAMINE BOLUS VIA INFUSION
0.2500 mg/kg | Freq: Once | INTRAVENOUS | Status: AC
  Administered 2021-01-14: 10 mg via INTRAVENOUS

## 2021-01-14 MED ORDER — SODIUM CHLORIDE 0.9 % IV SOLN
1000.0000 mg | INTRAVENOUS | Status: AC
  Administered 2021-01-14 – 2021-01-18 (×5): 1000 mg via INTRAVENOUS
  Filled 2021-01-14 (×5): qty 8

## 2021-01-14 MED ORDER — KETAMINE BOLUS VIA INFUSION
0.5000 mg/kg | Freq: Once | INTRAVENOUS | Status: AC
  Administered 2021-01-14: 19.8 mg via INTRAVENOUS

## 2021-01-14 MED ORDER — MIDAZOLAM BOLUS VIA INFUSION
10.0000 mg | Freq: Once | INTRAVENOUS | Status: AC
  Administered 2021-01-14: 10 mg via INTRAVENOUS

## 2021-01-14 MED ORDER — LORAZEPAM 2 MG/ML IJ SOLN
2.0000 mg | Freq: Once | INTRAMUSCULAR | Status: AC
  Administered 2021-01-14: 2 mg via INTRAVENOUS

## 2021-01-14 MED ORDER — MIDAZOLAM BOLUS VIA INFUSION
10.0000 mg | Freq: Once | INTRAVENOUS | Status: AC
  Administered 2021-01-14: 10 mg via INTRAVENOUS
  Filled 2021-01-14: qty 10

## 2021-01-14 MED ORDER — POTASSIUM PHOSPHATES 15 MMOLE/5ML IV SOLN
20.0000 mmol | Freq: Once | INTRAVENOUS | Status: AC
  Administered 2021-01-14: 20 mmol via INTRAVENOUS
  Filled 2021-01-14: qty 6.67

## 2021-01-14 MED ORDER — KETAMINE BOLUS VIA INFUSION
0.5000 mg/kg | Freq: Once | INTRAVENOUS | Status: AC
  Administered 2021-01-14: 20 mg via INTRAVENOUS
  Filled 2021-01-14: qty 20

## 2021-01-14 MED ORDER — PERAMPANEL 2 MG PO TABS
4.0000 mg | ORAL_TABLET | Freq: Every day | ORAL | Status: DC
  Administered 2021-01-14: 4 mg via ORAL
  Filled 2021-01-14: qty 2

## 2021-01-14 MED ORDER — LORAZEPAM 2 MG/ML IJ SOLN: 2.0000 mg | Freq: Once | INTRAMUSCULAR | Status: DC

## 2021-01-14 MED ORDER — VITAL AF 1.2 CAL PO LIQD
1000.0000 mL | ORAL | Status: DC
  Administered 2021-01-14: 1000 mL

## 2021-01-14 MED ORDER — GADOBUTROL 1 MMOL/ML IV SOLN
4.0000 mL | Freq: Once | INTRAVENOUS | Status: AC | PRN
  Administered 2021-01-14: 4 mL via INTRAVENOUS

## 2021-01-14 MED ORDER — ADULT MULTIVITAMIN W/MINERALS CH
1.0000 | ORAL_TABLET | Freq: Every day | ORAL | Status: DC
  Administered 2021-01-14 – 2021-01-26 (×13): 1
  Filled 2021-01-14 (×12): qty 1

## 2021-01-14 MED ORDER — KETAMINE BOLUS VIA INFUSION
0.5000 mg/kg | Freq: Once | INTRAVENOUS | Status: AC
  Administered 2021-01-14: 20 mg via INTRAVENOUS

## 2021-01-14 NOTE — Progress Notes (Signed)
Nutrition Follow-up  DOCUMENTATION CODES:   Underweight, Severe malnutrition in context of chronic illness  INTERVENTION:   - If unable to start advancing tube feeds to goal within next 24 hours, recommend initiation of TPN due to severity of pt's malnutrition  Initiate trickle tube feeds via OG tube: - Vital AF 1.2 @ 20 ml/hr (480 ml/day)  Trickle tube feeding regimen provides 576 kcal, 36 grams of protein, and 389 ml of H2O (meets 44% of kcal needs and 55% of protein needs).  Monitor magnesium, potassium, and phosphorus twice daily for at least 3 days, MD to replete as needed, as pt is at risk for refeeding syndrome given severe malnutrition.   Recommend advancing tube feeds as able to goal regimen: - Vital AF 1.2 @ 50 ml/hr (1200 ml/day)  Recommended tube feeding regimen at goal provides 1440 kcal, 90 grams of protein, and 973 ml of H2O.  - MVI with minerals daily per tube  NUTRITION DIAGNOSIS:   Severe Malnutrition related to chronic illness (anxiety, depression) as evidenced by severe muscle depletion, severe fat depletion.  Ongoing  GOAL:   Patient will meet greater than or equal to 90% of their needs  Unmet at this time, trickle tube feeds only per MD  MONITOR:   Vent status, Labs, Weight trends, I & O's  REASON FOR ASSESSMENT:   Ventilator, Consult Enteral/tube feeding initiation and management, Assessment of nutrition requirement/status  ASSESSMENT:   70 year old female who presented to the ED on 6/07 with seizures and stroke-like symptoms. PMH of seizures, GERD, anxiety, depression, insomnia, migraines, and ovarian cancer. Pt admitted with recurrent seizures with Todd's paralysis.  6/07 - intubated, burst suppression  Noted pt with continued seizure-like activity overnight. Per CCM, once seizures are controlled, pt will need LP to rule out autoimmune encephalitis and MRI of brain to rule out structural brain lesion.  Spoke with pt's RN who reports  coffee-ground output from OG tube continues. Discussed pt with CCM MD. Will start trickle tube feeds today. RD will leave recommendations for goal tube feeding regimen. If unable to start advancing tube feeds to goal within 24 hours, recommend initiation of TPN due to severity of pt's malnutrition.  Pt in MRI at time of RD visit.  Admit weight: 38.9 kg Current weight: 43.5 kg  Patient is currently intubated on ventilator support MV: 6.2 L/min Temp (24hrs), Avg:97 F (36.1 C), Min:93.74 F (34.3 C), Max:98.42 F (36.9 C)  Drips: Ketamine Versed NS: 10 ml/hr  Medications reviewed and include: colace, IV protonix, miralax, vimpat, keppra, phenobarbital, IV dilantin  Labs reviewed: phosphorus 1.5 CBG's: 62-101 x 24 hours  UOP: 1504 ml x 24 hours OGT output: 175 ml x 24 hours I/O's: +4.7 L since admit  Diet Order:   Diet Order             Diet NPO time specified  Diet effective now                   EDUCATION NEEDS:   No education needs have been identified at this time  Skin:  Skin Assessment: Reviewed RN Assessment  Last BM:  no documented BM  Height:   Ht Readings from Last 1 Encounters:  01/13/21 5\' 3"  (1.6 m)    Weight:   Wt Readings from Last 1 Encounters:  01/14/21 43.5 kg    BMI:  Body mass index is 16.99 kg/m.  Estimated Nutritional Needs:   Kcal:  1300-1500  Protein:  65-85 grams  Fluid:  >/=  1.5 L/day    Gustavus Bryant, MS, RD, LDN Inpatient Clinical Dietitian Please see AMiON for contact information.

## 2021-01-14 NOTE — Progress Notes (Signed)
NAME:  Samantha Atkins, MRN:  [id number], DOB:  05-May-1951, LOS: 2 ADMISSION DATE:  01/17/2021, CONSULTATION DATE:  01/13/2021 REFERRING MD:  Tamala Julian, CHIEF COMPLAINT:  Recurrent Seizures   History of Present Illness:  Samantha Atkins is a 70 y.o. female with medical history significant for  seizures, anxiety, depression, and ovarian cancer who presented for seizure.  History is obtained from the patient's husband is present at bedside.  Apparently around 7 PM last night patient began to have several episodes of nausea and vomiting throughout the night.  She last threw up around 5 AM.  Her husband had went to go get some water and other things and heard patient knocking over things on the nightstand.  When he went back in the room patient was having a full blown generalized tonic-clonic seizure for which she says that she was foaming at the mouth and not responding.  EMS was called and was witnessed having a 2-minute seizure with them as well and given Versed.  Patient husband notes that she has been having the intermittent episodes that last several minutes with difficulty following commands, changes in speech, weakness, and personality changes over the last month.  Previously had been seizure-free since 2019 prior to seizures restarting on 12/15/2020.  She had been followed by Dr. Delice Lesch who had set her up with 48-hour EEG monitoring and wanted to obtain a MRI of the brain with and without contrast and her Keppra has been increased to 500 mg twice daily recently on 5/20.  ED Course: Upon admission to the emergency department patient was seen as a code stroke.  CT scan of the head without contrast noted no acute abnormalities.  Neurology evaluated the patient and suspected symptoms were related to seizure and findings.  Afebrile, pulse 100-1 19, respirations 19-22, blood pressures 142/86-177/97, and O2 saturation maintained on room air.  Labs significant for hemoglobin 15.2, platelets 433, potassium 2.9, chloride 99, CO2  14, calcium 8.7, glucose 206, and anion gap 24.  Influenza and COVID-19 screening were negative.  CT angiogram of the head and neck with perfusion did not note any emergent large vessel occlusion, but did note ischemic CT perfusion values in the posterior right hemisphere no correlated arterial stenosis or occlusion.  Patient was given Keppra 1500 mg IV and continued on home regimen dose of Keppra and phenobarbital.  TRH called to admit.originally, however EEG revealed active seizures and focal seizures  that did not resolve with Keppra x 2, Ativan 3 mg Phenytoin, and Phenobarbital. Pt is continuing to have L sided seizures. She is awake and currently protecting her airway.  Neurology asked that we admit to the ICU due to difficult to resolve seizures. Plan is for ICU monitoring , and if she decompensates, or mental status changes,  , or is no longer protecting her airway, intubation and seizure suppression.   Significant Hospital Events: Including procedures, antibiotic start and stop dates in addition to other pertinent events   01/28/2021 Admission to [facility]  02/03/2021 status epilepticus 6/8 ETT  Interim History / Subjective:  Patient remained in status epilepticus despite adding multiple meds with increasing doses.  She became hypotensive so propofol was stopped, currently on Versed infusion, ketamine increased to 7.5 mg/kg/h, phenytoin, Keppra phenobarbital, Fycompa and pregabalin  Once her seizure gets under control, she will need LP to rule out autoimmune encephalitis and MRI brain to rule out a structural brain lesion Remain on ventilator  Objective   Blood pressure 112/63, pulse 82, temperature  98.24 F (36.8 C), resp. rate 15, height 5\' 3"  (1.6 m), weight 43.5 kg, SpO2 98 %.    Vent Mode: PRVC FiO2 (%):  [40 %] 40 % Set Rate:  [15 bmp] 15 bmp Vt Set:  [420 mL] 420 mL PEEP:  [5 cmH20] 5 cmH20 Plateau Pressure:  [11 cmH20-13 cmH20] 12 cmH20   Intake/Output Summary (Last 24 hours) at  01/14/2021 0857 Last data filed at 01/14/2021 0700 Gross per 24 hour  Intake 4253.66 ml  Output 1679 ml  Net 2574.66 ml   Filed Weights   01/11/2021 0738 01/13/21 0500 01/14/21 0500  Weight: 38.9 kg 39.6 kg 43.5 kg    Examination: General: Critically ill-appearing elderly Caucasian female, lying in the bed, orally intubated  HENT: NCAT, PERRLA, No JVD, No LAD, ETT and OGT in place Lungs: Reduced air entry at the bases bilaterally, no wheezes or rhonchi Cardiovascular: S1, S2, RRR, No RMG, ST per tele Abdomen: Soft and flat, BS +, ND, NT, very thin,  Extremities: No obvious deformities, tin with minimal muscle mass, currently with L arm jerking Neuro: Sedated, not following commands Skin: No rash  Labs/imaging that I havepersonally reviewed  (right click and "Reselect all SmartList Selections" daily)   Labs significant for hemoglobin 106, platelets 271, sodium 139 potassium 3.8, CO2 23, calcium 7.7, Mag 1.8, Phos 1.5 Resolved Hospital Problem list     Assessment & Plan:  Status epilepticus Continue neuro check Patient continued to have seizures based on continuous EEG despite being on multiple antiepileptic meds including Keppra, phenytoin, Vimpat, phenobarbital ketamine and Versed infusion Neurology is following NPO for now Seizure Precautions Continuous EEG monitoring MRI Brain per Neuro when able to obtain Will get LP to rule out autoimmune encephalitis once her seizure stops Trend Mag and maintain > 2.0 Trend K and maintain > 4.0  Acute respiratory insufficiency due to status epilepticus requiring deep IV sedation Continue lung protective ventilation SAT and SBT as tolerated  Hypocalcemia, Hypokalemia, hypomagnesemia and hypophosphatemia Continue supplement electrolyte and monitor  Acute upper GI bleeding Acute blood loss anemia Patient is having coffee-ground emesis Her hemoglobin is down from 14 on admission to 10.6 H&H remained stable Continue Protonix 40 mg  twice daily IV Monitor H&H every 8 hours  Chronic HFpEF No signs of volume overload Monitor intake and output and daily weight  Cachexia Nutritionist following Continue tube feeds and supplement   Best practice (right click and "Reselect all SmartList Selections" daily)  Diet:  NPO tube feeds Pain/Anxiety/Delirium protocol (if indicated): Ketamine/Versed VAP protocol (if indicated): Yes DVT prophylaxis: Subcutaneous Heparin GI prophylaxis: PPI Glucose control:  SSI Yes Central venous access: Yes, needed Arterial line:  N/A Foley:  N/A Mobility:  bed rest  PT consulted: N/A Last date of multidisciplinary goals of care discussion: 6/8, spoke with patient's husband, decided to continue with full aggressive care Code Status:  full code Disposition: ICU  Labs   CBC: Recent Labs  Lab 01/15/2021 0634 01/17/2021 0640 01/20/2021 2326 01/13/21 0733 01/13/21 1817 01/14/21 0222  WBC 9.3  --   --  5.9  --   --   NEUTROABS 8.0*  --   --   --   --   --   HGB 15.2* 15.6* 10.9* 10.6* 11.0* 10.6*  HCT 45.7 46.0 32.0* 31.8* 33.4* 32.6*  MCV 104.6*  --   --  105.3*  --   --   PLT 433*  --   --  271  --   --  Basic Metabolic Panel: Recent Labs  Lab 01/16/2021 0634 01/31/2021 0640 02/03/2021 1055 01/11/2021 1426 01/11/2021 2326 01/07/2021 2327 01/13/21 0524  NA 137 136  --   --  137  --  139  K 2.9* 2.8*  --   --  4.2 4.2 3.8  CL 99 102  --   --   --   --  108  CO2 14*  --   --   --   --   --  23  GLUCOSE 206* 204*  --   --   --   --  77  BUN 11 11  --   --   --   --  7*  CREATININE 0.95 0.50  --   --   --   --  0.59  CALCIUM 8.7*  --   --   --   --   --  7.7*  MG  --   --  2.1  --   --   --  1.8  PHOS  --   --   --  1.9*  --   --  1.5*   GFR: Estimated Creatinine Clearance: 45.6 mL/min (by C-G formula based on SCr of 0.59 mg/dL). Recent Labs  Lab 01/13/2021 0634 01/25/2021 1426 01/30/2021 1438 01/13/21 0733  PROCALCITON  --  3.37  --   --   WBC 9.3  --   --  5.9  LATICACIDVEN  --    --  1.6  --     Liver Function Tests: Recent Labs  Lab 01/11/2021 0634 01/13/21 0524  AST 33 33  ALT 30 22  ALKPHOS 114 87  BILITOT 0.4 0.5  PROT 7.3 5.5*  ALBUMIN 3.8 3.0*   No results for input(s): LIPASE, AMYLASE in the last 168 hours. No results for input(s): AMMONIA in the last 168 hours.  ABG    Component Value Date/Time   PHART 7.380 01/20/2021 2326   PCO2ART 36.0 01/09/2021 2326   PO2ART 250 (H) 01/10/2021 2326   HCO3 21.4 01/21/2021 2326   TCO2 22 02/01/2021 2326   ACIDBASEDEF 3.0 (H) 01/21/2021 2326   O2SAT 100.0 01/24/2021 2326     Coagulation Profile: Recent Labs  Lab 02/01/2021 0634  INR 1.0    Cardiac Enzymes: Recent Labs  Lab 01/13/21 0524  CKTOTAL 702*    HbA1C: No results found for: HGBA1C  CBG: Recent Labs  Lab 01/13/21 1506 01/13/21 1918 01/13/21 2318 01/14/21 0317 01/14/21 0755  GLUCAP 101* 79 83 77 62*    Total critical care time: 50 minutes  Performed by: [place] care time was exclusive of separately billable procedures and treating other patients.   Critical care was necessary to treat or prevent imminent or life-threatening deterioration.   Critical care was time spent personally by me on the following activities: development of treatment plan with patient and/or surrogate as well as nursing, discussions with consultants, evaluation of patient's response to treatment, examination of patient, obtaining history from patient or surrogate, ordering and performing treatments and interventions, ordering and review of laboratory studies, ordering and review of radiographic studies, pulse oximetry and re-evaluation of patient's condition.   Jacky Kindle MD [facility] See Amion for pager If no response to pager, please call [phone] until 7pm After 7pm, Please call E-link [phone]

## 2021-01-14 NOTE — Progress Notes (Signed)
SLP Cancellation Note  Patient Details Name: Samantha Atkins MRN: [id number] DOB: 04-09-1951   Cancelled treatment:        Intubated. Will follow.   Houston Siren 01/14/2021, 8:38 AM

## 2021-01-14 NOTE — Procedures (Addendum)
Patient Name: Samantha Atkins  MRN: [id number]  Epilepsy Attending: Lora Havens  Referring Physician/Provider: Dr. Su Monks Duration: 01/13/2021 1347 to 01/14/2021 1020   Patient history: 70 year old female presented with focal convulsive status epilepticus.  EEG to evaluate for seizures.   Level of alertness: Comatose   AEDs during EEG study: Keppra, phenobarbital, PHT, LCM, Perampanel, ketamine, versed   Technical aspects: This EEG study was done with scalp electrodes positioned according to the 10-20 International system of electrode placement. Electrical activity was acquired at a sampling rate of 500Hz  and reviewed with a high frequency filter of 70Hz  and a low frequency filter of 1Hz . EEG data were recorded continuously and digitally stored.   Description: EEG initially showed focal nonconvulsive status epilepticus arising from right centro-temporo-parietal region.  As Versed was increased after around 2030 on 04/15/2021, EEG showed burst attenuation pattern with highly epileptiform bursts in right centro- temporo-parietal region lasting 3 to 5 seconds alternating with generalized EEG attenuation lasting 3 to 5 seconds.  Additionally EEG showed polymorphic mixed frequencies with predominantly 2 to 3 Hz delta slowing and overriding 15-18hz  beta activity in left hemisphere.   ABNORMALITY -Focal non-convulsive status epilepticus,right centro-temporo-parietal region. -Burst attenuation with highly epileptiform bursts, ,right centro-temporo-parietal region.   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  As Versed was gradually increased, after around 2030 on 04/15/2021, EEG improved but still showed highly epileptiform discharges arising from right centro-temporo-parietal region as well as profound diffuse encephalopathy likely related to seizures, sedation.     Samantha Atkins [name]

## 2021-01-14 NOTE — Progress Notes (Addendum)
Brief Neuro Note:  Continued to have multiple breakthrough clinical seizures overnight. Was able to get them to stop before they would recur.  Versed was increased several times overnight along with boluses and is now on 100mg /hr of Versed gtt  Ketamine was increased and boluses given and now at 7.5mg /Kg/hr.  Nelson Pager Number [phone]

## 2021-01-14 NOTE — Progress Notes (Addendum)
Last night's events:  2110: This RN noticed patient to have seizure like activity involving right upper and lower extremities. Slow rhythmic movements with muscle stiffness in between. Pupils at this time large (7 or 8 mm), round and reactive.   2114: 2 mg PRN ativan dose given and Neuro MD notified.  2133: Seizure activity still hasn't stopped, New order from Neuro MD to give 5 mg Versed bolus from bag gtt and increase continuous rate of versed from 50 ml/hr to 70 ml/hr. Activity stopped after this.  0135: Patient with seizure like activity involving all extremities, face, and neck. Same slow rhythmic movement and stiffness of muscles as earlier in the night.   0138: 2 mg PRN ativan dose given  0150: Neuro MD made aware seizure like activity did not stop after ativan dose. New order to give 10 mg versed bolus and another 2 mg of ativan- given at 0152  0158: Patient still having seizure like activity, new order to give .5mg /kg ketamine bolus- given at 0200  0215: Patient still having seizure like activity, new order to give another  mg/kg ketamine bolus and increase versed gtt from 70 ml/hr to 80 ml/hr. Activity stopped after this.  0600: Patient with seizure like activity involving feet, and both arms. 2 mg PRN ativan given at 0623.  7628: Neuro MD paged about seizure activity not stopping, new order for 10 mg versed bolus, raise versed gtt rate from 80 ml/hr to 90 ml/hr, and give a .25 mg/kg ketamine bolus- given at [phone]  0629: Seizure like activity has not stopped, Neuro MD paged. New order for another 10 mg versed bolus and .25 mg/kg ketamine bolus.  0639: Seizure like activity still continues after boluses. Neuro MD made aware. New order to give a  mg/kg ketamine bolus and increase ketamine gtt rate from 5 mg/kg/hr to 6 mg/kg/hr  0658: Seizure activity continuing, Right arm very stiff lifting above head. New order for  .5 kg/mg ketamine bolus, 2 mg ativan push, and increase ketamine  rate from 6 mg/kg/hr to 7.5 mg/kg/hr

## 2021-01-14 NOTE — Progress Notes (Signed)
Reapplied EEG electrodes with modification of placement at several sites due to skin breakdown from previous application.   cEEG study resumed. Notified [facility] monitoring.

## 2021-01-14 NOTE — Progress Notes (Signed)
PT transported to CT via ventilator with no complications. Pt returned to room. Pt's husband in room.

## 2021-01-14 NOTE — Progress Notes (Addendum)
Subjective: Continues to have seizures overnight with some improvement after further titrating up versed and ketamine.  Husband at bedside.  ROS: Unable to obtain due to poor mental status  Examination  Vital signs in last 24 hours: Temp:  [93.74 F (34.3 C)-98.42 F (36.9 C)] 98.24 F (36.8 C) (06/09 0742) Pulse Rate:  [61-87] 82 (06/09 0742) Resp:  [8-18] 15 (06/09 0742) BP: (88-166)/(56-97) 112/63 (06/09 0742) SpO2:  [97 %-100 %] 98 % (06/09 0743) FiO2 (%):  [40 %] 40 % (06/09 1228) Weight:  [43.5 kg] 43.5 kg (06/09 0500)  General: lying in bed, not in apparent distress CVS: pulse-normal rate and rhythm RS: Intubated, CTAB Extremities: normal, warm Neuro: Comatose, does not open eyes to noxious stimuli, PERRLA, corneal reflex absent, gag reflex absent, does not withdraw to noxious stimuli in any extremity  Basic Metabolic Panel: Recent Labs  Lab 02/01/2021 0634 01/15/2021 0640 02/04/2021 1055 02/01/2021 1426 01/15/2021 2326 01/09/2021 2327 01/13/21 0524 01/14/21 1022  NA 137 136  --   --  137  --  139 139  K 2.9* 2.8*  --   --  4.2 4.2 3.8 3.5  CL 99 102  --   --   --   --  108 110  CO2 14*  --   --   --   --   --  23 17*  GLUCOSE 206* 204*  --   --   --   --  77 54*  BUN 11 11  --   --   --   --  7* <5*  CREATININE 0.95 0.50  --   --   --   --  0.59 0.51  CALCIUM 8.7*  --   --   --   --   --  7.7* 6.8*  MG  --   --  2.1  --   --   --  1.8 1.8  PHOS  --   --   --  1.9*  --   --  1.5* 2.0*    CBC: Recent Labs  Lab 01/11/2021 0634 01/14/2021 0640 01/24/2021 2326 01/13/21 0733 01/13/21 1817 01/14/21 0222 01/14/21 1022  WBC 9.3  --   --  5.9  --   --  4.2  NEUTROABS 8.0*  --   --   --   --   --   --   HGB 15.2*   < > 10.9* 10.6* 11.0* 10.6* 10.1*  HCT 45.7   < > 32.0* 31.8* 33.4* 32.6* 30.7*  MCV 104.6*  --   --  105.3*  --   --  106.6*  PLT 433*  --   --  271  --   --  244   < > = values in this interval not displayed.     Coagulation Studies: Recent Labs     01/21/2021 0634  LABPROT 13.2  INR 1.0    Imaging MRI brain with and without contrast 01/14/2021:  1. Inferior bifrontal extra-axial fluid collections measuring up to 13 mm in thickness with wispy enhancement, restricted diffusion and evidence of hemorrhage. There is mild adjacent edema in the right frontal cortex without significant mass effect. Findings could be related to hemorrhage and/or infection/empyema. No hyperdense blood products were seen on recent CT head and repeat noncontrast head CT is recommended to evaluate for interval hyperdense hemorrhage in this location. Additionally, recommend lumbar puncture. 2. Ill-defined T2/FLAIR hyperintensity and mild DWI hyperintensity in the right thalamus, which is of unclear  etiology but potentially related to the patient's reported seizure activity, encephalitis, or less likely infarct or low grade glioma. 3. Small parenchymal focus of ill-defined T2/FLAIR hyperintensity in the posterior left cerebellum, which could be posttraumatic or infectious. Seizure activity is a consideration, but this location is atypical. 4. Chronic microvascular ischemic disease.     ASSESSMENT AND PLAN: 70 year old female who presented with focal convulsive status epilepticus which has improved with Dilantin load but continues to have nonconvulsive status epilepticus on EEG   Focal status epilepticus, refractory -Unclear etiology of status epilepticus, no evidence of infection so far (UA clean, no growth on blood cultures, chest x-ray clear), suspect autoimmune versus neoplastic/paraneoplastic causes   Recommendations -On ketamine at 7.5 mg mg/kg/hour, Versed 100 mL/h -Continue Keppra 2000 mg twice daily, Dilantin 150 mg twice daily, phenobarbital 130 mg twice daily, Vimpat 100 mg twice daily, pregabalin 150 mg twice daily, perampanel 4 mg nightly -MRI brain showed extra-axial brain collections and repeat CT was recommended.  CT head without contrast  ordered - Lumbar puncture to look for infectious, autoimmune, neoplastic-paraneoplastic etiologies,  -If LP negative for infections, will need CT chest, abdomen pelvis with contrast for neoplastic work-up - Continue LTM EEG while we are adjusting medications -If no evidence of infection on lumbar puncture, will start Solu-Medrol 1000 mg for 5 days -Last option will be pentobarbital, if seizures persist -Management of rest of the comorbidities per primary team   CRITICAL CARE Performed by: Lora Havens     Total critical care time: 43minutes   Critical care time was exclusive of separately billable procedures and treating other patients.   Critical care was necessary to treat or prevent imminent or life-threatening deterioration.   Critical care was time spent personally by me on the following activities: development of treatment plan with patient and/or surrogate as well as nursing, discussions with consultants, evaluation of patient's response to treatment, examination of patient, obtaining history from patient or surrogate, ordering and performing treatments and interventions, ordering and review of laboratory studies, ordering and review of radiographic studies, pulse oximetry and re-evaluation of patient's condition.      Zeb Comfort Epilepsy [facility] For questions after 5pm please refer to AMION to reach the Neurologist on call

## 2021-01-14 NOTE — Progress Notes (Signed)
OT Cancellation Note  Patient Details Name: Breeanna Galgano MRN: [id number] DOB: 08-22-1950   Cancelled Treatment:    Reason Eval/Treat Not Completed: Patient not medically ready  Myrene Buddy Kalinda Romaniello, OT/L   Acute OT Clinical Specialist Acute Rehabilitation Services Pager [phone] Office [phone]  01/14/2021, 7:41 AM

## 2021-01-14 NOTE — Procedures (Signed)
Lumbar Puncture Procedure Note  Keisha Amer  [id number]  [birth year]  Date:01/14/21  Time:4:26 PM   Provider Performing:Donyell Carrell   Procedure: Lumbar Puncture (20721)  Indication(s) Rule out meningitis  Consent Risks of the procedure as well as the alternatives and risks of each were explained to the patient and/or caregiver.  Consent for the procedure was obtained and is signed in the bedside chart  Anesthesia Topical only with 1% lidocaine    Time Out Verified patient identification, verified procedure, site/side was marked, verified correct patient position, special equipment/implants available, medications/allergies/relevant history reviewed, required imaging and test results available.   Sterile Technique Maximal sterile technique including sterile barrier drape, hand hygiene, sterile gown, sterile gloves, mask, hair covering.    Procedure Description Using palpation, approximate location of L3-L4 space identified.   Lidocaine used to anesthetize skin and subcutaneous tissue overlying this area.  A 20g spinal needle was then used to access the subarachnoid space. Opening pressure:13 Closing pressure:Not obtained. 15 cc CSF obtained.  Complications/Tolerance None; patient tolerated the procedure well.   EBL Minimal   Specimen(s) CSF

## 2021-01-14 NOTE — Progress Notes (Signed)
PT Cancellation Note  Patient Details Name: Samantha Atkins MRN: [id number] DOB: [birth year]   Cancelled Treatment:    Reason Eval/Treat Not Completed: Patient not medically ready at this time. Pt with multiple seizures overnight, RN asking PT to hold at this time. Will continue to follow and evaluate as appropriate.   Hardie Pulley, DPT   Acute Rehabilitation Department Pager #: [phone]   Otho Bellows 01/14/2021, 11:02 AM

## 2021-01-14 NOTE — Progress Notes (Signed)
Brief Neuro Update:  Spoke with Thayer Jew at [facility] main lab.   CSF tube 1 WBC count of 1 CSF tube 2 WBC count of 2.  Discussed with Dr. Zeb Comfort and will start Solumedrol 1G Q 24hours x 5 doses with protonix IV.  [place] Pager Number [phone]

## 2021-01-14 NOTE — Progress Notes (Signed)
Patient finally stopped seizure like activity. Report given to Susie, both RNs witnessed no movement at [employer]

## 2021-01-14 NOTE — Progress Notes (Signed)
Pt transported to MRI on vent. and back to 4N17. Vitals are stable, no complications, RN at bedside and RT will cont. to monitor.

## 2021-01-14 NOTE — Progress Notes (Signed)
Removed EEG electrodes.  Patient to MRI.  Skin breakdown observed at the following electrode positions: F8,Fp2, Fp1, F7.

## 2021-01-15 ENCOUNTER — Inpatient Hospital Stay (HOSPITAL_COMMUNITY): Payer: Managed Care, Other (non HMO)

## 2021-01-15 DIAGNOSIS — G0481 Other encephalitis and encephalomyelitis: Secondary | ICD-10-CM

## 2021-01-15 DIAGNOSIS — G9341 Metabolic encephalopathy: Secondary | ICD-10-CM

## 2021-01-15 DIAGNOSIS — I5042 Chronic combined systolic (congestive) and diastolic (congestive) heart failure: Secondary | ICD-10-CM

## 2021-01-15 LAB — MAGNESIUM
Magnesium: 1.5 mg/dL — ABNORMAL LOW (ref 1.7–2.4)
Magnesium: 2.3 mg/dL (ref 1.7–2.4)

## 2021-01-15 LAB — MISC LABCORP TEST (SEND OUT)
Labcorp test code: 9985
Labcorp test code: 9985

## 2021-01-15 LAB — COMPREHENSIVE METABOLIC PANEL
ALT: 22 U/L (ref 0–44)
AST: 27 U/L (ref 15–41)
Albumin: 2.9 g/dL — ABNORMAL LOW (ref 3.5–5.0)
Alkaline Phosphatase: 126 U/L (ref 38–126)
Anion gap: 15 (ref 5–15)
BUN: <5 mg/dL — ABNORMAL LOW (ref 8–23)
CO2: 16 mmol/L — ABNORMAL LOW (ref 22–32)
Calcium: 6.9 mg/dL — ABNORMAL LOW (ref 8.9–10.3)
Chloride: 102 mmol/L (ref 98–111)
Creatinine, Ser: 0.44 mg/dL (ref 0.44–1.00)
GFR, Estimated: >60 mL/min (ref >60)
Glucose, Bld: 131 mg/dL — ABNORMAL HIGH (ref 70–99)
Potassium: 3.6 mmol/L (ref 3.5–5.1)
Sodium: 133 mmol/L — ABNORMAL LOW (ref 135–145)
Total Bilirubin: 1 mg/dL (ref 0.3–1.2)
Total Protein: 5.9 g/dL — ABNORMAL LOW (ref 6.5–8.1)

## 2021-01-15 LAB — HEMOGLOBIN AND HEMATOCRIT, BLOOD
HCT: 37.1 % (ref 36.0–46.0)
HCT: 35.8 % — ABNORMAL LOW (ref 36.0–46.0)
Hemoglobin: 12 g/dL (ref 12.0–15.0)
Hemoglobin: 11.9 g/dL — ABNORMAL LOW (ref 12.0–15.0)

## 2021-01-15 LAB — PHOSPHORUS
Phosphorus: 2 mg/dL — ABNORMAL LOW (ref 2.5–4.6)
Phosphorus: >30 mg/dL — ABNORMAL HIGH (ref 2.5–4.6)

## 2021-01-15 LAB — GLUCOSE, CAPILLARY
Glucose-Capillary: 125 mg/dL — ABNORMAL HIGH (ref 70–99)
Glucose-Capillary: 170 mg/dL — ABNORMAL HIGH (ref 70–99)
Glucose-Capillary: 175 mg/dL — ABNORMAL HIGH (ref 70–99)
Glucose-Capillary: 187 mg/dL — ABNORMAL HIGH (ref 70–99)
Glucose-Capillary: 210 mg/dL — ABNORMAL HIGH (ref 70–99)
Glucose-Capillary: 266 mg/dL — ABNORMAL HIGH (ref 70–99)

## 2021-01-15 LAB — PHENYTOIN LEVEL, TOTAL: Phenytoin Lvl: 7.5 ug/mL — ABNORMAL LOW (ref 10.0–20.0)

## 2021-01-15 MED ORDER — POTASSIUM PHOSPHATES 15 MMOLE/5ML IV SOLN
15.0000 mmol | Freq: Once | INTRAVENOUS | Status: AC
Start: 2021-01-15 — End: 2021-01-15
  Administered 2021-01-15: 15 mmol via INTRAVENOUS
  Filled 2021-01-15: qty 5

## 2021-01-15 MED ORDER — MIDAZOLAM BOLUS VIA INFUSION
20.0000 mg | Freq: Once | INTRAVENOUS | Status: AC
  Administered 2021-01-15: 20 mg via INTRAVENOUS
  Filled 2021-01-15: qty 20

## 2021-01-15 MED ORDER — IOHEXOL 300 MG/ML  SOLN
75.0000 mL | Freq: Once | INTRAMUSCULAR | Status: AC | PRN
  Administered 2021-01-15: 75 mL via INTRAVENOUS

## 2021-01-15 MED ORDER — INSULIN ASPART 100 UNIT/ML IJ SOLN
0.0000 [IU] | INTRAMUSCULAR | Status: DC
  Administered 2021-01-15: 8 [IU] via SUBCUTANEOUS
  Administered 2021-01-15: 5 [IU] via SUBCUTANEOUS
  Administered 2021-01-15 (×2): 3 [IU] via SUBCUTANEOUS
  Administered 2021-01-16 (×2): 5 [IU] via SUBCUTANEOUS
  Administered 2021-01-16: 3 [IU] via SUBCUTANEOUS
  Administered 2021-01-16 (×2): 5 [IU] via SUBCUTANEOUS
  Administered 2021-01-17: 3 [IU] via SUBCUTANEOUS
  Administered 2021-01-17: 8 [IU] via SUBCUTANEOUS
  Administered 2021-01-17: 3 [IU] via SUBCUTANEOUS
  Administered 2021-01-17 (×2): 8 [IU] via SUBCUTANEOUS
  Administered 2021-01-18: 3 [IU] via SUBCUTANEOUS
  Administered 2021-01-18: 2 [IU] via SUBCUTANEOUS
  Administered 2021-01-18: 5 [IU] via SUBCUTANEOUS
  Administered 2021-01-18: 3 [IU] via SUBCUTANEOUS
  Administered 2021-01-18: 5 [IU] via SUBCUTANEOUS
  Administered 2021-01-19: 3 [IU] via SUBCUTANEOUS
  Administered 2021-01-19: 5 [IU] via SUBCUTANEOUS
  Administered 2021-01-19: 2 [IU] via SUBCUTANEOUS
  Administered 2021-01-19: 3 [IU] via SUBCUTANEOUS
  Administered 2021-01-19: 5 [IU] via SUBCUTANEOUS
  Administered 2021-01-19: 2 [IU] via SUBCUTANEOUS
  Administered 2021-01-20 (×2): 3 [IU] via SUBCUTANEOUS
  Administered 2021-01-20 (×2): 2 [IU] via SUBCUTANEOUS
  Administered 2021-01-20: 3 [IU] via SUBCUTANEOUS
  Administered 2021-01-21 – 2021-01-23 (×5): 2 [IU] via SUBCUTANEOUS
  Administered 2021-01-24 (×2): 3 [IU] via SUBCUTANEOUS
  Administered 2021-01-25: 2 [IU] via SUBCUTANEOUS
  Administered 2021-01-25 – 2021-01-26 (×2): 3 [IU] via SUBCUTANEOUS
  Administered 2021-01-26 (×2): 2 [IU] via SUBCUTANEOUS
  Administered 2021-01-26: 3 [IU] via SUBCUTANEOUS
  Administered 2021-01-26 – 2021-01-27 (×2): 2 [IU] via SUBCUTANEOUS

## 2021-01-15 MED ORDER — IOHEXOL 9 MG/ML PO SOLN
ORAL | Status: AC
  Administered 2021-01-15: 500 mL
  Filled 2021-01-15: qty 500

## 2021-01-15 MED ORDER — PREGABALIN 75 MG PO CAPS
200.0000 mg | ORAL_CAPSULE | Freq: Two times a day (BID) | ORAL | Status: DC
  Administered 2021-01-15 – 2021-01-21 (×12): 200 mg
  Filled 2021-01-15 (×12): qty 2

## 2021-01-15 MED ORDER — PANTOPRAZOLE SODIUM 40 MG IV SOLR
40.0000 mg | INTRAVENOUS | Status: DC
  Administered 2021-01-16: 40 mg via INTRAVENOUS
  Filled 2021-01-15 (×2): qty 40

## 2021-01-15 MED ORDER — HYDRALAZINE HCL 20 MG/ML IJ SOLN
10.0000 mg | Freq: Four times a day (QID) | INTRAMUSCULAR | Status: DC | PRN
  Administered 2021-01-15 – 2021-01-19 (×5): 10 mg via INTRAVENOUS
  Filled 2021-01-15 (×5): qty 1

## 2021-01-15 MED ORDER — SODIUM CHLORIDE 0.9 % IV SOLN
150.0000 mg | Freq: Two times a day (BID) | INTRAVENOUS | Status: DC
  Administered 2021-01-15 – 2021-01-21 (×13): 150 mg via INTRAVENOUS
  Filled 2021-01-15 (×20): qty 3

## 2021-01-15 MED ORDER — LORAZEPAM 2 MG/ML IJ SOLN
4.0000 mg | Freq: Once | INTRAMUSCULAR | Status: AC
  Administered 2021-01-15: 4 mg via INTRAVENOUS

## 2021-01-15 MED ORDER — PERAMPANEL 2 MG PO TABS
6.0000 mg | ORAL_TABLET | Freq: Every day | ORAL | Status: DC
  Administered 2021-01-15 – 2021-01-17 (×3): 6 mg
  Filled 2021-01-15 (×3): qty 3

## 2021-01-15 MED ORDER — VITAL AF 1.2 CAL PO LIQD
1000.0000 mL | ORAL | Status: DC
  Administered 2021-01-15 – 2021-01-26 (×12): 1000 mL
  Filled 2021-01-15 (×2): qty 1000

## 2021-01-15 MED ORDER — HEPARIN SODIUM (PORCINE) 5000 UNIT/ML IJ SOLN
5000.0000 [IU] | Freq: Three times a day (TID) | INTRAMUSCULAR | Status: DC
  Administered 2021-01-15 – 2021-01-27 (×36): 5000 [IU] via SUBCUTANEOUS
  Filled 2021-01-15 (×35): qty 1

## 2021-01-15 MED ORDER — MAGNESIUM SULFATE 4 GM/100ML IV SOLN
4.0000 g | Freq: Once | INTRAVENOUS | Status: AC
  Administered 2021-01-15: 4 g via INTRAVENOUS
  Filled 2021-01-15: qty 100

## 2021-01-15 NOTE — Progress Notes (Signed)
NAME:  Samantha Atkins, MRN:  [id number], DOB:  1951/05/07, LOS: 3 ADMISSION DATE:  01/21/2021, CONSULTATION DATE:  01/29/2021 REFERRING MD:  Tamala Julian, CHIEF COMPLAINT:  Recurrent Seizures   History of Present Illness:  Samantha Atkins is a 70 y.o. female with medical history significant for  seizures, anxiety, depression, and ovarian cancer who presented for seizure.  History is obtained from the patient's husband is present at bedside.  Apparently around 7 PM last night patient began to have several episodes of nausea and vomiting throughout the night.  She last threw up around 5 AM.  Her husband had went to go get some water and other things and heard patient knocking over things on the nightstand.  When he went back in the room patient was having a full blown generalized tonic-clonic seizure for which she says that she was foaming at the mouth and not responding.  EMS was called and was witnessed having a 2-minute seizure with them as well and given Versed.  Patient husband notes that she has been having the intermittent episodes that last several minutes with difficulty following commands, changes in speech, weakness, and personality changes over the last month.  Previously had been seizure-free since 2019 prior to seizures restarting on 12/15/2020.  She had been followed by Dr. Delice Lesch who had set her up with 48-hour EEG monitoring and wanted to obtain a MRI of the brain with and without contrast and her Keppra has been increased to 500 mg twice daily recently on 5/20.  ED Course: Upon admission to the emergency department patient was seen as a code stroke.  CT scan of the head without contrast noted no acute abnormalities.  Neurology evaluated the patient and suspected symptoms were related to seizure and findings.  Afebrile, pulse 100-1 19, respirations 19-22, blood pressures 142/86-177/97, and O2 saturation maintained on room air.  Labs significant for hemoglobin 15.2, platelets 433, potassium 2.9, chloride 99, CO2  14, calcium 8.7, glucose 206, and anion gap 24.  Influenza and COVID-19 screening were negative.  CT angiogram of the head and neck with perfusion did not note any emergent large vessel occlusion, but did note ischemic CT perfusion values in the posterior right hemisphere no correlated arterial stenosis or occlusion.  Patient was given Keppra 1500 mg IV and continued on home regimen dose of Keppra and phenobarbital.  TRH called to admit.originally, however EEG revealed active seizures and focal seizures  that did not resolve with Keppra x 2, Ativan 3 mg Phenytoin, and Phenobarbital. Pt is continuing to have L sided seizures. She is awake and currently protecting her airway.  Neurology asked that we admit to the ICU due to difficult to resolve seizures. Plan is for ICU monitoring , and if she decompensates, or mental status changes,  , or is no longer protecting her airway, intubation and seizure suppression.   Significant Hospital Events: Including procedures, antibiotic start and stop dates in addition to other pertinent events   01/16/2021 Admission to [facility]  01/11/2021 status epilepticus 6/8 ETT 6/9 had hyperkalemia, LP performed shows no infection 6/10 ongoing seizures noted on EEG  Interim History / Subjective:  Persistent seizures noted on EEG this morning. CSF without pleocytosis, no clear infectious trigger. Started on solumedrol. Husband at bedside, updated him. Hypothermic on  bair  hugger this morning.   Objective   Blood pressure 125/74, pulse 71, temperature 97.7 F (36.5 C), temperature source Oral, resp. rate 16, height 5\' 3"  (1.6 m), weight 43.5 kg, SpO2  96 %.    Vent Mode: PRVC FiO2 (%):  [40 %] 40 % Set Rate:  [15 bmp] 15 bmp Vt Set:  [420 mL] 420 mL PEEP:  [5 cmH20] 5 cmH20 Plateau Pressure:  [13 cmH20-16 cmH20] 13 cmH20   Intake/Output Summary (Last 24 hours) at 01/15/2021 1107 Last data filed at 01/15/2021 0800 Gross per 24 hour  Intake 5748.02 ml  Output 5425 ml  Net  323.02 ml   Filed Weights   01/15/2021 0738 01/13/21 0500 01/14/21 0500  Weight: 38.9 kg 39.6 kg 43.5 kg    Examination: General: critically ill, intubated, sedated HENT: ETT to vent, EEG leads in place Lungs: ctab no wheezes or crackles Cardiovascular: RRR, no murmurs Abdomen: soft, nontender nondistended Extremities: no edema Neuro: Sedated, not responsive to noxious stimuli, no gag  Labs/imaging that I havepersonally reviewed  (right click and "Reselect all SmartList Selections" daily)   Resolved Hospital Problem list     Assessment & Plan:  Status epilepticus Continue neuro check Patient continues to have seizures based on continuous EEG despite being on multiple antiepileptic meds including Keppra, phenytoin, Vimpat, phenobarbital ketamine and Versed infusion Neurology is following Seizure Precautions Continuous EEG monitoring LP negative for infectious etiology, 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 Decrease protonix to daily Monitor H&H daily  Chronic HFpEF No signs of volume overload Monitor intake and output and daily weight  Cachexia Nutritionist following Continue tube feeds and supplement   Best practice (right click and "Reselect all SmartList Selections" daily)  Diet:  NPO tube feeds Pain/Anxiety/Delirium protocol (if indicated): Ketamine/Versed VAP protocol (if indicated): Yes DVT prophylaxis: Subcutaneous Heparin GI prophylaxis: PPI Glucose control:  SSI  Yes Central venous access: Yes, needed Arterial line:  N/A Foley:  N/A Mobility:  bed rest  PT consulted: N/A Last date of multidisciplinary goals of care discussion: 6/8, spoke with patient's husband, decided to continue with full aggressive care Code Status:  full code Disposition: ICU  Labs   CBC: Recent Labs  Lab 02/03/2021 0634 01/10/2021 0640 01/13/21 0733 01/13/21 1817 01/14/21 0222 01/14/21 1022 01/14/21 1620 01/15/21 0200  WBC 9.3  --  5.9  --   --  4.2  --   --   NEUTROABS 8.0*  --   --   --   --   --   --   --   HGB 15.2*   < > 10.6* 11.0* 10.6* 10.1* 10.9* 12.0  HCT 45.7   < > 31.8* 33.4* 32.6* 30.7* 33.7* 37.1  MCV 104.6*  --  105.3*  --   --  106.6*  --   --   PLT 433*  --  271  --   --  244  --   --    < > = values in this interval not displayed.    Basic Metabolic Panel: Recent Labs  Lab 01/21/2021 0634 01/23/2021 0640 01/11/2021 1055 02/03/2021 1426 01/30/2021 2326 01/13/2021 2327 01/13/21 0524 01/14/21 1022 01/14/21 1620 01/15/21 0400  NA 137 136  --   --  137  --  139 139  --  133*  K 2.9* 2.8*  --   --  4.2 4.2 3.8 3.5  --  3.6  CL 99 102  --   --   --   --  108 110  --  102  CO2 14*  --   --   --   --   --  23 17*  --  16*  GLUCOSE 206* 204*  --   --   --   --  77 54*  --  131*  BUN 11 11  --   --   --   --  7* <5*  --  <5*  CREATININE 0.95 0.50  --   --   --   --  0.59 0.51  --  0.44  CALCIUM 8.7*  --   --   --   --   --  7.7* 6.8*  --  6.9*  MG  --   --  2.1  --   --   --  1.8 1.8 1.6* 1.5*  PHOS  --   --   --  1.9*  --   --  1.5* 2.0* 2.9 2.0*   GFR: Estimated Creatinine Clearance: 45.6 mL/min (by C-G formula based on SCr of 0.44 mg/dL). Recent Labs  Lab 01/13/2021 0634 01/19/2021 1426 01/21/2021 1438 01/13/21 0733 01/14/21 1022  PROCALCITON  --  3.37  --   --   --   WBC 9.3  --   --  5.9 4.2  LATICACIDVEN  --   --  1.6  --   --     Liver Function Tests: Recent Labs  Lab 01/21/2021 0634 01/13/21 0524 01/14/21 1022 01/15/21 0400  AST 33 33 23  27  ALT 30 22 19 22   ALKPHOS 114 87 96 126  BILITOT 0.4 0.5 0.7 1.0  PROT 7.3 5.5* 4.7* 5.9*  ALBUMIN 3.8 3.0* 2.5* 2.9*   No results for input(s): LIPASE, AMYLASE in the last 168 hours. No results for input(s): AMMONIA in the last 168 hours.  ABG    Component Value Date/Time   PHART 7.380 02/01/2021 2326   PCO2ART 36.0 01/29/2021 2326   PO2ART 250 (H) 01/21/2021 2326   HCO3 21.4 01/29/2021 2326   TCO2 22 01/31/2021 2326   ACIDBASEDEF 3.0 (H) 01/21/2021 2326   O2SAT 100.0 02/04/2021 2326     Coagulation Profile: Recent Labs  Lab 01/15/2021 0634  INR 1.0    Cardiac Enzymes: Recent Labs  Lab 01/13/21 0524  CKTOTAL 702*    HbA1C: No results found for: HGBA1C  CBG: Recent Labs  Lab 01/14/21 1557 01/14/21 1911 01/14/21 2302 01/15/21 0313 01/15/21 0727  GLUCAP 98 122* 133* 125* 187*   The patient is critically ill due to respiratory failure, encephalopathy, status epilepticus.  Critical care was necessary to treat or prevent imminent or life-threatening deterioration.  Critical care was time spent personally by me on the following activities: development of treatment plan with patient and/or surrogate as well as nursing, discussions with consultants, evaluation of patient's response to treatment, examination of patient, obtaining history from patient or surrogate, ordering and performing treatments and interventions, ordering and review of laboratory studies, ordering and review of radiographic studies, pulse oximetry, re-evaluation of patient's condition and participation in multidisciplinary rounds.   Critical Care Time devoted to patient care services described in this note is 33 minutes. This time reflects time of care of this [place] . This critical care time does not reflect separately billable  procedures or procedure time, teaching time or supervisory time of PA/NP/Med student/Med Resident etc but could involve care discussion time.       Spero Geralds [place] [facility] 01/15/2021 11:08 AM  Pager: see AMION  If no response to pager , please call critical care on call (see AMION) until 7pm After 7:00 pm call Elink

## 2021-01-15 NOTE — Progress Notes (Signed)
Nutrition Follow-up  DOCUMENTATION CODES:   Underweight, Severe malnutrition in context of chronic illness  INTERVENTION:   Continue tube feeding via OG tube: - Advance Vital AF 1.2 by 10 ml q 8 hours to goal rate of 50 ml/hr (1200 ml/day)  Tube feeding regimen at goal provides 1440 kcal, 90 grams of protein, and 973 ml of H2O.  Recommend continuing to monitor magnesium, potassium, and phosphorus twice daily for at least 2 additional days, MD to replete as needed, as pt is at risk for refeeding syndrome given severe malnutrition.  - Continue MVI with minerals daily per tube  NUTRITION DIAGNOSIS:   Severe Malnutrition related to chronic illness (anxiety, depression) as evidenced by severe muscle depletion, severe fat depletion.  Ongoing, being addressed via TF  GOAL:   Patient will meet greater than or equal to 90% of their needs  Met via TF at goal  MONITOR:   Vent status, Labs, Weight trends, TF tolerance, I & O's  REASON FOR ASSESSMENT:   Ventilator, Consult Enteral/tube feeding initiation and management, Assessment of nutrition requirement/status  ASSESSMENT:   70 year old female who presented to the ED on 6/07 with seizures and stroke-like symptoms. PMH of seizures, GERD, anxiety, depression, insomnia, migraines, and ovarian cancer. Pt admitted with recurrent seizures with Todd's paralysis.  6/07 - intubated, burst suppression 6/09 - s/p lumbar puncture  Discussed pt with RN. Pt tolerating trickle tube feeds without issue. Discussed pt with CCM and okay to advance tube feeds to goal.  Pt currently refeeding with low magnesium and phosphorus which are being replaced. RD will write orders to advance tube feeds slowly to goal rate.  Patient is currently intubated on ventilator support MV: 7.9 L/min Temp (24hrs), Avg:98 F (36.7 C), Min:97.16 F (36.2 C), Max:99.3 F (37.4 C)  Drips: Ketamine Versed NS: 10 ml/hr  Medications reviewed and include: colace,  MVI with minerals daily, IV protonix, miralax, IV vimpat, IV keppra, IV magnesium sulfate 4 grams once, IV solu-medrol, IV phenobarbital, IV dilantin, IV potassium phosphate 15 mmol once  Labs reviewed: sodium 133, phosphorus 2.0 (being replaced), magnesium 1.5 (being replaced) CBG's: 98-187 x 24 hours  UOP: 4775 ml x 24 hours I/O's: +5.0 L since admit  Diet Order:   Diet Order             Diet NPO time specified  Diet effective now                   EDUCATION NEEDS:   No education needs have been identified at this time  Skin:  Skin Assessment: Reviewed RN Assessment  Last BM:  no documented BM  Height:   Ht Readings from Last 1 Encounters:  01/13/21 5' 3"  (1.6 m)    Weight:   Wt Readings from Last 1 Encounters:  01/14/21 43.5 kg    BMI:  Body mass index is 16.99 kg/m.  Estimated Nutritional Needs:   Kcal:  1300-1500  Protein:  65-85 grams  Fluid:  >/= 1.5 L/day    Gustavus Bryant, MS, RD, LDN Inpatient Clinical Dietitian Please see AMiON for contact information.

## 2021-01-15 NOTE — Progress Notes (Signed)
SLP Cancellation Note  Patient Details Name: Irena Gaydos MRN: [id number] DOB: Jun 19, 1951   Cancelled treatment:       Reason Eval/Treat Not Completed: Patient not medically ready. Will sign off and await new orders when appropriate.    Reah Justo, Katherene Ponto 01/15/2021, 7:39 AM

## 2021-01-15 NOTE — Progress Notes (Signed)
Occupational Therapy Discharge Patient Details Name: Samantha Atkins MRN: [id number] DOB: [birth year] Today's Date: 01/15/2021 Time:  -     Patient discharged from OT services secondary to medical decline - will need to re-order OT to resume therapy services. (versed and ketamine for seizure suprression righ/ is unarousable)  Please see latest therapy progress note for current level of functioning and progress toward goals.    Progress and discharge plan discussed with patient and/or caregiver: Patient unable to participate in discharge planning and no caregivers available  GO     Billey Chang, OTR/L  Acute Rehabilitation Services Pager: [phone] Office: [phone] .  01/15/2021, 8:06 AM

## 2021-01-15 NOTE — Progress Notes (Signed)
Pt transported to CT on vent and back to room 4N17. No complications, vitals are stable, RN at beside and RT will continue to monitor.

## 2021-01-15 NOTE — Progress Notes (Addendum)
Phenytoin Follow Up Consult Indication: IV maintenance dosing for focal seizures  Allergies  Allergen Reactions   Dust Mite Extract Cough    Patient Measurements: Height: _0  (160 cm) Weight: 43.5 kg (95 lb 14.4 oz) IBW/kg (Calculated) : 52.4   Body mass index is 16.99 kg/m.   Vital signs: Temp: 97.7 F (36.5 C) (06/10 0400) Temp Source: Oral (06/10 0400) BP: 125/74 (06/10 0700) Pulse Rate: 71 (06/10 0757)  Labs: Lab Results  Component Value Date/Time   Albumin 2.9 (L) 01/15/2021 0400   Albumin 2.5 (L) 01/14/2021 1022   Phenytoin Lvl 7.5 (L) 01/15/2021 0600   Lab Results  Component Value Date   PHENYTOIN 7.5 (L) 01/15/2021   Estimated Creatinine Clearance: 45.6 mL/min (by C-G formula based on SCr of 0.44 mg/dL).   Medications:  Medications Prior to Admission  Medication Sig Dispense Refill Last Dose   calcium citrate-vitamin D (CITRACAL+D) 315-200 MG-UNIT tablet Take 1 tablet by mouth 2 (two) times daily.   Past Week at Unknown time   Calcium-Magnesium-Zinc (CAL-MAG-ZINC PO) Take 1 tablet by mouth daily.   01/09/2021 at Unknown time   Cholecalciferol (VITAMIN D-3) 25 MCG (1000 UT) CAPS Take 1,000 Units by mouth daily.   Past Week at Unknown time   eletriptan (RELPAX) 20 MG tablet May repeat in 2 hours if headache persists or recurs. (Patient taking differently: Take 20 mg by mouth once as needed for migraine (and may repeat in 2 hours if headache persists or recurs).) 10 tablet 11 unk   ferrous sulfate 325 (65 FE) MG EC tablet Take 1 tablet (325 mg total) by mouth every other day. (Patient taking differently: Take 325 mg by mouth daily with breakfast.) 60 tablet 0 01/09/2021   FETZIMA 40 MG CP24 Take 1 capsule by mouth daily. (Patient taking differently: Take 40 mg by mouth daily.) 90 capsule 2 01/09/2021 at Unknown time   hydrOXYzine (VISTARIL) 25 MG capsule 2  qhs (Patient taking differently: Take 50 mg by mouth at bedtime.) 180 capsule 2 01/09/2021 at pm   ibuprofen  (ADVIL) 800 MG tablet Take 800 mg by mouth 2 (two) times daily.   01/09/2021 at Unknown time   levETIRAcetam (KEPPRA) 500 MG tablet Take 1 tablet twice a day (Patient taking differently: Take 500 mg by mouth 2 (two) times daily.) 180 tablet 3 01/09/2021 at pm   LORazepam (ATIVAN) 1 MG tablet Take 1 tablet (1 mg total) by mouth every 4 (four) hours as needed for anxiety. 30 tablet 0 unk   Multiple Vitamins-Minerals (CENTRUM SILVER 50+WOMEN) TABS Take 1 tablet by mouth daily with breakfast.   Past Week at Unknown time   PHENobarbital (LUMINAL) 64.8 MG tablet Take 1 tablet (64.8 mg total) by mouth 2 (two) times daily. 180 tablet 3 01/09/2021 at pm   potassium chloride SA (K-DUR,KLOR-CON) 20 MEQ tablet Take 20 mEq by mouth daily.   01/09/2021 at Unknown time   pregabalin (LYRICA) 150 MG capsule TAKE 1 CAPSULE BY MOUTH TWICE A DAY (Patient taking differently: Take 150 mg by mouth 2 (two) times daily.) 180 capsule 5 01/09/2021 at Unknown time   PRILOSEC OTC 20 MG tablet Take 20 mg by mouth daily before breakfast.   01/09/2021 at am   psyllium (HYDROCIL/METAMUCIL) 95 % PACK Take 2 packets by mouth daily.   Past Week at Unknown time   senna (SENOKOT) 8.6 MG tablet Take 1 tablet by mouth in the morning.   Past Week at Unknown time   traMADol (  ULTRAM) 50 MG tablet Take 50 mg by mouth every 6 (six) hours as needed (for pain).   unk   cholecalciferol (VITAMIN D) 1000 units tablet Take 1,000 Units by mouth daily. (Patient not taking: Reported on 01/16/2021)   Not Taking at Unknown time   feeding supplement (BOOST HIGH PROTEIN) LIQD Take 1 Container by mouth daily. (Patient not taking: Reported on 01/18/2021)   Not Taking at Unknown time   Multiple Vitamin (MULTIVITAMIN WITH MINERALS) TABS tablet Take 1 tablet by mouth daily. (Patient not taking: Reported on 02/03/2021)   Not Taking at Unknown time   omeprazole (PRILOSEC) 20 MG capsule Take 20 mg by mouth daily. (Patient not taking: Reported on 01/20/2021)   Not Taking at Unknown time    Potassium Chloride ER 20 MEQ TBCR Take 10 mEq by mouth daily. (Patient not taking: Reported on 01/13/2021)   Not Taking at Unknown time   senna-docusate (SENOKOT-S) 8.6-50 MG tablet Take 1 tablet by mouth at bedtime. (Patient not taking: Reported on 01/20/2021) 30 tablet 0 Not Taking at Unknown time     chlorhexidine gluconate (MEDLINE KIT)  15 mL Mouth Rinse BID   Chlorhexidine Gluconate Cloth  6 each Topical Daily   docusate  100 mg Per Tube BID   LORazepam  2 mg Intravenous Once   mouth rinse  15 mL Mouth Rinse 10 times per day   multivitamin with minerals  1 tablet Per Tube Daily   pantoprazole (PROTONIX) IV  40 mg Intravenous Q12H   perampanel  4 mg Oral QHS   polyethylene glycol  17 g Per Tube Daily   pregabalin  150 mg Per Tube BID      Assessment: 70 yo female presented in convulsive status epilepticus on presentation now 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 Patient is on versed 143m/hr (~2.3 mg/kg/hr) and ketamine 7.5 mg/kg/hr. Propofol off due to hypotension. Patient on keppra and phenobarbital prior to admission and doses increased. Perampanel and pregabalin added. Pharmacy consulted to dose phenytoin. Patient given fosphenytoin 248mkg loading dose 6/7 at 1330 and stated on phenytoin 15060mV q12h on 6/7 PM. Today is day 4 of phenytoin and phenytoin level obtained early to assess where patient is at given DDI with phenobarbital and clinical picture. Phenytoin level 7.5 and corrected for albumin 2.9 level is 11.03 (using micromedex calculator) which is therapeutic even with not being at steady state yet.   Corrected phenytoin level (if needed): 11.03 Seizure activity: nonconvulsive SE on EEG Significant potential drug interactions:  - phenytoin + phenobarbital (CYP inducers): increase or decrease concentrations of phenytoin - perampanel + phenobarbital: decrease level of perampenel   - perampanel + phenytoin: decrease level of perampenel - phenytoin + lacosamide: PR  interval prolongation   Goals of care:  Total phenytoin level: 10-20 mcg/ml Free phenytoin level: 1-2 mcg/ml  Plan:  Continue phenytoin 150m56m q12h - would not increase since already at goal and not at steady state yet due to Michaelis Menten kinetics and expect since not at steady state level to increase.  Repeat phenytoin level when closer to steady state - level on 6/12 (day 6) Pharmacy will continue to follow regarding obtaining total phenytoin levels and dose adjustments as indicated.  GracCristela FeltarmD Clinical Pharmacist  01/15/2021 8:54 AM

## 2021-01-15 NOTE — Procedures (Addendum)
Patient Name: Samantha Atkins  MRN: [id number]  Epilepsy Attending: Lora Havens  Referring Physician/Provider: Dr. Su Monks Duration: 01/14/2021 1541 to 01/15/2021 1541   Patient history: 70 year old female presented with focal convulsive status epilepticus.  EEG to evaluate for seizures.   Level of alertness: comatose   AEDs during EEG study: Keppra, phenobarbital, PHT, LCM, Perampanel, ketamine, versed   Technical aspects: This EEG study was done with scalp electrodes positioned according to the 10-20 International system of electrode placement. Electrical activity was acquired at a sampling rate of 500Hz  and reviewed with a high frequency filter of 70Hz  and a low frequency filter of 1Hz . EEG data were recorded continuously and digitally stored.   Description: EEG showed near continuous generalized and maximal right centro-temporo-parietal region polymorphic mixed frequencies with predominantly 2 to 3 Hz delta slowing and overriding 15-18hz  beta activity.  Lateralized periodic discharges with overriding fast activity were noted in right centro-temporo-parietal region at 1 to 1.5 Hz which gradually improved to once every 4-5 seconds. Intermittent brief periods of generalized EEG attenuation lasting 1 to 3 seconds were also noted.   ABNORMALITY -Continuous slow, generalized -EEG attenuation, generalized -Lateralized paretic discharges with overriding fast activity,right centro-temporo-parietal region. ( LPD+)   IMPRESSION: This study showed evidence of epileptogenicity arising from right centro-temporo-parietal region due to underlying structural abnormality with high potential for seizures. Additionally there is severe to profound diffuse encephalopathy likely related to seizures, sedation.     Samantha Atkins [name]

## 2021-01-15 NOTE — Progress Notes (Addendum)
Subjective: Had lumbar puncture yesterday which did not show any CSF pleocytosis.  Was started on IV Solu-Medrol.  Husband at bedside.   ROS: Unable to obtain due to poor mental status  Examination  Vital signs in last 24 hours: Temp:  [97.5 F (36.4 C)-99.3 F (37.4 C)] 97.7 F (36.5 C) (06/10 0400) Pulse Rate:  [70-98] 71 (06/10 0757) Resp:  [13-21] 16 (06/10 0757) BP: (115-161)/(70-89) 125/74 (06/10 0700) SpO2:  [93 %-98 %] 96 % (06/10 0700) FiO2 (%):  [40 %] 40 % (06/10 0757)  General: lying in bed, not in apparent distress CVS: pulse-normal rate and rhythm RS: Intubated, CTAB Extremities: normal, warm Neuro: On Versed at 100 mL/h, ketamine 7.5mg /kg/hr, comatose, does not open eyes to noxious stimuli, PERRLA, corneal reflex absent, gag reflex absent, does not withdraw to noxious stimuli in any extremity  Basic Metabolic Panel: Recent Labs  Lab 02/02/2021 0634 02/04/2021 0640 01/26/2021 1055 01/25/2021 1426 01/10/2021 2326 01/10/2021 2327 01/13/21 0524 01/14/21 1022 01/14/21 1620 01/15/21 0400  NA 137 136  --   --  137  --  139 139  --  133*  K 2.9* 2.8*  --   --  4.2 4.2 3.8 3.5  --  3.6  CL 99 102  --   --   --   --  108 110  --  102  CO2 14*  --   --   --   --   --  23 17*  --  16*  GLUCOSE 206* 204*  --   --   --   --  77 54*  --  131*  BUN 11 11  --   --   --   --  7* <5*  --  <5*  CREATININE 0.95 0.50  --   --   --   --  0.59 0.51  --  0.44  CALCIUM 8.7*  --   --   --   --   --  7.7* 6.8*  --  6.9*  MG  --   --  2.1  --   --   --  1.8 1.8 1.6* 1.5*  PHOS  --   --   --  1.9*  --   --  1.5* 2.0* 2.9 2.0*    CBC: Recent Labs  Lab 01/07/2021 0634 01/30/2021 0640 01/13/21 0733 01/13/21 1817 01/14/21 0222 01/14/21 1022 01/14/21 1620 01/15/21 0200  WBC 9.3  --  5.9  --   --  4.2  --   --   NEUTROABS 8.0*  --   --   --   --   --   --   --   HGB 15.2*   < > 10.6* 11.0* 10.6* 10.1* 10.9* 12.0  HCT 45.7   < > 31.8* 33.4* 32.6* 30.7* 33.7* 37.1  MCV 104.6*  --  105.3*  --    --  106.6*  --   --   PLT 433*  --  271  --   --  244  --   --    < > = values in this interval not displayed.     Coagulation Studies: No results for input(s): LABPROT, INR in the last 72 hours.  Imaging  CT head without contrast 01/14/2021: MRI findings are not appreciable by CT. There is atrophy and chronic small-vessel ischemic change of the brain. One could imagine that there is very minimal low-density of the right thalamus, correlated with the MRI findings of mild  T2 signal in that region.   The bifrontal and left posterior fossa subdural material is not visible by CT. Certainly there is no hyperdensity to suggest recent hemorrhage. As noted at MRI, empyema is not excluded and lumbar puncture should be considered.  ASSESSMENT AND PLAN: 70 year old female who presented with focal convulsive status epilepticus which was has been refractory to Versed, ketamine and multiple AEDs.    Focal status epilepticus, refractory -Unclear etiology of status epilepticus, no evidence of infection so far (UA clean, no growth on blood cultures, chest x-ray clear), suspect autoimmune versus neoplastic/paraneoplastic causes.  Lumbar puncture performed on 01/14/2021, no CSF pleocytosis, slightly elevated protein with normal glucose.   Recommendations -On ketamine at 7.5 mg mg/kg/hour, Versed 100 mL/h -We will increase perampanel to 6 mg nightly and pregabalin to 200 mg twice daily.   - Corrected Phenytoin level therapeutic at 11, will continue to 150mg  BID -Continue Keppra 2000 mg twice daily, phenobarbital 130 mg twice daily, Vimpat 100 mg twice daily - CT chest, abdomen pelvis with contrast for neoplastic work-up -Send CSF and serum autoimmune and paraneoplastic work-up to [facility] on 01/14/2021 - On Solu-Medrol 1000 mg for 5 days (01/18/2021 at 2300), fingerstick glucose and PPI while on high-dose steroids - Continue LTM EEG while we are adjusting medications -Last option will be pentobarbital, if  seizures persist -Management of rest of the comorbidities per primary team  ADDENDUM -No evidence of malignancy on CT C/A/P   CRITICAL CARE Performed by: Lora Havens     Total critical care time: 59minutes   Critical care time was exclusive of separately billable procedures and treating other patients.   Critical care was necessary to treat or prevent imminent or life-threatening deterioration.   Critical care was time spent personally by me on the following activities: development of treatment plan with patient and/or surrogate as well as nursing, discussions with consultants, evaluation of patient's response to treatment, examination of patient, obtaining history from patient or surrogate, ordering and performing treatments and interventions, ordering and review of laboratory studies, ordering and review of radiographic studies, pulse oximetry and re-evaluation of patient's condition.    Zeb Comfort Epilepsy [facility] For questions after 5pm please refer to AMION to reach the Neurologist on call

## 2021-01-15 NOTE — Progress Notes (Signed)
PT Cancellation Note  Patient Details Name: Samantha Atkins MRN: [id number] DOB: [birth year]   Cancelled Treatment:    Reason Eval/Treat Not Completed: Patient not medically ready at this time due to continued seizure activity and heavy medication to manage (versed and ketamine). PT will sign off at this time, please re-consult when medically appropriate.   Karma Ganja, PT, DPT   Acute Rehabilitation Department Pager #: [phone]   Otho Bellows 01/15/2021, 8:42 AM

## 2021-01-16 DIAGNOSIS — E876 Hypokalemia: Secondary | ICD-10-CM

## 2021-01-16 DIAGNOSIS — G40909 Epilepsy, unspecified, not intractable, without status epilepticus: Secondary | ICD-10-CM | POA: Diagnosis not present

## 2021-01-16 LAB — CBC
HCT: 36.8 % (ref 36.0–46.0)
Hemoglobin: 12.6 g/dL (ref 12.0–15.0)
MCH: 34.3 pg — ABNORMAL HIGH (ref 26.0–34.0)
MCHC: 34.2 g/dL (ref 30.0–36.0)
MCV: 100.3 fL — ABNORMAL HIGH (ref 80.0–100.0)
Platelets: 385 10*3/uL (ref 150–400)
RBC: 3.67 MIL/uL — ABNORMAL LOW (ref 3.87–5.11)
RDW: 12.6 % (ref 11.5–15.5)
WBC: 18.4 10*3/uL — ABNORMAL HIGH (ref 4.0–10.5)
nRBC: 0 % (ref 0.0–0.2)

## 2021-01-16 LAB — GLUCOSE, CAPILLARY
Glucose-Capillary: 211 mg/dL — ABNORMAL HIGH (ref 70–99)
Glucose-Capillary: 224 mg/dL — ABNORMAL HIGH (ref 70–99)
Glucose-Capillary: 229 mg/dL — ABNORMAL HIGH (ref 70–99)
Glucose-Capillary: 220 mg/dL — ABNORMAL HIGH (ref 70–99)
Glucose-Capillary: 65 mg/dL — ABNORMAL LOW (ref 70–99)
Glucose-Capillary: 67 mg/dL — ABNORMAL LOW (ref 70–99)
Glucose-Capillary: 177 mg/dL — ABNORMAL HIGH (ref 70–99)
Glucose-Capillary: 161 mg/dL — ABNORMAL HIGH (ref 70–99)

## 2021-01-16 LAB — MAGNESIUM: Magnesium: 2.1 mg/dL (ref 1.7–2.4)

## 2021-01-16 LAB — BASIC METABOLIC PANEL
Anion gap: 10 (ref 5–15)
BUN: 6 mg/dL — ABNORMAL LOW (ref 8–23)
CO2: 23 mmol/L (ref 22–32)
Calcium: 7.4 mg/dL — ABNORMAL LOW (ref 8.9–10.3)
Chloride: 103 mmol/L (ref 98–111)
Creatinine, Ser: 0.37 mg/dL — ABNORMAL LOW (ref 0.44–1.00)
GFR, Estimated: >60 mL/min (ref >60)
Glucose, Bld: 210 mg/dL — ABNORMAL HIGH (ref 70–99)
Potassium: 3.2 mmol/L — ABNORMAL LOW (ref 3.5–5.1)
Sodium: 136 mmol/L (ref 135–145)

## 2021-01-16 LAB — PHOSPHORUS: Phosphorus: <1 mg/dL — CL (ref 2.5–4.6)

## 2021-01-16 LAB — HEMOGLOBIN A1C
Hgb A1c MFr Bld: 5 % (ref 4.8–5.6)
Mean Plasma Glucose: 97 mg/dL

## 2021-01-16 LAB — TRIGLYCERIDES: Triglycerides: 68 mg/dL (ref <150)

## 2021-01-16 MED ORDER — POTASSIUM CHLORIDE 20 MEQ PO PACK
20.0000 meq | PACK | ORAL | Status: AC
  Administered 2021-01-16 (×2): 20 meq
  Filled 2021-01-16 (×2): qty 1

## 2021-01-16 MED ORDER — POTASSIUM PHOSPHATES 15 MMOLE/5ML IV SOLN
45.0000 mmol | Freq: Once | INTRAVENOUS | Status: AC
  Administered 2021-01-16: 45 mmol via INTRAVENOUS
  Filled 2021-01-16: qty 15

## 2021-01-16 MED ORDER — DEXTROSE 50 % IV SOLN
12.5000 g | INTRAVENOUS | Status: AC
  Administered 2021-01-16: 12.5 g via INTRAVENOUS
  Filled 2021-01-16: qty 50

## 2021-01-16 MED ORDER — POTASSIUM CHLORIDE 10 MEQ/50ML IV SOLN
10.0000 meq | INTRAVENOUS | Status: AC
  Administered 2021-01-16 (×2): 10 meq via INTRAVENOUS
  Filled 2021-01-16 (×2): qty 50

## 2021-01-16 NOTE — Progress Notes (Signed)
LTM maintenance completed; moved both Fp1 and Fp2 due to slight skin irritation. Tested event button.

## 2021-01-16 NOTE — Progress Notes (Signed)
NAME:  Samantha Atkins, MRN:  [id number], DOB:  [birth year], LOS: 4 ADMISSION DATE:  01/09/2021, CONSULTATION DATE:  01/08/2021 REFERRING MD:  Tamala Julian, CHIEF COMPLAINT:  Recurrent Seizures   History of Present Illness:  Samantha Atkins is a 70 y.o. female with medical history significant for  seizures, anxiety, depression, and ovarian cancer who presented for seizure.  History is obtained from the patient's husband is present at bedside.  Apparently around 7 PM last night patient began to have several episodes of nausea and vomiting throughout the night.  She last threw up around 5 AM.  Her husband had went to go get some water and other things and heard patient knocking over things on the nightstand.  When he went back in the room patient was having a full blown generalized tonic-clonic seizure for which she says that she was foaming at the mouth and not responding.  EMS was called and was witnessed having a 2-minute seizure with them as well and given Versed.  Patient husband notes that she has been having the intermittent episodes that last several minutes with difficulty following commands, changes in speech, weakness, and personality changes over the last month.  Previously had been seizure-free since 2019 prior to seizures restarting on 12/15/2020.  She had been followed by Dr. Delice Lesch who had set her up with 48-hour EEG monitoring and wanted to obtain a MRI of the brain with and without contrast and her Keppra has been increased to 500 mg twice daily recently on 5/20.  ED Course: Upon admission to the emergency department patient was seen as a code stroke.  CT scan of the head without contrast noted no acute abnormalities.  Neurology evaluated the patient and suspected symptoms were related to seizure and findings.  Afebrile, pulse 100-1 19, respirations 19-22, blood pressures 142/86-177/97, and O2 saturation maintained on room air.  Labs significant for hemoglobin 15.2, platelets 433, potassium 2.9, chloride 99, CO2  14, calcium 8.7, glucose 206, and anion gap 24.  Influenza and COVID-19 screening were negative.  CT angiogram of the head and neck with perfusion did not note any emergent large vessel occlusion, but did note ischemic CT perfusion values in the posterior right hemisphere no correlated arterial stenosis or occlusion.  Patient was given Keppra 1500 mg IV and continued on home regimen dose of Keppra and phenobarbital.  TR[facility] called to admit.originally, however EEG revealed active seizures and focal seizures  that did not resolve with Keppra x 2, Ativan 3 mg Phenytoin, and Phenobarbital. Pt is continuing to have L sided seizures. She is awake and currently protecting her airway.  Neurology asked that we admit to the ICU due to difficult to resolve seizures. Plan is for ICU monitoring , and if she decompensates, or mental status changes,  , or is no longer protecting her airway, intubation and seizure suppression.   Significant Hospital Events: Including procedures, antibiotic start and stop dates in addition to other pertinent events   01/14/2021 Admission to [facility]  02/04/2021 status epilepticus 6/8 ETT 6/9 had hyperkalemia, LP performed shows no infection 6/10 ongoing seizures noted on EEG  Interim History / Subjective:  Still having seizure despite  Large amounts of UOP - 5L in the last 24 hours.   Objective   Blood pressure (!) 163/82, pulse 85, temperature (!) 96.5 F (35.8 C), temperature source Axillary, resp. rate (!) 24, height 5\' 3"  (1.6 m), weight 43.5 kg, SpO2 99 %.    Vent Mode: PRVC FiO2 (%):  [  40 %] 40 % Set Rate:  [15 bmp] 15 bmp Vt Set:  [420 mL] 420 mL PEEP:  [5 cmH20] 5 cmH20 Plateau Pressure:  [14 cmH20-20 cmH20] 14 cmH20   Intake/Output Summary (Last 24 hours) at 01/16/2021 0951 Last data filed at 01/16/2021 0949 Gross per 24 hour  Intake 6349.4 ml  Output 5345 ml  Net 1004.4 ml   Filed Weights   02/04/2021 0738 01/13/21 0500 01/14/21 0500  Weight: 38.9 kg 39.6 kg 43.5 kg     Examination: General: critically ill, intubated, sedated HENT: ETT to vent, EEG leads in place Lungs: ctab no wheezes or crackles Cardiovascular: RRR, no murmurs Abdomen: soft, nontender nondistended Extremities: no edema Neuro: Sedated, not responsive to noxious stimuli, no gag  Labs/imaging that I havepersonally reviewed  (right click and "Reselect all SmartList Selections" daily)   CT Chest A/P reviewed - no signs of occult malignancy Na 136 K 3.2 Phos<1 WBC 18, Cr 0.37   Resolved Hospital Problem list     Assessment & Plan:  Status epilepticus Suspected Ab mediated encephalitis - work up thus far unremarkable.  CT Chest/A/P negative for occult malignancy.  Patient continues to have seizures based on continuous EEG despite being on multiple antiepileptic meds including Keppra, phenytoin, Vimpat, phenobarbital ketamine and Versed infusion Neurology is following, seizures improved somewhat after starting solumedrol.  Seizure Precautions Continuous EEG monitoring [facility]waiting [facility] encephalitis /paraneoplastic Ab panel. Consider other agents - IvIG, rituximab, cyclophosphamide pending panel.   Acute respiratory insufficiency due to status epilepticus requiring deep IV sedation Continue lung protective ventilation, VAP bundle SAT and SBT as tolerated - not appropriate for PS trial currently  Hypocalcemia, Hypokalemia, hypomagnesemia and hypophosphatemia Continue supplement electrolyte and monitor  Acute on chronic anemia Coffee ground emesis resolved Hgb stabilizing around 11-12, likely near baseline. Daily protonix Monitor H&H daily  Chronic HFpEF No signs of volume overload Monitor intake and output and daily weight  Cachexia Nutritionist following Continue tube feeds and supplement   Best practice (right click and "Reselect all SmartList Selections" daily)  Diet:  NPO tube feeds Pain/Anxiety/Delirium protocol (if indicated): Ketamine/Versed VAP  protocol (if indicated): Yes DVT prophylaxis: Subcutaneous Heparin GI prophylaxis: PPI Glucose control:  SSI Yes Central venous access: Yes, needed Arterial line:  N/A Foley:  N/A Mobility:  bed rest  PT consulted: N/A Last date of multidisciplinary goals of care discussion: 6/8, spoke with patient's husband, decided to continue with full aggressive care Code Status:  full code Disposition: ICU  The patient is critically ill due to status epilepticus, encephalopathy, respiratory failure.  Critical care was necessary to treat or prevent imminent or life-threatening deterioration.  Critical care was time spent personally by me on the following activities: development of treatment plan with patient and/or surrogate as well as nursing, discussions with consultants, evaluation of patient's response to treatment, examination of patient, obtaining history from patient or surrogate, ordering and performing treatments and interventions, ordering and review of laboratory studies, ordering and review of radiographic studies, pulse oximetry, re-evaluation of patient's condition and participation in multidisciplinary rounds.   Critical Care Time devoted to patient care services described in this note is 35 minutes. This time reflects time of care of this [place] . This critical care time does not reflect separately billable procedures or procedure time, teaching time or supervisory time of PA/NP/Med student/Med Resident etc but could involve care discussion time.       Leone [facility]  01/16/2021 9:51 AM  Pager: see AMION  If no response to pager , please call critical care on call (see AMION) until 7pm After 7:00 pm call Elink

## 2021-01-16 NOTE — Progress Notes (Signed)
Neurology Progress Note  Subjective:  NAEON Became hypotensive to SBP 80s briefly after phenytoin dose this AM but resolved quickly without intervention Day 2 of solumedrol for possible autoimmune encephalitis  EEG 6/10-6/11/22 -Continuous slow, generalized -Lateralized paretic discharges with overriding fast activity,right centro-temporo-parietal region. ( LPD+)  ROS: Unable to obtain due to poor mental status  Examination  Vital signs in last 24 hours: Temp:  [94.3 F (34.6 C)-98.8 F (37.1 C)] 98.3 F (36.8 C) (06/11 2000) Pulse Rate:  [75-105] 105 (06/11 2000) Resp:  [15-28] 22 (06/11 2000) BP: (97-178)/(57-105) 131/62 (06/11 2000) SpO2:  [94 %-100 %] 98 % (06/11 2000) FiO2 (%):  [40 %] 40 % (06/11 1948)  General: lying in bed, not in apparent distress CVS: pulse-normal rate and rhythm RS: Intubated, CTAB Extremities: normal, warm Neuro: On Versed at 100 mL/h, ketamine 7.5mg /kg/hr, comatose, does not open eyes to noxious stimuli, PERRLA, corneal reflex absent, gag reflex absent, does not withdraw to noxious stimuli in any extremity  Basic Metabolic Panel: Recent Labs  Lab 01/08/2021 0634 02/01/2021 0640 01/19/2021 1055 01/07/2021 2326 01/10/2021 2327 01/13/21 0524 01/14/21 1022 01/14/21 1620 01/15/21 0400 01/15/21 1700 01/16/21 0435  NA 137 136  --  137  --  139 139  --  133*  --  136  K 2.9* 2.8*  --  4.2 4.2 3.8 3.5  --  3.6  --  3.2*  CL 99 102  --   --   --  108 110  --  102  --  103  CO2 14*  --   --   --   --  23 17*  --  16*  --  23  GLUCOSE 206* 204*  --   --   --  77 54*  --  131*  --  210*  BUN 11 11  --   --   --  7* <5*  --  <5*  --  6*  CREATININE 0.95 0.50  --   --   --  0.59 0.51  --  0.44  --  0.37*  CALCIUM 8.7*  --   --   --   --  7.7* 6.8*  --  6.9*  --  7.4*  MG  --   --    < >  --   --  1.8 1.8 1.6* 1.5* 2.3 2.1  PHOS  --   --    < >  --   --  1.5* 2.0* 2.9 2.0* >30.0* <1.0*   < > = values in this interval not displayed.     CBC: Recent Labs   Lab 01/18/2021 0634 01/24/2021 0640 01/13/21 0733 01/13/21 1817 01/14/21 1022 01/14/21 1620 01/15/21 0200 01/15/21 1000 01/16/21 0435  WBC 9.3  --  5.9  --  4.2  --   --   --  18.4*  NEUTROABS 8.0*  --   --   --   --   --   --   --   --   HGB 15.2*   < > 10.6*   < > 10.1* 10.9* 12.0 11.9* 12.6  HCT 45.7   < > 31.8*   < > 30.7* 33.7* 37.1 35.8* 36.8  MCV 104.6*  --  105.3*  --  106.6*  --   --   --  100.3*  PLT 433*  --  271  --  244  --   --   --  385   < > = values in  this interval not displayed.      Coagulation Studies: No results for input(s): LABPROT, INR in the last 72 hours.  Imaging  CT head without contrast 01/14/2021: MRI findings are not appreciable by CT. There is atrophy and chronic small-vessel ischemic change of the brain. One could imagine that there is very minimal low-density of the right thalamus, correlated with the MRI findings of mild T2 signal in that region.   The bifrontal and left posterior fossa subdural material is not visible by CT. Certainly there is no hyperdensity to suggest recent hemorrhage. As noted at MRI, empyema is not excluded and lumbar puncture should be considered.  ASSESSMENT AND PLAN: 70 year old female who presented with focal convulsive status epilepticus which was has been refractory to Versed, ketamine and multiple AEDs.    Focal status epilepticus, refractory -Unclear etiology of status epilepticus, no evidence of infection so far (UA clean, no growth on blood cultures, chest x-ray clear), suspect autoimmune versus neoplastic/paraneoplastic causes.  Lumbar puncture performed on 01/14/2021, no CSF pleocytosis, slightly elevated protein with normal glucose. 6/11, EEG improved, mostly LPD+ no sz   Recommendations -On ketamine at 7.5 mg mg/kg/hour, Versed 100 mL/h. Consider sedation wean tmrw - Continue perampanel to 6 mg nightly - Continue pregabalin 200mg  bid - Corrected Phenytoin level therapeutic at 11, will continue to 150mg  BID.  She has not become hypotensive on this dose previously; will continue to monitor -Continue Keppra 2000 mg twice daily, phenobarbital 130 mg twice daily, Vimpat 100 mg twice daily - CT chest, abdomen pelvis with contrast for neoplastic work-up -Sent CSF and serum autoimmune and paraneoplastic work-up to [facility] on 01/14/2021 - On Solu-Medrol 1000 mg for 5 days (01/18/2021 at 2300), fingerstick glucose and PPI while on high-dose steroids - Continue LTM EEG while we are adjusting medications -Last option will be pentobarbital, if seizures persist -Management of rest of the comorbidities per primary team  -No evidence of malignancy on CT C/A/P   CRITICAL CARE Performed by: Su Monks MD     Total critical care time: 44minutes   Critical care time was exclusive of separately billable procedures and treating other patients.   Critical care was necessary to treat or prevent imminent or life-threatening deterioration.   Critical care was time spent personally by me on the following activities: development of treatment plan with patient and/or surrogate as well as nursing, discussions with consultants, evaluation of patient's response to treatment, examination of patient, obtaining history from patient or surrogate, ordering and performing treatments and interventions, ordering and review of laboratory studies, ordering and review of radiographic studies, pulse oximetry and re-evaluation of patient's condition.   Su Monks, MD [facility] [phone]  If 7pm- 7am, please page neurology on call as listed in [place].

## 2021-01-16 NOTE — Progress Notes (Signed)
Orthopedic Tech Progress Note Patient Details:  Samantha Atkins 06-01-1951 [id number]  Ortho Devices Type of Ortho Device: Prafo boot/shoe Ortho Device/Splint Location: Bilateral Ortho Device/Splint Interventions: Application, Ordered   Post Interventions Patient Tolerated: Well  Krissia Schreier A [name] 01/16/2021, 3:27 PM

## 2021-01-16 NOTE — Procedures (Addendum)
Patient Name: Samantha Atkins  MRN: [id number]  Epilepsy Attending: Lora Havens  Referring Physician/Provider: Dr. Su Monks Duration: 01/15/2021 1541 to 01/16/2021 1541   Patient history: 70 year old female presented with focal convulsive status epilepticus.  EEG to evaluate for seizures.   Level of alertness: comatose   AEDs during EEG study: Keppra, phenobarbital, PHT, LCM, Perampanel, ketamine, versed   Technical aspects: This EEG study was done with scalp electrodes positioned according to the 10-20 International system of electrode placement. Electrical activity was acquired at a sampling rate of 500Hz  and reviewed with a high frequency filter of 70Hz  and a low frequency filter of 1Hz . EEG data were recorded continuously and digitally stored.   Description: EEG showed continuous generalized and maximal right centro-temporo-parietal region polymorphic mixed frequencies with predominantly 2 to 3 Hz delta slowing and overriding 15-18hz  beta activity. Lateralized periodic discharges with overriding fast activity were noted in right centro-temporo-parietal region once every 4-5 seconds.    ABNORMALITY -Continuous slow, generalized -Lateralized paretic discharges with overriding fast activity,right centro-temporo-parietal region. ( LPD+)   IMPRESSION: This study showed evidence of epileptogenicity arising from right centro-temporo-parietal region due to underlying structural abnormality with high potential for seizures. Additionally there is severe diffuse encephalopathy likely related to seizures, sedation. No seizure were seen during this study.  EEG appears to be improving compared to previous day.    [name] [name]

## 2021-01-16 NOTE — Progress Notes (Signed)
[facility] Progress Note Patient Name: Aviah Sorci DOB: May 15, 1951 MRN: [id number]   Date of Service  01/16/2021  HPI/Events of Note  K+ 3.2, PHOS < 1.0, creat 0.37.  eICU Interventions  E-Link Adult Electrolyte Replacement Protocol ordered.        Kerry Kass Bjorn Hallas 01/16/2021, 5:54 AM

## 2021-01-17 ENCOUNTER — Inpatient Hospital Stay (HOSPITAL_COMMUNITY): Payer: Managed Care, Other (non HMO)

## 2021-01-17 DIAGNOSIS — L538 Other specified erythematous conditions: Secondary | ICD-10-CM | POA: Diagnosis not present

## 2021-01-17 DIAGNOSIS — M7989 Other specified soft tissue disorders: Secondary | ICD-10-CM | POA: Diagnosis not present

## 2021-01-17 LAB — COMPREHENSIVE METABOLIC PANEL
ALT: 24 U/L (ref 0–44)
AST: 19 U/L (ref 15–41)
Albumin: 2.2 g/dL — ABNORMAL LOW (ref 3.5–5.0)
Alkaline Phosphatase: 114 U/L (ref 38–126)
Anion gap: 7 (ref 5–15)
BUN: 7 mg/dL — ABNORMAL LOW (ref 8–23)
CO2: 22 mmol/L (ref 22–32)
Calcium: 7.1 mg/dL — ABNORMAL LOW (ref 8.9–10.3)
Chloride: 105 mmol/L (ref 98–111)
Creatinine, Ser: 0.44 mg/dL (ref 0.44–1.00)
GFR, Estimated: >60 mL/min (ref >60)
Glucose, Bld: 298 mg/dL — ABNORMAL HIGH (ref 70–99)
Potassium: 4.4 mmol/L (ref 3.5–5.1)
Sodium: 134 mmol/L — ABNORMAL LOW (ref 135–145)
Total Bilirubin: 0.5 mg/dL (ref 0.3–1.2)
Total Protein: 5.2 g/dL — ABNORMAL LOW (ref 6.5–8.1)

## 2021-01-17 LAB — CULTURE, BLOOD (ROUTINE X 2)
Culture: NO GROWTH
Culture: NO GROWTH
Special Requests: ADEQUATE
Special Requests: ADEQUATE

## 2021-01-17 LAB — PHENYTOIN LEVEL, TOTAL: Phenytoin Lvl: 8 ug/mL — ABNORMAL LOW (ref 10.0–20.0)

## 2021-01-17 LAB — CSF CULTURE W GRAM STAIN: Culture: NO GROWTH

## 2021-01-17 LAB — CBC
HCT: 33.8 % — ABNORMAL LOW (ref 36.0–46.0)
Hemoglobin: 11.5 g/dL — ABNORMAL LOW (ref 12.0–15.0)
MCH: 34.8 pg — ABNORMAL HIGH (ref 26.0–34.0)
MCHC: 34 g/dL (ref 30.0–36.0)
MCV: 102.4 fL — ABNORMAL HIGH (ref 80.0–100.0)
Platelets: 364 10*3/uL (ref 150–400)
RBC: 3.3 MIL/uL — ABNORMAL LOW (ref 3.87–5.11)
RDW: 13.2 % (ref 11.5–15.5)
WBC: 21.1 10*3/uL — ABNORMAL HIGH (ref 4.0–10.5)
nRBC: 0 % (ref 0.0–0.2)

## 2021-01-17 LAB — GLUCOSE, CAPILLARY
Glucose-Capillary: 286 mg/dL — ABNORMAL HIGH (ref 70–99)
Glucose-Capillary: 262 mg/dL — ABNORMAL HIGH (ref 70–99)
Glucose-Capillary: 176 mg/dL — ABNORMAL HIGH (ref 70–99)
Glucose-Capillary: 259 mg/dL — ABNORMAL HIGH (ref 70–99)
Glucose-Capillary: 157 mg/dL — ABNORMAL HIGH (ref 70–99)
Glucose-Capillary: 111 mg/dL — ABNORMAL HIGH (ref 70–99)

## 2021-01-17 LAB — PHENOBARBITAL LEVEL: Phenobarbital: 20.1 ug/mL (ref 15.0–30.0)

## 2021-01-17 LAB — PHOSPHORUS
Phosphorus: <1 mg/dL — CL (ref 2.5–4.6)
Phosphorus: 2.4 mg/dL — ABNORMAL LOW (ref 2.5–4.6)

## 2021-01-17 MED ORDER — PANTOPRAZOLE SODIUM 40 MG PO PACK
40.0000 mg | PACK | Freq: Every day | ORAL | Status: DC
  Administered 2021-01-17 – 2021-01-26 (×10): 40 mg
  Filled 2021-01-17 (×10): qty 20

## 2021-01-17 MED ORDER — K PHOS MONO-SOD PHOS DI & MONO 155-852-130 MG PO TABS
500.0000 mg | ORAL_TABLET | Freq: Two times a day (BID) | ORAL | Status: DC
  Administered 2021-01-17 (×3): 500 mg via NASOGASTRIC
  Filled 2021-01-17 (×4): qty 2

## 2021-01-17 MED ORDER — ACETAMINOPHEN 160 MG/5ML PO SOLN
650.0000 mg | ORAL | Status: DC | PRN
  Administered 2021-01-17 – 2021-01-26 (×10): 650 mg
  Filled 2021-01-17 (×11): qty 20.3

## 2021-01-17 MED ORDER — POTASSIUM PHOSPHATES 15 MMOLE/5ML IV SOLN: 30.0000 mmol | Freq: Once | INTRAVENOUS | Status: DC

## 2021-01-17 MED ORDER — SODIUM CHLORIDE 0.9 % IV SOLN
4.5000 mg/kg/h | INTRAVENOUS | Status: DC
  Administered 2021-01-17: 5.5 mg/kg/h via INTRAVENOUS
  Administered 2021-01-18: 4.5 mg/kg/h via INTRAVENOUS
  Filled 2021-01-17: qty 25

## 2021-01-17 MED ORDER — FUROSEMIDE 10 MG/ML IJ SOLN
40.0000 mg | Freq: Once | INTRAMUSCULAR | Status: AC
  Administered 2021-01-17: 40 mg via INTRAVENOUS
  Filled 2021-01-17: qty 4

## 2021-01-17 MED ORDER — POTASSIUM PHOSPHATES 15 MMOLE/5ML IV SOLN
30.0000 mmol | Freq: Once | INTRAVENOUS | Status: AC
  Administered 2021-01-17: 30 mmol via INTRAVENOUS
  Filled 2021-01-17: qty 10

## 2021-01-17 NOTE — Progress Notes (Signed)
Phenytoin Follow Up Consult Indication: IV maintenance dosing for focal seizures  Allergies  Allergen Reactions   Dust Mite Extract Cough    Patient Measurements: Height: 5' 3"  (160 cm) Weight: 43.5 kg (95 lb 14.4 oz) IBW/kg (Calculated) : 52.4   Body mass index is 16.99 kg/m.   Vital signs: Temp: 98.6 F (37 C) (06/12 0800) Temp Source: Oral (06/12 0800) BP: 152/86 (06/12 0900) Pulse Rate: 96 (06/12 0900)  Labs: Lab Results  Component Value Date/Time   Albumin 2.2 (L) 01/17/2021 0412   Phenytoin Lvl 8.0 (L) 01/17/2021 0412    Estimated Creatinine Clearance: 45.6 mL/min (by C-G formula based on SCr of 0.44 mg/dL).   Medications:  Medications Prior to Admission  Medication Sig Dispense Refill Last Dose   calcium citrate-vitamin D (CITRACAL+D) 315-200 MG-UNIT tablet Take 1 tablet by mouth 2 (two) times daily.   Past Week at Unknown time   Calcium-Magnesium-Zinc (CAL-MAG-ZINC PO) Take 1 tablet by mouth daily.   01/09/2021 at Unknown time   Cholecalciferol (VITAMIN D-3) 25 MCG (1000 UT) CAPS Take 1,000 Units by mouth daily.   Past Week at Unknown time   eletriptan (RELPAX) 20 MG tablet May repeat in 2 hours if headache persists or recurs. (Patient taking differently: Take 20 mg by mouth once as needed for migraine (and may repeat in 2 hours if headache persists or recurs).) 10 tablet 11 unk   ferrous sulfate 325 (65 FE) MG EC tablet Take 1 tablet (325 mg total) by mouth every other day. (Patient taking differently: Take 325 mg by mouth daily with breakfast.) 60 tablet 0 01/09/2021   FETZIMA 40 MG CP24 Take 1 capsule by mouth daily. (Patient taking differently: Take 40 mg by mouth daily.) 90 capsule 2 01/09/2021 at Unknown time   hydrOXYzine (VISTARIL) 25 MG capsule 2  qhs (Patient taking differently: Take 50 mg by mouth at bedtime.) 180 capsule 2 01/09/2021 at pm   ibuprofen (ADVIL) 800 MG tablet Take 800 mg by mouth 2 (two) times daily.   01/09/2021 at Unknown time   levETIRAcetam  (KEPPRA) 500 MG tablet Take 1 tablet twice a day (Patient taking differently: Take 500 mg by mouth 2 (two) times daily.) 180 tablet 3 01/09/2021 at pm   LORazepam (ATIVAN) 1 MG tablet Take 1 tablet (1 mg total) by mouth every 4 (four) hours as needed for anxiety. 30 tablet 0 unk   Multiple Vitamins-Minerals (CENTRUM SILVER 50+WOMEN) TABS Take 1 tablet by mouth daily with breakfast.   Past Week at Unknown time   PHENobarbital (LUMINAL) 64.8 MG tablet Take 1 tablet (64.8 mg total) by mouth 2 (two) times daily. 180 tablet 3 01/09/2021 at pm   potassium chloride SA (K-DUR,KLOR-CON) 20 MEQ tablet Take 20 mEq by mouth daily.   01/09/2021 at Unknown time   pregabalin (LYRICA) 150 MG capsule TAKE 1 CAPSULE BY MOUTH TWICE A DAY (Patient taking differently: Take 150 mg by mouth 2 (two) times daily.) 180 capsule 5 01/09/2021 at Unknown time   PRILOSEC OTC 20 MG tablet Take 20 mg by mouth daily before breakfast.   01/09/2021 at am   psyllium (HYDROCIL/METAMUCIL) 95 % PACK Take 2 packets by mouth daily.   Past Week at Unknown time   senna (SENOKOT) 8.6 MG tablet Take 1 tablet by mouth in the morning.   Past Week at Unknown time   traMADol (ULTRAM) 50 MG tablet Take 50 mg by mouth every 6 (six) hours as needed (for pain).   unk  cholecalciferol (VITAMIN D) 1000 units tablet Take 1,000 Units by mouth daily. (Patient not taking: Reported on 01/14/2021)   Not Taking at Unknown time   feeding supplement (BOOST HIGH PROTEIN) LIQD Take 1 Container by mouth daily. (Patient not taking: Reported on 01/20/2021)   Not Taking at Unknown time   Multiple Vitamin (MULTIVITAMIN WITH MINERALS) TABS tablet Take 1 tablet by mouth daily. (Patient not taking: Reported on 01/13/2021)   Not Taking at Unknown time   omeprazole (PRILOSEC) 20 MG capsule Take 20 mg by mouth daily. (Patient not taking: Reported on 01/07/2021)   Not Taking at Unknown time   Potassium Chloride ER 20 MEQ TBCR Take 10 mEq by mouth daily. (Patient not taking: Reported on 01/19/2021)    Not Taking at Unknown time   senna-docusate (SENOKOT-S) 8.6-50 MG tablet Take 1 tablet by mouth at bedtime. (Patient not taking: Reported on 01/18/2021) 30 tablet 0 Not Taking at Unknown time     chlorhexidine gluconate (MEDLINE KIT)  15 mL Mouth Rinse BID   Chlorhexidine Gluconate Cloth  6 each Topical Daily   docusate  100 mg Per Tube BID   heparin injection (subcutaneous)  5,000 Units Subcutaneous Q8H   insulin aspart  0-15 Units Subcutaneous Q4H   LORazepam  2 mg Intravenous Once   mouth rinse  15 mL Mouth Rinse 10 times per day   multivitamin with minerals  1 tablet Per Tube Daily   pantoprazole (PROTONIX) IV  40 mg Intravenous Q24H   perampanel  6 mg Per Tube QHS   phosphorus  500 mg Per NG tube BID   polyethylene glycol  17 g Per Tube Daily   pregabalin  200 mg Per Tube BID      Assessment: 70 yo female presented in convulsive status epilepticus on presentation now 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 Patient is on versed 131m/hr (~2.3 mg/kg/hr) and ketamine 7.5 mg/kg/hr. Propofol off due to hypotension. Patient on keppra and phenobarbital prior to admission and doses increased. Perampanel and pregabalin added. Pharmacy consulted to dose phenytoin. Patient given fosphenytoin 257mkg loading dose 6/7 at 1330 and started on phenytoin 15082mV q12h on 6/7 PM. 6/10 was day 4 of phenytoin and phenytoin level obtained early to assess where patient is at given DDI with phenobarbital and clinical picture. Phenytoin level 7.5 and corrected for albumin 2.9 level is 11.03 (using micromedex calculator) which is therapeutic even with not being at steady state yet.   Today is day 6 of phenytoin and the corrected steady-state phenytoin level is therapeutic at 14.8 (using micromedex calculator). Noted patient became hypotensive after phenytoin dose on 6/11 but resolved quickly without intervention. Also on phenobarbital and level is therapeutic at 20.1.  Corrected phenytoin level (if  needed): 14.8 Seizure activity: nonconvulsive SE on EEG improved Significant potential drug interactions:  - phenytoin + phenobarbital (CYP inducers): increase or decrease concentrations of phenytoin - perampanel + phenobarbital: decrease level of perampenel   - perampanel + phenytoin: decrease level of perampenel - phenytoin + lacosamide: PR interval prolongation  Goals of care:  Total phenytoin level: 10-20 mcg/ml Free phenytoin level: 1-2 mcg/ml  Plan:  Continue phenytoin 150m40m q12h  Consider repeat phenytoin level if medication changes are made and as clinically indicated Pharmacy will continue to follow regarding obtaining total phenytoin levels and dose adjustments as indicated  Thank you for involving pharmacy in this patient's care.  JennRenold GentaarmD, BCPS Clinical Pharmacist Clinical phone for 01/17/2021 until 3p is [phone]/2022  9:06 AM  **Pharmacist phone directory can be found on [email or web address] listed under [place]**

## 2021-01-17 NOTE — Procedures (Addendum)
Patient Name: Samantha Atkins  MRN: [id number]  Epilepsy Attending: Lora Havens  Referring Physician/Provider: Dr. Su Monks Duration: 01/16/2021 1541 to 01/17/2021 1541   Patient history: 70 year old female presented with focal convulsive status epilepticus.  EEG to evaluate for seizures.   Level of alertness: comatose   AEDs during EEG study: Keppra, phenobarbital, PHT, LCM, Perampanel, ketamine, versed   Technical aspects: This EEG study was done with scalp electrodes positioned according to the 10-20 International system of electrode placement. Electrical activity was acquired at a sampling rate of 500Hz  and reviewed with a high frequency filter of 70Hz  and a low frequency filter of 1Hz . EEG data were recorded continuously and digitally stored.   Description: EEG showed continuous generalized and maximal right centro-temporo-parietal region polymorphic mixed frequencies with predominantly 2 to 3 Hz delta slowing and overriding 15-18hz  beta activity. Rare polyspikes were noted in right centro-temporo-parietal region. Brief periods of generalized EEG attenuation lasting 1 to 3 seconds were also noted intermittently.    ABNORMALITY -Continuous slow, generalized -Polyspikes,right centro-temporo-parietal region. ( LPD+)   IMPRESSION: This study showed evidence of epileptogenicity arising from right centro-temporo-parietal region. Additionally there is severe diffuse encephalopathy likely related to seizures, sedation. No seizure were seen during this study.   EEG continues to be improving compared to previous day.    Samantha Atkins

## 2021-01-17 NOTE — Progress Notes (Signed)
Patient's 7pm blood sugar was 65. I initiated the icu hypoglycemic protocol order set. I notified CCM and administered dextrose 50% 12.5g. Patient's blood sugar 15 minutes after dextrose administration was 177.

## 2021-01-17 NOTE — Progress Notes (Signed)
VASCULAR LAB    Left upper extremity venous duplex has been performed.  See CV proc for preliminary results.   Shawndrea Rutkowski, RVT 01/17/2021, 6:09 PM

## 2021-01-17 NOTE — Progress Notes (Signed)
Neurology Progress Note  Subjective:  NAEON  Day 3 of solumedrol for possible autoimmune encephalitis  EEG 6/10-6/11/22 -Continuous slow, generalized -EEG w/ Polyspikes,right centro-temporo-parietal region. ( LPD+)  ROS: Unable to obtain due to poor mental status  Examination  Vital signs in last 24 hours: Temp:  [97 F (36.1 C)-100.3 F (37.9 C)] 98.3 F (36.8 C) (06/12 1600) Pulse Rate:  [84-105] 95 (06/12 1600) Resp:  [19-29] 25 (06/12 1600) BP: (109-199)/(56-100) 129/66 (06/12 1600) SpO2:  [94 %-98 %] 96 % (06/12 1600) FiO2 (%):  [40 %] 40 % (06/12 1600)  General: lying in bed, not in apparent distress CVS: pulse-normal rate and rhythm RS: Intubated, CTAB Extremities: normal, warm Neuro: On Versed at 100 mL/h, ketamine 7.5mg /kg/hr, comatose, does not open eyes to noxious stimuli, PERRLA, corneal reflex absent, gag reflex absent, does not withdraw to noxious stimuli in any extremity  Basic Metabolic Panel: Recent Labs  Lab 01/13/21 0524 01/14/21 1022 01/14/21 1620 01/15/21 0400 01/15/21 1700 01/16/21 0435 01/16/21 2350 01/17/21 0412 01/17/21 1046  NA 139 139  --  133*  --  136  --  134*  --   K 3.8 3.5  --  3.6  --  3.2*  --  4.4  --   CL 108 110  --  102  --  103  --  105  --   CO2 23 17*  --  16*  --  23  --  22  --   GLUCOSE 77 54*  --  131*  --  210*  --  298*  --   BUN 7* <5*  --  <5*  --  6*  --  7*  --   CREATININE 0.59 0.51  --  0.44  --  0.37*  --  0.44  --   CALCIUM 7.7* 6.8*  --  6.9*  --  7.4*  --  7.1*  --   MG 1.8 1.8 1.6* 1.5* 2.3 2.1  --   --   --   PHOS 1.5* 2.0* 2.9 2.0* >30.0* <1.0* <1.0*  --  2.4*     CBC: Recent Labs  Lab 01/25/2021 0634 01/16/2021 0640 01/13/21 0733 01/13/21 1817 01/14/21 1022 01/14/21 1620 01/15/21 0200 01/15/21 1000 01/16/21 0435 01/17/21 0412  WBC 9.3  --  5.9  --  4.2  --   --   --  18.4* 21.1*  NEUTROABS 8.0*  --   --   --   --   --   --   --   --   --   HGB 15.2*   < > 10.6*   < > 10.1* 10.9* 12.0  11.9* 12.6 11.5*  HCT 45.7   < > 31.8*   < > 30.7* 33.7* 37.1 35.8* 36.8 33.8*  MCV 104.6*  --  105.3*  --  106.6*  --   --   --  100.3* 102.4*  PLT 433*  --  271  --  244  --   --   --  385 364   < > = values in this interval not displayed.      Coagulation Studies: No results for input(s): LABPROT, INR in the last 72 hours.  Imaging  CT head without contrast 01/14/2021: MRI findings are not appreciable by CT. There is atrophy and chronic small-vessel ischemic change of the brain. One could imagine that there is very minimal low-density of the right thalamus, correlated with the MRI findings of mild T2 signal in that  region.   The bifrontal and left posterior fossa subdural material is not visible by CT. Certainly there is no hyperdensity to suggest recent hemorrhage. As noted at MRI, empyema is not excluded and lumbar puncture should be considered.  ASSESSMENT AND PLAN: 70 year old female who presented with focal convulsive status epilepticus which was has been refractory to Versed, ketamine and multiple AEDs.    Focal status epilepticus, refractory -Unclear etiology of status epilepticus, no evidence of infection so far (UA clean, no growth on blood cultures, chest x-ray clear), suspect autoimmune versus neoplastic/paraneoplastic causes.  Lumbar puncture performed on 01/14/2021, no CSF pleocytosis, slightly elevated protein with normal glucose. 6/11, EEG improved, mostly LPD+ no sz   Recommendations -Sedation wean today. Currently on ketamine at 7.5 mg mg/kg/hour, Versed 100 mL/h. Wean versed by 10 ml/hr and hold at 50 ml/hr, then wean ketamine at 1 mg/kg/hr then hold at 4 mg/kg/hr. - Continue perampanel to 6 mg nightly - Continue pregabalin 200mg  bid - Corrected Phenytoin level therapeutic at 11, will continue to 150mg  BID.  -Continue Keppra 2000 mg twice daily, phenobarbital 130 mg twice daily, Vimpat 100 mg twice daily - CT chest, abdomen pelvis with contrast for neoplastic  work-up -Sent CSF and serum autoimmune and paraneoplastic work-up to [facility] on 01/14/2021 - On Solu-Medrol 1000 mg for 5 days (01/18/2021 at 2300), fingerstick glucose and PPI while on high-dose steroids - Continue LTM EEG while we are adjusting medications -Last option will be pentobarbital, if seizures persist -Management of rest of the comorbidities per primary team  -No evidence of malignancy on CT C/A/P   CRITICAL CARE Performed by: Su Monks MD     Total critical care time: 19minutes   Critical care time was exclusive of separately billable procedures and treating other patients.   Critical care was necessary to treat or prevent imminent or life-threatening deterioration.   Critical care was time spent personally by me on the following activities: development of treatment plan with patient and/or surrogate as well as nursing, discussions with consultants, evaluation of patient's response to treatment, examination of patient, obtaining history from patient or surrogate, ordering and performing treatments and interventions, ordering and review of laboratory studies, ordering and review of radiographic studies, pulse oximetry and re-evaluation of patient's condition.   Su Monks, MD [facility] [phone]  If 7pm- 7am, please page neurology on call as listed in [place].

## 2021-01-17 NOTE — Progress Notes (Addendum)
eLink Physician-Brief Progress Note Patient Name: Janin Kozlowski DOB: 1950-09-06 MRN: [id number]   Date of Service  01/17/2021  HPI/Events of Note  T 100.3 F. Patient has ongoing non-convulsive seizures on EEG. HR and BP are normal. UOP has been excellent. Last cultured on 6/7 and those Bcx have been no growth. WBC 18k in setting of being started on methylprednisolone 1g daily yesterday.   eICU Interventions  Suspect fever of central etiology in setting of ongoing seizures. Treat with tylenol per tube Q4H PRN.  Will continue to monitor closely for signs/symptoms of infection, but at this point no compelling evidence.    ADDENDUM 01/17/21 1:17 AM  - Phos < 1.0. - Ordered 30 mmol Kphos IV as well as phos tabs per NG tube BID x2 days.  Intervention Category Intermediate Interventions: Other:  Charlott Rakes 01/17/2021, 12:37 AM

## 2021-01-17 NOTE — Progress Notes (Signed)
NAME:  Samantha Atkins, MRN:  [id number], DOB:  [birth year], LOS: 5 ADMISSION DATE:  01/29/2021, CONSULTATION DATE:  01/19/2021 REFERRING MD:  Tamala Julian, CHIEF COMPLAINT:  Recurrent Seizures   History of Present Illness:  Samantha Atkins is a 70 y.o. female with medical history significant for  seizures, anxiety, depression, and ovarian cancer who presented for seizure.  History is obtained from the patient's husband is present at bedside.  Apparently around 7 PM last night patient began to have several episodes of nausea and vomiting throughout the night.  She last threw up around 5 AM.  Her husband had went to go get some water and other things and heard patient knocking over things on the nightstand.  When he went back in the room patient was having a full blown generalized tonic-clonic seizure for which she says that she was foaming at the mouth and not responding.  EMS was called and was witnessed having a 2-minute seizure with them as well and given Versed.  Patient husband notes that she has been having the intermittent episodes that last several minutes with difficulty following commands, changes in speech, weakness, and personality changes over the last month.  Previously had been seizure-free since 2019 prior to seizures restarting on 12/15/2020.  She had been followed by Dr. Delice Lesch who had set her up with 48-hour EEG monitoring and wanted to obtain a MRI of the brain with and without contrast and her Keppra has been increased to 500 mg twice daily recently on 5/20.  ED Course: Upon admission to the emergency department patient was seen as a code stroke.  CT scan of the head without contrast noted no acute abnormalities.  Neurology evaluated the patient and suspected symptoms were related to seizure and findings.  Afebrile, pulse 100-1 19, respirations 19-22, blood pressures 142/86-177/97, and O2 saturation maintained on room air.  Labs significant for hemoglobin 15.2, platelets 433, potassium 2.9, chloride 99, CO2  14, calcium 8.7, glucose 206, and anion gap 24.  Influenza and COVID-19 screening were negative.  CT angiogram of the head and neck with perfusion did not note any emergent large vessel occlusion, but did note ischemic CT perfusion values in the posterior right hemisphere no correlated arterial stenosis or occlusion.  Patient was given Keppra 1500 mg IV and continued on home regimen dose of Keppra and phenobarbital.  TRH called to admit.originally, however EEG revealed active seizures and focal seizures  that did not resolve with Keppra x 2, Ativan 3 mg Phenytoin, and Phenobarbital. Pt is continuing to have L sided seizures. She is awake and currently protecting her airway.  Neurology asked that we admit to the ICU due to difficult to resolve seizures. Plan is for ICU monitoring , and if she decompensates, or mental status changes,  , or is no longer protecting her airway, intubation and seizure suppression.   Significant Hospital Events: Including procedures, antibiotic start and stop dates in addition to other pertinent events   01/25/2021 Admission to [facility]  01/31/2021 status epilepticus 6/8 ETT 6/9 had hyperkalemia, LP performed shows no infection 6/10 ongoing seizures noted on EEG 6/11 seizures improved with solumedrol  Interim History / Subjective:  Seizures improved after starting solumedrol. Left upper extremity swelling noted, possible IV infiltration.  Hypglycemc overnight Objective   Blood pressure (!) 152/86, pulse 96, temperature 98.6 F (37 C), temperature source Oral, resp. rate (!) 27, height 5\' 3"  (1.6 m), weight 43.5 kg, SpO2 97 %.    Vent Mode: PRVC FiO2 (%):  [  40 %] 40 % Set Rate:  [15 bmp] 15 bmp Vt Set:  [420 mL] 420 mL PEEP:  [5 cmH20] 5 cmH20 Plateau Pressure:  [15 cmH20] 15 cmH20   Intake/Output Summary (Last 24 hours) at 01/17/2021 1004 Last data filed at 01/17/2021 0900 Gross per 24 hour  Intake 6896.57 ml  Output 4985 ml  Net 1911.57 ml   Filed Weights   01/23/2021  0738 01/13/21 0500 01/14/21 0500  Weight: 38.9 kg 39.6 kg 43.5 kg    Examination: General: critically ill, intubated, sedated HENT: ETT to vent, EEG leads in place Lungs: ctab no wheezes or crackles Cardiovascular: RRR, no murmurs Abdomen: soft, nontender nondistended Extremities: left upper extremity swollen, pitting edema Neuro: Sedated, not responsive to noxious stimuli, no gag  Labs/imaging that I havepersonally reviewed  (right click and "Reselect all SmartList Selections" daily)    Na 134 K 4.4 Glucose 298, Cr 0.44, WBC 21, Hgb 11.5 Phos <1  Resolved Hospital Problem list     Assessment & Plan:  Status epilepticus Suspected Ab mediated encephalitis - work up thus far unremarkable.  CT Chest/A/P negative for occult malignancy.  Patient continues to have seizures based on continuous EEG despite being on multiple antiepileptic meds including Keppra, phenytoin, Vimpat, phenobarbital ketamine and Versed infusion Neurology is following, seizures improved somewhat after starting solumedrol. Will discuss de-escalation of AEDs with them.  Seizure Precautions Continuous EEG monitoring [facility]waiting [facility] encephalitis /paraneoplastic Ab panel. Consider other agents - IvIG, rituximab, cyclophosphamide pending panel.   Acute respiratory insufficiency due to status epilepticus requiring deep IV sedation Continue lung protective ventilation, VAP bundle SAT and SBT as tolerated - not appropriate for PS trial currently  Hypocalcemia, Hypokalemia, hypomagnesemia and hypophosphatemia Continue supplement electrolyte and monitor IV phos today   Acute on chronic anemia Coffee ground emesis resolved Hgb stabilizing around 11-12, likely near baseline. Daily protonix Monitor H&H daily  Chronic HFpEF No signs of volume overload Monitor intake and output and daily weight  Cachexia Nutritionist following Continue tube feeds and supplement  Leukocytosis  Likely in the setting of  steroids  LUE swelling  -concern for DVT, will obtain dopplers   Best practice (right click and "Reselect all SmartList Selections" daily)  Diet:  NPO tube feeds Pain/Anxiety/Delirium protocol (if indicated): Ketamine/Versed VAP protocol (if indicated): Yes DVT prophylaxis: Subcutaneous Heparin GI prophylaxis: PPI Glucose control:  SSI Yes - was hypoglycemic last night but now running high. Will monitor for goal <180 Central venous access: Yes, needed Arterial line:  N/A Foley:  N/A Mobility:  bed rest  PT consulted: N/A Last date of multidisciplinary goals of care discussion: 6/8, spoke with patient's husband, decided to continue with full aggressive care Code Status:  full code Disposition: ICU  The patient is critically ill due to respiratory failure, status epilepticux.  Critical care was necessary to treat or prevent imminent or life-threatening deterioration.  Critical care was time spent personally by me on the following activities: development of treatment plan with patient and/or surrogate as well as nursing, discussions with consultants, evaluation of patient's response to treatment, examination of patient, obtaining history from patient or surrogate, ordering and performing treatments and interventions, ordering and review of laboratory studies, ordering and review of radiographic studies, pulse oximetry, re-evaluation of patient's condition and participation in multidisciplinary rounds.   Critical Care Time devoted to patient care services described in this note is 33 minutes. This time reflects time of care of this [place] . This critical care  time does not reflect separately billable procedures or procedure time, teaching time or supervisory time of PA/NP/Med student/Med Resident etc but could involve care discussion time.       Spero Geralds Aguada Pulmonary and Critical Care Medicine 01/17/2021 10:04 AM  Pager: see AMION  If no response to pager , please  call critical care on call (see AMION) until 7pm After 7:00 pm call [place]

## 2021-01-17 NOTE — Progress Notes (Signed)
Maint complete. 

## 2021-01-18 DIAGNOSIS — G40909 Epilepsy, unspecified, not intractable, without status epilepticus: Secondary | ICD-10-CM | POA: Diagnosis not present

## 2021-01-18 LAB — CBC
HCT: 33.5 % — ABNORMAL LOW (ref 36.0–46.0)
Hemoglobin: 11.4 g/dL — ABNORMAL LOW (ref 12.0–15.0)
MCH: 34.9 pg — ABNORMAL HIGH (ref 26.0–34.0)
MCHC: 34 g/dL (ref 30.0–36.0)
MCV: 102.4 fL — ABNORMAL HIGH (ref 80.0–100.0)
Platelets: 320 10*3/uL (ref 150–400)
RBC: 3.27 MIL/uL — ABNORMAL LOW (ref 3.87–5.11)
RDW: 13.3 % (ref 11.5–15.5)
WBC: 18.9 10*3/uL — ABNORMAL HIGH (ref 4.0–10.5)
nRBC: 0 % (ref 0.0–0.2)

## 2021-01-18 LAB — GLUCOSE, CAPILLARY
Glucose-Capillary: 140 mg/dL — ABNORMAL HIGH (ref 70–99)
Glucose-Capillary: 178 mg/dL — ABNORMAL HIGH (ref 70–99)
Glucose-Capillary: 199 mg/dL — ABNORMAL HIGH (ref 70–99)
Glucose-Capillary: 199 mg/dL — ABNORMAL HIGH (ref 70–99)
Glucose-Capillary: 226 mg/dL — ABNORMAL HIGH (ref 70–99)
Glucose-Capillary: 226 mg/dL — ABNORMAL HIGH (ref 70–99)

## 2021-01-18 LAB — BASIC METABOLIC PANEL
Anion gap: 9 (ref 5–15)
BUN: 15 mg/dL (ref 8–23)
CO2: 25 mmol/L (ref 22–32)
Calcium: 7.3 mg/dL — ABNORMAL LOW (ref 8.9–10.3)
Chloride: 101 mmol/L (ref 98–111)
Creatinine, Ser: 0.43 mg/dL — ABNORMAL LOW (ref 0.44–1.00)
GFR, Estimated: >60 mL/min (ref >60)
Glucose, Bld: 219 mg/dL — ABNORMAL HIGH (ref 70–99)
Potassium: 3.6 mmol/L (ref 3.5–5.1)
Sodium: 135 mmol/L (ref 135–145)

## 2021-01-18 LAB — MISC LABCORP TEST (SEND OUT)
Labcorp test code: 808970
Labcorp test code: 9985

## 2021-01-18 MED ORDER — K PHOS MONO-SOD PHOS DI & MONO 155-852-130 MG PO TABS
500.0000 mg | ORAL_TABLET | Freq: Two times a day (BID) | ORAL | Status: AC
  Administered 2021-01-18: 500 mg
  Filled 2021-01-18: qty 2

## 2021-01-18 MED ORDER — PERAMPANEL 2 MG PO TABS
8.0000 mg | ORAL_TABLET | Freq: Every day | ORAL | Status: DC
  Administered 2021-01-18 – 2021-01-26 (×9): 8 mg
  Filled 2021-01-18 (×9): qty 4

## 2021-01-18 MED ORDER — LEVETIRACETAM 100 MG/ML PO SOLN
2000.0000 mg | Freq: Two times a day (BID) | ORAL | Status: DC
  Administered 2021-01-18 – 2021-01-25 (×15): 2000 mg
  Filled 2021-01-18 (×15): qty 20

## 2021-01-18 MED ORDER — LACOSAMIDE 10 MG/ML PO SOLN
100.0000 mg | Freq: Two times a day (BID) | ORAL | Status: DC
  Administered 2021-01-18 – 2021-01-26 (×18): 100 mg
  Filled 2021-01-18 (×18): qty 12

## 2021-01-18 MED ORDER — SODIUM CHLORIDE 0.9 % IV SOLN
160.0000 mg | Freq: Two times a day (BID) | INTRAVENOUS | Status: DC
  Administered 2021-01-18 – 2021-01-21 (×6): 160 mg via INTRAVENOUS
  Filled 2021-01-18 (×8): qty 1.23

## 2021-01-18 NOTE — Progress Notes (Addendum)
Ongoing progress note for weaning off of ketamine and versed for burst suppression:   VERSED (started at 50 mg/hr): 0900: versed 25 mg/hr - no seizure like activity, unresponsive 1100: versed OFF - no seizure like activity, unresponsive   KETAMINE: (started at 4.5 mg/kg/hr) 1200: ketamine 3 mg/hr - no seizure like activity, unresponsive 1300: ketamine 2 mg/hr - no seizure like activity, unresponsive 1400: ketamine 1 mg/hr - no seizure like activity, unresponsive  1500: ketamine OFF

## 2021-01-18 NOTE — Procedures (Signed)
Patient Name: Samantha Atkins  MRN: [id number]  Epilepsy Attending: Lora Havens  Referring Physician/Provider: Dr. Su Monks Duration: 01/17/2021 1541 to 01/18/2021 1541   Patient history: 70 year old female presented with focal convulsive status epilepticus.  EEG to evaluate for seizures.   Level of alertness: comatose   AEDs during EEG study: Keppra, phenobarbital, PHT, LCM, Perampanel, ketamine, versed   Technical aspects: This EEG study was done with scalp electrodes positioned according to the 10-20 International system of electrode placement. Electrical activity was acquired at a sampling rate of 500Hz  and reviewed with a high frequency filter of 70Hz  and a low frequency filter of 1Hz . EEG data were recorded continuously and digitally stored.   Description: EEG initially showed continuous generalized and maximal right centro-temporo-parietal region polymorphic mixed frequencies with predominantly 2 to 3 Hz delta slowing and overriding 15-18hz  beta activity. Rare polyspikes were noted in right centro-temporo-parietal region. Brief periods of generalized EEG attenuation lasting 1 to 3 seconds were also noted intermittently.  As sedation was weaned, EEG showed lateralized periodic epileptiform discharges in right hemisphere every 4 seconds. Frequent left hemispheric, maximal left frontotemporal region spikes were also noted   ABNORMALITY - Continuous slow, generalized nd maximal right centro-temporo-parietal region - Lateralized periodic discharges,right hemisphere - Spike, left hemisphere, maximal left frontotemporal region   IMPRESSION: This study showed evidence of epileptogenicity and cortical dysfunction arising from right hemisphere.  As sedation was weaned, independent epileptogenicity was also noted in left hemisphere, maximal left frontotemporal region. Additionally there is severe diffuse encephalopathy likely related to seizures, sedation. No seizure were seen during this  study.   Gianny Sabino [name]

## 2021-01-18 NOTE — Procedures (Signed)
ELECTROENCEPHALOGRAM REPORT  Date of Study: 12/23/2020  Patient's Name: Samantha Atkins MRN: [id number] Date of Birth: 31-Dec-1950  Referring Provider: Dr. Ellouise Newer  Clinical History: This is a 70 year old woman with a history of status epilepticus in 2019, seizure-free until recently when she called to report daily seizures. EEG for classification.  Medications: Lyrica Phenobarbital Keppra Fetzima Relpax  Technical Summary: A multichannel digital 1-hour EEG recording measured by the international 10-20 system with electrodes applied with paste and impedances below 5000 ohms performed as portable with EKG monitoring in an awake and asleep patient.  Hyperventilation was not performed. Photic stimulation was performed.  The digital EEG was referentially recorded, reformatted, and digitally filtered in a variety of bipolar and referential montages for optimal display.   Description: The patient is awake and asleep during the recording.  During maximal wakefulness, there is a symmetric, medium to high voltage 8 Hz posterior dominant rhythm that attenuates with eye opening. This is admixed with a small amount of diffuse 4-5 Hz theta and 2-3 Hz delta slowing with overriding beta activity seen. There is an excess amount of diffuse medium to high voltage beta activity. During drowsiness and sleep, there is an increase in theta and delta slowing of the background with vertex waves seen.  Photic stimulation did not elicit any abnormalities.  There were no clear epileptiform discharges or electrographic seizures seen.    EKG lead was unremarkable.  Impression: This 1-hour awake and asleep EEG is abnormal due to mild diffuse background slowing with medium to high voltage excess beta activity.    Clinical Correlation of the above findings indicates diffuse cerebral dysfunction that is non-specific in etiology and can be seen with hypoxic/ischemic injury, toxic/metabolic encephalopathies,  neurodegenerative disorders, or medication effect. Excess beta activity is typically seen in the presence of sedating medications, barbiturates. The absence of epileptiform discharges does not rule out a clinical diagnosis of epilepsy.  Clinical correlation is advised.   Ellouise Newer, M.D.

## 2021-01-18 NOTE — Progress Notes (Deleted)
Patient's per tube vimpat due at 1000 but I have not received the medication from pharmacy. I have messaged pharmacy x2. I will give to patient as soon as I get it.

## 2021-01-18 NOTE — Progress Notes (Signed)
NAME:  Samantha Atkins, MRN:  [id number], DOB:  12/09/1950, LOS: 6 ADMISSION DATE:  01/11/2021, CONSULTATION DATE:  01/21/2021 REFERRING MD:  Tamala Julian, CHIEF COMPLAINT:  Recurrent Seizures   History of Present Illness:  Samantha Atkins is a 69 y.o. female with medical history significant for  seizures, anxiety, depression, and ovarian cancer who presented for seizure.  History is obtained from the patient's husband is present at bedside.  Apparently around 7 PM last night patient began to have several episodes of nausea and vomiting throughout the night.  She last threw up around 5 AM.  Her husband had went to go get some water and other things and heard patient knocking over things on the nightstand.  When he went back in the room patient was having a full blown generalized tonic-clonic seizure for which she says that she was foaming at the mouth and not responding.  EMS was called and was witnessed having a 2-minute seizure with them as well and given Versed.  Patient husband notes that she has been having the intermittent episodes that last several minutes with difficulty following commands, changes in speech, weakness, and personality changes over the last month.  Previously had been seizure-free since 2019 prior to seizures restarting on 12/15/2020.  She had been followed by Dr. Delice Lesch who had set her up with 48-hour EEG monitoring and wanted to obtain a MRI of the brain with and without contrast and her Keppra has been increased to 500 mg twice daily recently on 5/20.  ED Course: Upon admission to the emergency department patient was seen as a code stroke.  CT scan of the head without contrast noted no acute abnormalities.  Neurology evaluated the patient and suspected symptoms were related to seizure and findings.  Afebrile,.  Influenza and COVID-19 screening were negative.  CT angiogram of the head and neck with perfusion did not note any emergent large vessel occlusion, but did note ischemic CT perfusion values in  the posterior right hemisphere no correlated arterial stenosis or occlusion.  Patient was given Keppra 1500 mg IV and continued on home regimen dose of Keppra and phenobarbital.  TRH called to admit.originally, however EEG revealed active seizures and focal seizures  that did not resolve with Keppra x 2, Ativan 3 mg Phenytoin, and Phenobarbital. Pt is continuing to have L sided seizures.   Significant Hospital Events: Including procedures, antibiotic start and stop dates in addition to other pertinent events   01/13/2021 Admission to [facility]  01/15/2021 status epilepticus 6/8 ETT 6/9 had hyperkalemia, LP performed shows no infection 6/10 ongoing seizures noted on EEG Suspected Ab mediated encephalitis - work up thus far unremarkable.  CT Chest/A/P negative for occult malignancy.  6/11 seizures improved with solumedrol 6/13 commenced sedation weaning.   Interim History / Subjective:  No seizures overnight.   Objective   Blood pressure (!) 162/82, pulse 95, temperature 98.4 F (36.9 C), temperature source Axillary, resp. rate (!) 22, height 5\' 3"  (1.6 m), weight 44.6 kg, SpO2 97 %.    Vent Mode: PRVC FiO2 (%):  [40 %] 40 % Set Rate:  [15 bmp] 15 bmp Vt Set:  [420 mL] 420 mL PEEP:  [5 cmH20] 5 cmH20 Plateau Pressure:  [13 cmH20-15 cmH20] 13 cmH20   Intake/Output Summary (Last 24 hours) at 01/18/2021 1100 Last data filed at 01/18/2021 1000 Gross per 24 hour  Intake 4633.32 ml  Output 8675 ml  Net -4041.68 ml    Filed Weights   01/13/21 0500  01/14/21 0500 01/18/21 0453  Weight: 39.6 kg 43.5 kg 44.6 kg    Examination: General: critically ill, intubated, sedated HENT: ETT to vent, EEG leads in place Lungs: ctab no wheezes or crackles Cardiovascular: RRR, no murmurs Abdomen: soft, nontender nondistended Extremities: Marked muscle waisting.  Neuro: Sedated, not responsive to noxious stimuli, no gag  Labs/imaging that I havepersonally reviewed  (right click and "Reselect all SmartList  Selections" daily)   Bmet unremarkable. Leukocytosis secondary to high dose steroids.   Resolved Hospital Problem list     Assessment & Plan:  Critically ill due to Status epilepticus requiring titration of midazolam and ketamine to maintain burst suppression Possible autoimmune encephalitis Acute respiratory insufficiency due to status epilepticus requiring deep IV sedation Chronic HFpEF Cachexia Leukocytosis  LUE swelling   Plan:  - Wean sedative infusions - Continue current anticonvulsants - Neurology to prescribe steroid taper - Follow up on [facility] encephalitis /paraneoplastic Ab panel.  - Continue feeding at goal.     Best practice (right click and "Reselect all SmartList Selections" daily)  Diet:  NPO tube feeds Pain/Anxiety/Delirium protocol (if indicated): Ketamine/Versed VAP protocol (if indicated): Yes DVT prophylaxis: Subcutaneous Heparin GI prophylaxis: PPI Glucose control:  SSI Yes - was hypoglycemic last night but now running high. Will monitor for goal <180 Central venous access: Yes, needed -  will likely remove once off sedative infusions.  Arterial line:  N/A Foley:  N/A - transition to external catheter Mobility:  bed rest  PT consulted: N/A Last date of multidisciplinary goals of care discussion: 6/8, spoke with patient's husband, decided to continue with full aggressive care Code Status:  full code Disposition: ICU  The patient is critically ill due to respiratory failure, status epilepticus.  Critical care was necessary to treat or prevent imminent or life-threatening deterioration.  Critical care was time spent personally by me on the following activities: development of treatment plan with patient and/or surrogate as well as nursing, discussions with consultants, evaluation of patient's response to treatment, examination of patient, obtaining history from patient or surrogate, ordering and performing treatments and interventions, ordering and  review of laboratory studies, ordering and review of radiographic studies, pulse oximetry, re-evaluation of patient's condition and participation in multidisciplinary rounds.   Critical Care Time devoted to patient care services described in this note is 40 minutes. This time reflects time of care of this [place] . This critical care time does not reflect separately billable procedures or procedure time, teaching time or supervisory time of PA/NP/Med student/Med Resident etc but could involve care discussion time.       Kipp Brood [place] [facility] 01/18/2021 11:00 AM  Pager: see AMION  If no response to pager , please call critical care on call (see AMION) until 7pm After 7:00 pm call Elink

## 2021-01-18 NOTE — Progress Notes (Signed)
Subjective: No clinical seizures overnight.  Husband at bedside.  Husband states when patient had similar episode few years ago, after being discharged from the hospital she mentioned that she wished she would have died.  Husband became tearful and mentioned that patient values her independence and mobility and if she is not able to do that, she would not want to continue with life-sustaining measures   ROS: Unable to obtain due to poor mental status  Examination  Vital signs in last 24 hours: Temp:  [98.3 F (36.8 C)-100.4 F (38 C)] 98.7 F (37.1 C) (06/13 1200) Pulse Rate:  [90-110] 106 (06/13 1500) Resp:  [22-31] 30 (06/13 1500) BP: (127-177)/(65-93) 159/73 (06/13 1500) SpO2:  [93 %-98 %] 97 % (06/13 1500) FiO2 (%):  [40 %] 40 % (06/13 1533) Weight:  [44.6 kg] 44.6 kg (06/13 0453)  General: lying in bed, not in apparent distress CVS: pulse-normal rate and rhythm RS: Intubated, CTAB Extremities: normal, warm Neuro: On ketamine 4.5mg /kg/hr, opens eyes once to noxious stimuli briefly, did not follow commands, PERRLA, corneal reflex absent, gag reflex absent.  Did not assess movement with noxious stimulation today as patient was opening eyes and husband was tearful  Basic Metabolic Panel: Recent Labs  Lab 01/14/21 1022 01/14/21 1620 01/15/21 0400 01/15/21 1700 01/16/21 0435 01/16/21 2350 01/17/21 0412 01/17/21 1046 01/18/21 0343  NA 139  --  133*  --  136  --  134*  --  135  K 3.5  --  3.6  --  3.2*  --  4.4  --  3.6  CL 110  --  102  --  103  --  105  --  101  CO2 17*  --  16*  --  23  --  22  --  25  GLUCOSE 54*  --  131*  --  210*  --  298*  --  219*  BUN <5*  --  <5*  --  6*  --  7*  --  15  CREATININE 0.51  --  0.44  --  0.37*  --  0.44  --  0.43*  CALCIUM 6.8*  --  6.9*  --  7.4*  --  7.1*  --  7.3*  MG 1.8 1.6* 1.5* 2.3 2.1  --   --   --   --   PHOS 2.0* 2.9 2.0* >30.0* <1.0* <1.0*  --  2.4*  --     CBC: Recent Labs  Lab 01/11/2021 0634 01/08/2021 0640  01/13/21 0733 01/13/21 1817 01/14/21 1022 01/14/21 1620 01/15/21 0200 01/15/21 1000 01/16/21 0435 01/17/21 0412 01/18/21 0343  WBC 9.3  --  5.9  --  4.2  --   --   --  18.4* 21.1* 18.9*  NEUTROABS 8.0*  --   --   --   --   --   --   --   --   --   --   HGB 15.2*   < > 10.6*   < > 10.1*   < > 12.0 11.9* 12.6 11.5* 11.4*  HCT 45.7   < > 31.8*   < > 30.7*   < > 37.1 35.8* 36.8 33.8* 33.5*  MCV 104.6*  --  105.3*  --  106.6*  --   --   --  100.3* 102.4* 102.4*  PLT 433*  --  271  --  244  --   --   --  385 364 320   < > = values  in this interval not displayed.     Coagulation Studies: No results for input(s): LABPROT, INR in the last 72 hours.  Imaging No new brain imaging overnight  ASSESSMENT AND PLAN: 70 year old female who presented with focal convulsive status epilepticus which was refractory to Versed, ketamine and multiple AEDs.  Lumbar puncture was performed on 01/14/2021, no CSF pleocytosis was seen, CSF and serum was sent to [facility] for autoimmune and paraneoplastic work-up and patient was started on IV Solu-Medrol 1000 mg after which EEG showed significant improvement.   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     Recommendations -On ketamine at 4.5 mg mg/kg/hour, Versed 50 mL/h.  No seizures overnight.  Therefore we will plan to wean Versed to stop followed by weaning ketamine to stop -We will increase perampanel to 8 mg nightly and phenobarbital to 160 mg twice daily -If EEG worsens, can consider adding Onfi -Continue phenytoin 150mg  BID, Keppra 2000 mg twice daily, Vimpat 100 mg twice daily - On Solu-Medrol 1000 mg for 5 days (01/18/2021 at 2300), fingerstick glucose and PPI while on high-dose steroids.  We will start patient on prednisone 60 mg daily tomorrow - Continue LTM EEG while we are adjusting medications -Discussed current neurologic exam and EEG  findings as well as plan with patient's husband at bedside -Management of rest of the comorbidities per primary team  CRITICAL CARE Performed by: Lora Havens   Total critical care time: 35 minutes  Critical care time was exclusive of separately billable procedures and treating other patients.  Critical care was necessary to treat or prevent imminent or life-threatening deterioration.  Critical care was time spent personally by me on the following activities: development of treatment plan with patient and/or surrogate as well as nursing, discussions with consultants, evaluation of patient's response to treatment, examination of patient, obtaining history from patient or surrogate, ordering and performing treatments and interventions, ordering and review of laboratory studies, ordering and review of radiographic studies, pulse oximetry and re-evaluation of patient's condition.  Zeb Comfort Epilepsy [facility] For questions after 5pm please refer to AMION to reach the Neurologist on call

## 2021-01-19 DIAGNOSIS — G40909 Epilepsy, unspecified, not intractable, without status epilepticus: Secondary | ICD-10-CM | POA: Diagnosis not present

## 2021-01-19 LAB — GLUCOSE, CAPILLARY
Glucose-Capillary: 245 mg/dL — ABNORMAL HIGH (ref 70–99)
Glucose-Capillary: 228 mg/dL — ABNORMAL HIGH (ref 70–99)
Glucose-Capillary: 197 mg/dL — ABNORMAL HIGH (ref 70–99)
Glucose-Capillary: 150 mg/dL — ABNORMAL HIGH (ref 70–99)
Glucose-Capillary: 135 mg/dL — ABNORMAL HIGH (ref 70–99)
Glucose-Capillary: 169 mg/dL — ABNORMAL HIGH (ref 70–99)

## 2021-01-19 LAB — CBC
HCT: 34.2 % — ABNORMAL LOW (ref 36.0–46.0)
Hemoglobin: 11.6 g/dL — ABNORMAL LOW (ref 12.0–15.0)
MCH: 34.6 pg — ABNORMAL HIGH (ref 26.0–34.0)
MCHC: 33.9 g/dL (ref 30.0–36.0)
MCV: 102.1 fL — ABNORMAL HIGH (ref 80.0–100.0)
Platelets: 289 10*3/uL (ref 150–400)
RBC: 3.35 MIL/uL — ABNORMAL LOW (ref 3.87–5.11)
RDW: 13.2 % (ref 11.5–15.5)
WBC: 14 10*3/uL — ABNORMAL HIGH (ref 4.0–10.5)
nRBC: 0 % (ref 0.0–0.2)

## 2021-01-19 LAB — BASIC METABOLIC PANEL
Anion gap: 9 (ref 5–15)
BUN: 19 mg/dL (ref 8–23)
CO2: 26 mmol/L (ref 22–32)
Calcium: 7.8 mg/dL — ABNORMAL LOW (ref 8.9–10.3)
Chloride: 99 mmol/L (ref 98–111)
Creatinine, Ser: 0.39 mg/dL — ABNORMAL LOW (ref 0.44–1.00)
GFR, Estimated: >60 mL/min (ref >60)
Glucose, Bld: 250 mg/dL — ABNORMAL HIGH (ref 70–99)
Potassium: 3.4 mmol/L — ABNORMAL LOW (ref 3.5–5.1)
Sodium: 134 mmol/L — ABNORMAL LOW (ref 135–145)

## 2021-01-19 LAB — PHOSPHORUS: Phosphorus: 1.7 mg/dL — ABNORMAL LOW (ref 2.5–4.6)

## 2021-01-19 LAB — TRIGLYCERIDES: Triglycerides: 74 mg/dL (ref <150)

## 2021-01-19 MED ORDER — POTASSIUM PHOSPHATES 15 MMOLE/5ML IV SOLN
30.0000 mmol | Freq: Once | INTRAVENOUS | Status: AC
  Administered 2021-01-19: 30 mmol via INTRAVENOUS
  Filled 2021-01-19: qty 10

## 2021-01-19 MED ORDER — PREDNISONE 20 MG PO TABS: 20.0000 mg | ORAL_TABLET | Freq: Every day | ORAL | Status: DC

## 2021-01-19 MED ORDER — INSULIN DETEMIR 100 UNIT/ML ~~LOC~~ SOLN
15.0000 [IU] | Freq: Two times a day (BID) | SUBCUTANEOUS | Status: DC
  Administered 2021-01-19 – 2021-01-24 (×12): 15 [IU] via SUBCUTANEOUS
  Filled 2021-01-19 (×14): qty 0.15

## 2021-01-19 MED ORDER — PREDNISONE 20 MG PO TABS: 30.0000 mg | ORAL_TABLET | Freq: Every day | ORAL | Status: DC

## 2021-01-19 MED ORDER — PREDNISONE 20 MG PO TABS: 10.0000 mg | ORAL_TABLET | Freq: Every day | ORAL | Status: DC

## 2021-01-19 MED ORDER — PREDNISONE 20 MG PO TABS
60.0000 mg | ORAL_TABLET | Freq: Every day | ORAL | Status: DC
  Administered 2021-01-19 – 2021-01-21 (×3): 60 mg via ORAL
  Filled 2021-01-19 (×4): qty 3

## 2021-01-19 MED ORDER — PREDNISONE 20 MG PO TABS: 40.0000 mg | ORAL_TABLET | Freq: Every day | ORAL | Status: DC

## 2021-01-19 MED ORDER — PREDNISONE 20 MG PO TABS: 50.0000 mg | ORAL_TABLET | Freq: Every day | ORAL | Status: DC

## 2021-01-19 NOTE — Progress Notes (Signed)
NAME:  Tedra Coppernoll, MRN:  [id number], DOB:  16-Sep-1950, LOS: 7 ADMISSION DATE:  01/31/2021, CONSULTATION DATE:  01/08/2021 REFERRING MD:  Tamala Julian, CHIEF COMPLAINT:  Recurrent Seizures   History of Present Illness:  Cameka Rae is a 70 y.o. female with medical history significant for  seizures, anxiety, depression, and ovarian cancer who presented for seizure.  History is obtained from the patient's husband is present at bedside.  Apparently around 7 PM last night patient began to have several episodes of nausea and vomiting throughout the night.  She last threw up around 5 AM.  Her husband had went to go get some water and other things and heard patient knocking over things on the nightstand.  When he went back in the room patient was having a full blown generalized tonic-clonic seizure for which she says that she was foaming at the mouth and not responding.  EMS was called and was witnessed having a 2-minute seizure with them as well and given Versed.  Patient husband notes that she has been having the intermittent episodes that last several minutes with difficulty following commands, changes in speech, weakness, and personality changes over the last month.  Previously had been seizure-free since 2019 prior to seizures restarting on 12/15/2020.  She had been followed by Dr. Delice Lesch who had set her up with 48-hour EEG monitoring and wanted to obtain a MRI of the brain with and without contrast and her Keppra has been increased to 500 mg twice daily recently on 5/20.  ED Course: Upon admission to the emergency department patient was seen as a code stroke.  CT scan of the head without contrast noted no acute abnormalities.  Neurology evaluated the patient and suspected symptoms were related to seizure and findings.  Afebrile,.  Influenza and COVID-19 screening were negative.  CT angiogram of the head and neck with perfusion did not note any emergent large vessel occlusion, but did note ischemic CT perfusion values in  the posterior right hemisphere no correlated arterial stenosis or occlusion.  Patient was given Keppra 1500 mg IV and continued on home regimen dose of Keppra and phenobarbital.  TRH called to admit.originally, however EEG revealed active seizures and focal seizures  that did not resolve with Keppra x 2, Ativan 3 mg Phenytoin, and Phenobarbital. Pt is continuing to have L sided seizures.   Significant Hospital Events: Including procedures, antibiotic start and stop dates in addition to other pertinent events   01/29/2021 Admission to [facility]  01/18/2021 status epilepticus 6/8 ETT 6/9 had hyperkalemia, LP performed shows no infection 6/10 ongoing seizures noted on EEG Suspected Ab mediated encephalitis - work up thus far unremarkable.  CT Chest/A/P negative for occult malignancy.  6/11 seizures improved with solumedrol 6/13 commenced sedation weaning.   Interim History / Subjective:  Sedative infusions were weaned off yesterday. No further seizures.   Objective   Blood pressure (!) 155/84, pulse 89, temperature 98.2 F (36.8 C), temperature source Axillary, resp. rate (!) 25, height 5\' 3"  (1.6 m), weight 44.6 kg, SpO2 97 %.    Vent Mode: CPAP;PSV FiO2 (%):  [40 %] 40 % Set Rate:  [15 bmp] 15 bmp Vt Set:  [420 mL] 420 mL PEEP:  [5 cmH20] 5 cmH20 Pressure Support:  [8 cmH20] 8 cmH20 Plateau Pressure:  [12 cmH20-16 cmH20] 13 cmH20   Intake/Output Summary (Last 24 hours) at 01/19/2021 0835 Last data filed at 01/19/2021 0800 Gross per 24 hour  Intake 2343.49 ml  Output 1800 ml  Net 543.49 ml    Filed Weights   01/13/21 0500 01/14/21 0500 01/18/21 0453  Weight: 39.6 kg 43.5 kg 44.6 kg    Examination: General: critically ill, intubated, sedated HENT: ETT to vent, EEG leads in place Lungs: ctab no wheezes or crackles. Tolerating PSV.  Cardiovascular: RRR, no murmurs Abdomen: soft, nontender nondistended Extremities: Marked muscle waisting.  Neuro: Sedation is off. Opening eyes weakly  to voice.   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  - Continue feeding at goal.  - Increase insulin coverage.  - Continue PS ventilation as tolerated. Mental status will dictate timing of extubation.     Best practice (right click and "Reselect all SmartList Selections" daily)  Diet:  NPO tube feeds Pain/Anxiety/Delirium protocol (if indicated): Ketamine/Versed VAP protocol (if indicated): Yes DVT prophylaxis: Subcutaneous Heparin GI prophylaxis: PPI Glucose control:  SSI Yes - adding Detemir Central venous access: Yes, needed -  will likely remove once off sedative infusions.  Arterial line:  N/A Foley:  N/A - transition to external catheter Mobility:  bed rest  PT consulted: N/A Last date of multidisciplinary goals of care discussion: 6/8, spoke with patient's husband, decided to continue with full aggressive care Code Status:  full code Disposition: ICU  The patient is critically ill due to respiratory failure, status epilepticus.  Critical care was necessary to treat or prevent imminent or life-threatening deterioration.  Critical care was time spent personally by me on the  following activities: development of treatment plan with patient and/or surrogate as well as nursing, discussions with consultants, evaluation of patient's response to treatment, examination of patient, obtaining history from patient or surrogate, ordering and performing treatments and interventions, ordering and review of laboratory studies, ordering and review of radiographic studies, pulse oximetry, re-evaluation of patient's condition and participation in multidisciplinary rounds.   Critical Care Time devoted to patient care services described in this note is 40 minutes. This time reflects time of care of this [place] . This critical care time does not reflect separately billable procedures or procedure time, teaching time or supervisory time of PA/NP/Med student/Med Resident etc but could involve care discussion time.       Kipp Brood [place] [facility] 01/19/2021 8:35 AM  Pager: see AMION  If no response to pager , please call critical care on call (see AMION) until 7pm After 7:00 pm call Elink

## 2021-01-19 NOTE — Procedures (Addendum)
Patient Name: Samantha Atkins  MRN: [id number]  Epilepsy Attending: Lora Havens  Referring Physician/Provider: Dr. Su Monks Duration: 01/18/2021 1541 to 01/19/2021 1541   Patient history: 70 year old female presented with focal convulsive status epilepticus.  EEG to evaluate for seizures.   Level of alertness: comatose   AEDs during EEG study: Keppra, phenobarbital, PHT, LCM, Perampanel   Technical aspects: This EEG study was done with scalp electrodes positioned according to the 10-20 International system of electrode placement. Electrical activity was acquired at a sampling rate of 500Hz  and reviewed with a high frequency filter of 70Hz  and a low frequency filter of 1Hz . EEG data were recorded continuously and digitally stored.   Description: EEG showed continuous generalized and maximal right centro-temporo-parietal region polymorphic mixed frequencies with predominantly 3 to 6 Hz theta-delta slowing as well as intermittent generalized 13 to 18 Hz beta activity.  Abundant Quasi-PLED like polyspikes were noted in right centro-temporo-parietal region. Frequent left hemispheric, maximal left frontotemporal region spikes were also noted   ABNORMALITY - Continuous slow, generalized nd maximal right centro-temporo-parietal region - Lateralized periodic discharges,right hemisphere - Spike, left hemisphere, maximal left frontotemporal region   IMPRESSION: This study showed evidence of epileptogenicity and cortical dysfunction arising from right hemisphere. Independent epileptogenicity was also noted in left hemisphere, maximal left frontotemporal region. Additionally there is severe diffuse encephalopathy likely related to seizures, sedation. No seizure were seen during this study.   Samantha Atkins [name]

## 2021-01-19 NOTE — Progress Notes (Signed)
Phos 1.7, K+ 3.4 Replaced per protocol

## 2021-01-19 NOTE — Progress Notes (Signed)
Subjective: No clinical seizures overnight.  Husband at bedside.  ROS: unable to obtain due to poor mental status  Examination  Vital signs in last 24 hours: Temp:  [98.2 F (36.8 C)-100.2 F (37.9 C)] 98.3 F (36.8 C) (06/14 1200) Pulse Rate:  [83-107] 91 (06/14 1200) Resp:  [24-35] 29 (06/14 1200) BP: (143-172)/(68-90) 143/76 (06/14 1200) SpO2:  [88 %-100 %] 99 % (06/14 1200) FiO2 (%):  [40 %] 40 % (06/14 1129)  General: lying in bed, not in apparent distress CVS: pulse-normal rate and rhythm RS: Intubated, CTAB Extremities: normal, warm Neuro: opens eyes once to repeated verbal and tactile stimulation, did not follow commands, PERRLA, corneal reflex absent, gag reflex absent.  1/5 with noxious stimulation in left upper and left lower extremity, no movement in right upper and lower extremity  Basic Metabolic Panel: Recent Labs  Lab 01/14/21 1022 01/14/21 1620 01/15/21 0400 01/15/21 1700 01/16/21 0435 01/16/21 2350 01/17/21 0412 01/17/21 1046 01/18/21 0343 01/19/21 0429  NA 139  --  133*  --  136  --  134*  --  135 134*  K 3.5  --  3.6  --  3.2*  --  4.4  --  3.6 3.4*  CL 110  --  102  --  103  --  105  --  101 99  CO2 17*  --  16*  --  23  --  22  --  25 26  GLUCOSE 54*  --  131*  --  210*  --  298*  --  219* 250*  BUN <5*  --  <5*  --  6*  --  7*  --  15 19  CREATININE 0.51  --  0.44  --  0.37*  --  0.44  --  0.43* 0.39*  CALCIUM 6.8*  --  6.9*  --  7.4*  --  7.1*  --  7.3* 7.8*  MG 1.8 1.6* 1.5* 2.3 2.1  --   --   --   --   --   PHOS 2.0* 2.9 2.0* >30.0* <1.0* <1.0*  --  2.4*  --  1.7*    CBC: Recent Labs  Lab 01/14/21 1022 01/14/21 1620 01/15/21 1000 01/16/21 0435 01/17/21 0412 01/18/21 0343 01/19/21 0429  WBC 4.2  --   --  18.4* 21.1* 18.9* 14.0*  HGB 10.1*   < > 11.9* 12.6 11.5* 11.4* 11.6*  HCT 30.7*   < > 35.8* 36.8 33.8* 33.5* 34.2*  MCV 106.6*  --   --  100.3* 102.4* 102.4* 102.1*  PLT 244  --   --  385 364 320 289   < > = values in this  interval not displayed.     Coagulation Studies: No results for input(s): LABPROT, INR in the last 72 hours.  Imaging No new brain imaging overnight   ASSESSMENT AND PLAN: 70 year old female who presented with focal convulsive status epilepticus which was refractory to Versed, ketamine and multiple AEDs.  Lumbar puncture was performed on 01/14/2021, no CSF pleocytosis was seen, CSF and serum was sent to [facility] for autoimmune and paraneoplastic work-up and patient was started on IV Solu-Medrol 1000 mg after which EEG showed significant improvement.   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     Recommendations -Continue phenytoin 150mg  BID, Keppra 2000 mg twice daily, Vimpat 100 mg twice daily, perampanel 8 mg nightly, phenobarbital 160 mg twice daily -If EEG worsens, can consider  adding Onfi -Completed Solu-Medrol 1000 mg for 5 days on 01/18/2021.  -Ordered prednisone taper schedule today - Continue LTM EEG while we are adjusting medications  -Updated husband about current neurologic exam and EEG findings -Management of rest of the comorbidities per primary team   CRITICAL CARE Performed by: Lora Havens     Total critical care time: 35 minutes   Critical care time was exclusive of separately billable procedures and treating other patients.   Critical care was necessary to treat or prevent imminent or life-threatening deterioration.   Critical care was time spent personally by me on the following activities: development of treatment plan with patient and/or surrogate as well as nursing, discussions with consultants, evaluation of patient's response to treatment, examination of patient, obtaining history from patient or surrogate, ordering and performing treatments and interventions, ordering and review of laboratory studies, ordering and review of radiographic studies,  pulse oximetry and re-evaluation of patient's condition.     Zeb Comfort Epilepsy [facility] For questions after 5pm please refer to AMION to reach the Neurologist on call

## 2021-01-20 DIAGNOSIS — G40909 Epilepsy, unspecified, not intractable, without status epilepticus: Secondary | ICD-10-CM | POA: Diagnosis not present

## 2021-01-20 LAB — CBC
HCT: 34.5 % — ABNORMAL LOW (ref 36.0–46.0)
Hemoglobin: 11.7 g/dL — ABNORMAL LOW (ref 12.0–15.0)
MCH: 34.5 pg — ABNORMAL HIGH (ref 26.0–34.0)
MCHC: 33.9 g/dL (ref 30.0–36.0)
MCV: 101.8 fL — ABNORMAL HIGH (ref 80.0–100.0)
Platelets: 282 10*3/uL (ref 150–400)
RBC: 3.39 MIL/uL — ABNORMAL LOW (ref 3.87–5.11)
RDW: 13.2 % (ref 11.5–15.5)
WBC: 13.4 10*3/uL — ABNORMAL HIGH (ref 4.0–10.5)
nRBC: 0 % (ref 0.0–0.2)

## 2021-01-20 LAB — FOLATE: Folate: 13.6 ng/mL (ref >5.9)

## 2021-01-20 LAB — VITAMIN D 25 HYDROXY (VIT D DEFICIENCY, FRACTURES): Vit D, 25-Hydroxy: 27.44 ng/mL — ABNORMAL LOW (ref 30–100)

## 2021-01-20 LAB — GLUCOSE, CAPILLARY
Glucose-Capillary: 132 mg/dL — ABNORMAL HIGH (ref 70–99)
Glucose-Capillary: 122 mg/dL — ABNORMAL HIGH (ref 70–99)
Glucose-Capillary: 171 mg/dL — ABNORMAL HIGH (ref 70–99)
Glucose-Capillary: 173 mg/dL — ABNORMAL HIGH (ref 70–99)
Glucose-Capillary: 110 mg/dL — ABNORMAL HIGH (ref 70–99)
Glucose-Capillary: 110 mg/dL — ABNORMAL HIGH (ref 70–99)

## 2021-01-20 LAB — BASIC METABOLIC PANEL
Anion gap: 11 (ref 5–15)
BUN: 25 mg/dL — ABNORMAL HIGH (ref 8–23)
CO2: 27 mmol/L (ref 22–32)
Calcium: 7.9 mg/dL — ABNORMAL LOW (ref 8.9–10.3)
Chloride: 99 mmol/L (ref 98–111)
Creatinine, Ser: 0.37 mg/dL — ABNORMAL LOW (ref 0.44–1.00)
GFR, Estimated: >60 mL/min (ref >60)
Glucose, Bld: 97 mg/dL (ref 70–99)
Potassium: 3.4 mmol/L — ABNORMAL LOW (ref 3.5–5.1)
Sodium: 137 mmol/L (ref 135–145)

## 2021-01-20 LAB — VITAMIN B12: Vitamin B-12: 372 pg/mL (ref 180–914)

## 2021-01-20 LAB — PHENOBARBITAL LEVEL: Phenobarbital: 23.5 ug/mL (ref 15.0–30.0)

## 2021-01-20 LAB — PHOSPHORUS: Phosphorus: 2 mg/dL — ABNORMAL LOW (ref 2.5–4.6)

## 2021-01-20 MED ORDER — THIAMINE HCL 100 MG PO TABS
100.0000 mg | ORAL_TABLET | Freq: Every day | ORAL | Status: DC
  Administered 2021-01-20 – 2021-01-26 (×7): 100 mg
  Filled 2021-01-20 (×7): qty 1

## 2021-01-20 MED ORDER — SODIUM CHLORIDE 0.9 % IV SOLN
Freq: Every day | INTRAVENOUS | Status: DC
  Filled 2021-01-20 (×2): qty 25

## 2021-01-20 MED ORDER — POTASSIUM PHOSPHATES 15 MMOLE/5ML IV SOLN
30.0000 mmol | Freq: Once | INTRAVENOUS | Status: AC
  Administered 2021-01-20: 30 mmol via INTRAVENOUS
  Filled 2021-01-20: qty 10

## 2021-01-20 MED ORDER — DIPHENHYDRAMINE HCL 50 MG/ML IJ SOLN
50.0000 mg | Freq: Once | INTRAMUSCULAR | Status: AC
  Administered 2021-01-20: 50 mg via INTRAVENOUS
  Filled 2021-01-20: qty 1

## 2021-01-20 MED ORDER — ACETAMINOPHEN 500 MG PO TABS
1000.0000 mg | ORAL_TABLET | Freq: Once | ORAL | Status: AC
  Administered 2021-01-20: 1000 mg
  Filled 2021-01-20: qty 2

## 2021-01-20 MED ORDER — POTASSIUM CHLORIDE 20 MEQ PO PACK
40.0000 meq | PACK | Freq: Once | ORAL | Status: AC
  Administered 2021-01-20: 40 meq
  Filled 2021-01-20: qty 2

## 2021-01-20 MED ORDER — IMMUNE GLOBULIN (HUMAN) 10 GM/100ML IV SOLN
15.0000 g | INTRAVENOUS | Status: AC
  Administered 2021-01-20 – 2021-01-24 (×5): 15 g via INTRAVENOUS
  Filled 2021-01-20 (×3): qty 100
  Filled 2021-01-20: qty 50
  Filled 2021-01-20: qty 100

## 2021-01-20 NOTE — Progress Notes (Addendum)
Vitals 15 minutes after starting IVIG. CCM NP Brooke and Dr. Lynetta Mare notified. Infusion currently stopped and D5 is running.   01/20/21 1545  Vitals  Temp (!) (S)  101.1 F (38.4 C)  BP 130/75  MAP (mmHg) 89  Pulse Rate 100  ECG Heart Rate (!) 101  Resp (!) 34  Oxygen Therapy  SpO2 97 %  MEWS Score  MEWS Temp 1  MEWS Systolic 0  MEWS Pulse 1  MEWS RR 2  MEWS LOC 2  MEWS Score 6  MEWS Score Color Red    1600 update: Per. Dr Lynetta Mare, continue IVIG infusion and give tylenol and benadryl. Patient's VSS. Will continue to monitor.   1700 update: Patient temperature now 98.6 other VSS. :)

## 2021-01-20 NOTE — Progress Notes (Signed)
Subjective: No acute events overnight.  No clinical seizures but patient not opening her eyes today.  ROS: Unable to obtain due to poor mental status  Examination  Vital signs in last 24 hours: Temp:  [98.3 F (36.8 C)-99.9 F (37.7 C)] 99.1 F (37.3 C) (06/15 1200) Pulse Rate:  [94-110] 94 (06/15 1200) Resp:  [25-36] 29 (06/15 1200) BP: (107-150)/(60-79) 107/71 (06/15 1200) SpO2:  [94 %-100 %] 97 % (06/15 1200) FiO2 (%):  [40 %] 40 % (06/15 1127) Weight:  [43.7 kg] 43.7 kg (06/15 0500)  General: lying in bed, not in apparent distress CVS: pulse-normal rate and rhythm RS: Intubated, CTAB Extremities: normal, warm Neuro: Comatose, does not opens eyes once to noxious stimulation, did not follow commands, PERRLA, corneal reflex present gag reflex present.  Does not withdraw to noxious stimuli in all 4 extremities  Basic Metabolic Panel: Recent Labs  Lab 01/14/21 1022 01/14/21 1620 01/15/21 0400 01/15/21 1700 01/16/21 0435 01/16/21 2350 01/17/21 0412 01/17/21 1046 01/18/21 0343 01/19/21 0429 01/20/21 0530  NA 139  --  133*  --  136  --  134*  --  135 134* 137  K 3.5  --  3.6  --  3.2*  --  4.4  --  3.6 3.4* 3.4*  CL 110  --  102  --  103  --  105  --  101 99 99  CO2 17*  --  16*  --  23  --  22  --  25 26 27   GLUCOSE 54*  --  131*  --  210*  --  298*  --  219* 250* 97  BUN <5*  --  <5*  --  6*  --  7*  --  15 19 25*  CREATININE 0.51  --  0.44  --  0.37*  --  0.44  --  0.43* 0.39* 0.37*  CALCIUM 6.8*  --  6.9*  --  7.4*  --  7.1*  --  7.3* 7.8* 7.9*  MG 1.8 1.6* 1.5* 2.3 2.1  --   --   --   --   --   --   PHOS 2.0* 2.9 2.0* >30.0* <1.0* <1.0*  --  2.4*  --  1.7* 2.0*    CBC: Recent Labs  Lab 01/16/21 0435 01/17/21 0412 01/18/21 0343 01/19/21 0429 01/20/21 0530  WBC 18.4* 21.1* 18.9* 14.0* 13.4*  HGB 12.6 11.5* 11.4* 11.6* 11.7*  HCT 36.8 33.8* 33.5* 34.2* 34.5*  MCV 100.3* 102.4* 102.4* 102.1* 101.8*  PLT 385 364 320 289 282     Coagulation Studies: No  results for input(s): LABPROT, INR in the last 72 hours.  Imaging No new brain imaging overnight  ASSESSMENT AND PLAN:  70 year old female who presented with focal convulsive status epilepticus which was refractory to Versed, ketamine and multiple AEDs.  Lumbar puncture was performed on 01/14/2021, no CSF pleocytosis was seen, CSF and serum was sent to [facility] for autoimmune and paraneoplastic work-up and patient was started on IV Solu-Medrol 1000 mg after which EEG showed significant improvement.   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     Recommendations -Continue phenytoin 150mg  BID, Keppra 2000 mg twice daily, Vimpat 100 mg twice daily, perampanel 8 mg nightly, phenobarbital 160 mg twice daily -Completed Solu-Medrol 1000 mg for 5 days on 01/18/2021, now on prednisone taper. -Patient is difficult to arouse today which could  be due to antiseizure medications.  However patient did not get any IV Solu-Medrol yesterday which would also contribute to worsening mentation.  Discussed with Dr. Lynetta Mare.  We will start IVIG for 5 days for antibody mediated encephalitis - Will discontinue LTM EEG as patient has not had any seizures in the last few days. -Management of rest of the comorbidities per primary team   CRITICAL CARE Performed by: Lora Havens     Total critical care time: 35 minutes   Critical care time was exclusive of separately billable procedures and treating other patients.   Critical care was necessary to treat or prevent imminent or life-threatening deterioration.   Critical care was time spent personally by me on the following activities: development of treatment plan with patient and/or surrogate as well as nursing, discussions with consultants, evaluation of patient's response to treatment, examination of  patient, obtaining history from patient or surrogate, ordering and performing treatments and interventions, ordering and review of laboratory studies, ordering and review of radiographic studies, pulse oximetry and re-evaluation of patient's condition.   Zeb Comfort Epilepsy [facility] For questions after 5pm please refer to AMION to reach the Neurologist on call

## 2021-01-20 NOTE — Progress Notes (Signed)
Vitals prior to IVIG  01/20/21 1530  Vitals  Temp 99.3 F (37.4 C)  Temp Source Axillary  BP 134/80  MAP (mmHg) 94  Pulse Rate 100  ECG Heart Rate 100  Resp (!) 33  Oxygen Therapy  SpO2 97 %  O2 Device Ventilator  MEWS Score  MEWS Temp 0  MEWS Systolic 0  MEWS Pulse 0  MEWS RR 2  MEWS LOC 2  MEWS Score 4  MEWS Score Color Red   Montez Hageman RN

## 2021-01-20 NOTE — Progress Notes (Signed)
Nutrition Follow-up  DOCUMENTATION CODES:   Underweight, Severe malnutrition in context of chronic illness  INTERVENTION:   Tube Feeding via Cortrak: Vital AF 1.2 at 50 ml/hr Provides 1440 kcals, 90 g of protein, 937 mL of water  Continue MVI with Minerals daily  Add Thiamine 100 mg daily  Checking stat B6 today with plans for supplementation beginning today. Discussed with MD and also discussed plan for supplementation with Pharmacy  Recommend checking Copper, Vitamin D, B12, Folate   NUTRITION DIAGNOSIS:   Severe Malnutrition related to chronic illness (anxiety, depression) as evidenced by severe muscle depletion, severe fat depletion.  Being addressed via TF   GOAL:   Patient will meet greater than or equal to 90% of their needs  Met via TF    MONITOR:   Vent status, Labs, Weight trends, TF tolerance, I & O's  REASON FOR ASSESSMENT:   Ventilator, Consult Enteral/tube feeding initiation and management, Assessment of nutrition requirement/status  ASSESSMENT:   70 year old female who presented to the ED on 6/07 with seizures and stroke-like symptoms. PMH of seizures, GERD, anxiety, depression, insomnia, migraines, and ovarian cancer. Pt admitted with recurrent seizures with Todd's paralysis.  Pt remains on vent support  Per Neurology, pt with acute encephalopathy, likely due to antibody medicated encephalitis, differentials include autoimmune vs neoplastic causes Noted autoimmune and paraneoplastic work-up in progress  Starting IVIG today for 5 days for antibody mediated encephalitis  Tolerating Vital AF 1.2 at 50 ml/hr via OG. Noted possible plan for trach/PEG. Also noted possible plan for comfort care if no improvement in several days  Pt with reported hx of eating disorder, anorexia nervosa. Per husband, pt lately has only been eating wheat tortillas and Luna bars. Pt is underweight and malnourished. Given these factors, pt is at risk for multiple  vitamin/mineral deficiencies  Also noted husband reports pt takes takes a bunch of supplements including iron, calcium, vitamin D, MVI, mag/zinc/calcium  Vitamin B6 deficiency is associated with refractory seizures. Dicussed with MD  Plan to check for other vitamin deficiencies, in particular those that affect the nervous system  Phosphorus remains low but levels improved; supplementation continues  Labs: phosphorus 2.0 (L), potassium 3.4 (L), CBGs 150-245 Meds: ss novolog, levemir, MVI with Minerals, potassium phosphate, KCL, prednisone   Diet Order:   Diet Order             Diet NPO time specified  Diet effective now                   EDUCATION NEEDS:   No education needs have been identified at this time  Skin:  Skin Assessment: Reviewed RN Assessment  Last BM:  6/15  Height:   Ht Readings from Last 1 Encounters:  01/13/21 5' 3"  (1.6 m)    Weight:   Wt Readings from Last 1 Encounters:  01/20/21 43.7 kg    BMI:  Body mass index is 17.07 kg/m.  Estimated Nutritional Needs:   Kcal:  1300-1500  Protein:  65-85 grams  Fluid:  >/= 1.5 L/day   Kerman Passey MS, RDN, LDN, CNSC Registered Dietitian III Clinical Nutrition RD Pager and On-Call Pager Number Located in [place]

## 2021-01-20 NOTE — Procedures (Signed)
Patient Name: Samantha Atkins  MRN: [id number]  Epilepsy Attending: Lora Havens  Referring Physician/Provider: Dr. Su Monks Duration: 01/19/2021 1541 to 01/20/2021 1600   Patient history: 70 year old female presented with focal convulsive status epilepticus.  EEG to evaluate for seizures.   Level of alertness: comatose   AEDs during EEG study: Keppra, phenobarbital, PHT, LCM, Perampanel   Technical aspects: This EEG study was done with scalp electrodes positioned according to the 10-20 International system of electrode placement. Electrical activity was acquired at a sampling rate of 500Hz  and reviewed with a high frequency filter of 70Hz  and a low frequency filter of 1Hz . EEG data were recorded continuously and digitally stored.   Description:  EEG showed continuous generalized and maximal right centro-temporo-parietal region polymorphic mixed frequencies with predominantly 3 to 6 Hz theta-delta slowing as well as intermittent generalized 13 to 18 Hz beta activity.  Abundant Quasi-PLED like polyspikes were noted in right centro-temporo-parietal region. Frequent left hemispheric, maximal left frontotemporal region spikes were also noted   ABNORMALITY - Continuous slow, generalized and maximal right centro-temporo-parietal region - Lateralized periodic discharges,right hemisphere - Spike, left hemisphere, maximal left frontotemporal region   IMPRESSION: This study showed evidence of epileptogenicity and cortical dysfunction arising from right hemisphere. Independent epileptogenicity was also noted in left hemisphere, maximal left frontotemporal region. Additionally there is severe diffuse encephalopathy likely related to seizures, sedation. No seizure were seen during this study.  EEG appears similar to previous day.   [name] [name]

## 2021-01-20 NOTE — Progress Notes (Signed)
Just picked up IVIG from pharmacy and it is at bedside. I put in a new IV team consult per request and let them know.    Montez Hageman, RN

## 2021-01-20 NOTE — Progress Notes (Signed)
NAME:  Samantha Atkins, MRN:  [id number], DOB:  1951-01-15, LOS: 8 ADMISSION DATE:  01/11/2021, CONSULTATION DATE:  02/04/2021 REFERRING MD:  Tamala Julian, CHIEF COMPLAINT:  Recurrent Seizures   History of Present Illness:  Samantha Atkins is a 70 y.o. female with medical history significant for  seizures, anxiety, depression, and ovarian cancer who presented for seizure.  History is obtained from the patient's husband is present at bedside.  Apparently around 7 PM last night patient began to have several episodes of nausea and vomiting throughout the night.  She last threw up around 5 AM.  Her husband had went to go get some water and other things and heard patient knocking over things on the nightstand.  When he went back in the room patient was having a full blown generalized tonic-clonic seizure for which she says that she was foaming at the mouth and not responding.  EMS was called and was witnessed having a 2-minute seizure with them as well and given Versed.  Patient husband notes that she has been having the intermittent episodes that last several minutes with difficulty following commands, changes in speech, weakness, and personality changes over the last month.  Previously had been seizure-free since 2019 prior to seizures restarting on 12/15/2020.  She had been followed by Dr. Delice Lesch who had set her up with 48-hour EEG monitoring and wanted to obtain a MRI of the brain with and without contrast and her Keppra has been increased to 500 mg twice daily recently on 5/20.  ED Course: Upon admission to the emergency department patient was seen as a code stroke.  CT scan of the head without contrast noted no acute abnormalities.  Neurology evaluated the patient and suspected symptoms were related to seizure and findings.  Afebrile,.  Influenza and COVID-19 screening were negative.  CT angiogram of the head and neck with perfusion did not note any emergent large vessel occlusion, but did note ischemic CT perfusion values in  the posterior right hemisphere no correlated arterial stenosis or occlusion.  Patient was given Keppra 1500 mg IV and continued on home regimen dose of Keppra and phenobarbital.  TRH called to admit.originally, however EEG revealed active seizures and focal seizures  that did not resolve with Keppra x 2, Ativan 3 mg Phenytoin, and Phenobarbital. Pt is continuing to have L sided seizures.   Significant Hospital Events: Including procedures, antibiotic start and stop dates in addition to other pertinent events   01/21/2021 Admission to [facility]  01/31/2021 status epilepticus 6/8 ETT 6/9 had hyperkalemia, LP performed shows no infection 6/10 ongoing seizures noted on EEG Suspected Ab mediated encephalitis - work up thus far unremarkable.  CT Chest/A/P negative for occult malignancy.  6/11 seizures improved with solumedrol 6/13 commenced sedation weaning.  6/14 patient started to open eyes to commands  Interim History / Subjective:  Sedative infusions were weaned off yesterday. No further seizures.  Limited responsiveness still.   Objective   Blood pressure 128/72, pulse (!) 102, temperature 99.9 F (37.7 C), temperature source Axillary, resp. rate (!) 32, height 5\' 3"  (1.6 m), weight 43.7 kg, SpO2 95 %.    Vent Mode: PSV;CPAP FiO2 (%):  [40 %] 40 % Set Rate:  [15 bmp] 15 bmp Vt Set:  [420 mL] 420 mL PEEP:  [5 cmH20] 5 cmH20 Pressure Support:  [8 cmH20-10 cmH20] 10 cmH20 Plateau Pressure:  [11 cmH20-18 cmH20] 14 cmH20   Intake/Output Summary (Last 24 hours) at 01/20/2021 0818 Last data filed at 01/20/2021  0800 Gross per 24 hour  Intake 2016.15 ml  Output 2750 ml  Net -733.85 ml    Filed Weights   01/14/21 0500 01/18/21 0453 01/20/21 0500  Weight: 43.5 kg 44.6 kg 43.7 kg    Examination: General: critically ill, intubated, sedated HENT: ETT to vent, EEG leads in place Lungs: ctab no wheezes or crackles. Tolerating PSV.  Cardiovascular: RRR, no murmurs Abdomen: soft, nontender  nondistended Extremities: Marked muscle waisting.  Neuro: Sedation is off. Limited response to pain. Not opening eyes today.   Continuous EEG reviewed - continues to have frequent triphasic waves but no sustained seizure activity.  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  - Continue feeding at goal.  - Increase insulin coverage.  - Continue PS ventilation as tolerated. Mental status will dictate timing of extubation.  - According to husband, poor pre-morbid quality of life, thus limited trial of aggressive treatment - no trach or PEG. If no improvement by 6/17, would pursue comfort care.     Best practice (right click and "Reselect all SmartList Selections" daily)  Diet:  NPO tube feeds Pain/Anxiety/Delirium protocol (if indicated): Ketamine/Versed VAP protocol (if indicated): Yes DVT prophylaxis: Subcutaneous Heparin GI prophylaxis: PPI Glucose control:  SSI Yes - adding Detemir Central venous access: Yes, needed -  will likely remove once off sedative infusions.  Arterial line:  N/A Foley:  N/A - transition to external catheter Mobility:  bed rest  PT consulted: N/A Last date of multidisciplinary goals of care discussion: 6/8, spoke with patient's husband, decided to  continue with full aggressive care Code Status:  full code Disposition: ICU  The patient is critically ill due to respiratory failure, status epilepticus.  Critical care was necessary to treat or prevent imminent or life-threatening deterioration.  Critical care was time spent personally by me on the following activities: development of treatment plan with patient and/or surrogate as well as nursing, discussions with consultants, evaluation of patient's response to treatment, examination of patient, obtaining history from patient or surrogate, ordering and performing treatments and interventions, ordering and review of laboratory studies, ordering and review of radiographic studies, pulse oximetry, re-evaluation of patient's condition and participation in multidisciplinary rounds.   Critical Care Time devoted to patient care services described in this note is 35 minutes. This time reflects time of care of this [place] . This critical care time does not reflect separately billable procedures or procedure time, teaching time or supervisory time of PA/NP/Med student/Med Resident etc but could involve care discussion time.       Kipp Brood [facility] 01/20/2021 8:18 AM  Pager: see AMION  If no response to pager , please call critical care on call (see AMION) until 7pm After 7:00 pm call Elink

## 2021-01-20 NOTE — Progress Notes (Signed)
LTM EEG discontinued - no skin breakdown at unhook.   

## 2021-01-21 DIAGNOSIS — G40909 Epilepsy, unspecified, not intractable, without status epilepticus: Secondary | ICD-10-CM | POA: Diagnosis not present

## 2021-01-21 DIAGNOSIS — R569 Unspecified convulsions: Secondary | ICD-10-CM | POA: Diagnosis not present

## 2021-01-21 LAB — GLUCOSE, CAPILLARY
Glucose-Capillary: 118 mg/dL — ABNORMAL HIGH (ref 70–99)
Glucose-Capillary: 128 mg/dL — ABNORMAL HIGH (ref 70–99)
Glucose-Capillary: 128 mg/dL — ABNORMAL HIGH (ref 70–99)
Glucose-Capillary: 107 mg/dL — ABNORMAL HIGH (ref 70–99)
Glucose-Capillary: 113 mg/dL — ABNORMAL HIGH (ref 70–99)
Glucose-Capillary: 138 mg/dL — ABNORMAL HIGH (ref 70–99)

## 2021-01-21 LAB — COPPER, SERUM: Copper: 162 ug/dL — ABNORMAL HIGH (ref 80–158)

## 2021-01-21 MED ORDER — SODIUM CHLORIDE 0.9 % IV SOLN
65.0000 mg | Freq: Two times a day (BID) | INTRAVENOUS | Status: DC
  Filled 2021-01-21: qty 0.5

## 2021-01-21 MED ORDER — VITAMIN B-6 50 MG PO TABS
50.0000 mg | ORAL_TABLET | Freq: Three times a day (TID) | ORAL | Status: DC
  Administered 2021-01-22 – 2021-01-26 (×15): 50 mg
  Filled 2021-01-21 (×16): qty 1

## 2021-01-21 MED ORDER — PREGABALIN 50 MG PO CAPS
100.0000 mg | ORAL_CAPSULE | Freq: Two times a day (BID) | ORAL | Status: DC
  Administered 2021-01-21 – 2021-01-26 (×11): 100 mg
  Filled 2021-01-21 (×11): qty 2

## 2021-01-21 MED ORDER — PHENOBARBITAL SODIUM 65 MG/ML IJ SOLN
65.0000 mg | Freq: Two times a day (BID) | INTRAMUSCULAR | Status: DC
  Administered 2021-01-21 – 2021-01-25 (×8): 65 mg via INTRAVENOUS
  Filled 2021-01-21 (×8): qty 1

## 2021-01-21 NOTE — Progress Notes (Signed)
Subjective: Samantha Atkins. Husband at bedside  ROS: Unable to obtain due to poor mental status  Examination  Vital signs in last 24 hours: Temp:  [97.8 F (36.6 C)-101.1 F (38.4 C)] 97.8 F (36.6 C) (06/16 1200) Pulse Rate:  [81-113] 81 (06/16 1200) Resp:  [18-35] 23 (06/16 1200) BP: (104-145)/(63-82) 108/63 (06/16 1200) SpO2:  [95 %-100 %] 99 % (06/16 1200) FiO2 (%):  [40 %] 40 % (06/16 1140) Weight:  [44 kg] 44 kg (06/16 0500)  General: lying in bed, not in apparent distress CVS: pulse-normal rate and rhythm RS: Intubated, CTAB Extremities: normal, warm Neuro: Comatose, does not opens eyes once to noxious stimulation, did not follow commands, PERRLA, corneal reflex present gag reflex present.  Does not withdraw to noxious stimuli in all 4 extremities  Basic Metabolic Panel: Recent Labs  Lab 01/14/21 1620 01/14/21 1620 01/15/21 0400 01/15/21 1700 01/16/21 0435 01/16/21 2350 01/17/21 0412 01/17/21 1046 01/18/21 0343 01/19/21 0429 01/20/21 0530  NA  --    < > 133*  --  136  --  134*  --  135 134* 137  K  --    < > 3.6  --  3.2*  --  4.4  --  3.6 3.4* 3.4*  CL  --    < > 102  --  103  --  105  --  101 99 99  CO2  --    < > 16*  --  23  --  22  --  25 26 27   GLUCOSE  --    < > 131*  --  210*  --  298*  --  219* 250* 97  BUN  --    < > <5*  --  6*  --  7*  --  15 19 25*  CREATININE  --    < > 0.44  --  0.37*  --  0.44  --  0.43* 0.39* 0.37*  CALCIUM  --    < > 6.9*  --  7.4*  --  7.1*  --  7.3* 7.8* 7.9*  MG 1.6*  --  1.5* 2.3 2.1  --   --   --   --   --   --   PHOS 2.9  --  2.0* >30.0* <1.0* <1.0*  --  2.4*  --  1.7* 2.0*   < > = values in this interval not displayed.    CBC: Recent Labs  Lab 01/16/21 0435 01/17/21 0412 01/18/21 0343 01/19/21 0429 01/20/21 0530  WBC 18.4* 21.1* 18.9* 14.0* 13.4*  HGB 12.6 11.5* 11.4* 11.6* 11.7*  HCT 36.8 33.8* 33.5* 34.2* 34.5*  MCV 100.3* 102.4* 102.4* 102.1* 101.8*  PLT 385 364 320 289 282     Coagulation Studies: No  results for input(s): LABPROT, INR in the last 72 hours.  Imaging No new brain imaging overnight   ASSESSMENT AND PLAN:  70 year old female who presented with focal convulsive status epilepticus which was refractory to Versed, ketamine and multiple AEDs.  Lumbar puncture was performed on 01/14/2021, no CSF pleocytosis was seen, CSF and serum was sent to [facility] for autoimmune and paraneoplastic work-up and patient was started on IV Solu-Medrol 1000 mg after which EEG showed significant improvement.   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     Recommendations -Continue phenytoin 150mg  BID, Keppra 2000 mg twice daily, Vimpat 100 mg twice daily, perampanel  8 mg nightly,  - Reduce phenobarbital 65mg  BID and pregabalin 100mg  BID to minimize sedation -Completed Solu-Medrol 1000 mg for 5 days on 01/18/2021, now on prednisone taper. - On  IVIG D2/5 days for antibody mediated encephalitis -Discussed with husband that patient continues to be comatose and therefore I suspect severe neurological injury some of that may be reversible but will likely require prolonged hospitalization with tracheostomy and feeding tube.  Husband states patient was very clear that she would not want tracheostomy and feeding tube.  Husband will speak with patient's family regarding further goals of care and will likely consider transition to comfort care either later this week or early next week -Management of rest of comorbidities per primary team   CRITICAL CARE Performed by: Lora Havens    Total critical care time: 35 minutes   Critical care time was exclusive of separately billable procedures and treating other patients.   Critical care was necessary to treat or prevent imminent or life-threatening deterioration.   Critical care was time spent  personally by me on the following activities: development of treatment plan with patient and/or surrogate as well as nursing, discussions with consultants, evaluation of patient's response to treatment, examination of patient, obtaining history from patient or surrogate, ordering and performing treatments and interventions, ordering and review of laboratory studies, ordering and review of radiographic studies, pulse oximetry and re-evaluation of patient's condition.      Zeb Comfort Epilepsy [facility] For questions after 5pm please refer to AMION to reach the Neurologist on call

## 2021-01-21 NOTE — Progress Notes (Signed)
NAME:  Samantha Atkins, MRN:  [id number], DOB:  February 15, 1951, LOS: 9 ADMISSION DATE:  01/13/2021, CONSULTATION DATE:  01/09/2021 REFERRING MD:  Tamala Julian, CHIEF COMPLAINT:  Recurrent Seizures   History of Present Illness:  Samantha Atkins is a 70 y.o. female with medical history significant for  seizures, anxiety, depression, and ovarian cancer who presented for seizure.  History is obtained from the patient's husband is present at bedside.  Apparently around 7 PM last night patient began to have several episodes of nausea and vomiting throughout the night.  She last threw up around 5 AM.  Her husband had went to go get some water and other things and heard patient knocking over things on the nightstand.  When he went back in the room patient was having a full blown generalized tonic-clonic seizure for which she says that she was foaming at the mouth and not responding.  EMS was called and was witnessed having a 2-minute seizure with them as well and given Versed.  Patient husband notes that she has been having the intermittent episodes that last several minutes with difficulty following commands, changes in speech, weakness, and personality changes over the last month.  Previously had been seizure-free since 2019 prior to seizures restarting on 12/15/2020.  She had been followed by Dr. Delice Lesch who had set her up with 48-hour EEG monitoring and wanted to obtain a MRI of the brain with and without contrast and her Keppra has been increased to 500 mg twice daily recently on 5/20.  ED Course: Upon admission to the emergency department patient was seen as a code stroke.  CT scan of the head without contrast noted no acute abnormalities.  Neurology evaluated the patient and suspected symptoms were related to seizure and findings.  Afebrile,.  Influenza and COVID-19 screening were negative.  CT angiogram of the head and neck with perfusion did not note any emergent large vessel occlusion, but did note ischemic CT perfusion values in  the posterior right hemisphere no correlated arterial stenosis or occlusion.  Patient was given Keppra 1500 mg IV and continued on home regimen dose of Keppra and phenobarbital.  TRH called to admit.originally, however EEG revealed active seizures and focal seizures  that did not resolve with Keppra x 2, Ativan 3 mg Phenytoin, and Phenobarbital. Pt is continuing to have L sided seizures.   Significant Hospital Events: Including procedures, antibiotic start and stop dates in addition to other pertinent events   01/15/2021 Admission to [facility]  01/06/2021 status epilepticus 6/8 ETT 6/9 had hyperkalemia, LP performed shows no infection 6/10 ongoing seizures noted on EEG Suspected Ab mediated encephalitis - work up thus far unremarkable.  CT Chest/A/P negative for occult malignancy.  6/11 seizures improved with solumedrol 6/13 commenced sedation weaning.  6/14 patient started to open eyes to commands  Interim History / Subjective:  Sedative infusions were weaned off yesterday. No further seizures.  Limited responsiveness still.   Objective   Blood pressure 104/65, pulse 93, temperature 99.5 F (37.5 C), temperature source Axillary, resp. rate (!) 26, height 5\' 3"  (1.6 m), weight 44 kg, SpO2 98 %.    Vent Mode: PRVC FiO2 (%):  [40 %] 40 % Set Rate:  [15 bmp] 15 bmp Vt Set:  [420 mL] 420 mL PEEP:  [5 cmH20] 5 cmH20 Pressure Support:  [10 cmH20] 10 cmH20 Plateau Pressure:  [14 cmH20-16 cmH20] 15 cmH20   Intake/Output Summary (Last 24 hours) at 01/21/2021 1049 Last data filed at 01/21/2021 0900 Gross  per 24 hour  Intake 2173.3 ml  Output 2325 ml  Net -151.7 ml    Filed Weights   01/18/21 0453 01/20/21 0500 01/21/21 0500  Weight: 44.6 kg 43.7 kg 44 kg    Examination: General: critically ill, intubated, sedated HENT: ETT to vent, EEG leads removed./  Lungs: ctab no wheezes or crackles. Tolerating PSV.  Cardiovascular: RRR, no murmurs Abdomen: soft, nontender nondistended Extremities:  Marked muscle waisting.  Neuro: Sedation is off. Limited response to pain. Weak eye opening. No limb movement.      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  - Continue feeding at goal.  - Increase insulin coverage.  - Continue PS ventilation as tolerated. Mental status will dictate timing of extubation.  - According to husband, poor pre-morbid quality of life, thus limited trial of aggressive treatment - no trach or PEG. If no improvement by 6/17, would pursue comfort care.  Long discussion with husband yesterday. He acknowledges that we have already gone beyond what she would have wished for. He will discuss the patient's condition with her mother and daughter with who she has had limited contact.     Best practice (right click and "Reselect all SmartList Selections" daily)  Diet:  NPO tube feeds Pain/Anxiety/Delirium protocol (if indicated): off all infusion. VAP protocol (if indicated): Yes DVT prophylaxis: Subcutaneous Heparin GI prophylaxis: PPI Glucose control:  SSI Yes - adding Detemir Central venous access:  Arterial line:  N/A Foley:  N/A - transition to external catheter Mobility:  bed rest  PT consulted: N/A Last date of multidisciplinary goals of care discussion:  6/15. DNR Code Status:  DNR Disposition: ICU  The patient is critically ill due to respiratory failure, status epilepticus.  Critical care was necessary to treat or prevent imminent or life-threatening deterioration.  Critical care was time spent personally by me on the following activities: development of treatment plan with patient and/or surrogate as well as nursing, discussions with consultants, evaluation of patient's response to treatment, examination of patient, obtaining history from patient or surrogate, ordering and performing treatments and interventions, ordering and review of laboratory studies, ordering and review of radiographic studies, pulse oximetry, re-evaluation of patient's condition and participation in multidisciplinary rounds.   Critical Care Time devoted to patient care services described in this note is 35 minutes. This time reflects time of care of this Dresser . This critical care time does not reflect separately billable procedures or procedure time, teaching time or supervisory time of PA/NP/Med student/Med Resident etc but could involve care discussion time.       Kipp Brood [place] [facility] 01/21/2021 10:49 AM  Pager: see AMION  If no response to pager , please call critical care on call (see AMION) until 7pm After 7:00 pm call Elink

## 2021-01-22 ENCOUNTER — Inpatient Hospital Stay (HOSPITAL_COMMUNITY): Payer: Managed Care, Other (non HMO)

## 2021-01-22 DIAGNOSIS — G40901 Epilepsy, unspecified, not intractable, with status epilepticus: Secondary | ICD-10-CM | POA: Diagnosis not present

## 2021-01-22 DIAGNOSIS — G40909 Epilepsy, unspecified, not intractable, without status epilepticus: Secondary | ICD-10-CM | POA: Diagnosis not present

## 2021-01-22 DIAGNOSIS — J9601 Acute respiratory failure with hypoxia: Secondary | ICD-10-CM | POA: Diagnosis not present

## 2021-01-22 LAB — BASIC METABOLIC PANEL
Anion gap: 8 (ref 5–15)
BUN: 20 mg/dL (ref 8–23)
CO2: 28 mmol/L (ref 22–32)
Calcium: 8.3 mg/dL — ABNORMAL LOW (ref 8.9–10.3)
Chloride: 100 mmol/L (ref 98–111)
Creatinine, Ser: 0.39 mg/dL — ABNORMAL LOW (ref 0.44–1.00)
GFR, Estimated: >60 mL/min (ref >60)
Glucose, Bld: 138 mg/dL — ABNORMAL HIGH (ref 70–99)
Potassium: 3.6 mmol/L (ref 3.5–5.1)
Sodium: 136 mmol/L (ref 135–145)

## 2021-01-22 LAB — GLUCOSE, CAPILLARY
Glucose-Capillary: 90 mg/dL (ref 70–99)
Glucose-Capillary: 113 mg/dL — ABNORMAL HIGH (ref 70–99)
Glucose-Capillary: 118 mg/dL — ABNORMAL HIGH (ref 70–99)
Glucose-Capillary: 89 mg/dL (ref 70–99)
Glucose-Capillary: 88 mg/dL (ref 70–99)
Glucose-Capillary: 116 mg/dL — ABNORMAL HIGH (ref 70–99)

## 2021-01-22 LAB — CBC WITH DIFFERENTIAL/PLATELET
Abs Immature Granulocytes: 0.14 10*3/uL — ABNORMAL HIGH (ref 0.00–0.07)
Basophils Absolute: 0 10*3/uL (ref 0.0–0.1)
Basophils Relative: 0 %
Eosinophils Absolute: 0 10*3/uL (ref 0.0–0.5)
Eosinophils Relative: 0 %
HCT: 31.1 % — ABNORMAL LOW (ref 36.0–46.0)
Hemoglobin: 10.4 g/dL — ABNORMAL LOW (ref 12.0–15.0)
Immature Granulocytes: 1 %
Lymphocytes Relative: 6 %
Lymphs Abs: 0.7 10*3/uL (ref 0.7–4.0)
MCH: 34.7 pg — ABNORMAL HIGH (ref 26.0–34.0)
MCHC: 33.4 g/dL (ref 30.0–36.0)
MCV: 103.7 fL — ABNORMAL HIGH (ref 80.0–100.0)
Monocytes Absolute: 1.1 10*3/uL — ABNORMAL HIGH (ref 0.1–1.0)
Monocytes Relative: 10 %
Neutro Abs: 9.3 10*3/uL — ABNORMAL HIGH (ref 1.7–7.7)
Neutrophils Relative %: 83 %
Platelets: 184 10*3/uL (ref 150–400)
RBC: 3 MIL/uL — ABNORMAL LOW (ref 3.87–5.11)
RDW: 13.3 % (ref 11.5–15.5)
WBC: 11.3 10*3/uL — ABNORMAL HIGH (ref 4.0–10.5)
nRBC: 0 % (ref 0.0–0.2)

## 2021-01-22 LAB — TRIGLYCERIDES: Triglycerides: 75 mg/dL (ref <150)

## 2021-01-22 LAB — PHENYTOIN LEVEL, TOTAL: Phenytoin Lvl: 4.1 ug/mL — ABNORMAL LOW (ref 10.0–20.0)

## 2021-01-22 MED ORDER — SODIUM CHLORIDE 0.9 % IV SOLN
180.0000 mg | Freq: Two times a day (BID) | INTRAVENOUS | Status: DC
  Administered 2021-01-22 – 2021-01-26 (×9): 180 mg via INTRAVENOUS
  Filled 2021-01-22 (×11): qty 3.6

## 2021-01-22 MED ORDER — PREDNISONE 20 MG PO TABS: 20.0000 mg | ORAL_TABLET | Freq: Every day | ORAL | Status: DC

## 2021-01-22 MED ORDER — PREDNISONE 20 MG PO TABS: 10.0000 mg | ORAL_TABLET | Freq: Every day | ORAL | Status: DC

## 2021-01-22 MED ORDER — PREDNISONE 20 MG PO TABS: 30.0000 mg | ORAL_TABLET | Freq: Every day | ORAL | Status: DC

## 2021-01-22 MED ORDER — PREDNISONE 20 MG PO TABS
50.0000 mg | ORAL_TABLET | Freq: Every day | ORAL | Status: DC
  Administered 2021-01-24 – 2021-01-26 (×3): 50 mg
  Filled 2021-01-22 (×3): qty 3

## 2021-01-22 MED ORDER — PREDNISONE 20 MG PO TABS
60.0000 mg | ORAL_TABLET | Freq: Every day | ORAL | Status: AC
  Administered 2021-01-22 – 2021-01-23 (×2): 60 mg
  Filled 2021-01-22: qty 3

## 2021-01-22 MED ORDER — PREDNISONE 20 MG PO TABS: 40.0000 mg | ORAL_TABLET | Freq: Every day | ORAL | Status: DC

## 2021-01-22 NOTE — Progress Notes (Signed)
Subjective: No clinical seizures overnight.  Patient more awake, following simple one-step midline commands today.  ROS: Unable to obtain due to poor mental status  Examination  Vital signs in last 24 hours: Temp:  [97.8 F (36.6 C)-101.1 F (38.4 C)] 98.6 F (37 C) (06/17 0800) Pulse Rate:  [78-101] 101 (06/17 1100) Resp:  [19-31] 31 (06/17 1100) BP: (102-146)/(63-81) 131/76 (06/17 1100) SpO2:  [93 %-100 %] 98 % (06/17 1100) FiO2 (%):  [40 %] 40 % (06/17 0800) Weight:  [44 kg] 44 kg (06/17 0500)  General: lying in bed, not in apparent distress CVS: pulse-normal rate and rhythm RS: Intubated, CTAB Extremities: normal, warm Neuro: Awake, opening eyes spontaneously, able to look at examiner, follows simple commands like opening and closing her eyes, PERRLA, blinks to threat bilaterally, gag reflex intact, does not withdraw to noxious stimuli in all 4 extremities    Basic Metabolic Panel: Recent Labs  Lab 01/15/21 1700 01/15/21 1700 01/16/21 0435 01/16/21 2350 01/17/21 0412 01/17/21 1046 01/18/21 0343 01/19/21 0429 01/20/21 0530 01/22/21 0759  NA  --    < > 136  --  134*  --  135 134* 137 136  K  --    < > 3.2*  --  4.4  --  3.6 3.4* 3.4* 3.6  CL  --    < > 103  --  105  --  101 99 99 100  CO2  --    < > 23  --  22  --  25 26 27 28   GLUCOSE  --    < > 210*  --  298*  --  219* 250* 97 138*  BUN  --    < > 6*  --  7*  --  15 19 25* 20  CREATININE  --    < > 0.37*  --  0.44  --  0.43* 0.39* 0.37* 0.39*  CALCIUM  --    < > 7.4*  --  7.1*  --  7.3* 7.8* 7.9* 8.3*  MG 2.3  --  2.1  --   --   --   --   --   --   --   PHOS >30.0*  --  <1.0* <1.0*  --  2.4*  --  1.7* 2.0*  --    < > = values in this interval not displayed.    CBC: Recent Labs  Lab 01/17/21 0412 01/18/21 0343 01/19/21 0429 01/20/21 0530 01/22/21 0759  WBC 21.1* 18.9* 14.0* 13.4* 11.3*  NEUTROABS  --   --   --   --  9.3*  HGB 11.5* 11.4* 11.6* 11.7* 10.4*  HCT 33.8* 33.5* 34.2* 34.5* 31.1*  MCV 102.4*  102.4* 102.1* 101.8* 103.7*  PLT 364 320 289 282 184     Coagulation Studies: No results for input(s): LABPROT, INR in the last 72 hours.  Imaging No new brain imaging overnight   ASSESSMENT AND PLAN:  70 year old female who presented with focal convulsive status epilepticus which was refractory to Versed, ketamine and multiple AEDs.  Lumbar puncture was performed on 01/14/2021, no CSF pleocytosis was seen, CSF and serum was sent to [facility] for autoimmune and paraneoplastic work-up and patient was started on IV Solu-Medrol 1000 mg after which EEG showed significant improvement.    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   -Patient has been intubated on high-dose  Versed and ketamine followed by steroids for over a week likely contributing to myopathy   Recommendations -Continue  Keppra 2000 mg twice daily, Vimpat 100 mg twice daily, perampanel 8 mg nightly, phenobarbital 65mg  BID and pregabalin 100mg  BID  -Increase Dilantin to 180 mg twice daily per pharmacy recommendation -Completed Solu-Medrol 1000 mg for 5 days on 01/18/2021, now on prednisone taper. 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  However, per husband patient was declining since her previous admission and therefore was very clear that she would not want to be in a long-term acute care, trach/feeding tube.  I have discussed with patient's husband that patient will likely have improvement in neurological status.  However, the recovery may be prolonged.  For now, the plan is to monitor improvement in neurologic status knowing that patient would not want  trach/PEG.  I have spent a total of  35 minutes with the patient reviewing hospital notes,  test results, labs and examining the patient as well as establishing an assessment and plan.  > 50% of time was spent in direct patient care.   Zeb Comfort Epilepsy [facility] For questions after 5pm please refer to AMION to reach the Neurologist on call

## 2021-01-22 NOTE — Progress Notes (Addendum)
NAME:  Samantha Atkins, MRN:  [id number], DOB:  [birth year], LOS: 64 ADMISSION DATE:  01/23/2021, CONSULTATION DATE:  01/06/2021 REFERRING MD:  Tamala Julian, CHIEF COMPLAINT:  Recurrent Seizures   History of Present Illness:  Samantha Atkins is a 71 y.o. female with medical history significant for  seizures, anxiety, depression, and ovarian cancer who presented for seizure.  History is obtained from the patient's husband is present at bedside.  Apparently around 7 PM 6/6 patient began to have several episodes of nausea and vomiting throughout the night.  She last threw up around 5 AM.  Her husband had went to go get some water and other things and heard patient knocking over things on the nightstand.  When he went back in the room patient was having a full blown generalized tonic-clonic seizure for which she says that she was foaming at the mouth and not responding.  EMS was called and was witnessed having a 2-minute seizure with them as well and given Versed.  Patient husband notes that she has been having the intermittent episodes that last several minutes with difficulty following commands, changes in speech, weakness, and personality changes over the last month.  Previously had been seizure-free since 2019 prior to seizures restarting on 12/15/2020.  She had been followed by Dr. Delice Lesch who had set her up with 48-hour EEG monitoring and wanted to obtain a MRI of the brain with and without contrast and her Keppra has been increased to 500 mg twice daily recently on 5/20.  ED Course: Upon admission to the emergency department patient was seen as a code stroke.  CT scan of the head without contrast noted no acute abnormalities.  Neurology evaluated the patient and suspected symptoms were related to seizure and findings.  Afebrile,.  Influenza and COVID-19 screening were negative.  CT angiogram of the head and neck with perfusion did not note any emergent large vessel occlusion, but did note ischemic CT perfusion values in the  posterior right hemisphere no correlated arterial stenosis or occlusion.  Patient was given Keppra 1500 mg IV and continued on home regimen dose of Keppra and phenobarbital.  TRH called to admit.originally, however EEG revealed active seizures and focal seizures  that did not resolve with Keppra x 2, Ativan 3 mg Phenytoin, and Phenobarbital. Pt is continuing to have L sided seizures.   Significant Hospital Events: Including procedures, antibiotic start and stop dates in addition to other pertinent events   02/03/2021 Admission to [facility]  01/09/2021 status epilepticus 6/8 ETT 6/9 had hyperkalemia, LP performed shows no infection 6/10 ongoing seizures noted on EEG Suspected Ab mediated encephalitis - work up thus far unremarkable.  CT Chest/A/P negative for occult malignancy.  6/11 seizures improved with solumedrol 6/13 commenced sedation weaning.  6/14 patient started to open eyes to commands 6/15 Sedative infusions were weaned off   Interim History / Subjective:   Remains critically ill, intubated Febrile 101 at 7 PM but defervesced after. More awake this morning   Objective   Blood pressure 135/75, pulse 100, temperature 98.6 F (37 C), temperature source Oral, resp. rate (!) 31, height 5\' 3"  (1.6 m), weight 44 kg, SpO2 98 %.    Vent Mode: PSV;CPAP FiO2 (%):  [40 %] 40 % Set Rate:  [15 bmp] 15 bmp Vt Set:  [420 mL] 420 mL PEEP:  [5 cmH20] 5 cmH20 Pressure Support:  [5 cmH20] 5 cmH20 Plateau Pressure:  [12 cmH20-15 cmH20] 13 cmH20   Intake/Output Summary (Last 24 hours)  at 01/22/2021 0956 Last data filed at 01/22/2021 0900 Gross per 24 hour  Intake 1721.83 ml  Output 2050 ml  Net -328.17 ml    Filed Weights   01/20/21 0500 01/21/21 0500 01/22/21 0500  Weight: 43.7 kg 44 kg 44 kg    Examination: General: critically ill, intubated, appears acutely ill HENT: ETT to vent, EEG leads removed./  Lungs: No accessory muscle use, clear breath sounds bilateral, minimal  secretions Cardiovascular: RRR, no murmurs Abdomen: soft, nontender nondistended Extremities: Marked muscle waisting.  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 encephalitis /paraneoplastic Ab panel.  -Continue steroid with slow taper per neurology, this could certainly be related to her known prior seizure disorder   Chronic HFpEF Cachexia   Acute respiratory failure -intubated for airway protection but now has flaccid and weak extremities  -Tolerating pressure support 10/5 will reduce to 5/5 and give her 2 to 3 days of aggressive PT to see if we can optimize her prior to extubation   Leukocytosis - steroid induced and improving Fever 6/16 -no evidence of infection  Steroid induced hyperglycemia - Continue feeding at goal.  -Decrease insulin dosage as steroid is being tapered  Goals of care : - According to husband, poor pre-morbid quality of life, thus limited trial of aggressive treatment - no trach or PEG.  He acknowledges that we have already gone beyond what she would have wished for.  However given improvement on 6/17, it seems reasonable to give her some more time.  He agrees to one-way extubation when medically optimized.      Best practice (right click and "Reselect all SmartList Selections" daily)  Diet:  NPO tube feeds Pain/Anxiety/Delirium protocol (if indicated): Fentanyl as  needed VAP protocol (if indicated): Yes DVT prophylaxis: Subcutaneous Heparin GI prophylaxis: PPI Glucose control:  SSI Yes - adding Detemir Central venous access:  Arterial line:  N/A Foley:  N/A - transition to external catheter Mobility:  bed rest  PT consulted: N/A Last date of multidisciplinary goals of care discussion: 6/15. DNR Code Status:  DNR Disposition: ICU    Patient critically ill due to status epilepticus, encephalopathy, respiratory failure Interventions to address this today vent weaning, goals of care discussion , titrating anticonvulsants Risk of deterioration without these interventions is high  I personally spent 34 minutes providing critical care not including any separately billable procedures      Shaketta Rill [facility] 01/22/2021 9:56 AM  Pager: see AMION  If no response to pager , please call critical care on call (see AMION) until 7pm After 7:00 pm call Elink

## 2021-01-22 NOTE — Progress Notes (Signed)
Phenytoin Follow-Up Consult Indication: focal convulsive SE > nonconvulsive focal SE + concern autoimmune enceph vs. neoplastic/paraneoplastic causes  Allergies  Allergen Reactions   Dust Mite Extract Cough    Patient Measurements: Height: 5\' 3"  (160 cm) Weight: 44 kg (97 lb) IBW/kg (Calculated) : 52.4   Body mass index is 17.18 kg/m.   Vital signs: Temp: 98.5 F (36.9 C) (06/17 0400) Temp Source: Oral (06/17 0400) BP: 131/76 (06/17 0700) Pulse Rate: 101 (06/17 0700)  Labs: Lab Results  Component Value Date/Time   Phenytoin Lvl 4.1 (L) 01/22/2021 0500   Lab Results  Component Value Date   PHENYTOIN 4.1 (L) 01/22/2021   Estimated Creatinine Clearance: 46.1 mL/min (A) (by C-G formula based on SCr of 0.37 mg/dL (L)).    Neuro: focal convulsive SE > nonconvulsive focal SE + concern autoimmune enceph vs. neoplastic/paraneoplastic causes - PHB lvl 14.2 on admin - 6/10 (D4) Phy lvl 7.5, alb 2.9 - corr 11.03 (micromedex calc) > con't - 6/12 (D6) Phy lvl 8, alb 2.2 - corr 14.8 (micromedex calc) > continue  and PHB lvl 20.1 - LP (traumatic tap): not indicative bact/viral meningitis (mo pleocytosis, protein/glu wnl) - 6/15: PB 23.5, Yadava decr PB 65mg  BID - 6/17: Phenytoin 4.1 (adj 7.5 Global RPH): incr 180mg /d  AED regimen: - PTA Keppra 1000 BID > 2000 BID on 6/8 - Phenytoin 150 BID>>6/17 incr 180mg  BID - PTA PHB 64.8 BID > PHB 130mg  IVP on 6/8>>6/13 incr 160mg  BID, 6/16 decr 65mg  BID - lacosamide 2mg  IV q12h - lyrica > inc 6/10, 200mg  BID, 6/16 decr 100 BID - Fycompa 12mg  x1 on 6/8 then 4mg  daily (dosed for inducers present) > inc 6/10> Incr 6/13 8mg /hs  - Versed gtt off 6/13, (prop gtt off due to hypotension) + ketamine off 6/13  - Solumedrol 1000mg  x 5d (6/9>6/13), EEG showed significant improvement afterward so now on prednisone taper - 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 Start Pyridoxine 600mg  IV daily for 2 doses. 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   Goals of care:  Total phenytoin level: 10-20 mcg/ml Free phenytoin level: 1-2 mcg/ml  Plan:  Increase Phenytoin to 180mg  IV BID CMET and Phenytoin level in 1 week.   Samantha Atkins, PharmD, BCPS Clinical Staff Pharmacist [email or web address]  Samantha Atkins, [place] 01/22/2021,7:48 AM

## 2021-01-22 NOTE — Procedures (Signed)
Cortrak  Person Inserting Tube:  Fabrizio Filip, RD Tube Type:  Cortrak - 43 inches Tube Size:  10 Tube Location:  Right nare Initial Placement:  Stomach Secured by: Bridle Technique Used to Measure Tube Placement:  Marking at nare/corner of mouth Cortrak Secured At:  68 cm  Cortrak Tube Team Note:  Consult received to place a Cortrak feeding tube.   X-ray is required, abdominal x-ray has been ordered by the Cortrak team. Please confirm tube placement before using the Cortrak tube.   If the tube becomes dislodged please keep the tube and contact the Cortrak team at [email or web address] (password [email or web address]) for replacement.  If after hours and replacement cannot be delayed, place a NG tube and confirm placement with an abdominal x-ray.     Cecia Egge MS, RDN, LDN, CNSC Registered Dietitian III Clinical Nutrition RD Pager and On-Call Pager Number Located in Amion    

## 2021-01-23 DIAGNOSIS — L8990 Pressure ulcer of unspecified site, unspecified stage: Secondary | ICD-10-CM | POA: Insufficient documentation

## 2021-01-23 DIAGNOSIS — J9601 Acute respiratory failure with hypoxia: Secondary | ICD-10-CM | POA: Diagnosis not present

## 2021-01-23 DIAGNOSIS — G40909 Epilepsy, unspecified, not intractable, without status epilepticus: Secondary | ICD-10-CM | POA: Diagnosis not present

## 2021-01-23 LAB — GLUCOSE, CAPILLARY
Glucose-Capillary: 93 mg/dL (ref 70–99)
Glucose-Capillary: 86 mg/dL (ref 70–99)
Glucose-Capillary: 122 mg/dL — ABNORMAL HIGH (ref 70–99)
Glucose-Capillary: 110 mg/dL — ABNORMAL HIGH (ref 70–99)
Glucose-Capillary: 122 mg/dL — ABNORMAL HIGH (ref 70–99)
Glucose-Capillary: 94 mg/dL (ref 70–99)

## 2021-01-23 LAB — BASIC METABOLIC PANEL
Anion gap: 9 (ref 5–15)
BUN: 20 mg/dL (ref 8–23)
CO2: 27 mmol/L (ref 22–32)
Calcium: 7.9 mg/dL — ABNORMAL LOW (ref 8.9–10.3)
Chloride: 100 mmol/L (ref 98–111)
Creatinine, Ser: 0.4 mg/dL — ABNORMAL LOW (ref 0.44–1.00)
GFR, Estimated: >60 mL/min (ref >60)
Glucose, Bld: 108 mg/dL — ABNORMAL HIGH (ref 70–99)
Potassium: 4 mmol/L (ref 3.5–5.1)
Sodium: 136 mmol/L (ref 135–145)

## 2021-01-23 LAB — CBC
HCT: 30.5 % — ABNORMAL LOW (ref 36.0–46.0)
Hemoglobin: 10.3 g/dL — ABNORMAL LOW (ref 12.0–15.0)
MCH: 34.4 pg — ABNORMAL HIGH (ref 26.0–34.0)
MCHC: 33.8 g/dL (ref 30.0–36.0)
MCV: 102 fL — ABNORMAL HIGH (ref 80.0–100.0)
Platelets: 215 10*3/uL (ref 150–400)
RBC: 2.99 MIL/uL — ABNORMAL LOW (ref 3.87–5.11)
RDW: 13.2 % (ref 11.5–15.5)
WBC: 13.3 10*3/uL — ABNORMAL HIGH (ref 4.0–10.5)
nRBC: 0 % (ref 0.0–0.2)

## 2021-01-23 NOTE — Evaluation (Signed)
Physical Therapy Evaluation Patient Details Name: Samantha Atkins MRN: [id number] DOB: [birth year] Today's Date: 01/23/2021   History of Present Illness  Pt is 70 yo female admitted 6/6 after witnessed tonic clonic seizure at home and with EMS. These were preceded by nausea and vomiting. CT of head neg for acute events. Pt intubated 6/8, ongoing seizures noted through 6/10, improved with medical management. PMH: anxiety, depression, seizures, ovarian cancer.   Clinical Impression  Pt in bed upon arrival of PT, agreeable to evaluation at this time. Prior to admission the pt was mobilizing with use of RW or no AD, but per spouse, the pt has had many falls in last 6 months, ~1 per week. She has needed increased assist for ADLs in last month. The pt now presents with limitations in functional mobility, strength, command following, cognition, and endurance due to above dx, and will continue to benefit from skilled PT to address these deficits. The pt was unable to consistently use blinking for communication this session, as she was unable to perform a given number of blinks when asked to do more than one, and did not give any no signals (two blinks) during my session, even in response to questions such as "are you at home?" The pt was able to tolerate chair egress to sitting with VSS, no apparent distress, and was able to sustain long-sitting with total support at trunk and max cues to maintain head upright without support. The pt did follow cues to look to R or L with her eyes, and attempted to turn her head to the L and R (better to R). No other active movement or command following noted this session. Pt family educated on ROM HEP for all extremities. Will continue to benefit from skilled PT to progress strength, activity tolerance, and command following.      Follow Up Recommendations SNF (vs. LTACH pending pt respiratory status)    Equipment Recommendations   (defer to post acute)    Recommendations for  Other Services       Precautions / Restrictions Precautions Precautions: Fall Precaution Comments: vent, flexiseal, NG tube, seizures Required Braces or Orthoses: Other Brace Other Brace: bilateral PRAFOs Restrictions Weight Bearing Restrictions: No      Mobility  Bed Mobility Overal bed mobility: Needs Assistance Bed Mobility: Supine to Sit     Supine to sit: Total assist;HOB elevated     General bed mobility comments: totaA  with support of bed to come to sit, totalA to hold pt in long sitting, minA to modA to maintain head position, pt able to initiate movement of head when cued to hold her head up    Transfers                 General transfer comment: deferred for pt safety      Balance Overall balance assessment: Needs assistance;History of Falls   Sitting balance-Leahy Scale: Zero Sitting balance - Comments: totalA to maintain upright                                     Pertinent Vitals/Pain      Home Living Family/patient expects to be discharged to:: Private residence Living Arrangements: Spouse/significant other Available Help at Discharge: Family;Available 24 hours/day Type of Home: House Home Access: Stairs to enter Entrance Stairs-Rails: None Entrance Stairs-Number of Steps: 1 Home Layout: One level Home Equipment: Walker - 2 wheels;Bedside commode  Prior Function Level of Independence: Needs assistance   Gait / Transfers Assistance Needed: walking with RW 50% of time, 50% without AD, but husband reports ~1 fall per week  ADL's / Homemaking Assistance Needed: last month needed assist with showering and dressing.        Hand Dominance   Dominant Hand: Right    Extremity/Trunk Assessment   Upper Extremity Assessment Upper Extremity Assessment: Generalized weakness;Difficult to assess due to impaired cognition (no active movement noted, slight increase in tone for LUE with AROM elbow flexion)    Lower  Extremity Assessment Lower Extremity Assessment: Generalized weakness;Difficult to assess due to impaired cognition (no active movement to command, no noted tone or limitations in ROM)    Cervical / Trunk Assessment Cervical / Trunk Assessment: Other exceptions Cervical / Trunk Exceptions: pt noticably weak, able to hold head without support 2-3 seconds max. able to turn to R on command, initiates movement with eyes and attempts to turn head to L  Communication   Communication: Other (comment) (intubated)  Cognition Arousal/Alertness: Awake/alert Behavior During Therapy: Flat affect Overall Cognitive Status: Difficult to assess Area of Impairment: Following commands;Orientation                 Orientation Level: Disoriented to;Place;Situation     Following Commands: Follows one step commands inconsistently       General Comments: pt does seem to attempt communication through blinking, but is not consistent with yes/no blinking. assessment may also be limited by potential confusion. Pt not giving any no signals during session despite being asked multiple questions with no answers "is this your brother?" (husband) and "are you at home". pt also unable to blink to command, blinking once when asked to blink 3 times. Pt is able to follow simple commands such as look to the R or L when given verbal and visual cues      General Comments General comments (skin integrity, edema, etc.): VSS with chair egress to sitting    Exercises General Exercises - Upper Extremity Shoulder Flexion: PROM;Both;5 reps;Seated Shoulder ABduction: PROM;Both;5 reps;Supine Elbow Flexion: PROM;Both;10 reps;Supine Elbow Extension: PROM;Both;10 reps;Supine Wrist Flexion: PROM;Both;10 reps;Supine Wrist Extension: PROM;Both;10 reps;Supine General Exercises - Lower Extremity Ankle Circles/Pumps: PROM;Both;10 reps;Supine Heel Slides: PROM;Both;10 reps;Supine Hip ABduction/ADduction: PROM;Both;10 reps;Supine    Assessment/Plan    PT Assessment Patient needs continued PT services  PT Problem List Decreased strength;Decreased range of motion;Decreased activity tolerance;Decreased balance;Decreased mobility;Decreased cognition       PT Treatment Interventions DME instruction;Gait training;Functional mobility training;Therapeutic activities;Therapeutic exercise;Balance training;Neuromuscular re-education;Patient/family education    PT Goals (Current goals can be found in the Care Plan section)  Acute Rehab PT Goals Patient Stated Goal: none stated, spouse would like for pt to get stronger PT Goal Formulation: With patient Time For Goal Achievement: 02/06/21 Potential to Achieve Goals: Fair    Frequency Min 3X/week    AM-PAC PT "6 Clicks" Mobility  Outcome Measure Help needed turning from your back to your side while in a flat bed without using bedrails?: Total Help needed moving from lying on your back to sitting on the side of a flat bed without using bedrails?: Total Help needed moving to and from a bed to a chair (including a wheelchair)?: Total Help needed standing up from a chair using your arms (e.g., wheelchair or bedside chair)?: Total Help needed to walk in hospital room?: Total Help needed climbing 3-5 steps with a railing? : Total 6 Click Score: 6    End of  Session Equipment Utilized During Treatment: Oxygen (vent, FiO2 40% PEEP 5) Activity Tolerance: Patient tolerated treatment well Patient left: in bed;with family/visitor present;with SCD's reapplied Nurse Communication: Mobility status PT Visit Diagnosis: Muscle weakness (generalized) (M62.81)    Time: 8003-4917 PT Time Calculation (min) (ACUTE ONLY): 40 min   Charges:   PT Evaluation $PT Eval Moderate Complexity: 1 Mod PT Treatments $Therapeutic Exercise: 8-22 mins $Therapeutic Activity: 8-22 mins        Karma Ganja, PT, DPT   Acute Rehabilitation Department Pager #: [phone]  Otho Bellows 01/23/2021, 2:32 PM

## 2021-01-23 NOTE — Progress Notes (Signed)
NAME:  Samantha Atkins, MRN:  [id number], DOB:  11/07/1950, LOS: 53 ADMISSION DATE:  01/09/2021, CONSULTATION DATE:  01/26/2021 REFERRING MD:  Tamala Julian, CHIEF COMPLAINT:  Recurrent Seizures   History of Present Illness:  Samantha Atkins is a 70 y.o. female with medical history significant for  seizures, anxiety, depression, and ovarian cancer who presented for seizure.  History is obtained from the patient's husband is present at bedside.  Apparently around 7 PM 6/6 patient began to have several episodes of nausea and vomiting throughout the night.  She last threw up around 5 AM.  Her husband had went to go get some water and other things and heard patient knocking over things on the nightstand.  When he went back in the room patient was having a full blown generalized tonic-clonic seizure for which she says that she was foaming at the mouth and not responding.  EMS was called and was witnessed having a 2-minute seizure with them as well and given Versed.  Patient husband notes that she has been having the intermittent episodes that last several minutes with difficulty following commands, changes in speech, weakness, and personality changes over the last month.  Previously had been seizure-free since 2019 prior to seizures restarting on 12/15/2020.  She had been followed by Dr. Delice Lesch who had set her up with 48-hour EEG monitoring and wanted to obtain a MRI of the brain with and without contrast and her Keppra has been increased to 500 mg twice daily recently on 5/20.  ED Course: Upon admission to the emergency department patient was seen as a code stroke.  CT scan of the head without contrast noted no acute abnormalities.  Neurology evaluated the patient and suspected symptoms were related to seizure and findings.  Afebrile,.  Influenza and COVID-19 screening were negative.  CT angiogram of the head and neck with perfusion did not note any emergent large vessel occlusion, but did note ischemic CT perfusion values in the  posterior right hemisphere no correlated arterial stenosis or occlusion.  Patient was given Keppra 1500 mg IV and continued on home regimen dose of Keppra and phenobarbital.  [facility] called to admit.originally, however EEG revealed active seizures and focal seizures  that did not resolve with Keppra x 2, Ativan 3 mg Phenytoin, and Phenobarbital. Pt is continuing to have L sided seizures.   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  CT Chest/A/P negative for occult malignancy.  6/11 seizures improved with solumedrol 6/13 commenced sedation weaning.  6/14 patient started to open eyes to commands 6/15 Sedative infusions were weaned off   Interim History / Subjective:   No overnight event, patient remain generalized weak Tolerating pressure support trial this morning, more awake  Objective   Blood pressure (!) 141/79, pulse 99, temperature 98.3 F (36.8 C), temperature source Axillary, resp. rate (!) 32, height 5\' 3"  (1.6 m), weight 39.2 kg, SpO2 95 %.    Vent Mode: PSV;CPAP FiO2 (%):  [40 %] 40 % Set Rate:  [15 bmp] 15 bmp Vt Set:  [420 mL] 420 mL PEEP:  [5 cmH20] 5 cmH20 Pressure Support:  [5 cmH20-10 cmH20] 10 cmH20 Plateau Pressure:  [13 cmH20-15 cmH20] 13 cmH20   Intake/Output Summary (Last 24 hours)  at 01/23/2021 1151 Last data filed at 01/23/2021 1000 Gross per 24 hour  Intake 1254.14 ml  Output 2100 ml  Net -845.86 ml   Filed Weights   01/21/21 0500 01/22/21 0500 01/23/21 0400  Weight: 44 kg 44 kg 39.2 kg    Examination: General: Critically ill-appearing elderly Caucasian female, orally intubated HENT: Atraumatic, normocephalic, no JVD heated ETT and OGT in place Lungs: No  accessory muscle use, clear breath sounds bilateral, minimal secretions Cardiovascular: RRR, no murmurs, no murmur Abdomen: soft, nontender nondistended Extremities: Marked muscle waisting.  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   Chronic HFpEF Cachexia Nutritionist follow-up Continue tube feeds  Acute respiratory failure -intubated for airway protection but now has flaccid and weak extremities Tolerating pressure support trial 5/5, remain generalized weak Will give her 2 to 3 days of aggressive PT to see if we can optimize her prior to extubation As patient has clear wishes not to have tracheostomy  Steroid induced hyperglycemia Continue feeding at goal.  Decrease insulin dosage as steroid is being tapered  Goals of care : -According to husband, poor pre-morbid quality of life, thus limited trial of aggressive treatment -no trach or PEG.  He acknowledges that we have already gone beyond what she would have wished for.  However given improvement on 6/17, it seems reasonable to give her some more time.  He agrees to one-way extubation when medically optimized.      Best practice (right click and "Reselect all SmartList  Selections" daily)  Diet:  NPO tube feeds Pain/Anxiety/Delirium protocol (if indicated): Fentanyl as needed VAP protocol (if indicated): Yes DVT prophylaxis: Subcutaneous Heparin GI prophylaxis: PPI Glucose control:  SSI Yes - adding Detemir Central venous access: N/A Arterial line:  N/A Foley:  N/A - transition to external catheter Mobility:  bed rest  PT consulted: N/A Last date of multidisciplinary goals of care discussion: 6/15. DNR Code Status:  DNR Disposition: ICU   Total critical care time: 35 minutes  Performed by: [facility] care time was exclusive of separately billable procedures and treating other patients.   Critical care was necessary to treat or prevent imminent or life-threatening deterioration.   Critical care was time spent personally by me on the following activities: development of treatment plan with patient and/or surrogate as well as nursing, discussions with consultants, evaluation of patient's response to treatment, examination of patient, obtaining history from patient or surrogate, ordering and performing treatments and interventions, ordering and review of laboratory studies, ordering and review of radiographic studies, pulse oximetry and re-evaluation of patient's condition.   Jacky Kindle MD [facility] See Amion for pager If no response to pager, please call [phone] until 7pm After 7pm, Please call E-link [phone]

## 2021-01-24 DIAGNOSIS — G40909 Epilepsy, unspecified, not intractable, without status epilepticus: Secondary | ICD-10-CM | POA: Diagnosis not present

## 2021-01-24 LAB — GLUCOSE, CAPILLARY
Glucose-Capillary: 80 mg/dL (ref 70–99)
Glucose-Capillary: 95 mg/dL (ref 70–99)
Glucose-Capillary: 169 mg/dL — ABNORMAL HIGH (ref 70–99)
Glucose-Capillary: 155 mg/dL — ABNORMAL HIGH (ref 70–99)
Glucose-Capillary: 91 mg/dL (ref 70–99)

## 2021-01-24 LAB — BASIC METABOLIC PANEL
Anion gap: 9 (ref 5–15)
BUN: 24 mg/dL — ABNORMAL HIGH (ref 8–23)
CO2: 26 mmol/L (ref 22–32)
Calcium: 8.2 mg/dL — ABNORMAL LOW (ref 8.9–10.3)
Chloride: 101 mmol/L (ref 98–111)
Creatinine, Ser: 0.44 mg/dL (ref 0.44–1.00)
GFR, Estimated: >60 mL/min (ref >60)
Glucose, Bld: 146 mg/dL — ABNORMAL HIGH (ref 70–99)
Potassium: 3.6 mmol/L (ref 3.5–5.1)
Sodium: 136 mmol/L (ref 135–145)

## 2021-01-24 MED ORDER — FUROSEMIDE 10 MG/ML IJ SOLN
40.0000 mg | Freq: Once | INTRAMUSCULAR | Status: AC
  Administered 2021-01-24: 40 mg via INTRAVENOUS
  Filled 2021-01-24: qty 4

## 2021-01-24 NOTE — Progress Notes (Signed)
Increased IVIG to 79mL/hr   01/24/21 1500  Vitals  Temp 97.8 F (36.6 C)  Temp Source Axillary  BP 118/75  MAP (mmHg) 87  Pulse Rate 99  ECG Heart Rate 99  Resp (!) 33  Oxygen Therapy  SpO2 96 %

## 2021-01-24 NOTE — Progress Notes (Signed)
Pt placed on PSV 10/5 per wean protocol and is tolerating well at this time. RT to continue to monitor. RN made aware.

## 2021-01-24 NOTE — Progress Notes (Signed)
NAME:  Samantha Atkins, MRN:  [id number], DOB:  1951-02-05, LOS: 12 ADMISSION DATE:  01/25/2021, CONSULTATION DATE:  01/14/2021 REFERRING MD:  Tamala Julian, CHIEF COMPLAINT:  Recurrent Seizures   History of Present Illness:  Samantha Atkins is a 70 y.o. female with medical history significant for  seizures, anxiety, depression, and ovarian cancer who presented for seizure.  History is obtained from the patient's husband is present at bedside.  Apparently around 7 PM 6/6 patient began to have several episodes of nausea and vomiting throughout the night.  She last threw up around 5 AM.  Her husband had went to go get some water and other things and heard patient knocking over things on the nightstand.  When he went back in the room patient was having a full blown generalized tonic-clonic seizure for which she says that she was foaming at the mouth and not responding.  EMS was called and was witnessed having a 2-minute seizure with them as well and given Versed.  Patient husband notes that she has been having the intermittent episodes that last several minutes with difficulty following commands, changes in speech, weakness, and personality changes over the last month.  Previously had been seizure-free since 2019 prior to seizures restarting on 12/15/2020.  She had been followed by Dr. Delice Lesch who had set her up with 48-hour EEG monitoring and wanted to obtain a MRI of the brain with and without contrast and her Keppra has been increased to 500 mg twice daily recently on 5/20.  ED Course: Upon admission to the emergency department patient was seen as a code stroke.  CT scan of the head without contrast noted no acute abnormalities.  Neurology evaluated the patient and suspected symptoms were related to seizure and findings.  Afebrile,.  Influenza and COVID-19 screening were negative.  CT angiogram of the head and neck with perfusion did not note any emergent large vessel occlusion, but did note ischemic CT perfusion values in the  posterior right hemisphere no correlated arterial stenosis or occlusion.  Patient was given Keppra 1500 mg IV and continued on home regimen dose of Keppra and phenobarbital.  TRH called to admit.originally, however EEG revealed active seizures and focal seizures  that did not resolve with Keppra x 2, Ativan 3 mg Phenytoin, and Phenobarbital. Pt is continuing to have L sided seizures.   Significant Hospital Events: Including procedures, antibiotic start and stop dates in addition to other pertinent events   01/11/2021 Admission to [facility]  01/26/2021 status epilepticus 6/8 ETT 6/9 had hyperkalemia, LP performed shows no infection 6/10 ongoing seizures noted on EEG Suspected Ab mediated encephalitis - work up thus far unremarkable.  CT Chest/A/P negative for occult malignancy.  6/11 seizures improved with solumedrol 6/13 commenced sedation weaning.  6/14 patient started to open eyes to commands 6/15 Sedative infusions were weaned off  6/19 Follows commands, tries to move R arm, does not move other extremities  Interim History / Subjective:   No overnight event, profound weakness. Tachypnea on PSV 10/5  Objective   Blood pressure 123/75, pulse 94, temperature 98.5 F (36.9 C), temperature source Axillary, resp. rate (!) 29, height 5\' 3"  (1.6 m), weight 39.5 kg, SpO2 99 %.    Vent Mode: PSV;CPAP FiO2 (%):  [40 %] 40 % Set Rate:  [15 bmp] 15 bmp Vt Set:  [420 mL] 420 mL PEEP:  [5 cmH20] 5 cmH20 Pressure Support:  [10 cmH20] 10 cmH20 Plateau Pressure:  [14 cmH20-15 cmH20] 14 cmH20  Intake/Output Summary (Last 24 hours) at 01/24/2021 1118 Last data filed at 01/24/2021 0800 Gross per 24 hour  Intake 1526.55 ml  Output 550 ml  Net 976.55 ml    Filed Weights   01/22/21 0500 01/23/21 0400 01/24/21 0500  Weight: 44 kg 39.2 kg 39.5 kg    Examination: General: Critically ill-appearing elderly Caucasian female, orally intubated HENT: Atraumatic, normocephalic, no JVD heated ETT and OGT in  place Lungs: No accessory muscle use, clear breath sounds bilateral, minimal secretions Cardiovascular: RRR, no murmurs, no murmur Abdomen: soft, nontender nondistended Extremities: Marked muscle waisting.  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   Chronic HFpEF Cachexia Nutritionist follow-up Continue tube feeds Diurese  Acute respiratory failure -intubated for airway protection but now has flaccid and weak extremities Tolerating pressure support trial 10/5, remain generalized weak Will give her 2 to 3 days of aggressive PT to see if we can optimize her prior to extubation As patient has clear wishes not to have tracheostomy, given profound weakness feel weaning is unlikely Diurese  Steroid induced hyperglycemia Continue feeding at goal.  Decrease insulin dosage as steroid is being tapered  Goals of care : -According to husband, poor pre-morbid quality of life, thus limited trial of aggressive treatment -no trach or PEG.  He acknowledges that we have already gone beyond what she would have wished for.  However given improvement on 6/17, it seems reasonable to give her some more time.  He agrees to one-way extubation when medically optimized.      Best practice (right click and "Reselect  all SmartList Selections" daily)  Diet:  NPO tube feeds Pain/Anxiety/Delirium protocol (if indicated): Fentanyl as needed VAP protocol (if indicated): Yes DVT prophylaxis: Subcutaneous Heparin GI prophylaxis: PPI Glucose control:  SSI Yes - adding Detemir Central venous access: N/A Arterial line:  N/A Foley:  N/A - transition to external catheter Mobility:  bed rest  PT consulted: N/A Last date of multidisciplinary goals of care discussion: 6/15. DNR Code Status:  DNR Disposition: ICU   Total critical care time: 35 minutes  Performed by: Lanier Clam   Critical care time was exclusive of separately billable procedures and treating other patients.   Critical care was necessary to treat or prevent imminent or life-threatening deterioration.   Critical care was time spent personally by me on the following activities: development of treatment plan with patient and/or surrogate as well as nursing, discussions with consultants, evaluation of patient's response to treatment, examination of patient, obtaining history from patient or surrogate, ordering and performing treatments and interventions, ordering and review of laboratory studies, ordering and review of radiographic studies, pulse oximetry and re-evaluation of patient's condition.   Lanier Clam, MD [facility] See Amion for contact info

## 2021-01-25 DIAGNOSIS — Z9911 Dependence on respirator [ventilator] status: Secondary | ICD-10-CM | POA: Diagnosis not present

## 2021-01-25 DIAGNOSIS — G40909 Epilepsy, unspecified, not intractable, without status epilepticus: Secondary | ICD-10-CM | POA: Diagnosis not present

## 2021-01-25 DIAGNOSIS — R5381 Other malaise: Secondary | ICD-10-CM

## 2021-01-25 DIAGNOSIS — R64 Cachexia: Secondary | ICD-10-CM

## 2021-01-25 DIAGNOSIS — J9601 Acute respiratory failure with hypoxia: Secondary | ICD-10-CM | POA: Diagnosis not present

## 2021-01-25 LAB — GLUCOSE, CAPILLARY
Glucose-Capillary: 111 mg/dL — ABNORMAL HIGH (ref 70–99)
Glucose-Capillary: 116 mg/dL — ABNORMAL HIGH (ref 70–99)
Glucose-Capillary: 137 mg/dL — ABNORMAL HIGH (ref 70–99)
Glucose-Capillary: 160 mg/dL — ABNORMAL HIGH (ref 70–99)
Glucose-Capillary: 175 mg/dL — ABNORMAL HIGH (ref 70–99)
Glucose-Capillary: 88 mg/dL (ref 70–99)
Glucose-Capillary: 91 mg/dL (ref 70–99)

## 2021-01-25 MED ORDER — LEVETIRACETAM 100 MG/ML PO SOLN
1000.0000 mg | Freq: Two times a day (BID) | ORAL | Status: DC
  Administered 2021-01-25 – 2021-01-26 (×3): 1000 mg
  Filled 2021-01-25 (×3): qty 10

## 2021-01-25 MED ORDER — INSULIN DETEMIR 100 UNIT/ML ~~LOC~~ SOLN
10.0000 [IU] | Freq: Two times a day (BID) | SUBCUTANEOUS | Status: DC
  Administered 2021-01-25 – 2021-01-26 (×4): 10 [IU] via SUBCUTANEOUS
  Filled 2021-01-25 (×6): qty 0.1

## 2021-01-25 MED ORDER — PHENOBARBITAL SODIUM 65 MG/ML IJ SOLN
65.0000 mg | Freq: Every day | INTRAMUSCULAR | Status: DC
  Administered 2021-01-26: 65 mg via INTRAVENOUS
  Filled 2021-01-25: qty 1

## 2021-01-25 NOTE — Progress Notes (Signed)
Physical Therapy Treatment Patient Details Name: Samantha Atkins MRN: [id number] DOB: 1951-04-18 Today's Date: 01/25/2021    History of Present Illness Pt is 70 yo female admitted 6/6 after witnessed tonic clonic seizure at home and with EMS. These were preceded by nausea and vomiting. CT of head neg for acute events. Pt intubated 6/8, ongoing seizures noted through 6/10, improved with medical management. PMH: anxiety, depression, seizures, ovarian cancer.    PT Comments    The pt was seen for continued attempts to progress bed mobility and exercises this session. However, the pt presents with significantly increased lethargy and no active command following at this time. Unlike prior session, the pt was unable to manage even small amounts of movement or control of head and neck and therefore required total support at all times to maintain safe head/neck positioning. The pt demos no active command following this session despite opening eyes to all auditory stimuli. The pt was unable to blink consistently to command or answer any questions with any form of communication. Will continue to progress with trial of therapies, but the pt would need significant and longstanding therapy at d/c given current abilities.     Follow Up Recommendations  SNF (vs. LTACH pending pt respiratory status)     Equipment Recommendations   (defer to post acute)    Recommendations for Other Services       Precautions / Restrictions Precautions Precautions: Fall Precaution Comments: vent, flexiseal, NG tube, seizures Required Braces or Orthoses: Other Brace Other Brace: bilateral PRAFOs Restrictions Weight Bearing Restrictions: No    Mobility  Bed Mobility Overal bed mobility: Needs Assistance Bed Mobility: Supine to Sit     Supine to sit: Total assist;HOB elevated     General bed mobility comments: totaA  with support of bed to come to sit, but unable to maintain head position at this time without totalA  to avoid head falling    Transfers                 General transfer comment: deferred for pt safety      Balance Overall balance assessment: Needs assistance;History of Falls   Sitting balance-Leahy Scale: Zero Sitting balance - Comments: totalA to maintain upright                                    Cognition Arousal/Alertness: Lethargic;Suspect due to medications (RN stated she gave seizure medicine ~1 hr prior to session) Behavior During Therapy: Flat affect Overall Cognitive Status: Difficult to assess Area of Impairment: Following commands;Orientation                 Orientation Level: Disoriented to;Place;Situation     Following Commands: Follows one step commands inconsistently       General Comments: pt with significant lethargy today, she was able to open eyes to auditory stimuli repeatedly and raised eyebrows as a reaction to comments, however, pt unable to follow any commands related to movement such as "blink" or "blink 3 times" this session. The pt did not seem to be using 1 blink for yes and 2 blinks for no system during this session, gave no consistent attempts to respond. No active command following noted with movement of head at this time      Exercises General Exercises - Lower Extremity Ankle Circles/Pumps: PROM;Both;10 reps;Supine Heel Slides: PROM;Both;10 reps;Supine Hip ABduction/ADduction: PROM;Both;10 reps;Supine    General Comments General  comments (skin integrity, edema, etc.): VSS with progression to sitting via bed egress to chair      Pertinent Vitals/Pain Pain Assessment: Faces Faces Pain Scale: No hurt Pain Location: no response even to noxious stimuli Pain Intervention(s): Monitored during session     PT Goals (current goals can now be found in the care plan section) Acute Rehab PT Goals Patient Stated Goal: none stated, spouse would like for pt to get stronger PT Goal Formulation: With patient Time For  Goal Achievement: 02/06/21 Potential to Achieve Goals: Fair Progress towards PT goals: Not progressing toward goals - comment (lethargy)    Frequency    Min 2X/week      PT Plan Current plan remains appropriate    Co-evaluation PT/OT/SLP Co-Evaluation/Treatment: Yes Reason for Co-Treatment: Complexity of the patient's impairments (multi-system involvement);Necessary to address cognition/behavior during functional activity;For patient/therapist safety PT goals addressed during session: Mobility/safety with mobility;Strengthening/ROM        AM-PAC PT "6 Clicks" Mobility   Outcome Measure  Help needed turning from your back to your side while in a flat bed without using bedrails?: Total Help needed moving from lying on your back to sitting on the side of a flat bed without using bedrails?: Total Help needed moving to and from a bed to a chair (including a wheelchair)?: Total Help needed standing up from a chair using your arms (e.g., wheelchair or bedside chair)?: Total Help needed to walk in hospital room?: Total Help needed climbing 3-5 steps with a railing? : Total 6 Click Score: 6    End of Session Equipment Utilized During Treatment: Oxygen (vent, FiO2 40% PEEP 5) Activity Tolerance: Patient limited by fatigue;Patient limited by lethargy Patient left: in bed;with call bell/phone within reach;with bed alarm set;with SCD's reapplied Nurse Communication: Mobility status PT Visit Diagnosis: Muscle weakness (generalized) (M62.81)     Time: 0347-4259 PT Time Calculation (min) (ACUTE ONLY): 23 min  Charges:  $Therapeutic Exercise: 8-22 mins                     Karma Ganja, PT, DPT   Acute Rehabilitation Department Pager #: [phone]   Otho Bellows 01/25/2021, 5:55 PM

## 2021-01-25 NOTE — Progress Notes (Signed)
NAME:  Samantha Atkins, MRN:  [id number], DOB:  1951-02-20, LOS: 16 ADMISSION DATE:  01/11/2021, CONSULTATION DATE:  01/24/2021 REFERRING MD:  Tamala Julian, CHIEF COMPLAINT:  Recurrent Seizures   History of Present Illness:  Samantha Atkins is a 70 y.o. female with medical history significant for  seizures, anxiety, depression, and ovarian cancer who presented for seizure.  History is obtained from the patient's husband is present at bedside.  Apparently around 7 PM 6/6 patient began to have several episodes of nausea and vomiting throughout the night.  She last threw up around 5 AM.  Her husband had went to go get some water and other things and heard patient knocking over things on the nightstand.  When he went back in the room patient was having a full blown generalized tonic-clonic seizure for which she says that she was foaming at the mouth and not responding.  EMS was called and was witnessed having a 2-minute seizure with them as well and given Versed.  Patient husband notes that she has been having the intermittent episodes that last several minutes with difficulty following commands, changes in speech, weakness, and personality changes over the last month.  Previously had been seizure-free since 2019 prior to seizures restarting on 12/15/2020.  She had been followed by Dr. Delice Lesch who had set her up with 48-hour EEG monitoring and wanted to obtain a MRI of the brain with and without contrast and her Keppra has been increased to 500 mg twice daily recently on 5/20.  ED Course: Upon admission to the emergency department patient was seen as a code stroke.  CT scan of the head without contrast noted no acute abnormalities.  Neurology evaluated the patient and suspected symptoms were related to seizure and findings.  Afebrile,.  Influenza and COVID-19 screening were negative.  CT angiogram of the head and neck with perfusion did not note any emergent large vessel occlusion, but did note ischemic CT perfusion values in the  posterior right hemisphere no correlated arterial stenosis or occlusion.  Patient was given Keppra 1500 mg IV and continued on home regimen dose of Keppra and phenobarbital.  TRH called to admit.originally, however EEG revealed active seizures and focal seizures  that did not resolve with Keppra x 2, Ativan 3 mg Phenytoin, and Phenobarbital. Pt is continuing to have L sided seizures.   Significant Hospital Events: Including procedures, antibiotic start and stop dates in addition to other pertinent events   01/20/2021 Admission to [facility]  01/17/2021 status epilepticus 6/8 ETT 6/9 had hyperkalemia, LP performed shows no infection 6/10 ongoing seizures noted on EEG Suspected Ab mediated encephalitis - work up thus far unremarkable.  CT Chest/A/P negative for occult malignancy.  6/11 seizures improved with solumedrol 6/13 commenced sedation weaning.  6/14 patient started to open eyes to commands 6/15 Sedative infusions were weaned off  6/19 Follows commands, tries to move R arm, does not move other extremities 6/20 some head movement, blinks on command but not able to move otherwise  Interim History / Subjective:  Tmax 101.5. She denies pain.   Objective   Blood pressure 123/73, pulse (!) 110, temperature 98.5 F (36.9 C), temperature source Axillary, resp. rate (!) 26, height 5\' 3"  (1.6 m), weight 39.5 kg, SpO2 96 %.    Vent Mode: PRVC FiO2 (%):  [30 %-40 %] 30 % Set Rate:  [15 bmp] 15 bmp Vt Set:  [420 mL] 420 mL PEEP:  [5 cmH20] 5 cmH20 Pressure Support:  [10 cmH20] 10  cmH20 Plateau Pressure:  [14 cmH20-15 cmH20] 14 cmH20   Intake/Output Summary (Last 24 hours) at 01/25/2021 3419 Last data filed at 01/25/2021 0800 Gross per 24 hour  Intake 1550 ml  Output 900 ml  Net 650 ml    Filed Weights   01/22/21 0500 01/23/21 0400 01/24/21 0500  Weight: 44 kg 39.2 kg 39.5 kg    Examination: General: chronically ill appearing, frail elderly woman intubated, not sedated HENT: [place]/AT, eyes  anicteric, ETT in place. Lungs: breathing comfortably on MV, instantly has uncontrolled tachypnea when PS 10, but resolves with increased support to 20. CTAB. Cardiovascular: S1S2, RRR Abdomen: Thin, soft, NT Extremities: no cyanosis or edema, feet in contracture boots,  Neuro: blinks eyes and closes on command, able to weakly nod head for y/n questions but not able to open mouth, protrude tongue, move arms or legs. Skin: pallor, no rashes.  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 No significant improvement in movement over the past few days other than being able to turn her head. Following less commands today than yesterday compared to husband. -con't AEDs meds per neurology recommendations -Complicated high-dose steroid therapy for 5 days. Continue prednisone taper. -received IVIG - 6/15- 6/19 -Follow up on [facility] encephalitis /paraneoplastic Ab panel- sent 6/9.  Chronic HFpEF -close monitoring of volume status; goal euvolemia  Cachexia -con't TF  Acute respiratory failure -intubated for airway protection but now has flaccid and weak extremities -not able to tolerate PS/CPAP trial today. -LTVV, 4-8cc/kg IBW with goal Pplat<30 and DP<15 -VAP prevention protocol -discussed GoC-- I think she is not showing signs of rapid recovery on baseline weakness and is unlikely to be weaned from MV within the coming days.  Steroid induced hyperglycemia; now euglycemic -Continue feeding at goal.  -decreased levemir to 10 units BID -con't SSI PRN -goal BG 140-180  Goals of care : -According  to husband, poor pre-morbid quality of life, thus limited trial of aggressive treatment -no trach or PEG.  He acknowledges that we have already gone beyond what she would have wished for.  However given improvement on 6/17, it seems reasonable to give her some more time.  He agrees to one-way extubation when medically optimized.    6/20-- discussed her care with her husband and sister in law at bedside with RN present. All of their questions were answered. My concern is her very limited pre-morbid functional status and her expressed wishes to not want to live any more debilitated than she previously was. Her trajectory of improvement is very slow over the past few days despite being totally awake and appearing to have intact comprehension. I think that she is likely weeks to months away from significant recovery to the point that she could move herself around in bed or have any significant amount of strength and coordination. I think it is unlikely she could walk again. Her osteoporosis has likely worsened due to demineralization. Her weakness would likely require 24/7 nursing care, and she has expressed not wanting to live in a facility. Her husband is concerned that she would not find her expected QOL acceptable. We discussed liberalizing visitation and tentatively planning on terminal  extubation Wednesday, 6/22 if she has not shown signs of significant improvement before then. In the interim, if she declines, we are not escalating her care any.  Best practice (right click and "Reselect all SmartList Selections" daily)  Diet:  NPO tube feeds Pain/Anxiety/Delirium protocol (if indicated): Fentanyl as needed VAP protocol (if indicated): Yes DVT prophylaxis: Subcutaneous Heparin GI prophylaxis: PPI Glucose control:  SSI Yes and Basal insulin Yes  Central venous access: N/A Arterial line:  N/A Foley:  N/A  Mobility:  bed rest  PT consulted: N/A Last date of multidisciplinary goals of care discussion:  6/20. DNR, likely withdrawing on 6/22 Code Status:  DNR Disposition: ICU  This patient is critically ill with multiple organ system failure which requires frequent high complexity decision making, assessment, support, evaluation, and titration of therapies. This was completed through the application of advanced monitoring technologies and extensive interpretation of multiple databases. During this encounter critical care time was devoted to patient care services described in this note for 65 minutes.  Julian Hy, DO 01/25/21 9:56 AM [facility]

## 2021-01-25 NOTE — Evaluation (Signed)
Occupational Therapy Evaluation Patient Details Name: Samantha Atkins MRN: [id number] DOB: 1950/11/29 Today's Date: 01/25/2021    History of Present Illness Pt is 70 yo female admitted 6/6 after witnessed tonic clonic seizure at home and with EMS. These were preceded by nausea and vomiting. CT of head neg for acute events. Pt intubated 6/8, ongoing seizures noted through 6/10, improved with medical management. PMH: anxiety, depression, seizures, ovarian cancer.   Clinical Impression   Pt very limited this evaluation with no AROM, no initiation of movement and no commands followed. Pt not blinking this session; slightly raising eyebrows and opening eyes to name. Pt repositioned in bed and performed cervical and BUE exercises with totalA. Pt totalA for ADL. Pt not responding to painful stimuli at this time. Pt could benefit from continued OT  for positioning options and until family has reached a decision for pt's care. OT following acutely.    Follow Up Recommendations  SNF;Other (comment);LTACH (palliative options)    Equipment Recommendations  Hospital bed    Recommendations for Other Services       Precautions / Restrictions Precautions Precautions: Fall Precaution Comments: vent, flexiseal, NG tube, seizures Required Braces or Orthoses: Other Brace Other Brace: bilateral PRAFOs Restrictions Weight Bearing Restrictions: No      Mobility Bed Mobility Overal bed mobility: Needs Assistance Bed Mobility: Supine to Sit     Supine to sit: Total assist;HOB elevated     General bed mobility comments: totaA  with support of bed to come to sit, but unable to maintain head position at this time without totalA to avoid head falling    Transfers                 General transfer comment: deferred for pt safety    Balance Overall balance assessment: Needs assistance;History of Falls   Sitting balance-Leahy Scale: Zero Sitting balance - Comments: totalA to maintain upright                                    ADL either performed or assessed with clinical judgement   ADL                                         General ADL Comments: totalA for all ADL tasks at this time     Vision Baseline Vision/History: No visual deficits Patient Visual Report: No change from baseline Vision Assessment?: No apparent visual deficits     Perception     Praxis      Pertinent Vitals/Pain Pain Assessment: Faces Faces Pain Scale: No hurt Pain Location: no response even to noxious stimuli Pain Intervention(s): Monitored during session     Hand Dominance Right   Extremity/Trunk Assessment Upper Extremity Assessment Upper Extremity Assessment: RUE deficits/detail;LUE deficits/detail RUE Deficits / Details: 0/5 strength, no initiation; no command follow RUE Sensation: decreased light touch RUE Coordination: decreased fine motor;decreased gross motor LUE Deficits / Details: 0/5 strength, no initiation; no command follow LUE Sensation: decreased light touch LUE Coordination: decreased fine motor;decreased gross motor   Lower Extremity Assessment Lower Extremity Assessment: RLE deficits/detail;LLE deficits/detail RLE Deficits / Details: no AROM LLE Deficits / Details: no AROM   Cervical / Trunk Assessment Cervical / Trunk Assessment: Other exceptions Cervical / Trunk Exceptions: head unable to stabilize without totalA; no trunk stability  Communication Communication Communication: Other (comment) (intubated)   Cognition Arousal/Alertness: Lethargic;Suspect due to medications (RN stated she gave seizure medicine ~1 hr prior to session) Behavior During Therapy: Flat affect Overall Cognitive Status: Difficult to assess Area of Impairment: Following commands;Orientation                 Orientation Level: Disoriented to;Place;Situation     Following Commands: Follows one step commands inconsistently       General Comments:  pt with significant lethargy today, she was able to open eyes to auditory stimuli repeatedly and raised eyebrows as a reaction to comments, however, pt unable to follow any commands related to movement such as "blink" system. No active command following noted with movement of head at this time   General Comments  VSS with progression to sitting via bed egress to chair    Exercises Exercises: General Lower Extremity General Exercises - Upper Extremity Shoulder Flexion: PROM;Both;5 reps;Seated Shoulder ABduction: PROM;Both;5 reps;Supine Elbow Flexion: PROM;Both;10 reps;Supine Elbow Extension: PROM;Both;10 reps;Supine Wrist Flexion: PROM;Both;10 reps;Supine Wrist Extension: PROM;Both;10 reps;Supine General Exercises - Lower Extremity Ankle Circles/Pumps: PROM;Both;10 reps;Supine Heel Slides: PROM;Both;10 reps;Supine Hip ABduction/ADduction: PROM;Both;10 reps;Supine   Shoulder Instructions      Home Living Family/patient expects to be discharged to:: Unsure                                        Prior Functioning/Environment Level of Independence: Needs assistance  Gait / Transfers Assistance Needed: walking with RW 50% of time, 50% without AD, but husband reports ~1 fall per week ADL's / Homemaking Assistance Needed: last month needed assist with showering and dressing.            OT Problem List: Decreased strength;Decreased range of motion;Decreased activity tolerance;Impaired balance (sitting and/or standing);Impaired vision/perception;Decreased coordination;Decreased cognition;Decreased safety awareness;Cardiopulmonary status limiting activity;Pain;Impaired UE functional use      OT Treatment/Interventions: Self-care/ADL training;Therapeutic exercise;Neuromuscular education;Energy conservation;Therapeutic activities;Cognitive remediation/compensation;Visual/perceptual remediation/compensation;Patient/family education    OT Goals(Current goals can be found in  the care plan section) Acute Rehab OT Goals Patient Stated Goal: none stated, spouse would like for pt to get stronger OT Goal Formulation: Patient unable to participate in goal setting Time For Goal Achievement: 02/08/21 Potential to Achieve Goals: Poor ADL Goals Additional ADL Goal #1: Pt will participate in 3 mins of functional tasks with rest breaks and O2 >90%. Additional ADL Goal #2: Pt will tolerate PROM to BUEs for positioning and education for caregivers to reduce stiffness and pressure sores.  OT Frequency: Min 1X/week   Barriers to D/C:            Co-evaluation PT/OT/SLP Co-Evaluation/Treatment: Yes Reason for Co-Treatment: Necessary to address cognition/behavior during functional activity;Complexity of the patient's impairments (multi-system involvement) PT goals addressed during session: Mobility/safety with mobility;Strengthening/ROM OT goals addressed during session: Strengthening/ROM      AM-PAC OT "6 Clicks" Daily Activity     Outcome Measure Help from another person eating meals?: Total Help from another person taking care of personal grooming?: Total Help from another person toileting, which includes using toliet, bedpan, or urinal?: Total Help from another person bathing (including washing, rinsing, drying)?: Total Help from another person to put on and taking off regular upper body clothing?: Total Help from another person to put on and taking off regular lower body clothing?: Total 6 Click Score: 6   End of Session Equipment Utilized During Treatment:  Oxygen Nurse Communication: Mobility status;Other (comment) (need for palliative option; no advances in progression with SN and therapy at this time)  Activity Tolerance: No increased pain Patient left: in bed;with call bell/phone within reach;with bed alarm set  OT Visit Diagnosis: Unsteadiness on feet (R26.81);Muscle weakness (generalized) (M62.81);Other symptoms and signs involving cognitive  function;Pain Pain - part of body:  (generalized)                Time: 1235-1250 OT Time Calculation (min): 15 min Charges:  OT General Charges $OT Visit: 1 Visit OT Evaluation $OT Eval Moderate Complexity: 1 Mod  Jefferey Pica, OTR/L Acute Rehabilitation Services Pager: [phone] Office: [phone]   [id number] C 01/25/2021, 8:28 PM

## 2021-01-25 NOTE — Progress Notes (Signed)
Subjective: No clinical seizures overnight.  Husband and his sister at bedside.   ROS: Unable to obtain due to poor mental status  Examination  Vital signs in last 24 hours: Temp:  [97.8 F (36.6 C)-101.5 F (38.6 C)] 100.2 F (37.9 C) (06/20 0800) Pulse Rate:  [86-111] 99 (06/20 1100) Resp:  [16-36] 19 (06/20 1100) BP: (96-139)/(56-80) 104/62 (06/20 1100) SpO2:  [95 %-100 %] 100 % (06/20 1100) FiO2 (%):  [30 %-40 %] 30 % (06/20 0319)  General: lying in bed, not in apparent distress CVS: pulse-normal rate and rhythm RS: Intubated, CTAB Extremities: normal, warm Neuro: Asleep, opens eyes to repeated tactile stimulation but does not follow commands, PERRLA, no forced gaze deviation, gag reflex intact, does not withdraw to noxious stimuli in all 4 extremities      Basic Metabolic Panel: Recent Labs  Lab 01/19/21 0429 01/20/21 0530 01/22/21 0759 01/23/21 0447 01/24/21 0856  NA 134* 137 136 136 136  K 3.4* 3.4* 3.6 4.0 3.6  CL 99 99 100 100 101  CO2 26 27 28 27 26   GLUCOSE 250* 97 138* 108* 146*  BUN 19 25* 20 20 24*  CREATININE 0.39* 0.37* 0.39* 0.40* 0.44  CALCIUM 7.8* 7.9* 8.3* 7.9* 8.2*  PHOS 1.7* 2.0*  --   --   --     CBC: Recent Labs  Lab 01/19/21 0429 01/20/21 0530 01/22/21 0759 01/23/21 0447  WBC 14.0* 13.4* 11.3* 13.3*  NEUTROABS  --   --  9.3*  --   HGB 11.6* 11.7* 10.4* 10.3*  HCT 34.2* 34.5* 31.1* 30.5*  MCV 102.1* 101.8* 103.7* 102.0*  PLT 289 282 184 215     Coagulation Studies: No results for input(s): LABPROT, INR in the last 72 hours.  Imaging No new brain imaging overnight  ASSESSMENT AND PLAN: 70 year old female who presented with focal convulsive status epilepticus which was refractory to Versed, ketamine and multiple AEDs.  Lumbar puncture was performed on 01/14/2021, no CSF pleocytosis was seen, CSF and serum was sent to [facility] for autoimmune and paraneoplastic work-up and patient was started on IV Solu-Medrol 1000 mg after which  EEG showed significant improvement.    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   -Patient has been intubated on high-dose Versed and ketamine followed by steroids for over a week likely contributing to myopathy   Recommendations -Reduce Keppra to 1000 mg twice daily and phenobarbital to 65 mg nightly to minimize sedation -Continue Vimpat 100 mg twice daily, perampanel 8 mg nightly, pregabalin 100mg  BID and Dilantin to 180 mg twice daily  -Completed Solu-Medrol 1000 mg for 5 days on 01/18/2021, now on prednisone taper. 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  Per husband, plan is to wait till Wednesday and then maybe consider one-way extubation.   I have spent a total of  25 minutes with the patient reviewing hospital notes,  test results, labs and examining the patient as well as establishing an assessment and plan.  > 50% of time was spent in direct patient care.    Zeb Comfort Epilepsy [facility] For questions after 5pm please refer to AMION to reach the Neurologist on call

## 2021-01-26 DIAGNOSIS — J9601 Acute respiratory failure with hypoxia: Secondary | ICD-10-CM | POA: Diagnosis not present

## 2021-01-26 DIAGNOSIS — I5042 Chronic combined systolic (congestive) and diastolic (congestive) heart failure: Secondary | ICD-10-CM | POA: Diagnosis not present

## 2021-01-26 DIAGNOSIS — G40909 Epilepsy, unspecified, not intractable, without status epilepticus: Secondary | ICD-10-CM | POA: Diagnosis not present

## 2021-01-26 DIAGNOSIS — Z9911 Dependence on respirator [ventilator] status: Secondary | ICD-10-CM | POA: Diagnosis not present

## 2021-01-26 LAB — GLUCOSE, CAPILLARY
Glucose-Capillary: 126 mg/dL — ABNORMAL HIGH (ref 70–99)
Glucose-Capillary: 131 mg/dL — ABNORMAL HIGH (ref 70–99)
Glucose-Capillary: 125 mg/dL — ABNORMAL HIGH (ref 70–99)
Glucose-Capillary: 100 mg/dL — ABNORMAL HIGH (ref 70–99)
Glucose-Capillary: 104 mg/dL — ABNORMAL HIGH (ref 70–99)
Glucose-Capillary: 170 mg/dL — ABNORMAL HIGH (ref 70–99)

## 2021-01-26 LAB — BASIC METABOLIC PANEL
Anion gap: 9 (ref 5–15)
BUN: 44 mg/dL — ABNORMAL HIGH (ref 8–23)
CO2: 28 mmol/L (ref 22–32)
Calcium: 8.8 mg/dL — ABNORMAL LOW (ref 8.9–10.3)
Chloride: 107 mmol/L (ref 98–111)
Creatinine, Ser: 0.55 mg/dL (ref 0.44–1.00)
GFR, Estimated: >60 mL/min (ref >60)
Glucose, Bld: 119 mg/dL — ABNORMAL HIGH (ref 70–99)
Potassium: 3.6 mmol/L (ref 3.5–5.1)
Sodium: 144 mmol/L (ref 135–145)

## 2021-01-26 LAB — CBC
HCT: 28.8 % — ABNORMAL LOW (ref 36.0–46.0)
Hemoglobin: 9.2 g/dL — ABNORMAL LOW (ref 12.0–15.0)
MCH: 33.9 pg (ref 26.0–34.0)
MCHC: 31.9 g/dL (ref 30.0–36.0)
MCV: 106.3 fL — ABNORMAL HIGH (ref 80.0–100.0)
Platelets: 316 10*3/uL (ref 150–400)
RBC: 2.71 MIL/uL — ABNORMAL LOW (ref 3.87–5.11)
RDW: 13.7 % (ref 11.5–15.5)
WBC: 15 10*3/uL — ABNORMAL HIGH (ref 4.0–10.5)
nRBC: 0 % (ref 0.0–0.2)

## 2021-01-26 LAB — VITAMIN B6: Vitamin B6: 8.6 ug/L (ref 3.4–65.2)

## 2021-01-26 MED ORDER — FENTANYL CITRATE (PF) 100 MCG/2ML IJ SOLN
25.0000 ug | INTRAMUSCULAR | Status: DC | PRN
  Administered 2021-01-26: 25 ug via INTRAVENOUS
  Filled 2021-01-26: qty 2

## 2021-01-26 MED ORDER — LEVALBUTEROL HCL 0.63 MG/3ML IN NEBU: 0.6300 mg | INHALATION_SOLUTION | Freq: Four times a day (QID) | RESPIRATORY_TRACT | Status: DC | PRN

## 2021-01-26 MED ORDER — POTASSIUM CHLORIDE 20 MEQ PO PACK
40.0000 meq | PACK | Freq: Once | ORAL | Status: AC
  Administered 2021-01-26: 40 meq
  Filled 2021-01-26: qty 2

## 2021-01-26 NOTE — Progress Notes (Signed)
Nutrition Follow-up  DOCUMENTATION CODES:   Underweight, Severe malnutrition in context of chronic illness  INTERVENTION:   Tube Feeding via Cortrak: Vital AF 1.2 at 50 ml/hr Provides 1440 kcals, 90 g of protein, 937 mL of water   MVI with Minerals daily   Thiamine 100 mg daily   NUTRITION DIAGNOSIS:   Severe Malnutrition related to chronic illness (anxiety, depression) as evidenced by severe muscle depletion, severe fat depletion. Ongoing   GOAL:   Patient will meet greater than or equal to 90% of their needs Met with TF   MONITOR:   Vent status, Labs, Weight trends, TF tolerance, I & O's  REASON FOR ASSESSMENT:   Ventilator, Consult Enteral/tube feeding initiation and management, Assessment of nutrition requirement/status  ASSESSMENT:   70 year old female who presented to the ED on 6/07 with seizures and stroke-like symptoms. PMH of seizures, GERD, anxiety, depression, insomnia, migraines, and ovarian cancer. Pt admitted with recurrent seizures with Todd's paralysis.  Pt discussed during ICU rounds and with RN.  Per RN/MD notes plan for one-way extubation 6/22. No trach or PEG planned. Pt examined in room, mom at bedside.  Noted B6 lab returned, low normal (pt received 6 days of full nutrition support and MVI with minerals daily prior to vitamin lab draws)    Patient is currently intubated on ventilator support MV: 12.2 L/min Temp (24hrs), Avg:99.1 F (37.3 C), Min:97.9 F (36.6 C), Max:100.6 F (38.1 C)   Medications reviewed and include: SSI, Levemir, MVI with minerals, phenobarbital, miralax, prednisone, B6 50 mg TID, thiamine  Labs reviewed: Vitamin B6: 8.6   Diet Order:   Diet Order             Diet NPO time specified  Diet effective now                   EDUCATION NEEDS:   No education needs have been identified at this time  Skin:  Skin Assessment: Skin Integrity Issues: Skin Integrity Issues:: DTI DTI: sacrum  Last BM:  6/20 x 1  type 6 documented (no volume documented)  Height:   Ht Readings from Last 1 Encounters:  01/21/21 5' 3"  (1.6 m)    Weight:   Wt Readings from Last 1 Encounters:  01/24/21 39.5 kg    Ideal Body Weight:     BMI:  Body mass index is 15.43 kg/m.  Estimated Nutritional Needs:   Kcal:  1300-1500  Protein:  65-85 grams  Fluid:  >/= 1.5 L/day  Lockie Pares., RD, LDN, CNSC See AMiON for contact information

## 2021-01-26 NOTE — Progress Notes (Signed)
K 3.6 Electrolytes replaced per [facility] electrolyte replacement protocol

## 2021-01-26 NOTE — Progress Notes (Signed)
NAME:  Samantha Atkins, MRN:  [id number], DOB:  30-Nov-1950, LOS: 40 ADMISSION DATE:  01/06/2021, CONSULTATION DATE:  01/22/2021 REFERRING MD:  Tamala Julian, CHIEF COMPLAINT:  Recurrent Seizures   History of Present Illness:  Samantha Atkins is a 70 y.o. female with medical history significant for  seizures, anxiety, depression, and ovarian cancer who presented for seizure.  History is obtained from the patient's husband is present at bedside.  Apparently around 7 PM 6/6 patient began to have several episodes of nausea and vomiting throughout the night.  She last threw up around 5 AM.  Her husband had went to go get some water and other things and heard patient knocking over things on the nightstand.  When he went back in the room patient was having a full blown generalized tonic-clonic seizure for which she says that she was foaming at the mouth and not responding.  EMS was called and was witnessed having a 2-minute seizure with them as well and given Versed.  Patient husband notes that she has been having the intermittent episodes that last several minutes with difficulty following commands, changes in speech, weakness, and personality changes over the last month.  Previously had been seizure-free since 2019 prior to seizures restarting on 12/15/2020.  She had been followed by Dr. Delice Lesch who had set her up with 48-hour EEG monitoring and wanted to obtain a MRI of the brain with and without contrast and her Keppra has been increased to 500 mg twice daily recently on 5/20.  ED Course: Upon admission to the emergency department patient was seen as a code stroke.  CT scan of the head without contrast noted no acute abnormalities.  Neurology evaluated the patient and suspected symptoms were related to seizure and findings.  Afebrile,.  Influenza and COVID-19 screening were negative.  CT angiogram of the head and neck with perfusion did not note any emergent large vessel occlusion, but did note ischemic CT perfusion values in the  posterior right hemisphere no correlated arterial stenosis or occlusion.  Patient was given Keppra 1500 mg IV and continued on home regimen dose of Keppra and phenobarbital.  TRH called to admit.originally, however EEG revealed active seizures and focal seizures  that did not resolve with Keppra x 2, Ativan 3 mg Phenytoin, and Phenobarbital. Pt is continuing to have L sided seizures.   Significant Hospital Events: Including procedures, antibiotic start and stop dates in addition to other pertinent events   01/10/2021 Admission to [facility]  01/07/2021 status epilepticus 6/8 ETT 6/9 had hyperkalemia, LP performed shows no infection 6/10 ongoing seizures noted on EEG Suspected Ab mediated encephalitis - work up thus far unremarkable.  CT Chest/A/P negative for occult malignancy.  6/11 seizures improved with solumedrol 6/13 commenced sedation weaning.  6/14 patient started to open eyes to commands 6/15 Sedative infusions were weaned off  6/19 Follows commands, tries to move R arm, does not move other extremities 6/20 some head movement, blinks on command but not able to move otherwise  Interim History / Subjective:  She denies complaints.  Objective   Blood pressure 105/63, pulse 89, temperature 98.3 F (36.8 C), temperature source Axillary, resp. rate 20, height 5\' 3"  (1.6 m), weight 39.5 kg, SpO2 100 %.    Vent Mode: PRVC FiO2 (%):  [30 %] 30 % Set Rate:  [15 bmp] 15 bmp Vt Set:  [420 mL] 420 mL PEEP:  [5 cmH20] 5 cmH20 Plateau Pressure:  [14 cmH20-18 cmH20] 17 cmH20   Intake/Output Summary (  Last 24 hours) at 01/26/2021 0716 Last data filed at 01/26/2021 0600 Gross per 24 hour  Intake 1462.13 ml  Output 600 ml  Net 862.13 ml    Filed Weights   01/22/21 0500 01/23/21 0400 01/24/21 0500  Weight: 44 kg 39.2 kg 39.5 kg    Examination: General: chronially ill, frail-appearing and cachectic woman lying in bed in NAD HENT: [place]/AT, eyes anicteric Lungs: breathing comfortably on PS 8+ CPAP  5, Vt ~350cc with RR around 30. CTAB Cardiovascular: S1S2, RRR Abdomen: thin, soft, hypoactive bowel sounds Extremities: feet in contracture boots, no peripheral edema Neuro: Tracks, nods weakly to answer y/n questions. Can move her head back and forth. Today able to weakly squeeze and attempt to lift left arm off the bed, 1/5 strength. 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 No significant improvement in movement over the past few days other than being able to turn her head. Following less commands today than yesterday compared to husband. -Con't AEDs recommended by Neuro -Complicated high-dose steroid therapy for 5 days. Continue prednisone taper. -received IVIG - 6/15- 6/19 -Follow up on [facility] encephalitis /paraneoplastic Ab panel- sent 6/9.   Chronic HFpEF -monitor volume status; euvolemic on exam  Cachexia, severe chronic deconditioning Severe protein energy malnutrition -con't TF  Acute respiratory failure -intubated for airway protection but now has flaccid and weak extremities -LTVV, 4-8cc/kg IBW with goal Pplat <30 and DP<15 -VAP prevention protocol -daily SAT & SBT; tolerating PS 8  today, which is a significant improvement since yesterday -Ongoing GoC discussions with family. Sister in law at bedside, husband coming later today.   Steroid induced hyperglycemia; now euglycemic -Continue feeding at goal.  -con't levemir 10 units BID -con't SSI PRN -goal BG 140-180  Goals of care : -According  to husband, poor pre-morbid quality of life, thus limited trial of aggressive treatment -no trach or PEG.  He acknowledges that we have already gone beyond what she would have wished for.  However given improvement on 6/17, it seems reasonable to give her some more time.  He agrees to one-way extubation when medically optimized.    6/20-- discussed her care with her husband and sister in law at bedside with RN present. All of their questions were answered. My concern is her very limited pre-morbid functional status and her expressed wishes to not want to live any more debilitated than she previously was. Her trajectory of improvement is very slow over the past few days despite being totally awake and appearing to have intact comprehension. I think that she is likely weeks to months away from significant recovery to the point that she could move herself around in bed or have any significant amount of strength and coordination. I think it is unlikely she could walk again. Her osteoporosis has likely worsened due to demineralization. Her weakness would likely require 24/7 nursing care, and she has expressed not wanting to live in a facility. Her husband is concerned that she would not find her expected QOL acceptable. We discussed liberalizing visitation and tentatively planning on terminal extubation Wednesday, 6/22  if she has not shown signs of significant improvement before then. In the interim, if she declines, we are not escalating her care any. 6/21: Sister in law at bedside, husband coming later today. We discussed that from a vent weaning standpoint she is showing signs of improvement compared to yesterday, and she is able to move her extremities more than yesterday, although she remains profoundly weak. We will see how she weans today and assess again tomorrow. We will discuss all possible scenarios prior to extubation to have a plan moving forward.  Best practice (right click and "Reselect all SmartList  Selections" daily)  Diet:  Tube Feed   Pain/Anxiety/Delirium protocol (if indicated): Fentanyl as needed VAP protocol (if indicated): Yes DVT prophylaxis: Subcutaneous Heparin GI prophylaxis: PPI Glucose control:  SSI Yes and Basal insulin Yes  Central venous access: N/A Arterial line:  N/A Foley:  N/A  Mobility:  bed rest  PT consulted: N/A Last date of multidisciplinary goals of care discussion: 6/20. DNR, likely withdrawing on 6/22 Code Status:  DNR Disposition: ICU  This patient is critically ill with multiple organ system failure which requires frequent high complexity decision making, assessment, support, evaluation, and titration of therapies. This was completed through the application of advanced monitoring technologies and extensive interpretation of multiple databases. During this encounter critical care time was devoted to patient care services described in this note for 46 minutes.  Julian Hy, DO 01/26/21 9:25 AM [facility]

## 2021-01-26 NOTE — TOC Initial Note (Signed)
Transition of Care [facility]) - Initial/Assessment Note    Patient Details  Name: Samantha Atkins MRN: [id number] Date of Birth: 07/27/1951  Transition of Care [facility]) CM/SW Contact:    Benard Halsted, LCSW Phone Number: 01/26/2021, 5:33 PM  Clinical Narrative:                 CSW received consult from RN with patient's sister-in-law requesting to speak to CSW. CSW met with her and patient's spouse, [name]. They requested information on all potential possibilities for patient, including funeral home information if patient passes away upon extubation. CSW does not have a list of funeral homes but advised them to read reviews and compare pricing. They stated that patient requested to be cremated. They asked about scenario where patient did not pass. CSW explained SNF versus home health options. Patient's spouse stated patient always said she would never want to spend even one hour in a nursing facility. He also requested resources for seniors in the area, specifically around transportation. CSW provided him with Senior Information packet. No further questions at this time.     Barriers to Discharge: Continued Medical Work up   Patient Goals and CMS Choice   CMS Medicare.gov Compare Post Acute Care list provided to:: Patient Represenative (must comment) Choice offered to / list presented to : Spouse (and SIL)  Expected Discharge Plan and Services   In-house Referral: Clinical Social Work     Living arrangements for the past 2 months: Single Family Home                                      Prior Living Arrangements/Services Living arrangements for the past 2 months: Single Family Home Lives with:: Spouse Patient language and need for interpreter reviewed:: Yes        Need for Family Participation in Patient Care: Yes (Comment) Care giver support system in place?: Yes (comment)   Criminal Activity/Legal Involvement Pertinent to Current Situation/Hospitalization: No - Comment as  needed  Activities of Daily Living   ADL Screening (condition at time of admission) Patient's cognitive ability adequate to safely complete daily activities?: No Is the patient deaf or have difficulty hearing?: No Does the patient have difficulty seeing, even when wearing glasses/contacts?: No Does the patient have difficulty concentrating, remembering, or making decisions?: Yes Patient able to express need for assistance with ADLs?: No Does the patient have difficulty dressing or bathing?: Yes Independently performs ADLs?: No Communication: Dependent Is this a change from baseline?: Change from baseline, expected to last >3 days Dressing (OT): Dependent Is this a change from baseline?: Change from baseline, expected to last >3 days Grooming: Dependent Is this a change from baseline?: Change from baseline, expected to last >3 days Feeding: Dependent Is this a change from baseline?: Change from baseline, expected to last >3 days Bathing: Dependent Is this a change from baseline?: Change from baseline, expected to last >3 days Toileting: Dependent Is this a change from baseline?: Change from baseline, expected to last >3days In/Out Bed: Dependent Is this a change from baseline?: Change from baseline, expected to last >3 days Walks in Home: Dependent Is this a change from baseline?: Change from baseline, expected to last >3 days Does the patient have difficulty walking or climbing stairs?: Yes Weakness of Legs: None Weakness of Arms/Hands: Left  Permission Sought/Granted      Share Information with NAME: [name]  Permission granted to share info w Relationship: Spouse  Permission granted to share info w Contact Information: [phone]  Emotional Assessment Appearance:: Appears stated age Attitude/Demeanor/Rapport: Unable to Assess Affect (typically observed): Unable to Assess Orientation: : Fluctuating Orientation (Suspected and/or reported Sundowners) Alcohol / Substance  Use: Not Applicable Psych Involvement: No (comment)  Admission diagnosis:  Seizure ([place]) [R56.9] Seizures ([place]) [R56.9] Patient Active Problem List   Diagnosis Date Noted   Pressure injury of skin 01/23/2021   Recurrent seizures ([place]) 01/31/2021   Hypokalemia 01/09/2021   Hypocalcemia 01/06/2021   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   Nausea and vomiting 01/19/2021   Encounter for central line placement    Encounter for intubation    Chronic bilateral pleural effusions    Acute combined systolic and diastolic CHF, NYHA class 1 (HCC)    Polysubstance abuse ([place])    Acute blood loss anemia    Seizures ([place]) 01/29/2018   Status epilepticus ([place])    Hypotension    Acute respiratory failure with hypoxia ([place])    Endotracheally intubated    Protein-calorie malnutrition, severe 02/15/2017   Hip fracture ([place]) 02/12/2017   Osteoporosis    Migraines    Depression    Fracture of humerus, proximal, left, closed 08/07/2015   PCP:  Kathyrn Lass, MD Pharmacy:   [id number] [place] [id number]: [phone]: [phone]    Social Determinants of Health (SDOH) Interventions    Readmission Risk Interventions No flowsheet data found.

## 2021-01-26 NOTE — Progress Notes (Signed)
eLink Physician-Brief Progress Note Patient Name: Shefali Ng DOB: 27-Mar-1951 MRN: [id number]   Date of Service  01/26/2021  HPI/Events of Note  Agitation - Patient awake. Nursing requesting PRN sedation. BP = 111/71.  eICU Interventions  Plan: Fentanyl 25-50 mcg IV Q 2 hours PRN sedation or pain.      Intervention Category Major Interventions: Delirium, psychosis, severe agitation - evaluation and management  Simmie Camerer [place] 01/26/2021, 8:14 PM

## 2021-01-27 LAB — GLUCOSE, CAPILLARY: Glucose-Capillary: 144 mg/dL — ABNORMAL HIGH (ref 70–99)

## 2021-01-27 MED ORDER — ALBUMIN HUMAN 25 % IV SOLN
12.5000 g | Freq: Once | INTRAVENOUS | Status: AC
  Administered 2021-01-27: 12.5 g via INTRAVENOUS
  Filled 2021-01-27: qty 50

## 2021-02-05 NOTE — Death Summary Note (Signed)
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[place])   Nausea and vomiting   Encounter for central line placement   Encounter for intubation   Pressure injury of skin Status epilepticus Acute encephalopathy 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  History is obtained from the patient's husband is present at bedside.  Apparently around 7 PM 6/6 patient began to have several episodes of nausea and vomiting throughout the night.  She last threw up around 5 AM.  Her husband had went to go get some water and other things and heard patient knocking over things on the nightstand.  When he went back in the room patient was having a full blown generalized tonic-clonic seizure for which she says that she was foaming at the mouth and not responding.  EMS was called and was witnessed having a 2-minute seizure with them as well  and given Versed.  Patient husband notes that she has been having the intermittent episodes that last several minutes with difficulty following commands, changes in speech, weakness, and personality changes over the last month.  Previously had been seizure-free since 2017-11-23 prior to seizures restarting on 12/15/2020.  She had been followed by Dr. Delice Lesch who had set her up with 48-hour EEG monitoring and wanted to obtain a MRI of the brain with and without contrast and her Keppra has been increased to 500 mg twice daily recently on 5/20.   ED Course: Upon admission to the emergency department patient was seen as a code stroke.  CT scan of the head without contrast noted no acute abnormalities.  Neurology evaluated the patient and suspected symptoms were related to seizure and findings.  Afebrile,.  Influenza and COVID-19 screening were negative.  CT angiogram of the head and neck with perfusion did not note any emergent large vessel occlusion, but did note ischemic CT perfusion values in the posterior right hemisphere no correlated arterial stenosis or occlusion.  Patient was given Keppra 1500 mg IV and continued on home regimen dose of Keppra and phenobarbital.  [facility] called to admit.originally, however EEG revealed active seizures and focal seizures  that did not resolve with Keppra x 2, Ativan 3 mg Phenytoin, and Phenobarbital. Pt is continuing to have L sided seizures.  She was managed during her hospitalization by Neurology and critical care. She remained on mechanical ventilation for 2 weeks due to difficulty weaning due to neuromuscular weakness. 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 LP had shown no signs of infection.  CT scans did not suggest an occult malignancy to explain a paraneoplastic syndrome. Although she eventually regained consciousness, she remained severely weak and was unable to be weaned from MV. Her  family felt that a prolonged course of critical illness and the low chance of being able to regain her previous functional status was inconsistent with her wishes. She was made DNR and family had planned a terminal extubation. She declined precipitously overnight before this could occur and she passed away at 06:14 on 01/31/21.   Pertinent Labs and Studies  Significant Diagnostic Studies CT HEAD WO CONTRAST  Result Date: 01/14/2021 CLINICAL DATA:  Subdural hemorrhage.  Seizure. EXAM: CT HEAD WITHOUT CONTRAST TECHNIQUE: Contiguous axial images were obtained from the base of the skull through the vertex without intravenous contrast. COMPARISON:  Brain MRI earlier same day FINDINGS: Brain: No abnormality is seen affecting the brainstem or cerebellum. No discernible subdural collection or any hyperdensity in the subdural space. Cerebral hemispheres show age related atrophy with chronic small-vessel ischemic changes of the white matter and perhaps very subtle at most low-density in the right thalamus consistent with the MRI findings. CT does not show any evidence subdural blood. There appear to be normal subarachnoid spaces by CT, but the MRI findings are convincing and worrisome as described. No hydrocephalus. Vascular: There is atherosclerotic calcification of the major vessels at the base of the brain. Skull: Negative Sinuses/Orbits: Clear/normal Other: None IMPRESSION: MRI findings are not appreciable by CT. There is atrophy and chronic small-vessel ischemic change of the brain. One could imagine that there is very minimal low-density of the right thalamus, correlated with the MRI findings of mild T2 signal in that region. The bifrontal and left posterior fossa subdural material is not visible by CT. Certainly there is no hyperdensity to suggest recent hemorrhage. As noted at MRI, empyema is not excluded and lumbar puncture should be considered. Electronically Signed   By: Nelson Chimes M.D.   On: 01/14/2021 14:02    MR BRAIN W WO CONTRAST  Result Date: 01/14/2021 CLINICAL DATA:  Seizure. EXAM: MRI HEAD WITHOUT AND WITH CONTRAST TECHNIQUE: Multiplanar, multiecho pulse sequences of the brain and surrounding structures were obtained without and with intravenous contrast. CONTRAST:  55mL GADAVIST GADOBUTROL 1 MMOL/ML IV SOLN COMPARISON:  CT head January 12, 2021.  MRI January 30, 2018. FINDINGS: Brain: Abnormal lobulated FLAIR hyperintense extra-axial fluid collection along bilateral frontal lobes inferiorly, measuring up to 13 mm in thickness on the right. These areas demonstrate irregular/wispy enhancement. Areas of susceptibility artifact and T1 hyperintensity within the collection, suggestive of hemorrhage. The collection also demonstrates restricted diffusion. Mild adjacent edema in the right frontal cortex without significant mass effect. Ill-defined/wispy T2/FLAIR hyperintensity in the right thalamus. Mild associated DWI hyperintensity in this region without definite ADC correlate. No significant mass effect. Small parenchymal focus of ill-defined T2/FLAIR hyperintensity in the posterior left cerebellum (series 7, images 4/5). No hydrocephalus. No midline shift. Basal cisterns are patent. Additional scattered T2/FLAIR hyperintensities are nonspecific, but most likely related to chronic microvascular ischemic disease. Atrophy and ex vacuo ventricular dilation, similar to prior. Vascular: Major arterial flow voids are maintained skull base. Skull and upper cervical spine: Normal marrow signal. Sinuses/Orbits: Clear sinuses. Other: No mastoid effusions. IMPRESSION: 1. Inferior bifrontal extra-axial fluid collections measuring up to 13 mm in thickness with wispy enhancement, restricted diffusion and evidence of hemorrhage. There is mild adjacent edema in the right frontal cortex without significant mass effect. Findings could be related to hemorrhage and/or infection/empyema.  No hyperdense blood products were seen on recent CT head  and repeat noncontrast head CT is recommended to evaluate for interval hyperdense hemorrhage in this location. Additionally, recommend lumbar puncture. 2. Ill-defined T2/FLAIR hyperintensity and mild DWI hyperintensity in the right thalamus, which is of unclear etiology but potentially related to the patient's reported seizure activity, encephalitis, or less likely infarct or low grade glioma. 3. Small parenchymal focus of ill-defined T2/FLAIR hyperintensity in the posterior left cerebellum, which could be posttraumatic or infectious. Seizure activity is a consideration, but this location is atypical. 4. Chronic microvascular ischemic disease. Findings and recommendations discussed with Dr. Lora Havens via telephone at 12:44 p.m. Electronically Signed   By: Margaretha Sheffield MD   On: 01/14/2021 12:57   CT CHEST ABDOMEN PELVIS W CONTRAST  Result Date: 01/15/2021 CLINICAL DATA:  Focal status epilepticus with no knee calls. Workup to evaluate for a possible paraneoplastic etiology. EXAM: CT CHEST, ABDOMEN, AND PELVIS WITH CONTRAST TECHNIQUE: Multidetector CT imaging of the chest, abdomen and pelvis was performed following the standard protocol during bolus administration of intravenous contrast. CONTRAST:  24mL OMNIPAQUE IOHEXOL 300 MG/ML  SOLN COMPARISON:  Portable chest dated yesterday. Abdomen and pelvis CT dated 02/10/2018. Abdomen MRI dated 04/07/2017. FINDINGS: CT CHEST FINDINGS Cardiovascular: Atheromatous calcifications, including the coronary arteries and aorta. Mediastinum/Nodes: No enlarged mediastinal, hilar, or axillary lymph nodes. Thyroid gland, trachea, and esophagus demonstrate no significant findings. Lungs/Pleura: Endotracheal tube in satisfactory position. Minimal bibasilar dependent atelectasis. Right lower lobe peribronchial soft tissue thickening and low density consolidation with mild improvement. Small amount of linear density in the right upper lobe on image number 47/5 with an  appearance suggesting scarring or peribronchial thickening. No masses or nodules suspicious for malignancy. No pleural fluid. Musculoskeletal: Stable approximately 60% T8 vertebral compression deformity and Schmorl's node formation. Interval approximately 20% T10 superior endplate compression deformity and Schmorl's node formation with sclerosis and minimal retropulsion. No acute fracture lines. No evidence of bony metastatic disease. CT ABDOMEN PELVIS FINDINGS Hepatobiliary: Mild diffuse low density of the liver relative to the spleen. Previously demonstrated small right lobe liver hemangioma. No new liver masses. Interval marked dilatation of the gallbladder with no visible gallstones and no wall thickening or pericholecystic fluid. Thin internal septations in the gallbladder neck region. No interval biliary ductal dilatation. Pancreas: Unremarkable. No pancreatic ductal dilatation or surrounding inflammatory changes. Spleen: Normal in size without focal abnormality. Adrenals/Urinary Tract: The urinary bladder and distal ureters are obscured by artifacts bilateral hip prostheses. A Foley catheter remains present in the visualized portion of the urinary bladder with no significant urine visible in the bladder. Bilateral extrarenal pelves have not changed significantly. No interval kidney masses. Stomach/Bowel: Nasogastric tube extending into the stomach. The stomach is mildly dilated. The right colon and rectum are also prominent and filled with stool fluid. No small bowel dilatation. No evidence of appendicitis. Vascular/Lymphatic: Atheromatous arterial calcifications without aneurysm. No enlarged lymph nodes. Reproductive: Not adequately assessed due to the streak artifacts in the pelvis from the hip prostheses. Other: Very small amount of free peritoneal fluid. Musculoskeletal: Bilateral hip prostheses chronic L1 through L3 vertebral compression deformities without significant change. No acute fractures seen. No  evidence of bony metastatic disease. IMPRESSION: 1. No evidence of malignancy in the chest, abdomen or pelvis. 2. Interval marked dilatation of the gallbladder with thin internal septation seen in the region of the gallbladder neck. 3.  Calcific coronary artery and aortic atherosclerosis. 4. Multiple old thoracic and lumbar spine vertebral  compression fractures and 1 interval fracture involving the T10 vertebral body with no acute fracture lines seen. 5. Mild diffuse hepatic steatosis. Electronically Signed   By: Claudie Revering M.D.   On: 01/15/2021 14:53   CT CEREBRAL PERFUSION W CONTRAST  Result Date: 01/06/2021 CLINICAL DATA:  Unresponsive. Possible seizure although focally weak on the left with neglect. EXAM: CT ANGIOGRAPHY HEAD AND NECK CT PERFUSION BRAIN TECHNIQUE: Multidetector CT imaging of the head and neck was performed using the standard protocol during bolus administration of intravenous contrast. Multiplanar CT image reconstructions and MIPs were obtained to evaluate the vascular anatomy. Carotid stenosis measurements (when applicable) are obtained utilizing NASCET criteria, using the distal internal carotid diameter as the denominator. Multiphase CT imaging of the brain was performed following IV bolus contrast injection. Subsequent parametric perfusion maps were calculated using RAPID software. CONTRAST:  Dose is not yet known on this in progress study COMPARISON:  Preceding head CT. FINDINGS: CTA NECK FINDINGS Aortic arch: Atheromatous plaque.  Three vessel branching. Right carotid system: Mild plaque at the bifurcation without stenosis or ulceration. ICA tortuosity with mild kinking. Left carotid system: No evidence of dissection, stenosis (50% or greater) or occlusion. Vertebral arteries: No proximal subclavian stenosis. Both vertebral arteries are patent to the dura. Skeleton: Chronic C2 bilateral pedicle fracture with C2-3 and C3-4 anterolisthesis. These findings were also reported on a 2017  cervical spine CT. The anterolisthesis has increased from 2017. Other neck: No emergent finding Upper chest: Minimal ground-glass density in the right upper lobe which is likely inflammatory. Review of the MIP images confirms the above findings CTA HEAD FINDINGS Anterior circulation: No significant stenosis, proximal occlusion, aneurysm, or vascular malformation. Posterior circulation: No significant stenosis, proximal occlusion, aneurysm, or vascular malformation. Venous sinuses: Non-opacified Anatomic variants: None significant Review of the MIP images confirms the above findings CT Brain Perfusion Findings: ASPECTS: 10 CBF (<30%) Volume: 34mL Perfusion (Tmax>6.0s) volume: 46mL Mismatch Volume: 17mL IMPRESSION: 1. No emergent large vessel occlusion or proximal flow limiting stenosis. 2. Ischemic CT perfusion values in the posterior right hemisphere but no correlative arterial stenosis or occlusion. 3. Mild atherosclerosis. Electronically Signed   By: Monte Fantasia M.D.   On: 01/24/2021 07:10   DG CHEST PORT 1 VIEW  Result Date: 01/22/2021 CLINICAL DATA:  Acute respiratory failure. EXAM: PORTABLE CHEST 1 VIEW COMPARISON:  CT chest/abdomen/pelvis 01/15/2021. Prior chest radiograph 01/14/2021 and earlier. FINDINGS: The ET tube terminates 4.1 cm above the level of the carina. An enteric tube passes below the level of left hemidiaphragm with tip projecting in the region of the stomach. A left-sided central venous catheter is unchanged in position with tip projecting in the region of the superior cavoatrial junction. Heart size within normal limits. Aortic atherosclerosis. No appreciable airspace consolidation or pulmonary edema. No evidence of pleural effusion or pneumothorax. No acute bony abnormality identified. Redemonstrated chronic rib fracture deformities and multilevel vertebral compression deformities. Partially imaged hardware within the left humeral head. IMPRESSION: Line and tube position, as described.  No appreciable airspace consolidation or pulmonary edema. Aortic Atherosclerosis (ICD10-I70.0). Electronically Signed   By: Kellie Simmering DO   On: 01/22/2021 08:50   DG CHEST PORT 1 VIEW  Result Date: 01/14/2021 CLINICAL DATA:  Central catheter placement.  Hypoxia. EXAM: PORTABLE CHEST 1 VIEW COMPARISON:  January 13, 2021 FINDINGS: Endotracheal tube tip is 3.3 cm above carina. Nasogastric tube tip and side port in stomach. Central catheter tip in superior vena cava. No pneumothorax. There is slight atelectasis. Lungs  elsewhere clear. Heart size and pulmonary vascularity are normal. No adenopathy. No bone lesions. IMPRESSION: Tube positions as described without pneumothorax. Slight bibasilar atelectasis. Lungs otherwise clear. Heart size normal. Electronically Signed   By: Lowella Grip III M.D.   On: 01/14/2021 09:54   DG CHEST PORT 1 VIEW  Result Date: 01/13/2021 CLINICAL DATA:  History of seizures. EXAM: PORTABLE CHEST 1 VIEW COMPARISON:  01/19/2021.  CT 02/05/2018. FINDINGS: Endotracheal tube tip has been retracted and is 4 cm above the carina in good anatomic position. NG tube, left IJ line in stable position. Heart size stable. Mild right base subsegmental atelectasis. No pleural effusion or pneumothorax. Degenerative changes thoracic spine. Old midthoracic vertebral body compression fracture again noted. Old left rib fractures. Postsurgical changes of the left clavicle again noted. IMPRESSION: 1. Endotracheal tube is been retracted and is 4 cm above the carina in good anatomic position. NG tube and left IJ line stable position. 2. Mild left base subsegmental atelectasis. Electronically Signed   By: Marcello Moores  Register   On: 01/13/2021 05:18   DG CHEST PORT 1 VIEW  Result Date: 01/08/2021 CLINICAL DATA:  Intubation, orogastric tube, central line placement. EXAM: PORTABLE CHEST 1 VIEW COMPARISON:  Radiograph earlier today. FINDINGS: The endotracheal tube tip is low in positioning 8 mm from the carina. Tip  and side port of the enteric tube below the diaphragm in the stomach. Left internal jugular central venous catheter tip overlies the mid SVC. No evidence of pneumothorax. Normal heart size with stable mediastinal contours. Aortic atherosclerosis. Low lung volumes with increasing bibasilar atelectasis. Remote right rib fractures. IMPRESSION: 1. Endotracheal tube tip low in positioning 8 mm from the carina. Recommend retraction of 1-2 cm for optimal placement. 2. Tip and side port of the enteric tube below the diaphragm in the stomach. 3. Left internal jugular central venous catheter tip in the mid SVC. No pneumothorax. 4. Developing bibasilar atelectasis. Electronically Signed   By: Keith Rake M.D.   On: 01/06/2021 23:07   DG CHEST PORT 1 VIEW  Result Date: 01/22/2021 CLINICAL DATA:  Seizure EXAM: PORTABLE CHEST 1 VIEW COMPARISON:  02/08/2018 FINDINGS: Heart is normal size. Lungs clear. Aortic calcifications. No effusions or acute bony abnormality. IMPRESSION: No active disease. Electronically Signed   By: Rolm Baptise M.D.   On: 01/18/2021 11:27   DG Abd Portable 1V  Result Date: 01/22/2021 CLINICAL DATA:  Feeding tube placement. EXAM: PORTABLE ABDOMEN - 1 VIEW COMPARISON:  None. FINDINGS: Feeding tube is in place with the tip in the distal most stomach directed toward the duodenum. IMPRESSION: As above. Electronically Signed   By: Inge Rise M.D.   On: 01/22/2021 13:49   EEG adult  Result Date: 02/01/2021 Lora Havens, MD     01/22/2021  3:46 PM Patient Name: Dayanara Sherrill Atkins: [id number] Epilepsy Attending: Lora Havens Referring Physician/Provider: Dr. Su Monks Date: 01/11/2021 Duration: 24.02 minutes Patient history: 70 year old Atkins presented with focal convulsive status epilepticus.  EEG to evaluate for seizures. Level of alertness: Awake AEDs during EEG study: Ativan, Keppra, phenobarbital Technical aspects: This EEG study was done with scalp electrodes positioned according to  the 10-20 International system of electrode placement. Electrical activity was acquired at a sampling rate of 500Hz  and reviewed with a high frequency filter of 70Hz  and a low frequency filter of 1Hz . EEG data were recorded continuously and digitally stored. Description: Patient was noted to have left face and arm rhythmic twitching. Concomitant EEG showed focal convulsive  status epilepticus arising from right centro-temporo-parietal region.  EEG also showed continuous generalized 3 to 5 Hz theta-delta slowing.  Hyperventilation and photic stimulation were not performed.   ABNORMALITY -Focal convulsive status epilepticus,right centro-temporo-parietal region. -Continuous slow, generalized IMPRESSION: This study showed evidence of focal convulsive status epilepticus arising from right centro-temporo-parietal region during which patient was noted to have left face and arm rhythmic twitching.  Additionally, there is evidence of moderate to severe diffuse encephalopathy likely related to seizures. Priyanka [name]   Overnight EEG with video  Result Date: 01/13/2021 Lora Havens, MD     01/13/2021  1:33 PM Patient Name: Zakia Sainato Atkins: [id number] Epilepsy Attending: Lora Havens Referring Physician/Provider: Dr. Su Monks Duration: 02/02/2021 1347 to 01/13/2021 1347   Patient history: 70 year old Atkins presented with focal convulsive status epilepticus.  EEG to evaluate for seizures.   Level of alertness: Awake, asleep/sedated   AEDs during EEG study: Ativan, Keppra, phenobarbital, PHT, LCM, Propofol, versed   Technical aspects: This EEG study was done with scalp electrodes positioned according to the 10-20 International system of electrode placement. Electrical activity was acquired at a sampling rate of 500Hz  and reviewed with a high frequency filter of 70Hz  and a low frequency filter of 1Hz . EEG data were recorded continuously and digitally stored.   Description: Patient was initially noted to have left face  and arm rhythmic twitching. Concomitant EEG showed focal convulsive status epilepticus arising from right centro-temporo-parietal region. EEG also showed continuous generalized 3 to 5 Hz theta-delta slowing.  Multiple AEDs were added after which clinical seizures stopped.  However there was no significant change in EEG. Eventually propofol was started around 2130 after which EEG showed brief 1 to 3 seconds of generalized EEG attenuation.  As sedation was titrated, the periods of EEG attenuation became longer lasting around 2 to 5 seconds. EEG continued to show highly epileptiform bursts in right centro- temporo-parietal region as well as polymorphic mixed frequencies with predominantly 5 to 8 Hz theta-alpha activity.   ABNORMALITY -Focal non-convulsive status epilepticus,right centro-temporo-parietal region. -Burst suppression with highly epileptiform bursts, ,right centro-temporo-parietal region. 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 Additionally, there is severe to profound diffuse encephalopathy likely related to seizures, sedation.     Lora Havens    CT HEAD CODE STROKE WO CONTRAST  Result Date: 01/16/2021 CLINICAL DATA:  Code stroke.  Left-sided paralysis EXAM: CT HEAD WITHOUT CONTRAST TECHNIQUE: Contiguous axial images were obtained from the base of the skull through the vertex without intravenous contrast. COMPARISON:  01/28/2018 FINDINGS: Brain: No evidence of acute infarction, hemorrhage, hydrocephalus, extra-axial collection or mass lesion/mass effect. Generalized atrophy and periventricular chronic small vessel ischemia. Chronic appearing lacune at the posterior left putamen. Vascular: No focal hyperdense vessel. Skull: Normal. Negative for fracture or focal lesion. Sinuses/Orbits: No acute finding. Other: These results were communicated to Dr Lorrin Goodell at 6:46 amon 06/01/2022by text page via the [facility]  messaging system. ASPECTS [facility] Stroke Program Early CT Score) - Ganglionic level infarction (caudate, lentiform nuclei, internal capsule, insula, M1-M3 cortex): 7 - Supraganglionic infarction (M4-M6 cortex): 3 Total score (0-10 with 10 being normal): 10 IMPRESSION: 1. No acute finding. 2. Generalized atrophy and chronic small vessel ischemia. Electronically Signed   By: Monte Fantasia M.D.   On: 02/04/2021 06:47   VAS [place] UPPER EXTREMITY VENOUS DUPLEX  Result Date: 01/18/2021 UPPER VENOUS STUDY  Patient Name:  ALITZEL ANN Overdorf  Date of Exam:  01/17/2021 Medical Rec #: [id number]    Accession #:    [id number] Date of Birth: 07-Nov-1950     Patient Gender: F Patient Age:   79Y Exam Location:  [facility] Procedure:      VAS [place] UPPER EXTREMITY VENOUS DUPLEX Referring Phys: [id number] [place] --------------------------------------------------------------------------------  Indications: Swelling, and Erythema Limitations: Line in left neck and continuous EEG, patient on ventilator, unable to adequately rotate arm. Comparison Study: No prior study on file Performing Technologist: Sharion Dove RVS  Examination Guidelines: A complete evaluation includes B-mode imaging, spectral Doppler, color Doppler, and power Doppler as needed of all accessible portions of each vessel. Bilateral testing is considered an integral part of a complete examination. Limited examinations for reoccurring indications may be performed as noted.  Left Findings: +----------+------------+---------+-----------+----------+--------------+ LEFT      CompressiblePhasicitySpontaneousProperties   Summary     +----------+------------+---------+-----------+----------+--------------+ IJV                                                 Not visualized +----------+------------+---------+-----------+----------+--------------+ Subclavian                                          Not visualized  +----------+------------+---------+-----------+----------+--------------+ Axillary                 Yes       Yes                             +----------+------------+---------+-----------+----------+--------------+ Brachial      Full                                                 +----------+------------+---------+-----------+----------+--------------+ Radial        Full                                                 +----------+------------+---------+-----------+----------+--------------+ Ulnar         Full                                                 +----------+------------+---------+-----------+----------+--------------+ Cephalic      Full                                                 +----------+------------+---------+-----------+----------+--------------+ Basilic       Full                                                 +----------+------------+---------+-----------+----------+--------------+  Summary:  Left: No evidence of deep vein  thrombosis in the upper extremity. No evidence of superficial vein thrombosis in the upper extremity.  *See table(s) above for measurements and observations.  Diagnosing physician: Jamelle Haring Electronically signed by Jamelle Haring on 01/18/2021 at 10:48:24 AM.    Final    CT ANGIO HEAD CODE STROKE  Result Date: 01/31/2021 CLINICAL DATA:  Unresponsive. Possible seizure although focally weak on the left with neglect. EXAM: CT ANGIOGRAPHY HEAD AND NECK CT PERFUSION BRAIN TECHNIQUE: Multidetector CT imaging of the head and neck was performed using the standard protocol during bolus administration of intravenous contrast. Multiplanar CT image reconstructions and MIPs were obtained to evaluate the vascular anatomy. Carotid stenosis measurements (when applicable) are obtained utilizing NASCET criteria, using the distal internal carotid diameter as the denominator. Multiphase CT imaging of the brain was performed following IV bolus  contrast injection. Subsequent parametric perfusion maps were calculated using RAPID software. CONTRAST:  Dose is not yet known on this in progress study COMPARISON:  Preceding head CT. FINDINGS: CTA NECK FINDINGS Aortic arch: Atheromatous plaque.  Three vessel branching. Right carotid system: Mild plaque at the bifurcation without stenosis or ulceration. ICA tortuosity with mild kinking. Left carotid system: No evidence of dissection, stenosis (50% or greater) or occlusion. Vertebral arteries: No proximal subclavian stenosis. Both vertebral arteries are patent to the dura. Skeleton: Chronic C2 bilateral pedicle fracture with C2-3 and C3-4 anterolisthesis. These findings were also reported on a 2017 cervical spine CT. The anterolisthesis has increased from 2017. Other neck: No emergent finding Upper chest: Minimal ground-glass density in the right upper lobe which is likely inflammatory. Review of the MIP images confirms the above findings CTA HEAD FINDINGS Anterior circulation: No significant stenosis, proximal occlusion, aneurysm, or vascular malformation. Posterior circulation: No significant stenosis, proximal occlusion, aneurysm, or vascular malformation. Venous sinuses: Non-opacified Anatomic variants: None significant Review of the MIP images confirms the above findings CT Brain Perfusion Findings: ASPECTS: 10 CBF (<30%) Volume: 8mL Perfusion (Tmax>6.0s) volume: 55mL Mismatch Volume: 33mL IMPRESSION: 1. No emergent large vessel occlusion or proximal flow limiting stenosis. 2. Ischemic CT perfusion values in the posterior right hemisphere but no correlative arterial stenosis or occlusion. 3. Mild atherosclerosis. Electronically Signed   By: Monte Fantasia M.D.   On: 02/02/2021 07:10   CT ANGIO NECK CODE STROKE  Result Date: 01/22/2021 CLINICAL DATA:  Unresponsive. Possible seizure although focally weak on the left with neglect. EXAM: CT ANGIOGRAPHY HEAD AND NECK CT PERFUSION BRAIN TECHNIQUE: Multidetector  CT imaging of the head and neck was performed using the standard protocol during bolus administration of intravenous contrast. Multiplanar CT image reconstructions and MIPs were obtained to evaluate the vascular anatomy. Carotid stenosis measurements (when applicable) are obtained utilizing NASCET criteria, using the distal internal carotid diameter as the denominator. Multiphase CT imaging of the brain was performed following IV bolus contrast injection. Subsequent parametric perfusion maps were calculated using RAPID software. CONTRAST:  Dose is not yet known on this in progress study COMPARISON:  Preceding head CT. FINDINGS: CTA NECK FINDINGS Aortic arch: Atheromatous plaque.  Three vessel branching. Right carotid system: Mild plaque at the bifurcation without stenosis or ulceration. ICA tortuosity with mild kinking. Left carotid system: No evidence of dissection, stenosis (50% or greater) or occlusion. Vertebral arteries: No proximal subclavian stenosis. Both vertebral arteries are patent to the dura. Skeleton: Chronic C2 bilateral pedicle fracture with C2-3 and C3-4 anterolisthesis. These findings were also reported on a 2017 cervical spine CT. The anterolisthesis has increased from 2017. Other neck: No  emergent finding Upper chest: Minimal ground-glass density in the right upper lobe which is likely inflammatory. Review of the MIP images confirms the above findings CTA HEAD FINDINGS Anterior circulation: No significant stenosis, proximal occlusion, aneurysm, or vascular malformation. Posterior circulation: No significant stenosis, proximal occlusion, aneurysm, or vascular malformation. Venous sinuses: Non-opacified Anatomic variants: None significant Review of the MIP images confirms the above findings CT Brain Perfusion Findings: ASPECTS: 10 CBF (<30%) Volume: 74mL Perfusion (Tmax>6.0s) volume: 72mL Mismatch Volume: 42mL IMPRESSION: 1. No emergent large vessel occlusion or proximal flow limiting stenosis. 2.  Ischemic CT perfusion values in the posterior right hemisphere but no correlative arterial stenosis or occlusion. 3. Mild atherosclerosis. Electronically Signed   By: Monte Fantasia M.D.   On: 01/31/2021 07:10    Microbiology No results found for this or any previous visit (from the past 240 hour(s)).  Lab Basic Metabolic Panel: Recent Labs  Lab 01/22/21 0759 01/23/21 0447 01/24/21 0856 01/26/21 0216  NA 136 136 136 144  K 3.6 4.0 3.6 3.6  CL 100 100 101 107  CO2 28 27 26 28   GLUCOSE 138* 108* 146* 119*  BUN 20 20 24* 44*  CREATININE 0.39* 0.40* 0.44 0.55  CALCIUM 8.3* 7.9* 8.2* 8.8*   Liver Function Tests: No results for input(s): AST, ALT, ALKPHOS, BILITOT, PROT, ALBUMIN in the last 168 hours. No results for input(s): LIPASE, AMYLASE in the last 168 hours. No results for input(s): AMMONIA in the last 168 hours. CBC: Recent Labs  Lab 01/22/21 0759 01/23/21 0447 01/26/21 0216  WBC 11.3* 13.3* 15.0*  NEUTROABS 9.3*  --   --   HGB 10.4* 10.3* 9.2*  HCT 31.1* 30.5* 28.8*  MCV 103.7* 102.0* 106.3*  PLT 184 215 316   Cardiac Enzymes: No results for input(s): CKTOTAL, CKMB, CKMBINDEX, TROPONINI in the last 168 hours. Sepsis Labs: Recent Labs  Lab 01/22/21 0759 01/23/21 0447 01/26/21 0216  WBC 11.3* 13.3* 15.0*    Procedures/Operations  Intubation LP Central line placement   Julian Hy Feb 14, 2021, 10:04 AM

## 2021-02-05 NOTE — Progress Notes (Signed)
[facility] Progress Note Patient Name: Samantha Atkins DOB: 1951-08-01 MRN: [id number]   Date of Service  02/01/21  HPI/Events of Note  Hypotension = BP = 78/52 post Fentanyl IV.   eICU Interventions  Plan: D/C Fentanyl IV. 25% Albumin 12.5 gm IV now.      Intervention Category Major Interventions: Hypotension - evaluation and management  Selda Jalbert Eugene 02/01/2021, 3:26 AM

## 2021-02-05 NOTE — Progress Notes (Signed)
Patient status has been deteriorating all night, MD made aware as BP continued to drop. Patient was last assessed around 0330 and still had gag & cough. She was also responding to pain with a flicker of muscle. Around 0430 it was found that the patient no longer had a gag reflex. Patients reaction to pain is also much weaker/almost absent. Patient is still breathing over the vent. Elink was called and made aware of the changes. The family does not wish for escalation of care. Pt family notified of changes.

## 2021-02-05 DEATH — deceased

## 2021-03-02 NOTE — Procedures (Signed)
ELECTROENCEPHALOGRAM REPORT  Dates of Recording: 01/11/2021 9:26AM to 01/26/2021 1:16pm PM  Patient's Name: Samantha Atkins MRN: [id number] Date of Birth: 03/31/1951  Referring Provider: Dr. Ellouise Newer  Procedure: 27:57-hour ambulatory video EEG  History: This is a 70 year old woman with a history of status epilepticus in 2019, seizure-free until recently when she called to report daily seizures. EEG for classification.   Medications: Lyrica Phenobarbital Keppra Fetzima Relpax  Technical Summary: This is a 27:57-hour multichannel digital video EEG recording measured by the international 10-20 system with electrodes applied with paste and impedances below 5000 ohms performed as portable with EKG monitoring.  The digital EEG was referentially recorded, reformatted, and digitally filtered in a variety of bipolar and referential montages for optimal display.    DESCRIPTION OF RECORDING: At the beginning of the recording, the background is disorganized with high voltage 4-6 Hz theta slowing. There is occasional focal 2 Hz delta slowing seen over the left temporal region. During drowsiness and sleep, the background becomes more organized with decreased voltage and occasional left temporal sharp waves seen.   On 6/7 at 0527 hours, rhythmic 5-6 Hz activity starts over the right parietal region that spreads diffusely within 40 seconds when the patient has a generalized tonic-clonic seizure with left arm flexed and right arm extended for 120 seconds. This is followed by periodic discharges maximal over the right posterior region as the patient is unresponsive on bed. Electrographic seizure maximal over the right hemisphere continues until she has another generalized tonic-clonic seizure at 0544 hours lasting 7 minutes. EMS arrives and transports her out of bed at 0554 hours. EEG recording continues with recurrent electrographic seizures arising from the right hemisphere and another convulsion at 1129 hours.  EEG discontinued at 1316 hours.   IMPRESSION: This 27-hour ambulatory video EEG study is abnormal due to the presence of: Disorganized background with high voltage activity Occasional left temporal focal slowing Occasional left temporal epileptiform discharges Status epilepticus that arising from the right posterior region     Ellouise Newer, M.D.

## 2021-05-10 ENCOUNTER — Ambulatory Visit: Payer: Managed Care, Other (non HMO) | Admitting: Neurology

## 2023-04-10 IMAGING — CT CT CHEST-ABD-PELV W/ CM
2 of 5 series · 13 of 46 positions shown, 15 images · IV contrast (omnipaque)
Comparison: Portable chest dated yesterday. Abdomen and pelvis CT
dated 02/10/2018. Abdomen MRI dated 04/07/2017.

CLINICAL DATA: Focal status epilepticus with no knee calls. Workup
to evaluate for a possible paraneoplastic etiology.

EXAM:
CT CHEST, ABDOMEN, AND PELVIS WITH CONTRAST
TECHNIQUE: Multidetector CT imaging of the chest, abdomen and pelvis was
performed following the standard protocol during bolus
administration of intravenous contrast.
CONTRAST:  75mL OMNIPAQUE IOHEXOL 300 MG/ML  SOLN

[Series 3: cap with · axial · 0.67mm/px · z∈[-448,+37]mm · 10 of 117 slices shown, 12 images]
[im 10/117  soft-tissue]
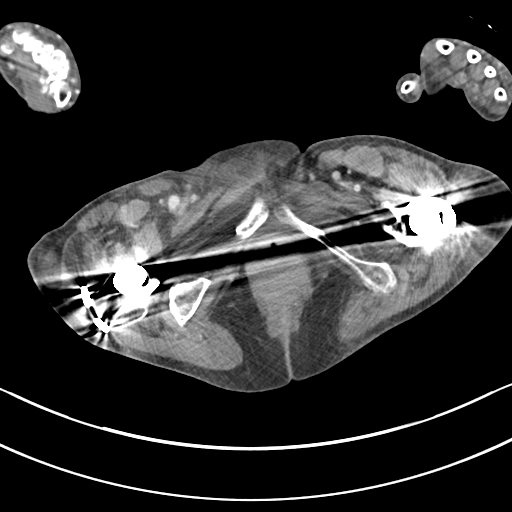
[im 10/117  bone]
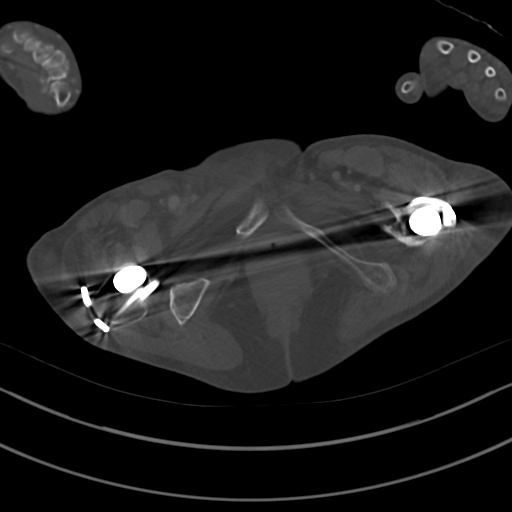
[im 20/117  soft-tissue]
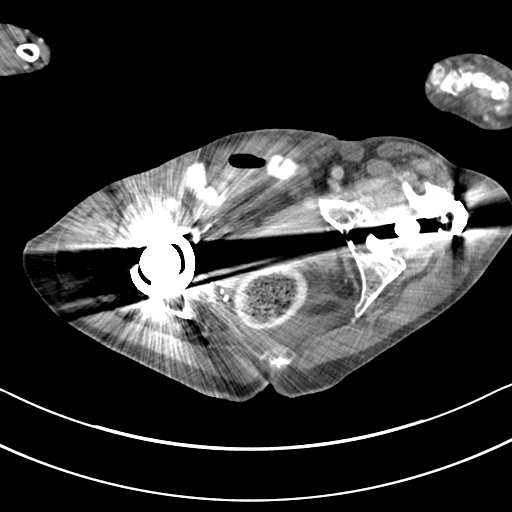
[im 30/117  soft-tissue]
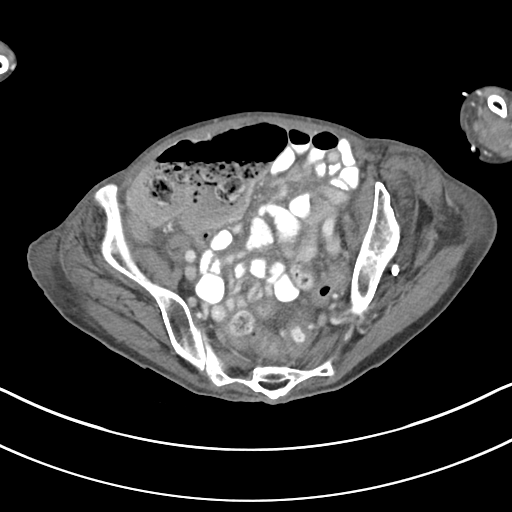
[im 39/117  soft-tissue]
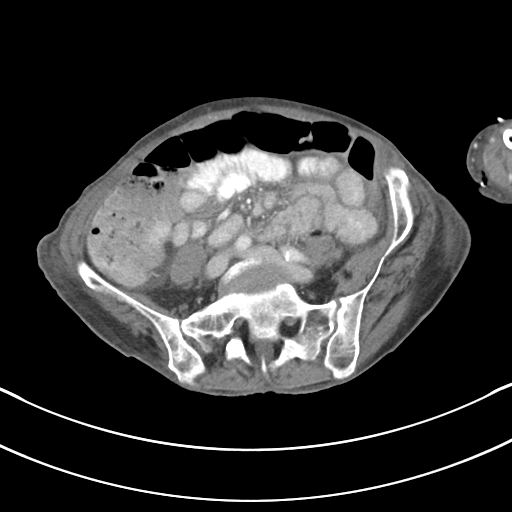
[im 49/117  soft-tissue]
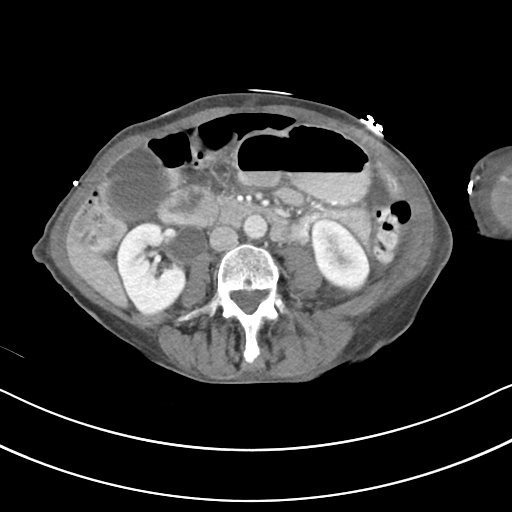
[im 68/117  soft-tissue]
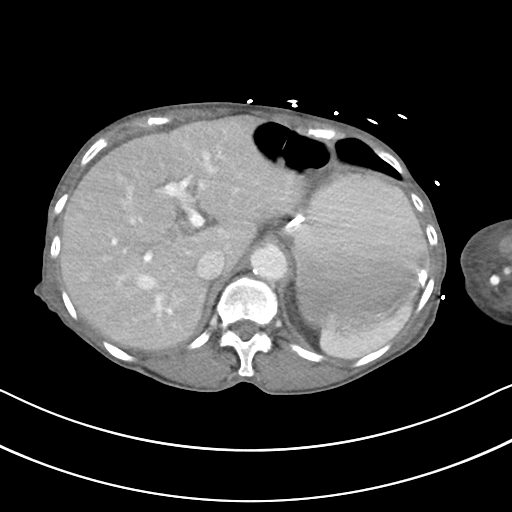
[im 78/117  soft-tissue]
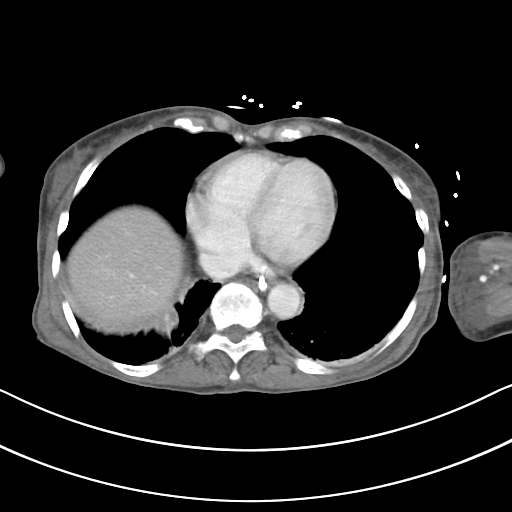
[im 88/117  soft-tissue]
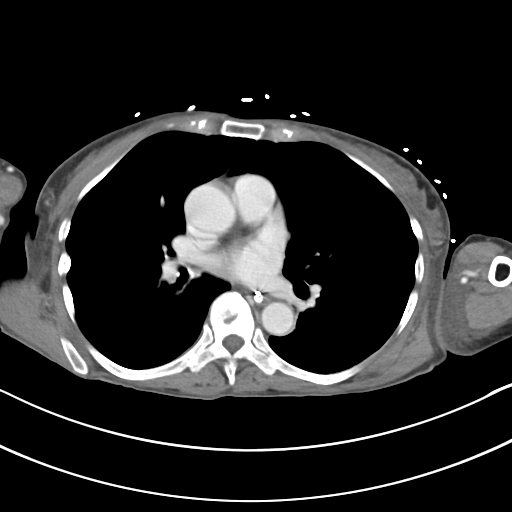
[im 97/117  soft-tissue]
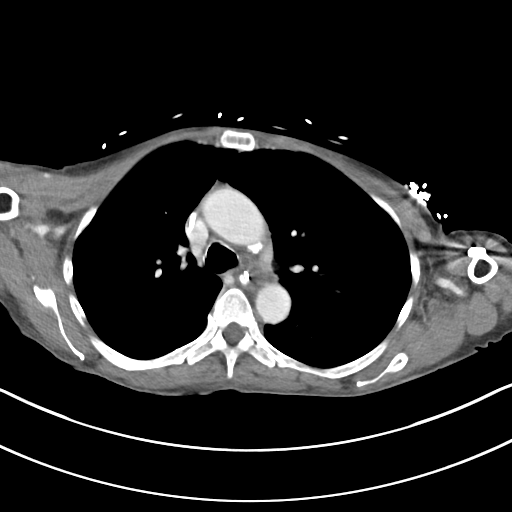
[im 97/117  bone]
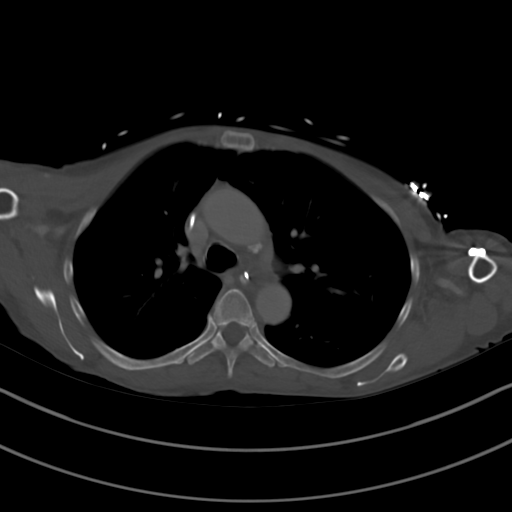
[im 107/117  soft-tissue]
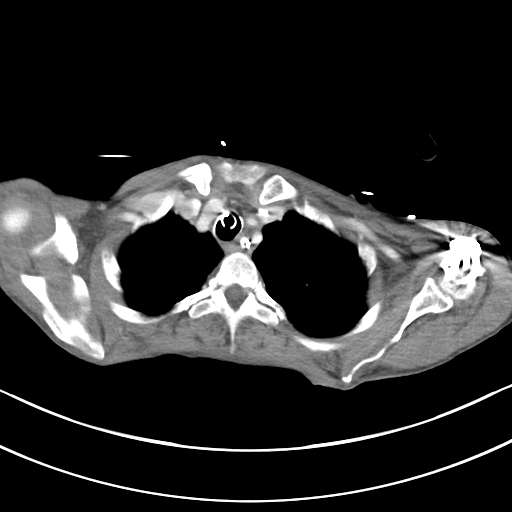

[Series 6: cor · coronal · 0.69mm/px · 3 of 77 slices shown]
[im 26/77  soft-tissue]
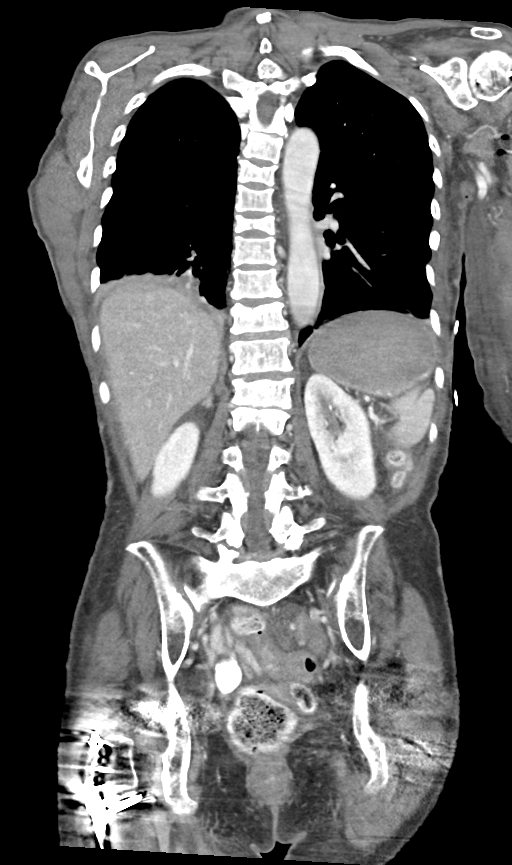
[im 34/77  soft-tissue]
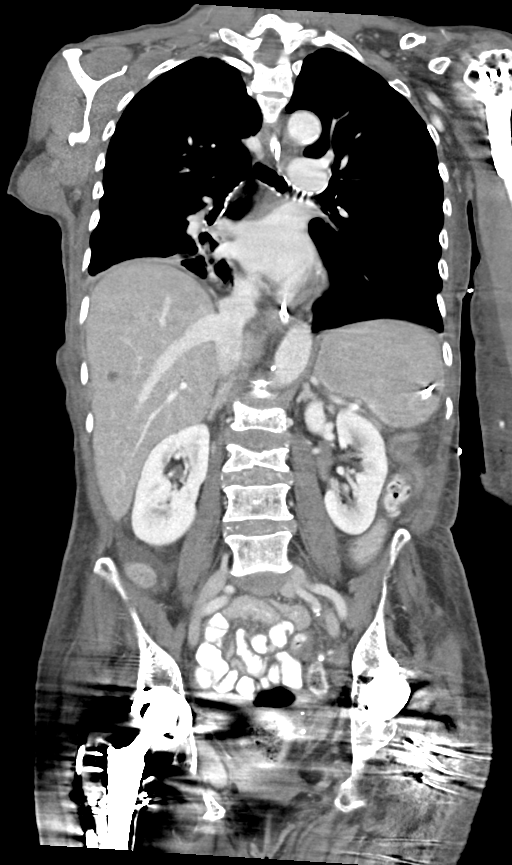
[im 43/77  soft-tissue]
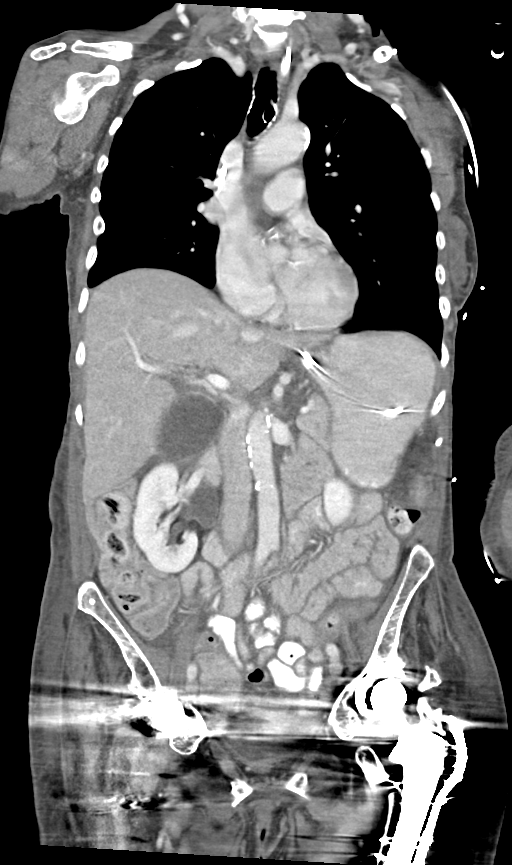

[13 of 46 positions shown; findings below may reference images not displayed]

FINDINGS: CT CHEST FINDINGS

Cardiovascular: Atheromatous calcifications, including the coronary
arteries and aorta.

Mediastinum/Nodes: No enlarged mediastinal, hilar, or axillary lymph
nodes. Thyroid gland, trachea, and esophagus demonstrate no
significant findings.

Lungs/Pleura: Endotracheal tube in satisfactory position. Minimal
bibasilar dependent atelectasis. Right lower lobe peribronchial soft
tissue thickening and low density consolidation with mild
improvement. Small amount of linear density in the right upper lobe
on image number 47/5 with an appearance suggesting scarring or
peribronchial thickening. No masses or nodules suspicious for
malignancy. No pleural fluid.

Musculoskeletal: Stable approximately 60% T8 vertebral compression
deformity and Schmorl's node formation. Interval approximately 20%
T10 superior endplate compression deformity and Schmorl's node
formation with sclerosis and minimal retropulsion. No acute fracture
lines. No evidence of bony metastatic disease.

CT ABDOMEN PELVIS FINDINGS

Hepatobiliary: Mild diffuse low density of the liver relative to the
spleen. Previously demonstrated small right lobe liver hemangioma.
No new liver masses.

Interval marked dilatation of the gallbladder with no visible
gallstones and no wall thickening or pericholecystic fluid. Thin
internal septations in the gallbladder neck region. No interval
biliary ductal dilatation.

Pancreas: Unremarkable. No pancreatic ductal dilatation or
surrounding inflammatory changes.

Spleen: Normal in size without focal abnormality.

Adrenals/Urinary Tract: The urinary bladder and distal ureters are
obscured by artifacts bilateral hip prostheses. A Foley catheter
remains present in the visualized portion of the urinary bladder
with no significant urine visible in the bladder. Bilateral
extrarenal pelves have not changed significantly. No interval kidney
masses.

Stomach/Bowel: Nasogastric tube extending into the stomach. The
stomach is mildly dilated. The right colon and rectum are also
prominent and filled with stool fluid. No small bowel dilatation. No
evidence of appendicitis.

Vascular/Lymphatic: Atheromatous arterial calcifications without
aneurysm. No enlarged lymph nodes.

Reproductive: Not adequately assessed due to the streak artifacts in
the pelvis from the hip prostheses.

Other: Very small amount of free peritoneal fluid.

Musculoskeletal: Bilateral hip prostheses chronic L1 through L3
vertebral compression deformities without significant change. No
acute fractures seen. No evidence of bony metastatic disease.
IMPRESSION: 1. No evidence of malignancy in the chest, abdomen or pelvis.
2. Interval marked dilatation of the gallbladder with thin internal
septation seen in the region of the gallbladder neck.
3.  Calcific coronary artery and aortic atherosclerosis.
4. Multiple old thoracic and lumbar spine vertebral compression
fractures and 1 interval fracture involving the T10 vertebral body
with no acute fracture lines seen.
5. Mild diffuse hepatic steatosis.

## 2023-04-17 IMAGING — DX DG CHEST 1V PORT
1 series · 1 of 1 positions shown · non-contrast
Comparison: CT chest/abdomen/pelvis 01/15/2021. Prior chest
radiograph 01/14/2021 and earlier.

CLINICAL DATA: Acute respiratory failure.

EXAM:
PORTABLE CHEST 1 VIEW

[chest]
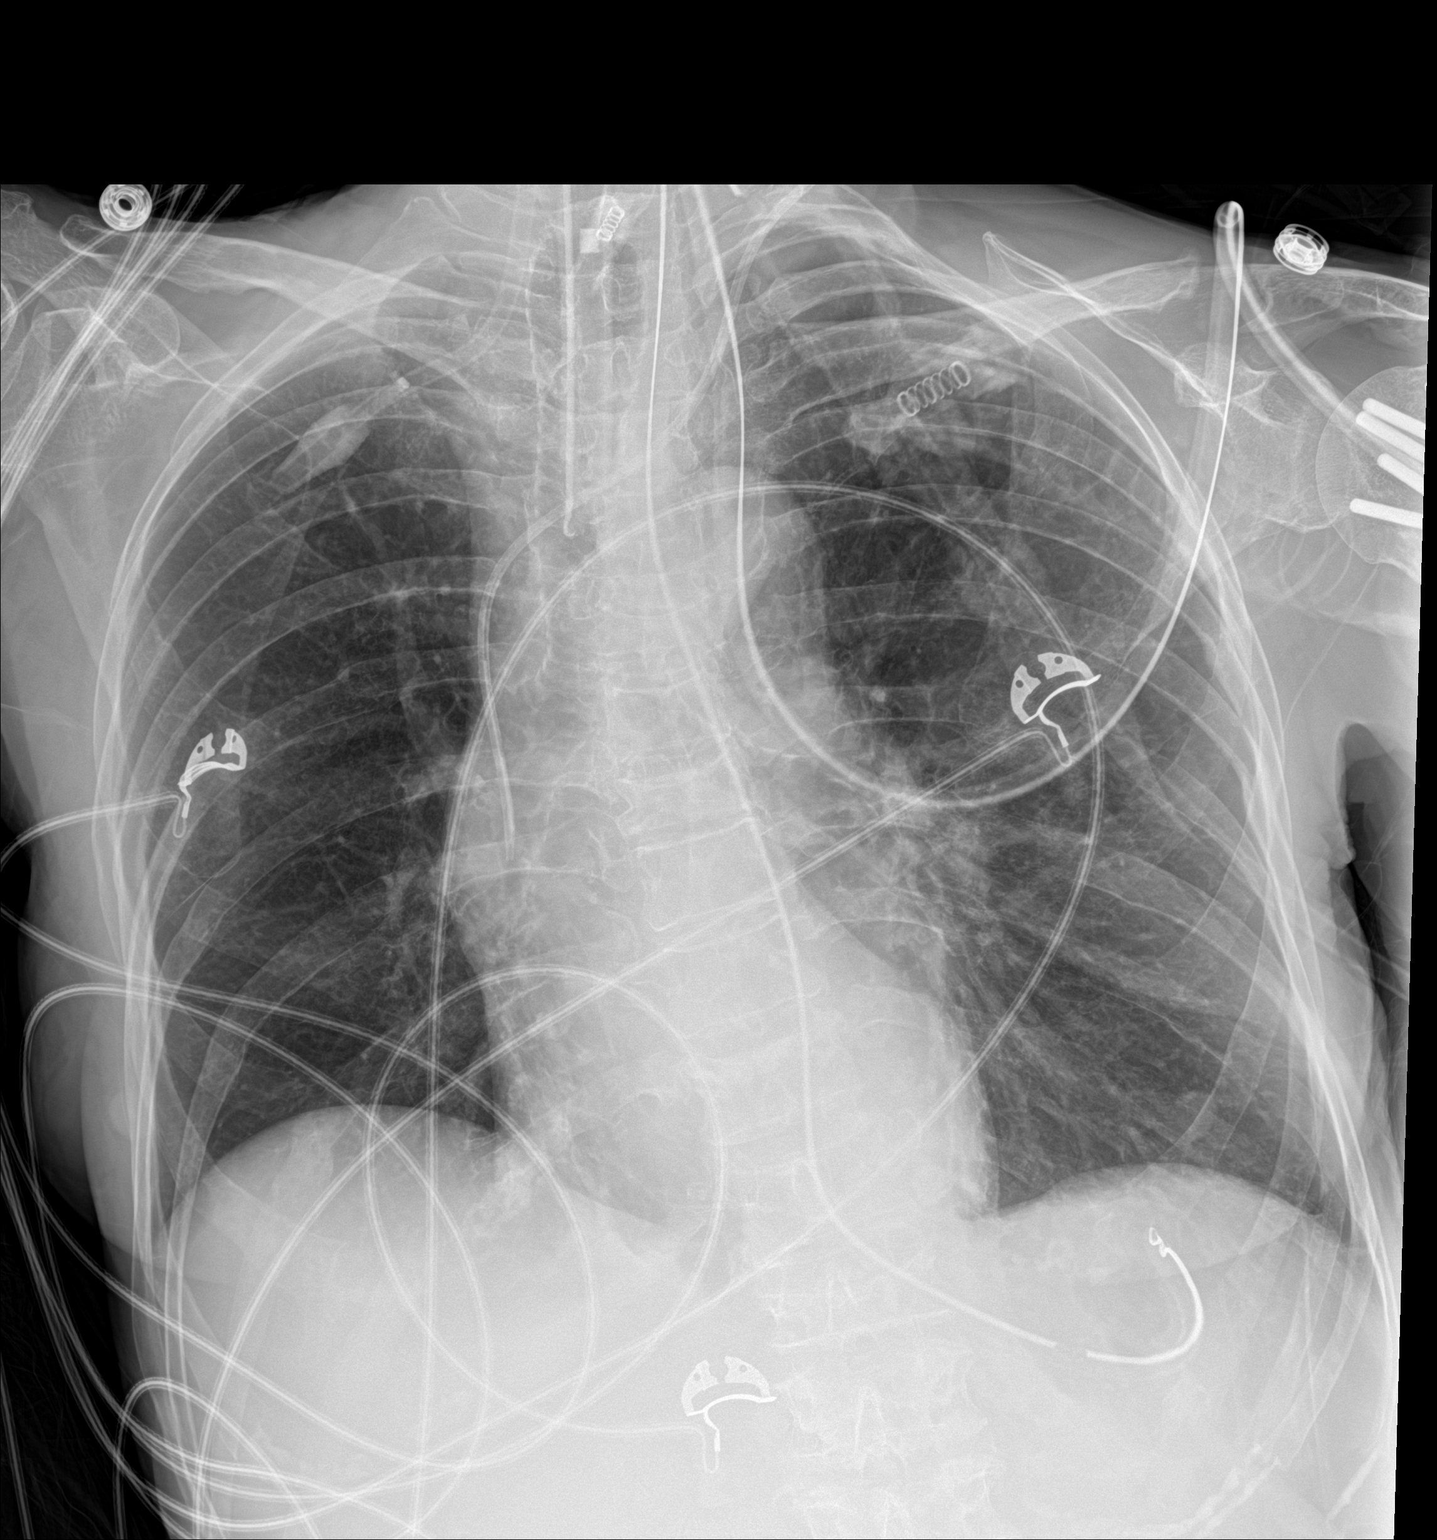

[1 of 1 positions shown; findings below may reference images not displayed]

FINDINGS: The ET tube terminates 4.1 cm above the level of the carina. An
enteric tube passes below the level of left hemidiaphragm with tip
projecting in the region of the stomach. A left-sided central venous
catheter is unchanged in position with tip projecting in the region
of the superior cavoatrial junction.

Heart size within normal limits. Aortic atherosclerosis. No
appreciable airspace consolidation or pulmonary edema. No evidence
of pleural effusion or pneumothorax. No acute bony abnormality
identified. Redemonstrated chronic rib fracture deformities and
multilevel vertebral compression deformities. Partially imaged
hardware within the left humeral head.
IMPRESSION: Line and tube position, as described.

No appreciable airspace consolidation or pulmonary edema.

Aortic Atherosclerosis ([id number]).
